# Patient Record
Sex: Female | Born: 1935 | Race: White | Hispanic: No | Smoking: Former smoker
Health system: Southern US, Community
[De-identification: ages and names within clinical notes are randomized; demographics above are authoritative.]

## PROBLEM LIST (undated history)

## (undated) DIAGNOSIS — G44009 Cluster headache syndrome, unspecified, not intractable: Secondary | ICD-10-CM

## (undated) DIAGNOSIS — M1990 Unspecified osteoarthritis, unspecified site: Secondary | ICD-10-CM

## (undated) DIAGNOSIS — I10 Essential (primary) hypertension: Secondary | ICD-10-CM

## (undated) DIAGNOSIS — C50919 Malignant neoplasm of unspecified site of unspecified female breast: Secondary | ICD-10-CM

## (undated) DIAGNOSIS — E785 Hyperlipidemia, unspecified: Secondary | ICD-10-CM

## (undated) DIAGNOSIS — M545 Low back pain, unspecified: Secondary | ICD-10-CM

## (undated) DIAGNOSIS — E669 Obesity, unspecified: Secondary | ICD-10-CM

## (undated) DIAGNOSIS — M4800 Spinal stenosis, site unspecified: Secondary | ICD-10-CM

## (undated) DIAGNOSIS — F329 Major depressive disorder, single episode, unspecified: Secondary | ICD-10-CM

## (undated) DIAGNOSIS — M8580 Other specified disorders of bone density and structure, unspecified site: Secondary | ICD-10-CM

## (undated) DIAGNOSIS — Z9181 History of falling: Secondary | ICD-10-CM

## (undated) DIAGNOSIS — M797 Fibromyalgia: Secondary | ICD-10-CM

## (undated) DIAGNOSIS — N189 Chronic kidney disease, unspecified: Secondary | ICD-10-CM

## (undated) DIAGNOSIS — K589 Irritable bowel syndrome without diarrhea: Secondary | ICD-10-CM

## (undated) DIAGNOSIS — I350 Nonrheumatic aortic (valve) stenosis: Secondary | ICD-10-CM

## (undated) DIAGNOSIS — D649 Anemia, unspecified: Secondary | ICD-10-CM

## (undated) DIAGNOSIS — F32A Depression, unspecified: Secondary | ICD-10-CM

## (undated) DIAGNOSIS — E538 Deficiency of other specified B group vitamins: Secondary | ICD-10-CM

## (undated) DIAGNOSIS — I679 Cerebrovascular disease, unspecified: Secondary | ICD-10-CM

## (undated) DIAGNOSIS — I4892 Unspecified atrial flutter: Secondary | ICD-10-CM

## (undated) HISTORY — DX: Spinal stenosis, site unspecified: M48.00

## (undated) HISTORY — DX: Depression, unspecified: F32.A

## (undated) HISTORY — DX: Unspecified osteoarthritis, unspecified site: M19.90

## (undated) HISTORY — DX: Low back pain: M54.5

## (undated) HISTORY — DX: Cluster headache syndrome, unspecified, not intractable: G44.009

## (undated) HISTORY — DX: Major depressive disorder, single episode, unspecified: F32.9

## (undated) HISTORY — DX: History of falling: Z91.81

## (undated) HISTORY — PX: APPENDECTOMY: SHX54

## (undated) HISTORY — DX: Fibromyalgia: M79.7

## (undated) HISTORY — DX: Unspecified atrial flutter: I48.92

## (undated) HISTORY — DX: Obesity, unspecified: E66.9

## (undated) HISTORY — DX: Malignant neoplasm of unspecified site of unspecified female breast: C50.919

## (undated) HISTORY — DX: Deficiency of other specified B group vitamins: E53.8

## (undated) HISTORY — DX: Irritable bowel syndrome, unspecified: K58.9

## (undated) HISTORY — PX: CHOLECYSTECTOMY: SHX55

## (undated) HISTORY — DX: Cerebrovascular disease, unspecified: I67.9

## (undated) HISTORY — DX: Hyperlipidemia, unspecified: E78.5

## (undated) HISTORY — DX: Essential (primary) hypertension: I10

## (undated) HISTORY — DX: Chronic kidney disease, unspecified: N18.9

## (undated) HISTORY — PX: CENTRAL VENOUS CATHETER TUNNELED INSERTION SINGLE LUMEN: SHX1325

## (undated) HISTORY — DX: Anemia, unspecified: D64.9

## (undated) HISTORY — PX: LUMBAR DISC SURGERY: SHX700

## (undated) HISTORY — DX: Nonrheumatic aortic (valve) stenosis: I35.0

## (undated) HISTORY — DX: Low back pain, unspecified: M54.50

## (undated) HISTORY — PX: CATARACT EXTRACTION, BILATERAL: SHX1313

## (undated) HISTORY — DX: Other specified disorders of bone density and structure, unspecified site: M85.80

## (undated) HISTORY — PX: MASTECTOMY: SHX3

---

## 2000-12-03 DIAGNOSIS — I4892 Unspecified atrial flutter: Secondary | ICD-10-CM

## 2000-12-03 HISTORY — DX: Unspecified atrial flutter: I48.92

## 2001-09-04 ENCOUNTER — Encounter (HOSPITAL_COMMUNITY): Admission: RE | Admit: 2001-09-04 | Discharge: 2001-10-04 | Payer: Self-pay | Admitting: Rheumatology

## 2001-11-04 ENCOUNTER — Encounter: Payer: Self-pay | Admitting: Internal Medicine

## 2001-11-04 ENCOUNTER — Ambulatory Visit (HOSPITAL_COMMUNITY): Admission: RE | Admit: 2001-11-04 | Discharge: 2001-11-04 | Payer: Self-pay | Admitting: Internal Medicine

## 2001-11-14 ENCOUNTER — Encounter: Payer: Self-pay | Admitting: Internal Medicine

## 2001-11-14 ENCOUNTER — Ambulatory Visit (HOSPITAL_COMMUNITY): Admission: RE | Admit: 2001-11-14 | Discharge: 2001-11-14 | Payer: Self-pay | Admitting: Internal Medicine

## 2001-11-17 ENCOUNTER — Encounter: Payer: Self-pay | Admitting: Internal Medicine

## 2001-11-17 ENCOUNTER — Ambulatory Visit (HOSPITAL_COMMUNITY): Admission: RE | Admit: 2001-11-17 | Discharge: 2001-11-17 | Payer: Self-pay | Admitting: Internal Medicine

## 2002-08-12 ENCOUNTER — Ambulatory Visit (HOSPITAL_COMMUNITY): Admission: RE | Admit: 2002-08-12 | Discharge: 2002-08-12 | Payer: Self-pay | Admitting: Dermatology

## 2003-07-09 ENCOUNTER — Encounter: Payer: Self-pay | Admitting: Internal Medicine

## 2003-07-09 ENCOUNTER — Ambulatory Visit (HOSPITAL_COMMUNITY): Admission: RE | Admit: 2003-07-09 | Discharge: 2003-07-09 | Payer: Self-pay | Admitting: Pulmonary Disease

## 2003-08-08 ENCOUNTER — Emergency Department (HOSPITAL_COMMUNITY): Admission: EM | Admit: 2003-08-08 | Discharge: 2003-08-08 | Payer: Self-pay | Admitting: Emergency Medicine

## 2003-08-16 ENCOUNTER — Encounter: Payer: Self-pay | Admitting: Emergency Medicine

## 2003-08-16 ENCOUNTER — Emergency Department (HOSPITAL_COMMUNITY): Admission: EM | Admit: 2003-08-16 | Discharge: 2003-08-16 | Payer: Self-pay | Admitting: Emergency Medicine

## 2003-08-17 ENCOUNTER — Inpatient Hospital Stay (HOSPITAL_COMMUNITY): Admission: AD | Admit: 2003-08-17 | Discharge: 2003-08-21 | Payer: Self-pay | Admitting: Family Medicine

## 2003-08-17 ENCOUNTER — Encounter: Payer: Self-pay | Admitting: Family Medicine

## 2003-12-17 ENCOUNTER — Ambulatory Visit (HOSPITAL_COMMUNITY): Admission: RE | Admit: 2003-12-17 | Discharge: 2003-12-17 | Payer: Self-pay | Admitting: Internal Medicine

## 2004-02-02 ENCOUNTER — Encounter: Admission: RE | Admit: 2004-02-02 | Discharge: 2004-02-02 | Payer: Self-pay | Admitting: Orthopedic Surgery

## 2004-02-17 ENCOUNTER — Encounter: Admission: RE | Admit: 2004-02-17 | Discharge: 2004-02-17 | Payer: Self-pay | Admitting: Orthopedic Surgery

## 2004-03-22 ENCOUNTER — Encounter: Admission: RE | Admit: 2004-03-22 | Discharge: 2004-03-22 | Payer: Self-pay | Admitting: Orthopedic Surgery

## 2004-05-31 ENCOUNTER — Inpatient Hospital Stay (HOSPITAL_COMMUNITY): Admission: RE | Admit: 2004-05-31 | Discharge: 2004-06-08 | Payer: Self-pay | Admitting: Neurosurgery

## 2004-07-25 ENCOUNTER — Encounter: Admission: RE | Admit: 2004-07-25 | Discharge: 2004-07-25 | Payer: Self-pay | Admitting: Neurosurgery

## 2004-08-22 ENCOUNTER — Encounter: Admission: RE | Admit: 2004-08-22 | Discharge: 2004-08-22 | Payer: Self-pay | Admitting: Neurosurgery

## 2004-10-23 ENCOUNTER — Ambulatory Visit: Payer: Self-pay | Admitting: Orthopedic Surgery

## 2004-11-21 ENCOUNTER — Encounter: Admission: RE | Admit: 2004-11-21 | Discharge: 2004-11-21 | Payer: Self-pay | Admitting: Neurosurgery

## 2005-04-19 ENCOUNTER — Ambulatory Visit (HOSPITAL_COMMUNITY): Admission: RE | Admit: 2005-04-19 | Discharge: 2005-04-19 | Payer: Self-pay | Admitting: Internal Medicine

## 2005-08-15 ENCOUNTER — Inpatient Hospital Stay (HOSPITAL_COMMUNITY): Admission: RE | Admit: 2005-08-15 | Discharge: 2005-08-23 | Payer: Self-pay | Admitting: Neurosurgery

## 2005-08-15 ENCOUNTER — Ambulatory Visit: Payer: Self-pay | Admitting: Physical Medicine & Rehabilitation

## 2005-08-25 ENCOUNTER — Emergency Department (HOSPITAL_COMMUNITY): Admission: EM | Admit: 2005-08-25 | Discharge: 2005-08-25 | Payer: Self-pay | Admitting: *Deleted

## 2005-12-17 ENCOUNTER — Ambulatory Visit (HOSPITAL_COMMUNITY): Admission: RE | Admit: 2005-12-17 | Discharge: 2005-12-17 | Payer: Self-pay | Admitting: Internal Medicine

## 2006-10-30 ENCOUNTER — Ambulatory Visit (HOSPITAL_COMMUNITY): Admission: RE | Admit: 2006-10-30 | Discharge: 2006-10-30 | Payer: Self-pay | Admitting: Family Medicine

## 2006-11-15 ENCOUNTER — Ambulatory Visit (HOSPITAL_COMMUNITY): Payer: Self-pay | Admitting: Oncology

## 2006-11-15 ENCOUNTER — Encounter (HOSPITAL_COMMUNITY): Admission: RE | Admit: 2006-11-15 | Discharge: 2006-12-02 | Payer: Self-pay | Admitting: Oncology

## 2006-11-18 ENCOUNTER — Ambulatory Visit: Payer: Self-pay | Admitting: Internal Medicine

## 2006-11-20 ENCOUNTER — Ambulatory Visit (HOSPITAL_COMMUNITY): Admission: RE | Admit: 2006-11-20 | Discharge: 2006-11-20 | Payer: Self-pay | Admitting: Oncology

## 2006-11-27 ENCOUNTER — Ambulatory Visit (HOSPITAL_COMMUNITY): Admission: RE | Admit: 2006-11-27 | Discharge: 2006-11-27 | Payer: Self-pay | Admitting: General Surgery

## 2006-12-07 ENCOUNTER — Ambulatory Visit: Payer: Self-pay | Admitting: Cardiology

## 2006-12-07 ENCOUNTER — Inpatient Hospital Stay (HOSPITAL_COMMUNITY): Admission: EM | Admit: 2006-12-07 | Discharge: 2006-12-10 | Payer: Self-pay | Admitting: Emergency Medicine

## 2006-12-12 ENCOUNTER — Encounter (HOSPITAL_COMMUNITY): Admission: RE | Admit: 2006-12-12 | Discharge: 2007-01-11 | Payer: Self-pay | Admitting: Cardiology

## 2006-12-12 ENCOUNTER — Ambulatory Visit: Payer: Self-pay | Admitting: Internal Medicine

## 2006-12-13 ENCOUNTER — Encounter (HOSPITAL_COMMUNITY): Admission: RE | Admit: 2006-12-13 | Discharge: 2007-01-12 | Payer: Self-pay | Admitting: Oncology

## 2007-01-04 ENCOUNTER — Emergency Department (HOSPITAL_COMMUNITY): Admission: EM | Admit: 2007-01-04 | Discharge: 2007-01-04 | Payer: Self-pay | Admitting: Emergency Medicine

## 2007-01-17 ENCOUNTER — Encounter (HOSPITAL_COMMUNITY): Admission: RE | Admit: 2007-01-17 | Discharge: 2007-02-16 | Payer: Self-pay | Admitting: Oncology

## 2007-01-17 ENCOUNTER — Ambulatory Visit (HOSPITAL_COMMUNITY): Payer: Self-pay | Admitting: Oncology

## 2007-02-03 ENCOUNTER — Ambulatory Visit: Payer: Self-pay | Admitting: Cardiovascular Disease

## 2007-02-19 ENCOUNTER — Encounter (HOSPITAL_COMMUNITY): Admission: RE | Admit: 2007-02-19 | Discharge: 2007-03-21 | Payer: Self-pay | Admitting: Oncology

## 2007-03-24 ENCOUNTER — Ambulatory Visit (HOSPITAL_COMMUNITY): Payer: Self-pay | Admitting: Oncology

## 2007-03-24 ENCOUNTER — Encounter (HOSPITAL_COMMUNITY): Admission: RE | Admit: 2007-03-24 | Discharge: 2007-04-23 | Payer: Self-pay | Admitting: Oncology

## 2007-04-03 ENCOUNTER — Inpatient Hospital Stay (HOSPITAL_COMMUNITY): Admission: EM | Admit: 2007-04-03 | Discharge: 2007-04-08 | Payer: Self-pay | Admitting: Emergency Medicine

## 2007-04-03 ENCOUNTER — Ambulatory Visit: Payer: Self-pay | Admitting: Gastroenterology

## 2007-04-05 ENCOUNTER — Ambulatory Visit: Payer: Self-pay | Admitting: Internal Medicine

## 2007-04-29 ENCOUNTER — Encounter (HOSPITAL_COMMUNITY): Admission: RE | Admit: 2007-04-29 | Discharge: 2007-05-29 | Payer: Self-pay | Admitting: Oncology

## 2007-04-30 ENCOUNTER — Ambulatory Visit (HOSPITAL_COMMUNITY): Admission: RE | Admit: 2007-04-30 | Discharge: 2007-04-30 | Payer: Self-pay | Admitting: General Surgery

## 2007-05-07 ENCOUNTER — Inpatient Hospital Stay (HOSPITAL_COMMUNITY): Admission: RE | Admit: 2007-05-07 | Discharge: 2007-05-10 | Payer: Self-pay | Admitting: General Surgery

## 2007-05-07 ENCOUNTER — Encounter (INDEPENDENT_AMBULATORY_CARE_PROVIDER_SITE_OTHER): Payer: Self-pay | Admitting: General Surgery

## 2007-05-22 ENCOUNTER — Ambulatory Visit (HOSPITAL_COMMUNITY): Payer: Self-pay | Admitting: Oncology

## 2007-06-05 ENCOUNTER — Encounter (HOSPITAL_COMMUNITY): Admission: RE | Admit: 2007-06-05 | Discharge: 2007-07-05 | Payer: Self-pay | Admitting: Oncology

## 2007-07-07 ENCOUNTER — Encounter (HOSPITAL_COMMUNITY): Admission: RE | Admit: 2007-07-07 | Discharge: 2007-08-06 | Payer: Self-pay | Admitting: Oncology

## 2007-07-07 ENCOUNTER — Ambulatory Visit: Payer: Self-pay | Admitting: Cardiology

## 2007-07-08 ENCOUNTER — Ambulatory Visit (HOSPITAL_COMMUNITY): Admission: RE | Admit: 2007-07-08 | Discharge: 2007-07-08 | Payer: Self-pay | Admitting: Oncology

## 2007-07-10 ENCOUNTER — Ambulatory Visit (HOSPITAL_COMMUNITY): Payer: Self-pay | Admitting: Oncology

## 2007-08-12 ENCOUNTER — Ambulatory Visit (HOSPITAL_COMMUNITY): Admission: RE | Admit: 2007-08-12 | Discharge: 2007-08-12 | Payer: Self-pay | Admitting: Oncology

## 2007-08-29 ENCOUNTER — Ambulatory Visit (HOSPITAL_COMMUNITY): Payer: Self-pay | Admitting: Oncology

## 2007-09-05 ENCOUNTER — Encounter (HOSPITAL_COMMUNITY): Admission: RE | Admit: 2007-09-05 | Discharge: 2007-10-05 | Payer: Self-pay | Admitting: Oncology

## 2007-10-07 ENCOUNTER — Encounter (HOSPITAL_COMMUNITY): Admission: RE | Admit: 2007-10-07 | Discharge: 2007-11-06 | Payer: Self-pay | Admitting: Oncology

## 2007-10-07 ENCOUNTER — Ambulatory Visit: Payer: Self-pay | Admitting: Cardiology

## 2007-10-16 ENCOUNTER — Ambulatory Visit (HOSPITAL_COMMUNITY): Payer: Self-pay | Admitting: Oncology

## 2007-11-07 ENCOUNTER — Encounter (HOSPITAL_COMMUNITY): Admission: RE | Admit: 2007-11-07 | Discharge: 2007-12-03 | Payer: Self-pay | Admitting: Oncology

## 2007-12-08 ENCOUNTER — Ambulatory Visit (HOSPITAL_COMMUNITY): Payer: Self-pay | Admitting: Oncology

## 2007-12-08 ENCOUNTER — Encounter (HOSPITAL_COMMUNITY): Admission: RE | Admit: 2007-12-08 | Discharge: 2008-01-07 | Payer: Self-pay | Admitting: Oncology

## 2007-12-22 ENCOUNTER — Ambulatory Visit: Payer: Self-pay | Admitting: Cardiology

## 2008-01-08 ENCOUNTER — Encounter (HOSPITAL_COMMUNITY): Admission: RE | Admit: 2008-01-08 | Discharge: 2008-02-07 | Payer: Self-pay | Admitting: Oncology

## 2008-01-22 ENCOUNTER — Ambulatory Visit (HOSPITAL_COMMUNITY): Payer: Self-pay | Admitting: Oncology

## 2008-02-10 ENCOUNTER — Ambulatory Visit: Payer: Self-pay | Admitting: Cardiology

## 2008-02-10 ENCOUNTER — Encounter (HOSPITAL_COMMUNITY): Admission: RE | Admit: 2008-02-10 | Discharge: 2008-03-11 | Payer: Self-pay | Admitting: Oncology

## 2008-03-11 ENCOUNTER — Ambulatory Visit (HOSPITAL_COMMUNITY): Payer: Self-pay | Admitting: Oncology

## 2008-06-09 ENCOUNTER — Ambulatory Visit (HOSPITAL_COMMUNITY): Payer: Self-pay | Admitting: Oncology

## 2008-06-09 ENCOUNTER — Encounter (HOSPITAL_COMMUNITY): Admission: RE | Admit: 2008-06-09 | Discharge: 2008-07-09 | Payer: Self-pay | Admitting: Oncology

## 2008-08-26 ENCOUNTER — Emergency Department (HOSPITAL_COMMUNITY): Admission: EM | Admit: 2008-08-26 | Discharge: 2008-08-26 | Payer: Self-pay | Admitting: Emergency Medicine

## 2008-10-21 ENCOUNTER — Ambulatory Visit (HOSPITAL_COMMUNITY): Payer: Self-pay | Admitting: Oncology

## 2008-12-02 ENCOUNTER — Encounter (HOSPITAL_COMMUNITY): Admission: RE | Admit: 2008-12-02 | Discharge: 2009-01-01 | Payer: Self-pay | Admitting: Oncology

## 2008-12-07 ENCOUNTER — Ambulatory Visit (HOSPITAL_COMMUNITY): Payer: Self-pay | Admitting: Oncology

## 2009-03-07 ENCOUNTER — Ambulatory Visit (HOSPITAL_COMMUNITY): Payer: Self-pay | Admitting: Oncology

## 2009-03-30 ENCOUNTER — Encounter: Admission: RE | Admit: 2009-03-30 | Discharge: 2009-03-30 | Payer: Self-pay | Admitting: General Surgery

## 2009-04-19 ENCOUNTER — Encounter (INDEPENDENT_AMBULATORY_CARE_PROVIDER_SITE_OTHER): Payer: Self-pay | Admitting: *Deleted

## 2009-04-19 LAB — CONVERTED CEMR LAB
ALT: 10 units/L
ALT: 10 units/L
AST: 14 units/L
Albumin: 4.1 g/dL
Alkaline Phosphatase: 58 units/L
BUN: 24 mg/dL
CO2: 24 meq/L
CO2: 24 meq/L
Chloride: 101 meq/L
Cholesterol: 258 mg/dL
Cholesterol: 258 mg/dL
Creatinine, Ser: 1.46 mg/dL
Creatinine, Ser: 1.46 mg/dL
Glucose, Bld: 273 mg/dL
Glucose, Bld: 273 mg/dL
HCT: 36.9 %
HDL: 49 mg/dL
Hgb A1c MFr Bld: 7.9 %
Hgb A1c MFr Bld: 7.9 %
Platelets: 199 10*3/uL
Potassium: 4.7 meq/L
TSH: 2.775 microintl units/mL
Total Protein: 7.5 g/dL
Triglycerides: 502 mg/dL

## 2009-04-27 ENCOUNTER — Emergency Department (HOSPITAL_COMMUNITY): Admission: EM | Admit: 2009-04-27 | Discharge: 2009-04-27 | Payer: Self-pay | Admitting: Emergency Medicine

## 2009-05-12 ENCOUNTER — Ambulatory Visit (HOSPITAL_COMMUNITY): Payer: Self-pay | Admitting: Oncology

## 2009-07-04 ENCOUNTER — Ambulatory Visit (HOSPITAL_COMMUNITY): Payer: Self-pay | Admitting: Oncology

## 2009-08-19 ENCOUNTER — Encounter (INDEPENDENT_AMBULATORY_CARE_PROVIDER_SITE_OTHER): Payer: Self-pay | Admitting: *Deleted

## 2009-08-19 ENCOUNTER — Encounter: Payer: Self-pay | Admitting: Cardiology

## 2009-08-19 LAB — CONVERTED CEMR LAB
BUN: 20 mg/dL
CO2: 23 meq/L

## 2009-10-12 ENCOUNTER — Encounter (INDEPENDENT_AMBULATORY_CARE_PROVIDER_SITE_OTHER): Payer: Self-pay | Admitting: *Deleted

## 2009-10-12 ENCOUNTER — Ambulatory Visit: Payer: Self-pay | Admitting: Cardiology

## 2009-10-12 DIAGNOSIS — N184 Chronic kidney disease, stage 4 (severe): Secondary | ICD-10-CM | POA: Insufficient documentation

## 2009-10-12 DIAGNOSIS — I129 Hypertensive chronic kidney disease with stage 1 through stage 4 chronic kidney disease, or unspecified chronic kidney disease: Secondary | ICD-10-CM

## 2009-10-12 DIAGNOSIS — M48061 Spinal stenosis, lumbar region without neurogenic claudication: Secondary | ICD-10-CM | POA: Insufficient documentation

## 2009-10-12 DIAGNOSIS — E1122 Type 2 diabetes mellitus with diabetic chronic kidney disease: Secondary | ICD-10-CM | POA: Insufficient documentation

## 2009-10-12 DIAGNOSIS — F341 Dysthymic disorder: Secondary | ICD-10-CM | POA: Insufficient documentation

## 2009-10-14 ENCOUNTER — Telehealth (INDEPENDENT_AMBULATORY_CARE_PROVIDER_SITE_OTHER): Payer: Self-pay | Admitting: *Deleted

## 2009-10-18 ENCOUNTER — Ambulatory Visit (HOSPITAL_COMMUNITY): Admission: RE | Admit: 2009-10-18 | Discharge: 2009-10-18 | Payer: Self-pay | Admitting: Cardiology

## 2009-10-20 ENCOUNTER — Encounter (INDEPENDENT_AMBULATORY_CARE_PROVIDER_SITE_OTHER): Payer: Self-pay | Admitting: *Deleted

## 2009-10-24 ENCOUNTER — Ambulatory Visit: Payer: Self-pay | Admitting: Cardiology

## 2009-10-24 ENCOUNTER — Ambulatory Visit (HOSPITAL_COMMUNITY): Payer: Self-pay | Admitting: Oncology

## 2009-11-11 ENCOUNTER — Encounter (INDEPENDENT_AMBULATORY_CARE_PROVIDER_SITE_OTHER): Payer: Self-pay | Admitting: *Deleted

## 2009-11-15 ENCOUNTER — Ambulatory Visit: Payer: Self-pay | Admitting: Cardiology

## 2009-11-15 ENCOUNTER — Encounter (INDEPENDENT_AMBULATORY_CARE_PROVIDER_SITE_OTHER): Payer: Self-pay | Admitting: *Deleted

## 2009-11-15 DIAGNOSIS — E785 Hyperlipidemia, unspecified: Secondary | ICD-10-CM | POA: Insufficient documentation

## 2009-11-17 ENCOUNTER — Encounter: Payer: Self-pay | Admitting: Cardiology

## 2009-12-20 ENCOUNTER — Encounter: Payer: Self-pay | Admitting: Cardiology

## 2010-01-16 ENCOUNTER — Ambulatory Visit (HOSPITAL_COMMUNITY): Payer: Self-pay | Admitting: Oncology

## 2010-01-16 ENCOUNTER — Encounter (HOSPITAL_COMMUNITY): Admission: RE | Admit: 2010-01-16 | Discharge: 2010-02-15 | Payer: Self-pay | Admitting: Oncology

## 2010-01-31 DIAGNOSIS — Z9181 History of falling: Secondary | ICD-10-CM

## 2010-01-31 HISTORY — DX: History of falling: Z91.81

## 2010-02-04 ENCOUNTER — Inpatient Hospital Stay (HOSPITAL_COMMUNITY): Admission: AC | Admit: 2010-02-04 | Discharge: 2010-02-13 | Payer: Self-pay

## 2010-02-07 ENCOUNTER — Ambulatory Visit: Payer: Self-pay | Admitting: Physical Medicine & Rehabilitation

## 2010-02-13 ENCOUNTER — Encounter (INDEPENDENT_AMBULATORY_CARE_PROVIDER_SITE_OTHER): Payer: Self-pay | Admitting: *Deleted

## 2010-02-14 ENCOUNTER — Encounter (INDEPENDENT_AMBULATORY_CARE_PROVIDER_SITE_OTHER): Payer: Self-pay | Admitting: *Deleted

## 2010-02-17 ENCOUNTER — Telehealth (INDEPENDENT_AMBULATORY_CARE_PROVIDER_SITE_OTHER): Payer: Self-pay

## 2010-02-23 ENCOUNTER — Ambulatory Visit (HOSPITAL_COMMUNITY): Admission: RE | Admit: 2010-02-23 | Discharge: 2010-02-23 | Payer: Self-pay | Admitting: Internal Medicine

## 2010-02-24 ENCOUNTER — Emergency Department (HOSPITAL_COMMUNITY): Admission: EM | Admit: 2010-02-24 | Discharge: 2010-02-24 | Payer: Self-pay | Admitting: Emergency Medicine

## 2010-04-07 ENCOUNTER — Encounter (INDEPENDENT_AMBULATORY_CARE_PROVIDER_SITE_OTHER): Payer: Self-pay | Admitting: *Deleted

## 2010-04-07 LAB — CONVERTED CEMR LAB
ALT: 12 units/L
Albumin: 3.8 g/dL
Alkaline Phosphatase: 71 units/L
BUN: 39 mg/dL
CO2: 27 meq/L
Calcium: 9 mg/dL
Glomerular Filtration Rate, Af Am: 32 mL/min/{1.73_m2}
Hemoglobin, Urine: NEGATIVE
MCV: 86 fL
Nitrite: POSITIVE
Potassium: 4.5 meq/L
Total Protein: 6.4 g/dL
Urine Glucose: NEGATIVE mg/dL

## 2010-04-11 ENCOUNTER — Emergency Department (HOSPITAL_COMMUNITY): Admission: EM | Admit: 2010-04-11 | Discharge: 2010-04-11 | Payer: Self-pay | Admitting: Emergency Medicine

## 2010-04-12 ENCOUNTER — Telehealth (INDEPENDENT_AMBULATORY_CARE_PROVIDER_SITE_OTHER): Payer: Self-pay | Admitting: *Deleted

## 2010-04-13 ENCOUNTER — Ambulatory Visit: Payer: Self-pay | Admitting: Cardiology

## 2010-04-13 DIAGNOSIS — I951 Orthostatic hypotension: Secondary | ICD-10-CM | POA: Insufficient documentation

## 2010-04-14 ENCOUNTER — Encounter: Payer: Self-pay | Admitting: Cardiology

## 2010-04-14 ENCOUNTER — Encounter: Payer: Self-pay | Admitting: Adult Health

## 2010-04-17 ENCOUNTER — Encounter (INDEPENDENT_AMBULATORY_CARE_PROVIDER_SITE_OTHER): Payer: Self-pay | Admitting: *Deleted

## 2010-04-18 ENCOUNTER — Ambulatory Visit: Payer: Self-pay | Admitting: Cardiology

## 2010-04-18 ENCOUNTER — Encounter: Payer: Self-pay | Admitting: Adult Health

## 2010-04-18 ENCOUNTER — Ambulatory Visit (HOSPITAL_COMMUNITY): Admission: RE | Admit: 2010-04-18 | Discharge: 2010-04-18 | Payer: Self-pay | Admitting: Cardiology

## 2010-04-18 ENCOUNTER — Encounter: Payer: Self-pay | Admitting: Cardiology

## 2010-04-18 LAB — CONVERTED CEMR LAB
BUN: 81 mg/dL — ABNORMAL HIGH (ref 6–23)
CO2: 23 meq/L (ref 19–32)
Calcium: 9.2 mg/dL (ref 8.4–10.5)
Chloride: 106 meq/L (ref 96–112)
Creatinine, Ser: 3.18 mg/dL — ABNORMAL HIGH (ref 0.40–1.50)
Potassium: 5.2 meq/L (ref 3.5–5.3)
Pro B Natriuretic peptide (BNP): 1099.8 pg/mL — ABNORMAL HIGH (ref 0.0–100.0)

## 2010-04-19 ENCOUNTER — Telehealth: Payer: Self-pay | Admitting: Adult Health

## 2010-04-20 ENCOUNTER — Encounter (INDEPENDENT_AMBULATORY_CARE_PROVIDER_SITE_OTHER): Payer: Self-pay | Admitting: *Deleted

## 2010-04-24 ENCOUNTER — Ambulatory Visit (HOSPITAL_COMMUNITY): Admission: RE | Admit: 2010-04-24 | Discharge: 2010-04-24 | Payer: Self-pay | Admitting: Diagnostic Radiology

## 2010-04-25 ENCOUNTER — Ambulatory Visit: Payer: Self-pay | Admitting: Cardiology

## 2010-04-25 ENCOUNTER — Encounter (INDEPENDENT_AMBULATORY_CARE_PROVIDER_SITE_OTHER): Payer: Self-pay | Admitting: *Deleted

## 2010-04-25 LAB — CONVERTED CEMR LAB
BUN: 98 mg/dL
BUN: 98 mg/dL — ABNORMAL HIGH (ref 6–23)
Basophils Absolute: 0 10*3/uL (ref 0.0–0.1)
Basophils Relative: 0 %
Basophils Relative: 0 % (ref 0–1)
CO2: 25 meq/L (ref 19–32)
Calcium: 9.4 mg/dL
Chloride: 103 meq/L
Creatinine, Ser: 2.98 mg/dL
Eosinophils Absolute: 0.2 10*3/uL
Eosinophils Relative: 2 % (ref 0–5)
HCT: 32.9 % — ABNORMAL LOW (ref 39.0–52.0)
Lymphocytes Relative: 33 %
Lymphs Abs: 2.6 10*3/uL
Lymphs Abs: 2.6 10*3/uL (ref 0.7–4.0)
MCHC: 30.4 g/dL
MCV: 87.5 fL (ref 78.0–100.0)
Monocytes Absolute: 0.5 10*3/uL
Monocytes Relative: 6 %
Monocytes Relative: 6 % (ref 3–12)
Neutrophils Relative %: 58 % (ref 43–77)
Platelets: 225 10*3/uL
Platelets: 225 10*3/uL (ref 150–400)
Potassium: 5 meq/L
Potassium: 5 meq/L (ref 3.5–5.3)
RBC: 3.76 M/uL — ABNORMAL LOW (ref 4.22–5.81)
RDW: 14.2 %
Sodium: 140 meq/L
WBC: 7.7 10*3/uL
WBC: 7.7 10*3/uL (ref 4.0–10.5)

## 2010-04-27 ENCOUNTER — Encounter (INDEPENDENT_AMBULATORY_CARE_PROVIDER_SITE_OTHER): Payer: Self-pay | Admitting: *Deleted

## 2010-05-03 ENCOUNTER — Encounter (INDEPENDENT_AMBULATORY_CARE_PROVIDER_SITE_OTHER): Payer: Self-pay | Admitting: *Deleted

## 2010-05-03 LAB — CONVERTED CEMR LAB
Calcium: 9.6 mg/dL
Platelets: 219 10*3/uL
Sodium: 139 meq/L
Total Protein: 6.7 g/dL
WBC: 5.2 10*3/uL

## 2010-05-09 ENCOUNTER — Encounter (HOSPITAL_COMMUNITY): Admission: RE | Admit: 2010-05-09 | Discharge: 2010-08-07 | Payer: Self-pay | Admitting: Nephrology

## 2010-05-25 ENCOUNTER — Encounter (INDEPENDENT_AMBULATORY_CARE_PROVIDER_SITE_OTHER): Payer: Self-pay | Admitting: *Deleted

## 2010-05-25 ENCOUNTER — Encounter: Payer: Self-pay | Admitting: Cardiology

## 2010-06-08 ENCOUNTER — Encounter (INDEPENDENT_AMBULATORY_CARE_PROVIDER_SITE_OTHER): Payer: Self-pay | Admitting: *Deleted

## 2010-06-22 ENCOUNTER — Encounter (INDEPENDENT_AMBULATORY_CARE_PROVIDER_SITE_OTHER): Payer: Self-pay

## 2010-06-22 LAB — CONVERTED CEMR LAB
ALT: 17 units/L
BUN: 41 mg/dL
Calcium: 9.4 mg/dL
Chloride: 100 meq/L
Glucose, Bld: 127 mg/dL
Potassium: 4.9 meq/L
Total Protein: 7 g/dL

## 2010-07-11 ENCOUNTER — Encounter: Payer: Self-pay | Admitting: Adult Health

## 2010-07-14 ENCOUNTER — Ambulatory Visit: Payer: Self-pay | Admitting: Cardiology

## 2010-07-14 DIAGNOSIS — I4891 Unspecified atrial fibrillation: Secondary | ICD-10-CM | POA: Insufficient documentation

## 2010-07-17 ENCOUNTER — Encounter (INDEPENDENT_AMBULATORY_CARE_PROVIDER_SITE_OTHER): Payer: Self-pay

## 2010-07-17 ENCOUNTER — Ambulatory Visit (HOSPITAL_COMMUNITY): Payer: Self-pay | Admitting: Oncology

## 2010-07-17 LAB — CONVERTED CEMR LAB
ALT: 14 units/L
AST: 21 units/L
Albumin: 4.3 g/dL
Calcium: 9.5 mg/dL
Chloride: 107 meq/L
MCV: 89.5 fL
Platelets: 206 10*3/uL
Potassium: 4.8 meq/L
Sodium: 144 meq/L

## 2010-07-18 LAB — CONVERTED CEMR LAB
Hgb A1c MFr Bld: 8.4 % — ABNORMAL HIGH (ref <5.7)
Magnesium: 1.5 mg/dL (ref 1.5–2.5)
Pro B Natriuretic peptide (BNP): 210.8 pg/mL — ABNORMAL HIGH (ref 0.0–100.0)

## 2010-08-14 ENCOUNTER — Ambulatory Visit: Payer: Self-pay | Admitting: Cardiology

## 2010-08-17 ENCOUNTER — Encounter (INDEPENDENT_AMBULATORY_CARE_PROVIDER_SITE_OTHER): Payer: Self-pay | Admitting: *Deleted

## 2010-08-17 LAB — CONVERTED CEMR LAB: OCCULT 1: NEGATIVE

## 2010-08-21 ENCOUNTER — Encounter (INDEPENDENT_AMBULATORY_CARE_PROVIDER_SITE_OTHER): Payer: Self-pay | Admitting: *Deleted

## 2010-09-09 ENCOUNTER — Emergency Department (HOSPITAL_COMMUNITY): Admission: EM | Admit: 2010-09-09 | Discharge: 2010-09-09 | Payer: Self-pay | Admitting: Emergency Medicine

## 2010-10-04 ENCOUNTER — Ambulatory Visit: Payer: Self-pay | Admitting: Cardiology

## 2010-10-05 ENCOUNTER — Encounter: Payer: Self-pay | Admitting: Cardiology

## 2010-10-09 ENCOUNTER — Encounter: Payer: Self-pay | Admitting: Cardiology

## 2010-10-09 ENCOUNTER — Ambulatory Visit (HOSPITAL_COMMUNITY): Admission: RE | Admit: 2010-10-09 | Discharge: 2010-10-09 | Payer: Self-pay | Admitting: Cardiology

## 2010-10-12 ENCOUNTER — Ambulatory Visit (HOSPITAL_COMMUNITY): Payer: Self-pay | Admitting: Oncology

## 2010-10-12 ENCOUNTER — Encounter (HOSPITAL_COMMUNITY)
Admission: RE | Admit: 2010-10-12 | Discharge: 2010-11-11 | Payer: Self-pay | Source: Home / Self Care | Attending: Oncology | Admitting: Oncology

## 2010-10-19 ENCOUNTER — Encounter (INDEPENDENT_AMBULATORY_CARE_PROVIDER_SITE_OTHER): Payer: Self-pay | Admitting: *Deleted

## 2010-10-19 LAB — CONVERTED CEMR LAB
LDL Cholesterol: 130 mg/dL — ABNORMAL HIGH (ref 0–99)
Total CHOL/HDL Ratio: 4
Triglycerides: 257 mg/dL — ABNORMAL HIGH (ref <150)
VLDL: 51 mg/dL — ABNORMAL HIGH (ref 0–40)

## 2010-11-23 ENCOUNTER — Encounter (HOSPITAL_COMMUNITY)
Admission: RE | Admit: 2010-11-23 | Discharge: 2010-12-23 | Payer: Self-pay | Source: Home / Self Care | Attending: Oncology | Admitting: Oncology

## 2010-12-03 HISTORY — PX: COLONOSCOPY: SHX174

## 2010-12-23 ENCOUNTER — Encounter: Payer: Self-pay | Admitting: Orthopedic Surgery

## 2010-12-24 ENCOUNTER — Encounter: Payer: Self-pay | Admitting: Family Medicine

## 2010-12-24 ENCOUNTER — Encounter: Payer: Self-pay | Admitting: Internal Medicine

## 2010-12-24 ENCOUNTER — Encounter: Payer: Self-pay | Admitting: Diagnostic Radiology

## 2010-12-24 ENCOUNTER — Encounter (HOSPITAL_COMMUNITY): Payer: Self-pay | Admitting: Oncology

## 2010-12-26 ENCOUNTER — Encounter: Payer: Self-pay | Admitting: Cardiology

## 2011-01-01 ENCOUNTER — Encounter: Payer: Self-pay | Admitting: Cardiology

## 2011-01-02 NOTE — Letter (Signed)
Summary: [place] [facility] Work Doctor, general practice at [place]. [address], [place] [address]   Phone: [phone]  Fax: [phone]     February 14, 2010 MRN: [id number]   [place] [address] [place], [place]  [address]      YOUR LAB WORK IS DUE   ______________MARCH 18, 2011___________________________  Please go to [facility], located across the street from [facility] on the second floor.  Hours are Monday - Friday 7am until 7:30pm         Saturday 8am until 12noon    _X_  DO NOT EAT OR DRINK AFTER MIDNIGHT EVENING PRIOR TO LABWORK  __ YOUR LABWORK IS NOT FASTING --YOU MAY EAT PRIOR TO LABWORK

## 2011-01-02 NOTE — Letter (Signed)
Summary: Appointment - Missed  [facility] at [place]. [address] [place] [address]   Phone: [phone]  Fax: [phone]     February 13, 2010 MRN: [id number]   [place] [address] [place], [place]  [address]   Dear Ms. Mikkelsen,  Our records indicate you missed your appointment on 02/13/10 with Dr. Lattie Haw                                It is very important that we reach you to reschedule this appointment. We look forward to participating in your health care needs. Please contact us at the number listed above at your earliest convenience to reschedule this appointment.     Sincerely,    Public relations account executive

## 2011-01-02 NOTE — Letter (Signed)
Summary: [place] Results Doctor, general practice at [place]. [address], [place] [address]   Phone: [phone]  Fax: [phone]      Apr 27, 2010 MRN: [id number]   [place] [address] [place], [place]  [address]   Dear Ms. Sloop,  Your test ordered by Rande Lawman has been reviewed by your physician (or physician assistant) and was found to be normal or stable. Your physician (or physician assistant) felt no changes were needed at this time.  ____ Echocardiogram  ____ Cardiac Stress Test  __x__ Lab Work  ____ Peripheral vascular study of arms, legs or neck  ____ CT scan or X-ray  ____ Lung or Breathing test  ____ Other:  No change in medical treatment at this time, per Jory Sims, NP.  Enclosed is a copy of your labwork for your records.  Thank you, Uday Jantz [facility]aird Cancer RN    Jacqulyn Ducking, MD, Leana Gamer.C.Renella Cunas, MD, F.A.C.C Cristopher Peru, MD, F.A.C.C Rozann Lesches, MD, F.A.C.C Jenkins Rouge, MD, Leana Gamer.C.C

## 2011-01-02 NOTE — Miscellaneous (Signed)
Summary: [facility] labs 05/03/2010  Clinical Lists Changes  Observations: Added new observation of BASOPHIL %: 0.0 % (05/03/2010 8:36) Added new observation of EOS ABSLT: 0.2 K/uL (05/03/2010 8:36) Added new observation of MONOCYTE %: 0.4 % (05/03/2010 8:36) Added new observation of ABS LYMPHOCY: 1.3 K/uL (05/03/2010 8:36) Added new observation of LYMPHS %: 25 % (05/03/2010 8:36) Added new observation of ABS NEUTROPH: 63 K/uL (05/03/2010 8:36) Added new observation of MCHC RBC: 3.79 g/dL (05/03/2010 8:36) Added new observation of CALCIUM: 9.6 mg/dL (05/03/2010 8:36) Added new observation of ALBUMIN: 4.0 g/dL (05/03/2010 8:36) Added new observation of PROTEIN, TOT: 6.7 g/dL (05/03/2010 8:36) Added new observation of SGPT (ALT): 13 units/L (05/03/2010 8:36) Added new observation of SGOT (AST): 18 units/L (05/03/2010 8:36) Added new observation of ALK PHOS: 50 units/L (05/03/2010 8:36) Added new observation of GFR AA: 19 mL/min/1.28m2 (05/03/2010 8:36) Added new observation of GFR: 16 mL/min (05/03/2010 8:36) Added new observation of CREATININE: 2.74 mg/dL (05/03/2010 8:36) Added new observation of BUN: 94 mg/dL (05/03/2010 8:36) Added new observation of BG RANDOM: 184 mg/dL (05/03/2010 8:36) Added new observation of CO2 PLSM/SER: 25 meq/L (05/03/2010 8:36) Added new observation of CL SERUM: 105 meq/L (05/03/2010 8:36) Added new observation of K SERUM: 5.0 meq/L (05/03/2010 8:36) Added new observation of NA: 139 meq/L (05/03/2010 8:36) Added new observation of PLATELETK/UL: 219 K/uL (05/03/2010 8:36) Added new observation of MCV: 84 fL (05/03/2010 8:36) Added new observation of HCT: 31.7 % (05/03/2010 8:36) Added new observation of HGB: 10.2 g/dL (05/03/2010 8:36) Added new observation of WBC COUNT: 5.2 10*3/microliter (05/03/2010 8:36)

## 2011-01-02 NOTE — Letter (Signed)
Summary: PROGRESS NOTE [facility] 04-07-10  PROGRESS NOTE [facility] 04-07-10   Imported By: Nevada Crane 2020-10-2310 09:56:49  _____________________________________________________________________  External Attachment:    Type:   Image     Comment:   External Document

## 2011-01-02 NOTE — Miscellaneous (Signed)
Summary: hemoccult cards 08/17/2010  Clinical Lists Changes  Observations: Added new observation of HEMOCCULT 3: neg (08/17/2010 8:59) Added new observation of HEMOCCULT 2: neg (08/17/2010 8:59) Added new observation of HEMOCCULT 1: neg (08/17/2010 8:59)

## 2011-01-02 NOTE — Miscellaneous (Signed)
Summary: CBC and CMP   Clinical Lists Changes  Observations: Added new observation of CALCIUM: 9.5 mg/dL (07/17/2010 16:16) Added new observation of ALBUMIN: 4.3 g/dL (07/17/2010 16:16) Added new observation of PROTEIN, TOT: 7.1 g/dL (07/17/2010 16:16) Added new observation of SGPT (ALT): 14 units/L (07/17/2010 16:16) Added new observation of SGOT (AST): 21 units/L (07/17/2010 16:16) Added new observation of ALK PHOS: 47 units/L (07/17/2010 16:16) Added new observation of BILI DIRECT: Bili Total: 0.3 mg/dL (07/17/2010 16:16) Added new observation of CREATININE: 1.80 mg/dL (07/17/2010 16:16) Added new observation of BUN: 28 mg/dL (07/17/2010 16:16) Added new observation of BG RANDOM: 105 mg/dL (07/17/2010 16:16) Added new observation of CO2 PLSM/SER: 25 meq/L (07/17/2010 16:16) Added new observation of CL SERUM: 107 meq/L (07/17/2010 16:16) Added new observation of K SERUM: 4.8 meq/L (07/17/2010 16:16) Added new observation of NA: 144 meq/L (07/17/2010 16:16) Added new observation of PLATELETK/UL: 206 K/uL (07/17/2010 16:16) Added new observation of MCV: 89.5 fL (07/17/2010 16:16) Added new observation of HCT: 39.4 % (07/17/2010 16:16) Added new observation of HGB: 12.1 g/dL (07/17/2010 16:16) Added new observation of WBC COUNT: 6.1 10*3/microliter (07/17/2010 16:16)

## 2011-01-02 NOTE — Miscellaneous (Signed)
  Clinical Lists Changes  Observations: Added new observation of BACTERIA URN: many (04/07/2010 16:28) Added new observation of RBCS MICRO U: none seen (04/07/2010 16:28) Added new observation of WBCS MICRO U: 0-5 (04/07/2010 16:28) Added new observation of CASTS URINE: present (04/07/2010 16:28) Added new observation of WBC UR: 0-5 (04/07/2010 16:28) Added new observation of NITRITE URN: pos (04/07/2010 16:28) Added new observation of PROTEIN UR: 1+ (04/07/2010 16:28) Added new observation of BLOOD UR: neg (04/07/2010 16:28) Added new observation of GLUCOSE UA: neg (04/07/2010 16:28) Added new observation of [place] URINE: 5.0  (04/07/2010 16:28) Added new observation of SPEC GR URIN: 1.025  (04/07/2010 16:28) Added new observation of MAGNESIUM: 2.1 mg/dL (04/07/2010 16:28) Added new observation of CALCIUM: 9.0 mg/dL (04/07/2010 16:28) Added new observation of ALBUMIN: 3.8 g/dL (04/07/2010 16:28) Added new observation of PROTEIN, TOT: 6.4 g/dL (04/07/2010 16:28) Added new observation of SGPT (ALT): 12 units/L (04/07/2010 16:28) Added new observation of SGOT (AST): 11 units/L (04/07/2010 16:28) Added new observation of ALK PHOS: 71 units/L (04/07/2010 16:28) Added new observation of BILI DIRECT: total bili   0.1 mg/dL (04/07/2010 16:28) Added new observation of GFR AA: 32 mL/min/1.72m2 (04/07/2010 16:28) Added new observation of GFR: 28 mL/min (04/07/2010 16:28) Added new observation of CREATININE: 1.79 mg/dL (04/07/2010 16:28) Added new observation of BUN: 39 mg/dL (04/07/2010 16:28) Added new observation of BG RANDOM: 209 mg/dL (04/07/2010 16:28) Added new observation of CO2 PLSM/SER: 27 meq/L (04/07/2010 16:28) Added new observation of CL SERUM: 108 meq/L (04/07/2010 16:28) Added new observation of K SERUM: 4.5 meq/L (04/07/2010 16:28) Added new observation of NA: 142 meq/L (04/07/2010 16:28) Added new observation of PLATELETK/UL: 271 K/uL (04/07/2010 16:28) Added new observation of  MCV: 86 fL (04/07/2010 16:28) Added new observation of HCT: 25.7 % (04/07/2010 16:28) Added new observation of HGB: 8.3 g/dL (04/07/2010 16:28) Added new observation of WBC COUNT: 5.3 10*3/microliter (04/07/2010 16:28)

## 2011-01-02 NOTE — Assessment & Plan Note (Addendum)
Summary: 3 mth fu from checkout on 07/14/2010/sn   Visit Type:  Follow-up Referring Provider:  [facility] Primary Provider:  Dr.Fanta   History of Present Illness: [name] returns to the office as scheduled for continuing assessment of a number of issues including a history of atrial flutter, exertional dyspnea, a history of aortic valve disease, hypertension and a history of congestive heart failure with preserved left ventricular systolic function.  Over the past few months, she has done generally well.  She was seen in the emergency department for an asymptomatic elevation of blood glucose to greater than 500.  Extra insulin was administered in the ED, but her daily treatment was not changed.  She has continuing class II dyspnea on exertion, but is able to do her housework and all of her usual activities.  She has had substantial psychologic stress in recent weeks due to family problems.  Her grandson and his family have been living in her house for a year, but have not been helpful nor have they contributed to the upkeep of the household.  Current Medications (verified): 1)  Diazepam 10 Mg Tabs (Diazepam) .... Take As Needed 2)  Paxil 40 Mg Tabs (Paroxetine Hcl) .... Take 1 Tab Daily 3)  Lorcet 10/650 10-650 Mg Tabs (Hydrocodone-Acetaminophen) .... Take As Needed For Pain 4)  Glipizide 10 Mg Tabs (Glipizide) .... Take 1 Tab Daily 5)  Lomotil 2.5-0.025 Mg Tabs (Diphenoxylate-Atropine) .... Take As Needed 6)  Lantus 100 Unit/ml Soln (Insulin Glargine) .... Sliding Scale 7)  Simvastatin 40 Mg Tabs (Simvastatin) .... Take One Tablet By Mouth Daily At Bedtime 8)  Metoprolol Tartrate 50 Mg Tabs (Metoprolol Tartrate) .... Take One Tablet By Mouth Twice A Day 9)  Fenofibrate 160 Mg Tabs (Fenofibrate) .... Take One Tablet By Mouth Daily With A Meal 10)  Novolin 70/30 70-30 % Susp (Insulin Isophane & Regular) .... Sliding Scale 11)  Aspir-Low 81 Mg Tbec (Aspirin) .... Take 1 Tab  Daily 12)  Daily Vites  Tabs (Multiple Vitamin) .... Take 1 Tab Daily 13)  Lyrica 50 Mg Caps (Pregabalin) .... Take 1 Tab Three Times A Day 14)  Ultram 50 Mg Tabs (Tramadol Hcl) .... Take As Directed 15)  Mag-Ox 400 400 Mg Tabs (Magnesium Oxide) .... Take 1 Tab Daily 16)  Furosemide 40 Mg Tabs (Furosemide) .... Take 1 Tablet By Mouth Two Times A Day 17)  Potassium Chloride Crys Cr 20 Meq Cr-Tabs (Potassium Chloride Crys Cr) .... Take 2 Tablets By Mouth By Mouth Once Daily 18)  Tears Naturale Ii  Soln (Artificial Tear Solution) .... As Needed 19)  Docusate Sodium 100 Mg Caps (Docusate Sodium) .... Take 1 Tab Daily 20)  Protonix 40 Mg Tbec (Pantoprazole Sodium) .... Take 2 Tabs Daily 21)  Proair Hfa 108 (90 Base) Mcg/act Aers (Albuterol Sulfate) .... 2 Puffs Q6hrs  Allergies (verified): 1)  ! * Imodium  Comments:  Nurse/Medical Assistant: patient brought med bottles and stated she takes as directed on the bottle  Past History:  PMH, FH, and Social History reviewed and updated.  Review of Systems  The patient denies weight loss, weight gain, chest pain, syncope, peripheral edema, prolonged cough, headaches, and abdominal pain.    Vital Signs:  Patient profile:   77 year old female Weight:      223 pounds Pulse rate:   65 / minute BP sitting:   149 / 73  (right arm)  Vitals Entered By: Lauren Sou, CNA (October 04, 2010 3:01  PM)   Impression & Recommendations:  Problem # 1:  ANTICOAGULATION (ICD-V58.61) Patient has had no documented atrial arrhythmias for nearly a decade and has been doing well on treatment with aspirin alone, which will be continued.  Problem # 2:  COMBINED HEART FAILURE, CHRONIC (ICD-428.42) Weight is stable.  BNP level was elevated to 210, which is nondiagnostic.  There appears to be no decompensation of CHF at the present time.  Problem # 3:  AORTIC STENOSIS-VERY MILD (ICD-424.1) Echocardiogram was requested at her last visit but never performed.   Study will be requested.  Problem # 4:  HYPERLIPIDEMIA (BMW-413.4) Lipid status was very poor when last assessed.  A repeat lipid profile was requested at her last visit, but never performed.  This study will be re-requested.  CHOL: 258 (04/19/2009)   LDL: not calc tri >400 (04/19/2009)   HDL: 49 (04/19/2009)   TG: 502 (04/19/2009)  Problem # 5:  HYPERTENSION (ICD-401.1) Repeat blood pressure was 135/70; current medication will be continued.  Problem # 6:  CHRONIC KIDNEY DISEASE STAGE 4 (ICD-585.2) Creatinine has improved from a baseline of approximately 2.5 to 1.8 3 months ago.  She continues to be followed by [facility].  Other Orders: T-Lipid Profile [phone]) T-Hgb A1C ([phone]) T-TSH [phone]) 2-D Echocardiogram (2D Echo)  Patient Instructions: 1)  Your physician recommends that you schedule a follow-up appointment in: 8 months 2)  Your physician recommends that you return for lab work in: This week 3)  Your physician recommends that you continue on your current medications as directed. Please refer to the Current Medication list given to you today. 4)  Your physician has requested that you have an echocardiogram.  Echocardiography is a painless test that uses sound waves to create images of your heart. It provides your doctor with information about the size and shape of your heart and how well your heart's chambers and valves are working.  This procedure takes approximately one hour. There are no restrictions for this procedure.  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 Justin Mend (12/26/2010 13:51) Added new observation of PRIMARY MD: Dr.Fanta (12/26/2010 13:51) Added new observation of ALBUMIN:  4.3 g/dL (10/24/2010 13:52) Added new observation of CALCIUM: 9 mg/dL (10/24/2010 13:52) Added new observation of CO2 PLSM/SER: 26 meq/L (10/24/2010 13:52) Added new observation of CL SERUM: 104 meq/L (10/24/2010 13:52) Added new observation of K SERUM: 4.9 meq/L (10/24/2010 13:52) Added new observation of NA: 141 meq/L (10/24/2010 13:52) Added new observation of CREATININE: 1.49 mg/dL (10/24/2010 13:52) Added new observation of BUN: 27 mg/dL (10/24/2010 13:52) Added new observation of BG RANDOM: 302 mg/dL (10/24/2010 13:52)        -  Date:  10/24/2010    BG  Random: 302    BUN: 27    Creatinine: 1.49    Sodium: 141    Potassium: 4.9    Chloride: 104    CO2 Total: 26    Calcium: 9    Albumin:  4.3   Past History:  Past Medical History: Aortic stenosis-mild AODM with neuropathy Chronic kidney disease: Creatinine-1.5 in 2010 and 2.5-3 in 2011; 1.5-10/2010; Klebsiella UTI-10/2010      urine protein 36 mg/dl, mildly elevated Hypertension Depression  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

## 2011-01-02 NOTE — Miscellaneous (Signed)
Summary: CBC and  CMP  Clinical Lists Changes  Observations: Added new observation of CALCIUM: 9.4 mg/dL (06/22/2010 16:22) Added new observation of ALBUMIN: 4.1 g/dL (06/22/2010 16:22) Added new observation of PROTEIN, TOT: 7.0 g/dL (06/22/2010 16:22) Added new observation of SGPT (ALT): 17 units/L (06/22/2010 16:22) Added new observation of SGOT (AST): 26 units/L (06/22/2010 16:22) Added new observation of ALK PHOS: 50 units/L (06/22/2010 16:22) Added new observation of BILI DIRECT: Bili Total: 0.2 mg/dL (06/22/2010 16:22) Added new observation of GFR AA: 28 mL/min/1.66m2 (06/22/2010 16:22) Added new observation of GFR: 25 mL/min (06/22/2010 16:22) Added new observation of CREATININE: 1.95 mg/dL (06/22/2010 16:22) Added new observation of BUN: 41 mg/dL (06/22/2010 16:22) Added new observation of BG RANDOM: 127 mg/dL (06/22/2010 16:22) Added new observation of CO2 PLSM/SER: 29 meq/L (06/22/2010 16:22) Added new observation of CL SERUM: 100 meq/L (06/22/2010 16:22) Added new observation of K SERUM: 4.9 meq/L (06/22/2010 16:22) Added new observation of NA: 139 meq/L (06/22/2010 16:22) Added new observation of PLATELETK/UL: 223 K/uL (06/22/2010 16:22) Added new observation of MCV: 82 fL (06/22/2010 16:22) Added new observation of HCT: 38.0 % (06/22/2010 16:22) Added new observation of HGB: 12.7 g/dL (06/22/2010 16:22) Added new observation of WBC COUNT: 9.1 10*3/microliter (06/22/2010 16:22)

## 2011-01-02 NOTE — Letter (Signed)
Summary: [place] Future [facility] Work Doctor, general practice at [place]. [address] [place] [address]   Phone: [phone]  Fax: [phone]     July 14, 2010 MRN: [id number]   [facility] [address] [place], [place]  [address]      YOUR LAB WORK IS DUE  September 11, 2010 _________________________________________  Please go to [facility], located across the street from [facility] on the second floor.  Hours are Monday - Friday 7am until 7:30pm         Saturday 8am until 12noon    __  DO NOT EAT OR DRINK AFTER MIDNIGHT EVENING PRIOR TO LABWORK  _X_ YOUR LABWORK IS NOT FASTING --YOU MAY EAT PRIOR TO LABWORK

## 2011-01-02 NOTE — Progress Notes (Signed)
Summary: ER visit  Phone Note Call from Patient Call back at [phone]   Caller: Other Relative (Mindy Nicki Reaper / daughter-in-law) Summary of Call: daughter-in-law states that pt was taken to ER last night/states that physician there told them to get in touch with Dr.Rothbart's office ASAP/pls call pt back @ above number/tg Initial call taken by: Alphonsus Sias [facility],  Apr 12, 2010 10:29 AM  Follow-up for Phone Call        S: pt sent to ED by Nephrology  B: pt has anemia, and chf A: BNP-- 2032,doesn't look like it was addressed in ED, only anemia was addressed R: pt not feeling well, to be seen, 04/13/2010 by [place] Follow-up by: Tye Savoy RN,  Apr 12, 2010 12:14 PM

## 2011-01-02 NOTE — Progress Notes (Signed)
Summary: SOB / Leg Cramps  Phone Note Call from Patient   Caller: [name] Summary of Call: [name] states that pt is SOB and having leg cramps/pls return call/tg Initial call taken by: Alphonsus Sias [facility],  Apr 19, 2010 10:05 AM  Follow-up for Phone Call        Tell patient she can stop the lasix today.  Return to $RemoveB'40mg'ezrdiWlJ$  two times a day.  Have her call [facility] for appointment.  Sen recent labs to Dr. Justin Mend.  Curt Bears  Additional Follow-up for Phone Call Additional follow up Details #1::        S: fluid in her ear, lightheaded B: pt doesn't want to take lasix, thinks its too much lasix, pt having leg cramps last night  A: labs done 04/18/2010, k+ 5.2, unsure of etiology of problems , we are awaiting bnp results from [facility] R:  Additional Follow-up by: Tye Savoy RN,  Apr 19, 2010 5:01 PM     Appended Document: SOB / Leg Cramps Send labs to Dr. Jason Nest office.  Hold Lasix dose and change to $RemoveB'40mg'PgwwnXIt$  two times a day starting thursday evening.  Have patient make appointment to see Dr. Justin Mend this week.  Call them to set it up if we have to.  Appended Document: SOB / Leg Cramps faxed labs to Dr. Jason Nest office, called pharmacy changed directions of lasix, sent all paperwork to Dr. Jason Nest office for an appt sooner than her May 03, 2010 appt.  He is in the hospital all this week and we will attempt to get her an appt for next week

## 2011-01-02 NOTE — Procedures (Signed)
Summary: Holter and Event  Holter and Event   Imported By: Nevada Crane 12/23/2009 12:26:38  _____________________________________________________________________  External Attachment:    Type:   Image     Comment:   External Document

## 2011-01-02 NOTE — Miscellaneous (Signed)
  Clinical Lists Changes  Medications: Changed medication from FUROSEMIDE 40 MG TABS (FUROSEMIDE) take 1 and 1/2 tablet by mouth once daily to FUROSEMIDE 40 MG TABS (FUROSEMIDE) Take 1 tablet by mouth two times a day

## 2011-01-02 NOTE — Letter (Signed)
Summary: LABS  LABS   Imported By: Nevada Crane 05/25/2010 09:55:15  _____________________________________________________________________  External Attachment:    Type:   Image     Comment:   External Document

## 2011-01-02 NOTE — Letter (Signed)
Summary: Renal Office Note  External Correspondence   Imported By: Doretha Sou, CNA 07/11/2010 11:25:07  _____________________________________________________________________  External Attachment:    Type:   Image     Comment:   External Document

## 2011-01-02 NOTE — Assessment & Plan Note (Signed)
Summary: ROV   Visit Type:  Follow-up Primary Provider:  Dr. Orson Ape  CC:  ISSUES WITH HEART RATE PVS PER ER.  History of Present Illness: [name] is a 77y/o CF with multiple medical problems to include hypertension, Cor Pulmonale, CRI with baseline Cr 1.5-1.7; diabetes, anemia, and recent hospitalization for fx left humerus and pubic symphysis fx.  She was diagnosed with RI during hospitalzation and followed by Dr. Justin Mend.  She was placed on Lasix during hospitalization by renal and after stablization for intial admission complaint was transfered to OP rehab for assistance for strengthening. At that time Prinizide was d/c'd and she started on lopressor. She followed up with Dr. Justin Mend as OP and was noticed not to be on Lasix and found to be fluid overloaded.  She was placed back on Lasix $Remove'80mg'nfPPrbp$  daily.  Follow-up labs were completed that indicated anemia with a Hgb of 8.0 she was sent to the ER for further evaluation where she was evaluated by ER MD.  Labs indicated that her Hgb was10.1 and Creat 2.2.  Her stools were heme +. The ER doctor released her after speaking with Dr. Posey Pronto, Dr. Jason Nest partner and she was told to follow-up with cardiology and primary care.  She is here today complaining of fatigue, SOB although not severe, LEE. Her daughter states she has been dizzy and lightheaded.  She feels her HR is slow. Orthostatics were completed in the office and found to be positive dropping from 240 systolic to 973 systolic when standing.  Mild dizziness was noted.  Review of EKG shows bradycardia with multiple PVC's and AV dissociation with a rate of 64bpm.  Current Medications (verified): 1)  Diazepam 10 Mg Tabs (Diazepam) .... Take As Needed 2)  Paxil 40 Mg Tabs (Paroxetine Hcl) .... Take 1 Tab Daily 3)  Lorcet 10/650 10-650 Mg Tabs (Hydrocodone-Acetaminophen) .... Take As Needed For Pain 4)  Glipizide 10 Mg Tabs (Glipizide) .... Take 1 Tab Daily 5)  Lomotil 2.5-0.025 Mg Tabs  (Diphenoxylate-Atropine) .... Take As Needed 6)  Lantus 100 Unit/ml Soln (Insulin Glargine) .... 25 Units At Bedtime 7)  Simvastatin 40 Mg Tabs (Simvastatin) .... Take One Tablet By Mouth Daily At Bedtime 8)  Prinzide 20-12.5 Mg Tabs (Lisinopril-Hydrochlorothiazide) .... Take 1 Tablet By Mouth Once A Day 9)  Metoprolol Tartrate 50 Mg Tabs (Metoprolol Tartrate) .... Take One Tablet By Mouth Twice A Day 10)  Fenofibrate 160 Mg Tabs (Fenofibrate) .... Take One Tablet By Mouth Daily With A Meal 11)  Protonix 40 Mg Tbec (Pantoprazole Sodium) .... Take 1 Tab Two Times A Day 12)  Novolin 70/30 70-30 % Susp (Insulin Isophane & Regular) .... Sliding Scale 13)  Aspir-Low 81 Mg Tbec (Aspirin) .... Take 1 Tab Daily 14)  Dulcolax Stool Softener 100 Mg Caps (Docusate Sodium) .... Take As Needed 15)  Ferrous Sulfate 325 (65 Fe) Mg Tabs (Ferrous Sulfate) .... Take 1 Tab Daily 16)  Daily Vites  Tabs (Multiple Vitamin) .... Take 1 Tab Daily 17)  Lyrica 50 Mg Caps (Pregabalin) .... Take 1 Tab Daily 18)  Ultram 50 Mg Tabs (Tramadol Hcl) .... Take As Directed 19)  Colace 100 Mg Caps (Docusate Sodium) .... Take As Needed 20)  Vitamin B-12 100 Mcg Tabs (Cyanocobalamin) .... Take 1 Tab Daily 21)  Robitussin Maximum Strength 15 Mg/72ml Syrp (Dextromethorphan Hbr) .... Take As Needed 22)  Mag-Ox 400 400 Mg Tabs (Magnesium Oxide) .... Take 1 Tab Daily 23)  Zofran 4 Mg Tabs (Ondansetron Hcl) .... Take  As Needed 24)  Allegra 180 Mg Tabs (Fexofenadine Hcl) .... Take 1 Tab Daily 25)  Furosemide 40 Mg Tabs (Furosemide) .... Take 1 and 1/2 Tablet By Mouth Once Daily 26)  Potassium Chloride Crys Cr 20 Meq Cr-Tabs (Potassium Chloride Crys Cr) .... Take 2 Tablets By Mouth By Mouth Once Daily  Allergies (verified): 1)  ! * Imodium  Past History:  Past medical, surgical, family and social histories (including risk factors) reviewed, and no changes noted (except as noted below).  Past Medical History: Reviewed history from  11/15/2009 and no changes required. 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  She has       undergone four cycles of FEC; right mastectomy in 2008  Fibromyalgia Cerebrovascular disease with a 60% stenosis of the left carotid       artery; repeat study in 10/2009-no obstructive disease; modest atherosclerosis Gastroesophageal reflux disease Diverticular disease  Past Surgical History: Reviewed history from 11/15/2009 and no changes required. Right mastectomy in 2008 port-a-cath placement Hysterectomy Cholecystectomy  Lumbosacral spine procedures x2 Bilateral cataract extractions with lens implants.  Appendectomy.   Family History: Reviewed history from 10/12/2009 and no changes required.  Mother deceased, age 46, Alzheimer disease.  Father   deceased, age 28, MI.  No family history of colorectal cancer. Siblings: 2 brothers have died due to coronary disease.  One sister is deceased as a result of an aortic aneurysm.  Social History: Reviewed history from 10/12/2009 and no changes required.  She is widowed on disability.  She lives alone.  Quit   smoking in the remote past.  No alcohol use.  2 children.  Review of Systems       The patient complains of dyspnea on exertion and peripheral edema.         Dizziness, Fatigue  All other systems have been reviewed and are negative unless stated above.   Vital Signs:  Patient profile:   77 year old female Weight:      234 pounds Pulse rate:   64 / minute Pulse (ortho):   58 / minute BP sitting:   118 / 68  Vitals Entered By: Doretha Sou, CNA (Apr 13, 2010 1:21 PM)  Serial Vital Signs/Assessments:  Time      Position  BP       Pulse  Resp  Temp     By 2:55 PM   Lying LA  144/60   62                    Lynn Via LPN 3:66 PM   Sitting   140/57   2                    Lynn Via LPN 4:40 PM   Standing  120/50   54                     Lynn Via LPN  Comments: 3:47 PM C/o being light headed when sitting up after lying position. By: Jeani Hawking Via LPN    Physical Exam  General:  normal appearance.   Head:  normocephalic and atraumatic Eyes:  PERRLA/EOM intact; conjunctiva and lids normal. Neck:  Neck supple, no JVD. No masses, thyromegaly or abnormal cervical nodes. Lungs:  decreased BS on L and decreased BS on R.  Otherwise clear Heart:  Distant, irregular Abdomen:  Bowel sounds positive; abdomen soft and  non-tender without masses, organomegaly, or hernias noted. No hepatosplenomegaly. Msk:  Generalized weakness Pulses:  pulses normal in all 4 extremities Extremities:  1+ left pedal edema and 1+ right pedal edema to the pretibial area, Neurologic:  Alert and oriented x 3. Skin:  Pale Psych:  depressed affect.     EKG  Procedure date:  04/13/2010  Findings:      Sinus bradycardia with rate of:64  PVC's noted.  Indeterminate block.  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  The patient has been seen and examined by Dr. Lattie Haw as well.  We both reviewed her records and events leading up to this visit.  She will be placed back on Lasix $Remove'60mg'OwCZhyb$  daily. She will continue her Mg dose per Dr. Justin Mend and K+  Last Echo for comparison.  In 2009 her EF was normal, but she had RVH.  Will revaluate for this.  She will continue the lopressor as directed.  We will repeat her labs in 7-10 days with new med regimine.  Agree with discontinuing the ACE.  We need to find balance of fluid maintanence and Kidney fx.  Dr. Lattie Haw will discuss this futher with Dr. Justin Mend. Her updated medication list for this problem includes:    Prinzide 20-12.5 Mg Tabs (Lisinopril-hydrochlorothiazide) .Marland Kitchen... Take 1 tablet by mouth once a day    Metoprolol Tartrate 50 Mg Tabs (Metoprolol tartrate) .Marland Kitchen... Take one tablet by mouth  twice a day    Aspir-low 81 Mg Tbec (Aspirin) .Marland Kitchen... Take 1 tab daily    Furosemide 40 Mg Tabs (Furosemide) .Marland Kitchen... Take 1 and 1/2 tablet by mouth once daily  Problem # 2:  CHRONIC KIDNEY DISEASE STAGE II (MILD) (ICD-585.2) Creat level was elevated on ER report at 2.2 with baseline of 1.5-1.7 during March discharge.  Will continue to work with Dr. Justin Mend with labs and med adjustments.  Problem # 3:  ANEMIA IN CHRONIC KIDNEY DISEASE (ICD-285.21) The Hgb was improved on labs in ER from reported 8.0 to 10.0.  She is on iron.  Will defer to primary for further work-up.  Other Orders: T-Basic Metabolic Panel ([id number]) T-BNP  (B Natriuretic Peptide) [phone]) T-Magnesium [phone]) 2-D Echocardiogram (2D Echo)  Patient Instructions: 1)  Your physician recommends that you schedule a follow-up appointment in: 10 days to 2 weeks 2)  Your physician recommends that you return for lab work in: TODAY 3)  Your physician has recommended you make the following change in your medication: Start taking Lasix $RemoveBefo'60mg'iSvfVbuSiqn$  by mouth once daily and Potassium 63meq. by mouth once daily  4)  Your physician has requested that you have an echocardiogram.  Echocardiography is a painless test that uses sound waves to create images of your heart. It provides your doctor with information about the size and shape of your heart and how well your heart's chambers and valves are working.  This procedure takes approximately one hour. There are no restrictions for this procedure. Prescriptions: POTASSIUM CHLORIDE CRYS CR 20 MEQ CR-TABS (POTASSIUM CHLORIDE CRYS CR) Take 2 tablets by mouth by mouth once daily  #60 x 6   Entered by:   Jeani Hawking Via LPN   Authorized by:   Jory Sims, NP   Signed by:   Jeani Hawking Via LPN on 60/09/9322   Method used:   Electronically to        [facility] (retail)  [address]       [place], [place]  [address]       Ph: [phone]       Fax: [phone]    RxID:   [id number] FUROSEMIDE 40 MG TABS (FUROSEMIDE) take 1 and 1/2 tablet by mouth once daily  #45 x 6   Entered by:   Jeani Hawking Via LPN   Authorized by:   Jory Sims, NP   Signed by:   Jeani Hawking Via LPN on 75/44/9201   Method used:   Electronically to        [facility] (retail)       [address]       [place], [place]  [address]       Ph: [phone]       Fax: [phone]   RxID:   [id number]

## 2011-01-02 NOTE — Assessment & Plan Note (Signed)
Summary: ROV 10 DAYS PER PT CHECK OUT/TMJ   Visit Type:  Follow-up Primary Provider:  Dr. Orson Ape   History of Present Illness: [name] is a 77y/o CF with multiple medical problems to include hypertension, Cor Pulmonale, CRI with baseline Cr 1.5-1.7; diabetes, anemia, and recent hospitalization for fx left humerus and pubic symphysis fx.  She was diagnosed with RI during hospitalzation and followed by Dr. Justin Mend.  She was placed on Lasix during hospitalization by renal and after stablization for intial admission complaint was transfered to OP rehab for assistance for strengthening. At that time Prinizide was d/c'd and she started on lopressor. She followed up with Dr. Justin Mend as OP and was noticed not to be on Lasix and found to be fluid overloaded.  She was placed back on Lasix $Remove'80mg'TwrsTgw$  daily.  Follow-up labs were completed that indicated anemia with a Hgb of 8.0 she was sent to the ER for further evaluation where she was evaluated by ER MD.  Labs indicated that her Hgb was10.1 and Creat 2.2.  Her stools were heme +. The ER doctor released her after speaking with Dr. Posey Pronto, Dr. Jason Nest partner and she was told to follow-up with cardiology and primary care.   We saw her on 5.12.2011 and she continued to have complaints of fatigue and DOE.  She also was found to have LEE.  She was also evaluated by Dr. Lattie Haw who advised that she return to $RemoveB'60mg'zyYuisLk$  of Lasix daily.  An echo was ordered for LV fx.  Follow-up labs were obtained.  On those labs she was found to have elevated Creat of 3.18 which was higher than intial level 2.2 during recent hospitalization. BNP was also elevated at 1099.8.  She continued fluid retention and Lasix was increased to $RemoveBefo'40mg'QjEdAKeliBz$  two times a day.  She returns today with less fluid retention but continued fatigue.  She has some DOE but not as severe as in the past.  No complaints of pain.  Current Medications (verified): 1)  Diazepam 10 Mg Tabs (Diazepam) .... Take As Needed 2)  Paxil 40 Mg  Tabs (Paroxetine Hcl) .... Take 1 Tab Daily 3)  Lorcet 10/650 10-650 Mg Tabs (Hydrocodone-Acetaminophen) .... Take As Needed For Pain 4)  Glipizide 10 Mg Tabs (Glipizide) .... Take 1 Tab Daily 5)  Lomotil 2.5-0.025 Mg Tabs (Diphenoxylate-Atropine) .... Take As Needed 6)  Lantus 100 Unit/ml Soln (Insulin Glargine) .... 25 Units At Bedtime 7)  Simvastatin 40 Mg Tabs (Simvastatin) .... Take One Tablet By Mouth Daily At Bedtime 8)  Prinzide 20-12.5 Mg Tabs (Lisinopril-Hydrochlorothiazide) .... Take 1 Tablet By Mouth Once A Day 9)  Metoprolol Tartrate 50 Mg Tabs (Metoprolol Tartrate) .... Take One Tablet By Mouth Twice A Day 10)  Fenofibrate 160 Mg Tabs (Fenofibrate) .... Take One Tablet By Mouth Daily With A Meal 11)  Protonix 40 Mg Tbec (Pantoprazole Sodium) .... Take 1 Tab Two Times A Day 12)  Novolin 70/30 70-30 % Susp (Insulin Isophane & Regular) .... Sliding Scale 13)  Aspir-Low 81 Mg Tbec (Aspirin) .... Take 1 Tab Daily 14)  Dulcolax Stool Softener 100 Mg Caps (Docusate Sodium) .... Take As Needed 15)  Ferrous Sulfate 325 (65 Fe) Mg Tabs (Ferrous Sulfate) .... Take 1 Tab Daily 16)  Daily Vites  Tabs (Multiple Vitamin) .... Take 1 Tab Daily 17)  Lyrica 50 Mg Caps (Pregabalin) .... Take 1 Tab Daily 18)  Ultram 50 Mg Tabs (Tramadol Hcl) .... Take As Directed 19)  Colace 100 Mg Caps (Docusate Sodium) .Marland KitchenMarland KitchenMarland Kitchen  Take As Needed 20)  Vitamin B-12 100 Mcg Tabs (Cyanocobalamin) .... Take 1 Tab Daily 21)  Robitussin Maximum Strength 15 Mg/76ml Syrp (Dextromethorphan Hbr) .... Take As Needed 22)  Mag-Ox 400 400 Mg Tabs (Magnesium Oxide) .... Take 1 Tab Daily 23)  Zofran 4 Mg Tabs (Ondansetron Hcl) .... Take As Needed 24)  Allegra 180 Mg Tabs (Fexofenadine Hcl) .... Take 1 Tab Daily 25)  Furosemide 40 Mg Tabs (Furosemide) .... Take 1 Tablet By Mouth Two Times A Day 26)  Potassium Chloride Crys Cr 20 Meq Cr-Tabs (Potassium Chloride Crys Cr) .... Take 2 Tablets By Mouth By Mouth Once Daily  Allergies  (verified): 1)  ! * Imodium  Past History:  Past medical, surgical, family and social histories (including risk factors) reviewed, and no changes noted (except as noted below).  Past Medical History: Reviewed history from 11/15/2009 and no changes required. 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  She has       undergone four cycles of FEC; right mastectomy in 2008  Fibromyalgia Cerebrovascular disease with a 60% stenosis of the left carotid       artery; repeat study in 10/2009-no obstructive disease; modest atherosclerosis Gastroesophageal reflux disease Diverticular disease  Past Surgical History: Reviewed history from 11/15/2009 and no changes required. Right mastectomy in 2008 port-a-cath placement Hysterectomy Cholecystectomy  Lumbosacral spine procedures x2 Bilateral cataract extractions with lens implants.  Appendectomy.   Family History: Reviewed history from 10/12/2009 and no changes required.  Mother deceased, age 26, Alzheimer disease.  Father   deceased, age 54, MI.  No family history of colorectal cancer. Siblings: 2 brothers have died due to coronary disease.  One sister is deceased as a result of an aortic aneurysm.  Social History: Reviewed history from 10/12/2009 and no changes required.  She is widowed on disability.  She lives alone.  Quit   smoking in the remote past.  No alcohol use.  2 children.  Review of Systems       DOE, Weakness.  All other systems have been reviewed and are negative unless stated above.    Vital Signs:  Patient profile:   77 year old female Weight:      233 pounds BMI:     32.61 Pulse rate:   72 / minute BP sitting:   90 / 62  (right arm)  Vitals Entered By: Dreama Saa, CNA (Apr 25, 2010 2:32 PM)  Physical Exam  General:  normal appearance.   Lungs:  Clear bilaterally to auscultation and  percussion. Heart:  1/6 systolic murmur.  Distant HS. Abdomen:  Obese, nontender 2+ Bowel sounds. Pulses:  pulses normal in all 4 extremities Extremities:  No clubbing or cyanosis. Skin:  Pale Psych:  depressed affect.     Impression & Recommendations:  Problem # 1:  CHRONIC KIDNEY DISEASE STAGE II (MILD) (ICD-585.2) Review of labs warrants prompt follow-up with Dr. Hyman Hopes. She is due to see him in one week.  She appears more pale on this visit.  We will order labs to assess for anemia status and renal fx.  She will have these available to Dr. Hyman Hopes on that visit. At this time I will not change her diuretic doses.  The fluid overload has been alleviated since last visit on this exam. 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  EF was 45%-50%. Moderate hypokinesis of the mid-distal anteroseptal myocardium. Mildly calcified aortic valve annulus. No evidence of fluid overload on this assessment. Her updated medication list for this problem includes:    Prinzide 20-12.5 Mg Tabs (Lisinopril-hydrochlorothiazide) .Marland Kitchen... Take 1 tablet by mouth once a day    Metoprolol Tartrate 50 Mg Tabs (Metoprolol tartrate) .Marland Kitchen... Take one tablet by mouth twice a day    Aspir-low 81 Mg Tbec (Aspirin) .Marland Kitchen... Take 1 tab daily    Furosemide 40 Mg Tabs (Furosemide) .Marland Kitchen... Take 1 tablet by mouth two times a day  Orders: T-Basic Metabolic Panel ([id number]) T-CBC w/Diff [phone])  Problem # 3:  DYSPNEA (ICD-786.05) CBC is ordered to assess for anemia in this setting.  She may have a component of deconditioning as well contributing to this state. Her updated medication list for this problem includes:    Prinzide 20-12.5 Mg Tabs (Lisinopril-hydrochlorothiazide) .Marland Kitchen... Take 1 tablet by mouth once a day    Metoprolol Tartrate  50 Mg Tabs (Metoprolol tartrate) .Marland Kitchen... Take one tablet by mouth twice a day    Aspir-low 81 Mg Tbec (Aspirin) .Marland Kitchen... Take 1 tab daily    Furosemide 40 Mg Tabs (Furosemide) .Marland Kitchen... 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 Take 1 tablet by mouth once a day    Metoprolol Tartrate 50 Mg Tabs (Metoprolol tartrate) .Marland Kitchen... Take one tablet by mouth twice a day    Aspir-low 81 Mg Tbec (Aspirin) .Marland Kitchen... Take 1 tab daily    Furosemide 40 Mg Tabs (Furosemide) .Marland Kitchen... Take 1 tablet by mouth two times a day  Orders: T-Basic Metabolic Panel ([id number]) T-CBC w/Diff [phone])  Patient Instructions: 1)  Your physician recommends that you schedule a follow-up appointment in: 1 month 2)  Your physician recommends that you return for lab work in: TODAY 3)  Your physician recommends that you continue on your current medications as directed. Please refer to the Current Medication list given to you today.

## 2011-01-02 NOTE — Miscellaneous (Signed)
Summary: Orders Update  Clinical Lists Changes  Orders: Added new Test order of Hemoccult Cards (Take Home) (Hemoccult Cards) - Signed

## 2011-01-02 NOTE — Progress Notes (Signed)
Summary: FYI  Phone Note Call from Patient Call back at [phone]   Caller: Other Relative (pt's daughter-in-law/Mindy) Reason for Call: Refill Medication Summary of Call: pt's daughter-in-law called and stated that she received letter stating that she had missed appt. and had labwork due/pt is in [facility] due to fall and broken pelvis and humurus/pt's daughter-in-law take lab order there to be done/will call to reschedule appt. when patient is able/tg Initial call taken by: Alphonsus Sias [facility],  February 17, 2010 1:41 PM  Follow-up for Phone Call        Pt. to have [facility] fax results to this office.

## 2011-01-02 NOTE — Letter (Signed)
Summary: [facility], general practice at [place]. [address], [place] [address]   Phone: [phone]  Fax: [phone]     October 19, 2010 MRN: [id number]   [facility] [address] [place],   [address]      YOUR LAB WORK IS DUE   November 20, 2010  Please go to [facility], located across the street from [facility] on the second floor.  Hours are Monday - Friday 7am until 7:30pm         Saturday 8am until 12noon    _X_  DO NOT EAT OR DRINK AFTER MIDNIGHT EVENING PRIOR TO LABWORK

## 2011-01-02 NOTE — Miscellaneous (Signed)
Summary: LABS CBCD,BMP,04/25/2010  Clinical Lists Changes  Observations: Added new observation of ABSOLUTE BAS: 0.0 K/uL (04/25/2010 16:24) Added new observation of BASOPHIL %: 0 % (04/25/2010 16:24) Added new observation of EOS ABSLT: 0.2 K/uL (04/25/2010 16:24) Added new observation of % EOS AUTO: 2 % (04/25/2010 16:24) Added new observation of ABSOLUTE MON: 0.5 K/uL (04/25/2010 16:24) Added new observation of MONOCYTE %: 6 % (04/25/2010 16:24) Added new observation of ABS LYMPHOCY: 2.6 K/uL (04/25/2010 16:24) Added new observation of LYMPHS %: 33 % (04/25/2010 16:24) Added new observation of RDW: 14.2 % (04/25/2010 16:24) Added new observation of MCHC RBC: 30.4 g/dL (04/25/2010 16:24) Added new observation of RBC M/UL: 3.76 M/uL (04/25/2010 16:24) Added new observation of CALCIUM: 9.4 mg/dL (04/25/2010 16:24) Added new observation of CREATININE: 2.98 mg/dL (04/25/2010 16:24) Added new observation of BUN: 98 mg/dL (04/25/2010 16:24) Added new observation of BG RANDOM: 119 mg/dL (04/25/2010 16:24) Added new observation of CO2 PLSM/SER: 25 meq/L (04/25/2010 16:24) Added new observation of CL SERUM: 103 meq/L (04/25/2010 16:24) Added new observation of K SERUM: 5.0 meq/L (04/25/2010 16:24) Added new observation of NA: 140 meq/L (04/25/2010 16:24) Added new observation of PLATELETK/UL: 225 K/uL (04/25/2010 16:24) Added new observation of MCV: 87.5 fL (04/25/2010 16:24) Added new observation of HCT: 32.9 % (04/25/2010 16:24) Added new observation of HGB: 10.0 g/dL (04/25/2010 16:24) Added new observation of WBC COUNT: 7.7 10*3/microliter (04/25/2010 16:24)

## 2011-01-02 NOTE — Assessment & Plan Note (Signed)
Summary: 1 mth f/u per checkout on 04/25/10/tg   Visit Type:  Follow-up Primary Provider:  Dr.Fanta   History of Present Illness: [name] returns to the office as scheduled for continued assessment and treatment of multiple issues including a remote history of atrial flutter for which anticoagulation has been maintained, hypertension, hypertensive heart disease, elevated pulmonary artery pressures characterized as pulmonary hypertension and fluid retention with chronic kidney disease.  She has been followed closely by [facility] to balance adverse effects of diuretics with adequate removal of fluid.  Creatinine has settled in the 2.5-3 range.  She has done well in recent months with no dyspnea, no chest pain, no significant pedal edema, no orthopnea, no PND, no lightheadedness and no syncope.  She notes no dizziness on standing although she is said to have a history of orthostatic hypotension.   Current Medications (verified): 1)  Diazepam 10 Mg Tabs (Diazepam) .... Take As Needed 2)  Paxil 40 Mg Tabs (Paroxetine Hcl) .... Take 1 Tab Daily 3)  Lorcet 10/650 10-650 Mg Tabs (Hydrocodone-Acetaminophen) .... Take As Needed For Pain 4)  Glipizide 10 Mg Tabs (Glipizide) .... Take 1 Tab Daily 5)  Lomotil 2.5-0.025 Mg Tabs (Diphenoxylate-Atropine) .... Take As Needed 6)  Lantus 100 Unit/ml Soln (Insulin Glargine) .... 25 Units At Bedtime 7)  Simvastatin 40 Mg Tabs (Simvastatin) .... Take One Tablet By Mouth Daily At Bedtime 8)  Prinzide 20-12.5 Mg Tabs (Lisinopril-Hydrochlorothiazide) .... Take 1 Tablet By Mouth Once A Day 9)  Metoprolol Tartrate 50 Mg Tabs (Metoprolol Tartrate) .... Take One Tablet By Mouth Twice A Day 10)  Fenofibrate 160 Mg Tabs (Fenofibrate) .... Take One Tablet By Mouth Daily With A Meal 11)  Protonix 40 Mg Tbec (Pantoprazole Sodium) .... Take 1 Tab Two Times A Day 12)  Novolin 70/30 70-30 % Susp (Insulin Isophane & Regular) .... Sliding Scale 13)  Aspir-Low 81 Mg  Tbec (Aspirin) .... Take 1 Tab Daily 14)  Ferrous Sulfate 325 (65 Fe) Mg Tabs (Ferrous Sulfate) .... Take 1 Tab Daily 15)  Daily Vites  Tabs (Multiple Vitamin) .... Take 1 Tab Daily 16)  Lyrica 50 Mg Caps (Pregabalin) .... Take 1 Tab Daily 17)  Ultram 50 Mg Tabs (Tramadol Hcl) .... Take As Directed 18)  Colace 100 Mg Caps (Docusate Sodium) .... Take As Needed 19)  Vitamin B-12 100 Mcg Tabs (Cyanocobalamin) .... Take 1 Tab Daily 20)  Mag-Ox 400 400 Mg Tabs (Magnesium Oxide) .... Take 1 Tab Daily 21)  Furosemide 40 Mg Tabs (Furosemide) .... Take 1 Tablet By Mouth Daily 22)  Potassium Chloride Crys Cr 20 Meq Cr-Tabs (Potassium Chloride Crys Cr) .... Take 2 Tablets By Mouth By Mouth Once Daily 23)  Tears Naturale Ii  Soln (Artificial Tear Solution) .... As Needed 24)  Triglide 160 Mg Tabs (Fenofibrate) .... Take 1 Tab Daily  Allergies (verified): 1)  ! * Imodium  Past History:  Past Surgical History: Last updated: 11/15/2009 Right mastectomy in 2008 port-a-cath placement Hysterectomy Cholecystectomy  Lumbosacral spine procedures x2 Bilateral cataract extractions with lens implants.  Appendectomy.   Family History: Last updated: 10-26-09  Mother deceased, age 34, Alzheimer disease.  Father   deceased, age 79, MI.  No family history of colorectal cancer. Siblings: 2 brothers have died due to coronary disease.  One sister is deceased as a result of an aortic aneurysm.  Social History: Last updated: 10-26-09  She is widowed on disability.  She lives alone.  Quit   smoking in  the remote past.  No alcohol use.  2 children.  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  Vital Signs:  Patient profile:   78 year old female Weight:      234 pounds Pulse rate:   84 / minute Pulse (ortho):   84 / minute BP sitting:   138 / 50  (right arm) BP standing:   154 / 83  Vitals Entered By: Lauren Sou, CNA (July 14, 2010 2:56 PM)  Serial Vital Signs/Assessments:  Time      Position  BP       Pulse  Resp  Temp     By 4:04 PM   Lying LA  162/73   67                    Lauren Sanders Beard 4:04 PM   Sitting   178/84   52                    Lauren Sanders Beard 4:04 PM   Standing  154/83   84                    Lauren Sanders Beard  Comments: 4:04 PM no s/s By: [name]    Physical Exam  General:  Overweight; well developed; no acute distress:   Neck-No JVD; no carotid bruits: Lungs-No tachypnea, no rales; no rhonchi; no wheezes: Cardiovascular-normal PMI; normal S1 and S2; modest basilar systolic ejection murmur Abdomen-BS normal; soft and non-tender without masses or organomegaly:  Musculoskeletal-No deformities, no cyanosis or clubbing: Neurologic-Normal cranial nerves; symmetric strength and tone:  Skin-Warm, Pallor; no significant lesions: Extremities-1-2+ distal pulses; trace edema:     Impression & Recommendations:  Problem # 1:  ANTICOAGULATION (ICD-V58.61) No documentation of atrial arrhythmias has ever been located despite multiple requests for medical records.  Atrial flutter was noted in a prior cardiologist's office note in 2003.  There has been no documentation of recurrent arrhythmia-her initial episode converted spontaneously.  We have no information about what issues were paramount at the time her arrhythmia was diagnosed.  Continuation of anticoagulation likely does not have a favorable risk-benefit ratio.  I anticipate discontinuing warfarin at  patient's next visit.  Problem # 2:  AORTIC STENOSIS-VERY MILD (ICD-424.1) Exam does not suggest any progression of disease; however, to evaluate right-sided pressures and left ventricular dysfunction,a repeat echocardiogram will be obtained.  Problem # 3:  CHRONIC KIDNEY DISEASE STAGE 4 (ICD-585.2) Renal function and electrolytes will be reassessed.  Current dosage of diuretics appears appropriate.  Her medication list includes magnesium oxide although patient does not believe she is taking those pills.  With fairly advanced renal insufficiency, she should avoid magnesium intake unless absolutely necessary.  A magnesium serum level will be checked.  Problem # 4:  COMBINED HEART FAILURE, CHRONIC (ICD-428.42) While patient may have hypertensive heart disease, I do not believe she has significant valvular disease or true pulmonary hypertension unless she has hypoventilation obesity syndrome.  Echocardiography and BNP level will be  obtained to further evaluate these possibilities.  Problem # 5:  ORTHOSTATIC HYPOTENSION (ICD-458.0) Measurements today show no significant orthostatic change in blood pressure.  Problem # 6:  HYPERLIPIDEMIA (IZX-281.4) Control of hyperlipidemia will be reassessed with a lipid profile.  Problem # 7:  HYPERTENSION (ICD-401.1) Blood pressure control is good.  Current medications will be continued.  Problem # 8:  ANEMIA IN CHRONIC KIDNEY DISEASE (ICD-285.21) She seems to be quite functional without significant weakness, exercise intolerance or dyspnea.  Accordingly, treatment with an ESA does not appear to be necessary.  Hemoccult testing of stool will be performed.  Other Orders: T-Comprehensive Metabolic Panel ([id number]) T-Magnesium [id number]) T-BNP  (B Natriuretic Peptide) [id number]) T-Hgb A1C ([id number]) Future Orders: T-Basic Metabolic Panel ([id number]) ... 09/11/2010  Patient Instructions: 1)  Your physician recommends that you schedule a  follow-up appointment in: 3 months 2)  Your physician recommends that you return for lab work JL:LVDIX 3)  Your physician recommends that you continue on your current medications as directed. Please refer to the Current Medication list given to you today. 4)  Your physician has asked that you test your stool for blood. It is necessary to test 3 different stool specimens for accuracy. You will be given 3 hemoccult cards for specimen collection. For each stool specimen, place a small portion of stool sample (from 2 different areas of the stool) into the 2 squares on the card. Close card. Repeat with 2 more stool specimens. Bring the cards back to the office for testing.

## 2011-01-02 NOTE — Procedures (Signed)
Summary: Holter and Event  Holter and Event   Imported By: [place] Crane 12/28/2009 16:50:58  _____________________________________________________________________  External Attachment:    Type:   Image     Comment:   External Document

## 2011-01-02 NOTE — Letter (Signed)
Summary: PROGRESS NOTE 05-03-10 DR Justin Mend  PROGRESS NOTE 05-03-10 DR Justin Mend   Imported By: Nevada Crane 05/25/2010 10:14:18  _____________________________________________________________________  External Attachment:    Type:   Image     Comment:   External Document

## 2011-01-04 ENCOUNTER — Other Ambulatory Visit (HOSPITAL_COMMUNITY): Payer: MEDICARE

## 2011-01-04 ENCOUNTER — Ambulatory Visit (HOSPITAL_COMMUNITY): Admission: RE | Admit: 2011-01-04 | Payer: Self-pay | Source: Ambulatory Visit | Admitting: Oncology

## 2011-01-04 ENCOUNTER — Encounter (HOSPITAL_COMMUNITY): Admission: RE | Admit: 2011-01-04 | Payer: Self-pay | Source: Home / Self Care | Admitting: Oncology

## 2011-01-04 DIAGNOSIS — Z452 Encounter for adjustment and management of vascular access device: Secondary | ICD-10-CM

## 2011-01-04 DIAGNOSIS — C50919 Malignant neoplasm of unspecified site of unspecified female breast: Secondary | ICD-10-CM

## 2011-01-04 NOTE — Letter (Signed)
Summary: [facility] NOTE  [facility]   Imported By: Nevada Crane 12/26/2010 14:59:53  _____________________________________________________________________  External Attachment:    Type:   Image     Comment:   External Document

## 2011-01-10 NOTE — Letter (Signed)
Summary: DR Marlou Starks OFFICE NOTE  DR Clinton OFFICE NOTE   Imported By: Nevada Crane 01/01/2011 15:54:31  _____________________________________________________________________  External Attachment:    Type:   Image     Comment:   External Document

## 2011-01-15 ENCOUNTER — Ambulatory Visit (HOSPITAL_COMMUNITY): Payer: Self-pay | Admitting: Oncology

## 2011-02-14 LAB — BASIC METABOLIC PANEL
CO2: 24 mEq/L (ref 19–32)
Glucose, Bld: 402 mg/dL — ABNORMAL HIGH (ref 70–99)
Potassium: 5.2 mEq/L — ABNORMAL HIGH (ref 3.5–5.1)
Sodium: 133 mEq/L — ABNORMAL LOW (ref 135–145)

## 2011-02-14 LAB — GLUCOSE, CAPILLARY: Glucose-Capillary: 396 mg/dL — ABNORMAL HIGH (ref 70–99)

## 2011-02-15 ENCOUNTER — Encounter (HOSPITAL_COMMUNITY): Payer: MEDICARE

## 2011-02-16 ENCOUNTER — Encounter (HOSPITAL_COMMUNITY): Payer: Medicare Other

## 2011-02-16 DIAGNOSIS — Z452 Encounter for adjustment and management of vascular access device: Secondary | ICD-10-CM

## 2011-02-16 DIAGNOSIS — C50919 Malignant neoplasm of unspecified site of unspecified female breast: Secondary | ICD-10-CM

## 2011-02-18 LAB — IRON AND TIBC
Iron: 87 ug/dL (ref 42–135)
Saturation Ratios: 23 % (ref 20–55)
TIBC: 386 ug/dL (ref 250–470)
UIBC: 299 ug/dL

## 2011-02-20 LAB — URINALYSIS, ROUTINE W REFLEX MICROSCOPIC
Hgb urine dipstick: NEGATIVE
Nitrite: NEGATIVE
Specific Gravity, Urine: 1.009 (ref 1.005–1.030)
Urobilinogen, UA: 0.2 mg/dL (ref 0.0–1.0)

## 2011-02-20 LAB — HEPATIC FUNCTION PANEL
ALT: 14 U/L (ref 0–35)
Albumin: 3.6 g/dL (ref 3.5–5.2)
Alkaline Phosphatase: 74 U/L (ref 39–117)
Total Bilirubin: 0.5 mg/dL (ref 0.3–1.2)

## 2011-02-20 LAB — URINE CULTURE

## 2011-02-20 LAB — CBC
HCT: 30.5 % — ABNORMAL LOW (ref 36.0–46.0)
MCV: 88.8 fL (ref 78.0–100.0)
RBC: 3.43 MIL/uL — ABNORMAL LOW (ref 3.87–5.11)
WBC: 6.7 10*3/uL (ref 4.0–10.5)

## 2011-02-20 LAB — DIFFERENTIAL
Eosinophils Absolute: 0.1 10*3/uL (ref 0.0–0.7)
Eosinophils Relative: 2 % (ref 0–5)
Lymphs Abs: 2.4 10*3/uL (ref 0.7–4.0)
Monocytes Relative: 7 % (ref 3–12)

## 2011-02-20 LAB — POCT I-STAT, CHEM 8
Calcium, Ion: 1.13 mmol/L (ref 1.12–1.32)
Creatinine, Ser: 2.2 mg/dL — ABNORMAL HIGH (ref 0.4–1.2)
Hemoglobin: 11.2 g/dL — ABNORMAL LOW (ref 12.0–15.0)
Sodium: 143 mEq/L (ref 135–145)
TCO2: 29 mmol/L (ref 0–100)

## 2011-02-21 LAB — COMPREHENSIVE METABOLIC PANEL
ALT: 14 U/L (ref 0–35)
AST: 16 U/L (ref 0–37)
Albumin: 3.6 g/dL (ref 3.5–5.2)
CO2: 27 mEq/L (ref 19–32)
Chloride: 105 mEq/L (ref 96–112)
GFR calc Af Amer: 41 mL/min — ABNORMAL LOW (ref >60)
GFR calc non Af Amer: 34 mL/min — ABNORMAL LOW (ref >60)
Potassium: 4.2 mEq/L (ref 3.5–5.1)
Sodium: 139 mEq/L (ref 135–145)
Total Bilirubin: 0.4 mg/dL (ref 0.3–1.2)

## 2011-02-21 LAB — DIFFERENTIAL
Basophils Absolute: 0 10*3/uL (ref 0.0–0.1)
Eosinophils Absolute: 0.5 10*3/uL (ref 0.0–0.7)
Eosinophils Relative: 9 % — ABNORMAL HIGH (ref 0–5)
Monocytes Absolute: 0.3 10*3/uL (ref 0.1–1.0)

## 2011-02-21 LAB — CBC
Platelets: 196 10*3/uL (ref 150–400)
RBC: 3.8 MIL/uL — ABNORMAL LOW (ref 3.87–5.11)
WBC: 5.8 10*3/uL (ref 4.0–10.5)

## 2011-02-25 LAB — GLUCOSE, CAPILLARY
Glucose-Capillary: 176 mg/dL — ABNORMAL HIGH (ref 70–99)
Glucose-Capillary: 176 mg/dL — ABNORMAL HIGH (ref 70–99)
Glucose-Capillary: 193 mg/dL — ABNORMAL HIGH (ref 70–99)
Glucose-Capillary: 194 mg/dL — ABNORMAL HIGH (ref 70–99)
Glucose-Capillary: 194 mg/dL — ABNORMAL HIGH (ref 70–99)
Glucose-Capillary: 205 mg/dL — ABNORMAL HIGH (ref 70–99)
Glucose-Capillary: 208 mg/dL — ABNORMAL HIGH (ref 70–99)
Glucose-Capillary: 220 mg/dL — ABNORMAL HIGH (ref 70–99)
Glucose-Capillary: 226 mg/dL — ABNORMAL HIGH (ref 70–99)
Glucose-Capillary: 235 mg/dL — ABNORMAL HIGH (ref 70–99)
Glucose-Capillary: 235 mg/dL — ABNORMAL HIGH (ref 70–99)
Glucose-Capillary: 244 mg/dL — ABNORMAL HIGH (ref 70–99)
Glucose-Capillary: 251 mg/dL — ABNORMAL HIGH (ref 70–99)
Glucose-Capillary: 256 mg/dL — ABNORMAL HIGH (ref 70–99)
Glucose-Capillary: 259 mg/dL — ABNORMAL HIGH (ref 70–99)
Glucose-Capillary: 266 mg/dL — ABNORMAL HIGH (ref 70–99)

## 2011-02-25 LAB — URINE CULTURE: Colony Count: >=100000

## 2011-02-25 LAB — BASIC METABOLIC PANEL
BUN: 36 mg/dL — ABNORMAL HIGH (ref 6–23)
BUN: 44 mg/dL — ABNORMAL HIGH (ref 6–23)
CO2: 24 mEq/L (ref 19–32)
Calcium: 8.1 mg/dL — ABNORMAL LOW (ref 8.4–10.5)
Calcium: 8.6 mg/dL (ref 8.4–10.5)
Calcium: 9 mg/dL (ref 8.4–10.5)
Chloride: 103 mEq/L (ref 96–112)
Creatinine, Ser: 2.38 mg/dL — ABNORMAL HIGH (ref 0.4–1.2)
GFR calc Af Amer: 35 mL/min — ABNORMAL LOW (ref >60)
GFR calc Af Amer: 36 mL/min — ABNORMAL LOW (ref >60)
GFR calc non Af Amer: 19 mL/min — ABNORMAL LOW (ref >60)
GFR calc non Af Amer: 20 mL/min — ABNORMAL LOW (ref >60)
GFR calc non Af Amer: 29 mL/min — ABNORMAL LOW (ref >60)
GFR calc non Af Amer: 30 mL/min — ABNORMAL LOW (ref >60)
Glucose, Bld: 180 mg/dL — ABNORMAL HIGH (ref 70–99)
Glucose, Bld: 249 mg/dL — ABNORMAL HIGH (ref 70–99)
Glucose, Bld: 314 mg/dL — ABNORMAL HIGH (ref 70–99)
Potassium: 3.6 mEq/L (ref 3.5–5.1)
Potassium: 4.1 mEq/L (ref 3.5–5.1)
Potassium: 4.1 mEq/L (ref 3.5–5.1)
Sodium: 134 mEq/L — ABNORMAL LOW (ref 135–145)
Sodium: 136 mEq/L (ref 135–145)
Sodium: 136 mEq/L (ref 135–145)
Sodium: 141 mEq/L (ref 135–145)

## 2011-02-25 LAB — URINALYSIS, ROUTINE W REFLEX MICROSCOPIC
Glucose, UA: NEGATIVE mg/dL
Ketones, ur: NEGATIVE mg/dL
Nitrite: NEGATIVE
Protein, ur: NEGATIVE mg/dL

## 2011-02-25 LAB — HEMOGLOBIN A1C: Mean Plasma Glucose: 235 mg/dL

## 2011-02-25 LAB — POCT I-STAT, CHEM 8
BUN: 32 mg/dL — ABNORMAL HIGH (ref 6–23)
Calcium, Ion: 1.09 mmol/L — ABNORMAL LOW (ref 1.12–1.32)
Creatinine, Ser: 1.8 mg/dL — ABNORMAL HIGH (ref 0.4–1.2)
Glucose, Bld: 170 mg/dL — ABNORMAL HIGH (ref 70–99)
Hemoglobin: 10.5 g/dL — ABNORMAL LOW (ref 12.0–15.0)
TCO2: 26 mmol/L (ref 0–100)

## 2011-02-25 LAB — URINALYSIS, MICROSCOPIC ONLY
Bilirubin Urine: NEGATIVE
Glucose, UA: NEGATIVE mg/dL
Ketones, ur: NEGATIVE mg/dL
Nitrite: POSITIVE — AB
pH: 5.5 (ref 5.0–8.0)

## 2011-02-25 LAB — DIFFERENTIAL
Basophils Absolute: 0 10*3/uL (ref 0.0–0.1)
Eosinophils Absolute: 0.4 10*3/uL (ref 0.0–0.7)
Eosinophils Relative: 6 % — ABNORMAL HIGH (ref 0–5)
Lymphocytes Relative: 37 % (ref 12–46)
Lymphs Abs: 2.2 10*3/uL (ref 0.7–4.0)
Neutrophils Relative %: 51 % (ref 43–77)

## 2011-02-25 LAB — CBC
HCT: 31.9 % — ABNORMAL LOW (ref 36.0–46.0)
Hemoglobin: 12.4 g/dL (ref 12.0–15.0)
MCHC: 33.8 g/dL (ref 30.0–36.0)
MCV: 88 fL (ref 78.0–100.0)
Platelets: 176 10*3/uL (ref 150–400)
Platelets: 195 10*3/uL (ref 150–400)
RDW: 13.8 % (ref 11.5–15.5)
RDW: 14.1 % (ref 11.5–15.5)
WBC: 6.1 10*3/uL (ref 4.0–10.5)

## 2011-02-25 LAB — CK: Total CK: 6577 U/L — ABNORMAL HIGH (ref 7–177)

## 2011-02-25 LAB — URINE MICROSCOPIC-ADD ON

## 2011-03-13 LAB — URINALYSIS, ROUTINE W REFLEX MICROSCOPIC
Glucose, UA: NEGATIVE mg/dL
Ketones, ur: NEGATIVE mg/dL
Leukocytes, UA: NEGATIVE
Nitrite: NEGATIVE
Protein, ur: 100 mg/dL — AB

## 2011-03-13 LAB — CBC
Hemoglobin: 11.1 g/dL — ABNORMAL LOW (ref 12.0–15.0)
MCHC: 35.5 g/dL (ref 30.0–36.0)
MCV: 86.2 fL (ref 78.0–100.0)
RDW: 13.3 % (ref 11.5–15.5)

## 2011-03-13 LAB — DIFFERENTIAL
Basophils Absolute: 0 10*3/uL (ref 0.0–0.1)
Basophils Relative: 0 % (ref 0–1)
Eosinophils Absolute: 0.1 10*3/uL (ref 0.0–0.7)
Eosinophils Relative: 1 % (ref 0–5)
Monocytes Absolute: 0.6 10*3/uL (ref 0.1–1.0)
Neutro Abs: 6.7 10*3/uL (ref 1.7–7.7)

## 2011-03-13 LAB — GLUCOSE, CAPILLARY: Glucose-Capillary: 146 mg/dL — ABNORMAL HIGH (ref 70–99)

## 2011-03-13 LAB — POCT CARDIAC MARKERS: CKMB, poc: 1.1 ng/mL (ref 1.0–8.0)

## 2011-03-13 LAB — BASIC METABOLIC PANEL
CO2: 25 mEq/L (ref 19–32)
Calcium: 9 mg/dL (ref 8.4–10.5)
Glucose, Bld: 146 mg/dL — ABNORMAL HIGH (ref 70–99)
Sodium: 137 mEq/L (ref 135–145)

## 2011-03-15 ENCOUNTER — Ambulatory Visit (INDEPENDENT_AMBULATORY_CARE_PROVIDER_SITE_OTHER): Payer: Medicare Other | Admitting: Internal Medicine

## 2011-03-15 DIAGNOSIS — R195 Other fecal abnormalities: Secondary | ICD-10-CM

## 2011-03-15 DIAGNOSIS — K6289 Other specified diseases of anus and rectum: Secondary | ICD-10-CM

## 2011-03-19 LAB — DIFFERENTIAL
Basophils Relative: 0 % (ref 0–1)
Lymphs Abs: 1.8 10*3/uL (ref 0.7–4.0)
Monocytes Relative: 5 % (ref 3–12)
Neutro Abs: 2.9 10*3/uL (ref 1.7–7.7)
Neutrophils Relative %: 56 % (ref 43–77)

## 2011-03-19 LAB — CBC
HCT: 34.3 % — ABNORMAL LOW (ref 36.0–46.0)
Hemoglobin: 11.4 g/dL — ABNORMAL LOW (ref 12.0–15.0)
MCHC: 33.1 g/dL (ref 30.0–36.0)
MCV: 88.3 fL (ref 78.0–100.0)
RDW: 14 % (ref 11.5–15.5)

## 2011-03-19 LAB — COMPREHENSIVE METABOLIC PANEL
BUN: 24 mg/dL — ABNORMAL HIGH (ref 6–23)
CO2: 28 mEq/L (ref 19–32)
Calcium: 9.3 mg/dL (ref 8.4–10.5)
Creatinine, Ser: 1.44 mg/dL — ABNORMAL HIGH (ref 0.4–1.2)
GFR calc non Af Amer: 36 mL/min — ABNORMAL LOW (ref >60)
Glucose, Bld: 210 mg/dL — ABNORMAL HIGH (ref 70–99)
Total Protein: 6.6 g/dL (ref 6.0–8.3)

## 2011-03-20 ENCOUNTER — Other Ambulatory Visit (HOSPITAL_COMMUNITY): Payer: Self-pay | Admitting: Oncology

## 2011-03-20 DIAGNOSIS — Z139 Encounter for screening, unspecified: Secondary | ICD-10-CM

## 2011-03-29 ENCOUNTER — Encounter (HOSPITAL_COMMUNITY): Payer: Medicare Other

## 2011-04-03 ENCOUNTER — Encounter (HOSPITAL_COMMUNITY): Payer: Medicare Other

## 2011-04-04 ENCOUNTER — Encounter (INDEPENDENT_AMBULATORY_CARE_PROVIDER_SITE_OTHER): Payer: Self-pay | Admitting: General Surgery

## 2011-04-06 ENCOUNTER — Other Ambulatory Visit: Payer: Self-pay | Admitting: Cardiology

## 2011-04-08 NOTE — Consult Note (Addendum)
[name], [name]                  ACCOUNT NO.:  [id number]  MEDICAL RECORD NO.:  [id number]           PATIENT TYPE:  LOCATION:                                 FACILITY:  PHYSICIAN:  Hildred Laser, M.D.    DATE OF BIRTH:  Dec 20, 1932  DATE OF CONSULTATION:  03/15/2011 DATE OF DISCHARGE:                                CONSULTATION   REFERRING PHYSICIAN:  Burnard Bunting, PA-C.  PRESENTING COMPLAINT.  Rectal pain.  HISTORY OF PRESENT ILLNESS:  [name] is a 77 year old white female, referred to our office by Burnard Bunting, PA-C, for rectal pain.  [name] states that she has had a steady pain in her rectum for 6 months.  She describes the pain as a menstrual period-type pain.  She says she has this pain about once or twice a month.  Her last episode of rectal pain was about a month ago.  She also complains of fecal incontinence which she has had for over 5 years.  She especially notices fecal incontinence at night.  She says her stools are loose and watery.  She states her fecal incontinence has progressively worsened over the past 4 months. [name] does have difficulty walking.  She is in a wheelchair at home.  Her last colonoscopy was in 2005.  She underwent a colonoscopy for mild anemia and heme-positive stools.  The colonoscopy revealed a few diverticula of the sigmoid colon.  Small external hemorrhoids, otherwise normal colonoscopy.  Dr. Laural Golden performed the procedure.  She says her appetite has been good, there has been no weight loss.  She denies any melena or bright red rectal bleeding.  There is no dysphagia and no nausea, vomiting.  No known allergies.  HOME MEDICATIONS: 1. Fenofibrate 160 mg. 2. Paroxetine 40 mg. 3. Glipizide 10 mg. 4. Tramadol 50 mg. 5. Hydrocodone/APAP p.r.n. 6. Lyrica 50 mg. 7. Clobetasol 0.05. 8. FluLaval. 9. Metoprolol 50 mg. 10.Protonix 40 mg. 11.Crestor 40 mg. 12.Diazepam 10 mg as needed. 13.Lomotil p.r.n. These medications were from a  list.  PAST SURGICAL HISTORY:  She has had a hysterectomy, cholecystectomy. She has had two back surgeries and a right breast mastectomy.  She has a history of iron-deficiency anemia, hypertension, type 2 diabetes x30 years, high cholesterol, fibromyalgia, spinal stenosis, depression, and she suffered a fractured pelvis and broke her left arm a year ago.  FAMILY HISTORY:  Her mother is deceased from natural causes.  Her father is deceased from an MI.  Two sisters, one deceased from CHF, one is alive in good health.  Two brothers are deceased, one from an MI and one from CHF.  She is widowed.  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  She does not smoke, drink, or do drugs.  She has four children, one was stillborn; one, her cause of death was unknown, they suspected an overdose at age 2; and two are in good health.  OBJECTIVE:  VITAL SIGNS:  Her weight is 225.  Her blood pressure is 142/72, her temperature is 97, her pulse is 72, her height is 5 feet 10 inches. HEENT:  She  is edentulous in her upper jaw.  She has natural teeth in her lower.  Her oral mucosa is moist.  There are no lesions.  Her conjunctivae are pink.  Her sclerae are anicteric. NECK:  Her thyroid is normal.  There is no cervical lymphadenopathy. LUNGS:  Clear. HEART:  Regular rate and rhythm. ABDOMEN:  Obese.  Bowel sounds are positive.  No masses felt. RECTAL:  Her stool was brown and it was guaiac positive.  ASSESSMENT:  [name] is a 77 year old female presenting today with rectal pain off and on for the past 6 months.  She also complains of fecal incontinence which she has had for over 5 years and this occurs especially at night.  I suspect part of her fecal incontinence is related to her immobility, however, I think that a colonic neoplasm or proctitis needs to be ruled out considering her stool is heme positive today.  RECOMMENDATIONS:  I would like to start her on dicyclomine 10 mg twice  a day.  I have advised her to cut back to one a day if she becomes constipated and we will schedule a colonoscopy  in the near future. Risk and benefits were reviewed with the patient and she is agreeable. Thank you for allowing [place] to participate in the care of this very nice lady.    ______________________________ Deberah Castle, NP   ______________________________ Hildred Laser, M.D.    TS/MEDQ  D:  03/15/2011  T:  03/16/2011  Job:  [id number]  cc:   Burnard Bunting, [facility]  Electronically Signed by Deberah Castle PA on 04/10/2011 11:56:50 AM Electronically Signed by Hildred Laser M.D. on 04/30/2011 06:32:21 PM

## 2011-04-11 ENCOUNTER — Ambulatory Visit (HOSPITAL_COMMUNITY)
Admission: RE | Admit: 2011-04-11 | Discharge: 2011-04-11 | Disposition: A | Discharge status: Home / Self Care | Payer: Medicare Other | Source: Ambulatory Visit | Attending: Internal Medicine | Admitting: Internal Medicine

## 2011-04-11 ENCOUNTER — Encounter (HOSPITAL_BASED_OUTPATIENT_CLINIC_OR_DEPARTMENT_OTHER): Payer: Medicare Other | Admitting: Internal Medicine

## 2011-04-11 DIAGNOSIS — R198 Other specified symptoms and signs involving the digestive system and abdomen: Secondary | ICD-10-CM

## 2011-04-11 DIAGNOSIS — I10 Essential (primary) hypertension: Secondary | ICD-10-CM | POA: Insufficient documentation

## 2011-04-11 DIAGNOSIS — E119 Type 2 diabetes mellitus without complications: Secondary | ICD-10-CM | POA: Insufficient documentation

## 2011-04-11 DIAGNOSIS — E785 Hyperlipidemia, unspecified: Secondary | ICD-10-CM | POA: Insufficient documentation

## 2011-04-11 DIAGNOSIS — K6289 Other specified diseases of anus and rectum: Secondary | ICD-10-CM

## 2011-04-11 DIAGNOSIS — K5730 Diverticulosis of large intestine without perforation or abscess without bleeding: Secondary | ICD-10-CM | POA: Insufficient documentation

## 2011-04-11 DIAGNOSIS — Z79899 Other long term (current) drug therapy: Secondary | ICD-10-CM | POA: Insufficient documentation

## 2011-04-11 DIAGNOSIS — K921 Melena: Secondary | ICD-10-CM | POA: Insufficient documentation

## 2011-04-11 DIAGNOSIS — R195 Other fecal abnormalities: Secondary | ICD-10-CM

## 2011-04-11 LAB — GLUCOSE, CAPILLARY: Glucose-Capillary: 259 mg/dL — ABNORMAL HIGH (ref 70–99)

## 2011-04-17 NOTE — Procedures (Signed)
[name], [name]                  ACCOUNT NO.:  [id number]   MEDICAL RECORD NO.:  [id number]          PATIENT TYPE:  REC   LOCATION:  SP[facility]                          FACILITY:  [facility]   PHYSICIAN:  Cristopher Estimable. Lattie Haw, MD, FACCDATE OF BIRTH:  [birth year]   DATE OF PROCEDURE:  12/22/2007  DATE OF DISCHARGE:  01/07/2008                                ECHOCARDIOGRAM   CLINICAL DATA:  A 77 year old woman with dyspnea and breast cancer.   M-MODE TRACING:  Aorta 2.9.  Left atrium 5.1.  Septum 1.6.  Posterior wall 1.4.  LV diastole 5.0.  LV systole 4.7.   RESULTS:  1. Technically, adequate echocardiographic study.  2. Mild to moderate right atrial enlargement; moderate left atrial      enlargement.  3. Normal right ventricular size and function; borderline RVH.  4. Mild sclerosis of the tri-leaflet aortic valve.  5. Normal diameter of the proximal ascending aorta; mild calcification      of the wall and annulus.  6. Normal tricuspid valve; mild regurgitation; very mild elevation in      estimated RV systolic pressure.  7. Normal pulmonic valve and proximal pulmonary artery.  8. Normal mitral valve; moderate annular calcification; mild to      moderate regurgitation.  9. Left ventricular size at the upper limit of normal; mild LVH; LV      function is low normal with an estimated ejection fraction of      0.5/0.55.  10.Inferior vena cava at the upper limit of normal in diameter.      Diameter decreases normally with inspiration.  11.Comparison with prior study of October 07, 2007:  Increase in left      and right atrial size; further slight decrease in left ventricular      systolic function.      Cristopher Estimable. Lattie Haw, MD, [facility]  Electronically Signed     RMR/MEDQ  D:  12/23/2007  T:  12/23/2007  Job:  [id number]

## 2011-04-17 NOTE — Discharge Summary (Signed)
[name], [name]                  ACCOUNT NO.:  [id number]   MEDICAL RECORD NO.:  [id number]          PATIENT TYPE:  OIB   LOCATION:  5739                         FACILITY:  [facility]   PHYSICIAN:  Sammuel Hines. Daiva Nakayama, M.D. DATE OF BIRTH:  1933/10/13   DATE OF ADMISSION:  05/07/2007  DATE OF DISCHARGE:  05/10/2007                               DISCHARGE SUMMARY   [name] is a 77 year old white female who was brought in on June 4.  She had a history of inflammatory right breast cancer and received  neoadjuvant therapy.  She underwent a modified radical mastectomy and  axillary lymph node dissection.  Postoperatively, her main issues were  pain control.  Her diet was advanced and she tolerated this well.  She  required a prolonged stay in the hospital because of pain control, some  weakness and not having anybody at home to help her take care of  herself, but by June 7, she was ambulating and able to take care of  herself and was ready for discharge home.   MEDICATIONS:  She was to resume her home medications and she was given a  prescription for pain medicine.   ACTIVITY:  No heavy lifting.   DIET:  As tolerated.   FINAL DIAGNOSIS:  Inflammatory right breast cancer.   FOLLOWUP:  Will be with Dr. Marlou Starks in 2 weeks.   She is discharged home.      Sammuel Hines. Daiva Nakayama, M.D.  Electronically Signed     PST/MEDQ  D:  07/17/2007  T:  07/17/2007  Job:  [id number]

## 2011-04-17 NOTE — Procedures (Signed)
[name], [name]                  ACCOUNT NO.:  [id number]   MEDICAL RECORD NO.:  [id number]          PATIENT TYPE:  REC   LOCATION:                                FACILITY:  [facility]   PHYSICIAN:  Cristopher Estimable. Lattie Haw, MD, FACCDATE OF BIRTH:  1933/04/04   DATE OF PROCEDURE:  07/07/2007  DATE OF DISCHARGE:                                ECHOCARDIOGRAM   REFERRING:  Gaston Islam. Neijstrom, MD   CLINICAL DATA:  A 77 year old woman receiving chemotherapy.   Aorta 3.1, left atrium 4.3, septum 1.4, posterior wall 1.4, LV diastole  4.3, LV systole 3.4.   1. Technically adequate echocardiographic study.  2. Mild left atrial enlargement; normal right atrium and right      ventricle.  3. Mild aortic valvular sclerosis; mild insufficiency and minimal      stenosis by Doppler.  4. Normal diameter of the proximal ascending aorta with mild      calcification of the wall.  5. Normal pulmonic valve and proximal pulmonary artery.  6. Normal mitral valve; mild to moderate annular calcification; mild      to at worst moderate regurgitation; no stenosis.  7. Normal tricuspid valve with physiologic regurgitation and mild to      moderate elevation in estimated RV systolic pressure.  8. Normal left ventricular size; mild hypertrophy; normal regional and      global function; estimated ejection fraction 0.60-0.65.  9. Physiologic pericardial effusion.  10.Normal IVC.  11.Comparison with February 04, 2007:  Better-quality study; otherwise no      significant interval change.      Cristopher Estimable. Lattie Haw, MD, [facility]  Electronically Signed     RMR/MEDQ  D:  07/07/2007  T:  07/08/2007  Job:  [id number]

## 2011-04-17 NOTE — Procedures (Signed)
[name], [name]                  ACCOUNT NO.:  [id number]   MEDICAL RECORD NO.:  [id number]          PATIENT TYPE:  REC   LOCATION:  RAF                           FACILITY:  [facility]   PHYSICIAN:  Cristopher Estimable. Lattie Haw, MD, FACCDATE OF BIRTH:  31-Mar-1933   DATE OF PROCEDURE:  10/07/2007  DATE OF DISCHARGE:                                ECHOCARDIOGRAM   CLINICAL DATA:  77 year old woman receiving chemotherapy for carcinoma  of the breast.  M-mode aorta 3.0, left atrium 4.9, septum 1.4, posterior  wall 1.3, LV diastole 5.1, LV systole 3.9.   1. Technically adequate echocardiographic study.  2. Mild left atrial enlargement; normal right atrium.  3. The right ventricle is prominent but not frankly dilated; mild RVH;      normal RV systolic function.  4. Mild aortic valvular sclerosis; normal diameter of the proximal      ascending aorta with mild calcification of the wall.  5. Normal mitral valve; moderate annular calcification; mild      regurgitation.  6. Suboptimal imaging of the pulmonic valve and proximal pulmonary      artery suggests no structural abnormalities.  7. Normal tricuspid valve with physiologic regurgitation and mild      elevation in estimated RV systolic pressure.  8. Left ventricular size at the upper limit of normal; mild LVH; no      segmental wall motion abnormalities.  Overall LV systolic function      is low normal with an estimated ejection fraction of 0.55.  9. Normal IVC.  10.Comparison with prior study of July 07, 2007:  No definite      interval change; estimated ejection fraction is slightly lower, but      not significantly so.      Cristopher Estimable. Lattie Haw, MD, [facility]  Electronically Signed     RMR/MEDQ  D:  10/07/2007  T:  10/08/2007  Job:  [id number]   cc:   Gaston Islam. Tressie Stalker, MD  Fax: [phone]

## 2011-04-17 NOTE — Op Note (Signed)
[name], [name]                  ACCOUNT NO.:  [id number]   MEDICAL RECORD NO.:  [id number]          PATIENT TYPE:  AMB   LOCATION:  SDS                          FACILITY:  [facility]   PHYSICIAN:  Sammuel Hines. Daiva Nakayama, M.D. DATE OF BIRTH:  [birth year]   DATE OF PROCEDURE:  05/07/2007  DATE OF DISCHARGE:                               OPERATIVE REPORT   PREOPERATIVE DIAGNOSIS:  Inflammatory right breast cancer.   POSTOPERATIVE DIAGNOSIS:  Inflammatory right breast cancer.   PROCEDURE:  Right modified radical mastectomy and axillary lymph node  dissection.   SURGEON:  Sammuel Hines. Daiva Nakayama, M.D.   ASSISTANT:  Haywood Lasso, M.D.   ANESTHESIA:  General endotracheal.   PROCEDURE:  After informed consent was obtained, the patient was brought  to the operating room, placed in the supine position on the operating  room table.  After adequate induction of general anesthesia, the  patient's right chest and axilla were prepped with Betadine and draped  in usual sterile manner.  A elliptical incision was made around the  nipple-areolar complex in a fashion to minimize the amount of excess  skin.  This incision was made superior and then inferior to the nipple-  areolar complex from the medial side of the breast to the lateral side  of the breast with a 10 blade knife.  Skin hooks were then used to  elevate the skin towards the ceiling and thin skin flaps were created  sharply with the electrocautery.  These were taken down to the chest  wall circumferentially around the superior and inferior skin flap.  The  dissection was then carried down into the axilla laterally until the  latissimus muscle was identified.  The breast itself was then removed  from the chest wall with the pectoralis fascia.  This was also done  sharply with the electrocautery.  Once the dissection was carried  lateral to the axilla, the intercostobrachial nerves were divided  sharply with the electrocautery.  The blunt  dissection was carried along  the chest wall and the thoracodorsal and long thoracic nerves were  identified.  All of the lymph node tissue from the axillary vein  inferiorly down to the chest wall was then removed by combination of  sharp dissection with the either the electrocautery or Metzenbaum  scissors for blunt right angle dissection.  One larger vein had to be  clamped with a right-angle clamp, divided and ligated with a 2-0 silk  ties.  The rest of the smaller vessels in this area were controlled with  medium vascular clips.  Once this dissection was done and performed down  to the latissimus muscle, then the axillary contents were removed en  bloc with the breast and the specimen was marked with the stitch on the  lateral aspect of the right breast and sent to pathology for further  evaluation.  Hemostasis was achieved using Bovie electrocautery in  several places.  The wound was then irrigated copious amounts of saline,  and the area all looked hemostatic.  Two small stab incisions were made  along  the anterior axillary line inferiorly.  A hemostat was placed  through each of these incisions into the wound.  The 19-French round  Blake drains were then brought through these openings.  The drains were  laid in the axilla and along the chest wall.  The drains were anchored  to the skin with 3-0 nylon stitches.  The superior skin flap was then  anchored to the chest wall with a couple of interrupted 3-0 Vicryl  stitches.  The subcutaneous tissue and dermis were then reapproximated  along the chest wall of the superior and inferior flap.  This was also  done with interrupted 3-0 Vicryl stitches and then the incision itself  was closed with staples.  The drains were placed to suction and had a  good seal.  The skin flaps looked good and healthy.  Xeroform and  sterile dressings were then applied to the incision.  The patient  tolerated the procedure well.  At the end of the  case all  needle, sponge and instrument counts were correct.  The patient  was then awakened and taken to the recovery room in stable condition.  Of note, the thoracodorsal and long thoracic nerves were identified and  maintained out of the field of the operation.      Sammuel Hines. Daiva Nakayama, M.D.  Electronically Signed     PST/MEDQ  D:  05/07/2007  T:  05/07/2007  Job:  [id number]

## 2011-04-20 NOTE — H&P (Signed)
[name], [name]                  ACCOUNT NO.:  [id number]   MEDICAL RECORD NO.:  [id number]          PATIENT TYPE:  INP   LOCATION:  A218                          FACILITY:  [facility]   PHYSICIAN:  Bonne Dolores, M.D.    DATE OF BIRTH:  22-Apr-1933   DATE OF ADMISSION:  04/03/2007  DATE OF DISCHARGE:  LH                              HISTORY & PHYSICAL   CHIEF COMPLAINT:  Severe weakness and diarrhea.   HISTORY OF PRESENT ILLNESS:  This is a 77 year old female with multiple  medical problems.  She was last admitted in January of this year after  having suffered chest pain.  Rule out MI protocol was negative and she  has had no recurrence.  She also has some diabetes mellitus, adult  onset, currently on Lantus insulin.  She had peripheral vascular  disease, hypertension, renal insufficiency, history of neuropathy,  chronic pain syndrome secondary to degenerative disk disease, lower  lumbar spine,  status post proceed approximately two years ago.   The patient was diagnosed with breast cancer in December 2007.  She had  a positive sentinel node biopsy and apparently is scheduled for  bilateral mastectomies.  She has undergone chemotherapy on multiple  occasions administered by Dr. Tressie Stalker. Apparently surgery has been  postponed due to an illness in the surgeon himself and she is supposed  to have surgery in late May.   Most recently the patient was administered a dose of unknown  chemotherapeutic agent by Dr. Tressie Stalker.  She has developed increasingly  severe weakness and diarrhea within the past several days.  The patient  states she has had problems with diarrhea on multiple occasions in the  past several years.   Apparently,  the patient fell in her bathtub and EMS was summoned and  she was brought to the hospital.  Emergency room workup revealed  significant pancytopenia with a white count of 1300 with 12 neutrophils,  only 48 monocytes, hemoglobin 9.2, hematocrit 26 and a  platelet count  200,000.  Chemistries were significant for a glucose of 341,  compensatory hyponatremia with a sodium 129, BUN 29, creatinine 1.9.  Urine is clear, specific gravity greater than 1.030.  No white cells.  A  CT scan of the brain was performed which did not reveal any acute  injury.  There is no acute intracranial abnormality.  She did have a  left maxillary sinusitis apparent.  She also has a chest x-ray which was  negative.  She was complaining shoulder pain on the right shoulder and  her x-ray was unremarkable to the right shoulder x-ray was unremarkable.   A daughter-in-law was present in the room who lives across the street,  and she states she is somewhat unreliable in regards to her scheduled  medications and p.r.n. pain meds and has on occasion taken double doses  of Ambien for sleep and possibly her hydrocodone though this is  difficult documented.  Upon further questioning, the patient states her  right arm is weak.   The patient denies any headache, nausea, vomiting, melena, hematemesis,  hematochezia or  genitourinary symptoms.  She also has abdominal pain,  chest pain, shortness of breath, syncope, palpitations and any other  neurologic deficits.   CURRENT MEDICATIONS:  1. Diazepam 10 mg p.r.n.  2. Albuterol via nebs q. four p.r.n.  3. Lorcet 10 one q.4-6h. p.r.n. pain.  4. Glucotrol 10 mg b.i.d.  5. Lomotil p.r.n.  6. Metronidazole 250 p.r.n. diarrhea.  7. Lantus 16 units at bedtime.  8. Metoprolol tartrate 50 mg b.i.d.  9. Nexium 40 daily.  10.Paxil 40 daily.  11.Tussionex p.r.n.  12.Carafate p.r.n.   ALLERGIES:  She had an allergy to [facility].   PAST HISTORY:  As noted above.   FAMILY HISTORY:  Noncontributory.   SOCIAL HISTORY:  As noted.  The patient does live alone.  Nonsmoker,  nondrinker.   PHYSICAL EXAMINATION:  GENERAL:  Very pleasant female who is has no hair  due to chemotherapy.  She is alert and oriented at this time.  VITAL SIGNS:   Blood pressure 124/42, temperature 98.2, pulse 86 and  regular, respirations 20, oxygen saturation 100%.  HEENT:  Normocephalic and atraumatic.  Pupils are equal.  Ears, nose,  throat benign.  There is no facial droop.  NECK:  Supple.  No bruits, thyromegaly, lymphadenopathy or masses.  LUNGS:  Clear.  HEART:  Heart sounds are normal without murmurs, rubs or gallops.  ABDOMEN:  Nontender, Nondistended.  Bowel sounds are intact.  EXTREMITIES:  No clubbing, cyanosis.  There is some bruising of the  right knee with good mobility.  NEUROLOGIC:  Exam is remarkable for some weakness of the right upper  extremity with intact reflexes.  Grip strength is 1-2/5.  Right lower  extremity strength is normal.   ASSESSMENT:  Fall in a female with multiple medical problems as noted  above.  She has marked thrombocytopenia as well as anemia secondary to  chemotherapy.  Mental status changes which were apparently transient die  to medication affect as she is lucid at this time.   PLAN:  Will obtain stool cultures and administer Flagyl empirically.  Continue home medications.  She is in need of social service consult for  possibly placement, or at minimum, home help to help manage her  medications and provide for basic needs.  Will ask Dr. Tressie Stalker  elucidate her care plan and also have GI see the patient and workup this  ongoing diarrhea.  Will institute neutropenic precautions and possibly  transfuse pending her progress.      Bonne Dolores, M.D.  Electronically Signed     MC/MEDQ  D:  04/03/2007  T:  04/04/2007  Job:  [id number]

## 2011-04-20 NOTE — Op Note (Signed)
[name], [name]                            ACCOUNT NO.:  [id number]   MEDICAL RECORD NO.:  [id number]                   PATIENT TYPE:  INP   LOCATION:  3023                                 FACILITY:  [facility]   PHYSICIAN:  Kary Kos, M.D.                     DATE OF BIRTH:  Feb 16, 1933   DATE OF PROCEDURE:  05/31/2004  DATE OF DISCHARGE:                                 OPERATIVE REPORT   PREOPERATIVE DIAGNOSIS:  Degenerative spondylolisthesis, L3-4, with severe  spinal stenosis, L3-4, and left greater than right L3 radiculopathy.   PROCEDURE:  Decompressive laminectomy, L3-4.  Posterior lumbar interbody  fusion, L3-4 using the 10 x  26 mm Tangent allograft wedges and the M10C Horizon pedicle screw system at  L2-4 with six 5x45 pedicle screws.  Posterolateral arthrodesis using locally  harvested autograft in L3-4 and open reduction of spinal deformity, L3-4.   SURGEON:  Kary Kos, M.D.   ASSISTANT:  Eustace Moore, MD   ANESTHESIA:  General endotracheal.   HISTORY OF PRESENT ILLNESS:  Patient is a very pleasant 77 year old female  who has had longstanding back and leg pain, predominantly in the left side  radiating down through her hip down the outside of her thigh to the top of  the knee.  This has been refractory to all forms of conservative treatment.  Imaging shows severe degenerative spondylolisthesis causing severe spinal  stenosis at L4 with biforaminal stenosis of the L3 nerve root.  After  failing all forms of conservative treatment, the patient was recommended to  decompressive stabilization procedure.  I subsequently went over the risks  and benefits of the surgery with her.  She understands and agrees to  proceed.   REPORT:  Patient was brought to the OR, was induced under general  anesthesia, was positioned prone on the Wilson frame, and the back was  prepped and draped in the usual sterile fashion.  Preoperatively localized  the L4 pedicle.  Then a midline  incision made after infiltration with 10 cc  of lidocaine with epi, and then Bovie electrocautery was used to take down  the incision subperiosteally and subsequently carried out in the lamina of  L3-4 out to the TP's of L3-4 bilaterally.  The self-retaining retractor was  placed, then Leksell rongeur was used to test the spinous process.  It was  noted to be hypermobile.  The facets were noted to be freely hypertrophied  and overgrown across the medial aspect of the lamina as well as after the  facet complexes were over-bitten with Leksell rongeur, noted to be markedly  degenerated and diastased.  Then the remaining spinous process of L3 was  removed and medial facet complexes were drilled down with a Leksell rongeur.  Then using a ___________ sponge, attempts were made to go underneath the  lamina.  It was noted to be markedly thickened  as well as thickened and  hypertrophied at the ligamentum flavum.  This was all teased away, under-  bitten, and the entire central decompression was completed, exposing the  thecal sac, which was compressed in an hour-glass format.  Then using a 4  Penfield, the left-sided undersurface of the facet complex was under-bitten,  and the entire medial aspect of the left side of the L3-4 facet was removed,  and the L3-4 nerve root was identified and rapidly decompressed up the  neural foramen.  It was noted to be marked stenotic from the hypertrophy  facet complex but after this was removed, they were widely decompressed.  On  the right side, there was noted to be severe hour-glass compression of the  thecal sac as well as severe adherence of the undersurface of the facet  complex and ligamentum flavum to the dura.  This was all teased away with a  4 Penfield and then underbitten with 3-4 mm Kerrison punches until the  entire medial facet complex at L3-4 on the right was removed and the L3-4  nerve roots on the right were radically decompressed up to their  neural  foramen.  Then all of the epidural branches were coagulated.  A D'Errico  nerve root retractor was used to reflect the L4 nerve root medially.  An  annulotomy was made.  A pituitary rongeur was used to clean out the  degenerated disk.  Then using a size 8 chisel, posterior osteophytes were  scraped off, and then a size 10 distractor was inserted.  The 10 distractor  had a good apposition of the end plates at this place, confirmed by  fluoroscopy, so it was decided to use 10 grafts.  The right-sided L4 nerve  root was reflected medially.  The disk spaces were cleaned out.  The size 10  cutter and chisel were used to repair the end plates.  A downgoing Epstein  curette was used to remove large fibers from the disk and central  compartment as well as lateral compartment and the end plate was scraped.  A  10 x 26 mm Tangent allograft was inserted on the right side 1-2 mm deep to  the posterior vertebral body line, confirmed by fluoroscopy.  This procedure  was repeated on the left side with locally harvested autograft compressed  against the right-sided allograft, then the left-sided allograft was  inserted.  After both allografts had been inserted and fluoroscopy confirmed  good position, attention was taken to the pedicle screw placement, and pilot  holes were drilled with a high-speed drill on the right at L4, cannulated  with the awl, probed, tapped with a 55 tap and 65 x 45 pedicle screw  inserted at L4.  The pedicles were probed from within the pedicles as well  as in the canal, confirming no mediolateral breach.  Fluoroscopy confirmed  good position within the cephalocaudal dimension of the pedicle as well as  trajectory.  This procedure was repeated at L3 on the right.  Again, 65 x 45  pedicle screw was inserted, and then also at L3-4 on the left.  All pedicle  screws had excellent purchase.  The pedicles were explored from within the canal and confirmed on medial breech as well  as from within the pedicle and  confirmed on lateral breech.  After all four pedicle screws were inserted  and the grafts had been placed, the wound was copiously irrigated.  Meticulous hemostasis was maintained.  Aggressive decortication was carried  out in the TPs at  L3-4 bilaterally, as well as the lateral facet complexes.  The remainder of the locally harvested allograft was packed in the lateral  gutters.  Then the 40 mm rods were sized, selected, and inserted,  tightened  down at L4 with the L3 pedicle screws compressed against L4.  With a  combination of pedicle screw compression and the bone graft placement  reduced the spondylolisthesis to about half its original confirmation.  Again, fluoroscopy confirmed good position of all plane screws, rods, and  bone graft.  Then Gelfoam was overlaid on top of the dura after the neural  foramina were re-explored, and the wound was copiously irrigated.  The  muscle and  the fascia were reapproximated with 0 interrupted Vicryl.  The  subcutaneous tissues were closed with 2-0 interrupted Vicryl, and the skin  was closed with a running 4-0 subcuticular and Steri-Strips applied.  Patient went to the recovery room in stable condition.                                               Kary Kos, M.D.    GC/MEDQ  D:  05/31/2004  T:  05/31/2004  Job:  [id number]

## 2011-04-20 NOTE — H&P (Signed)
[name], [name]                  ACCOUNT NO.:  [id number]   MEDICAL RECORD NO.:  [id number]          PATIENT TYPE:  INP   LOCATION:  A218                          FACILITY:  [facility]   PHYSICIAN:  Bonne Dolores, M.D.    DATE OF BIRTH:  03-21-1933   DATE OF ADMISSION:  04/03/2007  DATE OF DISCHARGE:  LH                              HISTORY & PHYSICAL   CHIEF COMPLAINT:  Severe weakness and diarrhea.   HISTORY OF PRESENT ILLNESS:  This is a 77 year old female with multiple  medical problems.  She was last admitted in January of this year after  having suffered chest pain.  Rule out MI protocol was negative and she  has had no recurrence.  She also has some diabetes mellitus, adult  onset, currently on Lantus insulin.  She had peripheral vascular  disease, hypertension, renal insufficiency, history of neuropathy,  chronic pain syndrome secondary to degenerative disk disease, lower  lumbar spine,  status post proceed approximately two years ago.   The patient was diagnosed with breast cancer in December 2007.  She had  a positive sentinel node biopsy and apparently is scheduled for  bilateral mastectomies.  She has undergone chemotherapy on multiple  occasions administered by Dr. Tressie Stalker. Apparently surgery has been  postponed due to an illness in the surgeon himself and she is supposed  to have surgery in late May.   Most recently the patient was administered a dose of unknown  chemotherapeutic agent by Dr. Tressie Stalker.  She has developed increasingly  severe weakness and diarrhea within the past several days.  The patient  states she has had problems with diarrhea on multiple occasions in the  past several years.   Apparently,  the patient fell in her bathtub and EMS was summoned and  she was brought to the hospital.  Emergency room workup revealed  significant pancytopenia with a white count of 1300 with 12 neutrophils,  only 48 monocytes, hemoglobin 9.2, hematocrit 26 and a  platelet count  200,000.  Chemistries were significant for a glucose of 341,  compensatory hyponatremia with a sodium 129, BUN 29, creatinine 1.9.  Urine is clear, specific gravity greater than 1.030.  No white cells.  A  CT scan of the brain was performed which did not reveal any acute  injury.  There is no acute intracranial abnormality.  She did have a  left maxillary sinusitis apparent.  She also has a chest x-ray which was  negative.  She was complaining shoulder pain on the right shoulder and  her x-ray was unremarkable to the right shoulder x-ray was unremarkable.   A daughter-in-law was present in the room who lives across the street,  and she states she is somewhat unreliable in regards to her scheduled  medications and p.r.n. pain meds and has on occasion taken double doses  of Ambien for sleep and possibly her hydrocodone though this is  difficult documented.  Upon further questioning, the patient states her  right arm is weak.   The patient denies any headache, nausea, vomiting, melena, hematemesis,  hematochezia or  genitourinary symptoms.  She also has abdominal pain,  chest pain, shortness of breath, syncope, palpitations and any other  neurologic deficits.   CURRENT MEDICATIONS:  1. Diazepam 10 mg p.r.n.  2. Albuterol via nebs q. four p.r.n.  3. Lorcet 10 one q.4-6h. p.r.n. pain.  4. Glucotrol 10 mg b.i.d.  5. Lomotil p.r.n.  6. Metronidazole 250 p.r.n. diarrhea.  7. Lantus 16 units at bedtime.  8. Metoprolol tartrate 50 mg b.i.d.  9. Nexium 40 daily.  10.Paxil 40 daily.  11.Tussionex p.r.n.  12.Carafate p.r.n.   ALLERGIES:  She had an allergy to [facility].   PAST HISTORY:  As noted above.   FAMILY HISTORY:  Noncontributory.   SOCIAL HISTORY:  As noted.  The patient does live alone.  Nonsmoker,  nondrinker.   PHYSICAL EXAMINATION:  GENERAL:  Very pleasant female who is has no hair  due to chemotherapy.  She is alert and oriented at this time.  VITAL SIGNS:   Blood pressure 124/42, temperature 98.2, pulse 86 and  regular, respirations 20, oxygen saturation 100%.  HEENT:  Normocephalic and atraumatic.  Pupils are equal.  Ears, nose,  throat benign.  There is no facial droop.  NECK:  Supple.  No bruits, thyromegaly, lymphadenopathy or masses.  LUNGS:  Clear.  HEART:  Heart sounds are normal without murmurs, rubs or gallops.  ABDOMEN:  Nontender, Nondistended.  Bowel sounds are intact.  EXTREMITIES:  No clubbing, cyanosis.  There is some bruising of the  right knee with good mobility.  NEUROLOGIC:  Exam is remarkable for some weakness of the right upper  extremity with intact reflexes.  Grip strength is 1-2/5.  Right lower  extremity strength is normal.   ASSESSMENT:  Fall in a female with multiple medical problems as noted  above.  She has marked thrombocytopenia as well as anemia secondary to  chemotherapy.  Mental status changes which were apparently transient die  to medication affect as she is lucid at this time.   PLAN:  Will obtain stool cultures and administer Flagyl empirically.  Continue home medications.  She is in need of social service consult for  possibly placement, or at minimum, home help to help manage her  medications and provide for basic needs.  Will ask Dr. Tressie Stalker  elucidate her care plan and also have GI see the patient and workup this  ongoing diarrhea.  Will institute neutropenic precautions and possibly  transfuse pending her progress.      Bonne Dolores, M.D.     MC/MEDQ  D:  04/03/2007  T:  04/04/2007  Job:  [id number]

## 2011-04-20 NOTE — Consult Note (Signed)
[name], [name]                  ACCOUNT NO.:  [id number]   MEDICAL RECORD NO.:  [id number]          PATIENT TYPE:  INP   LOCATION:  A218                          FACILITY:  [facility]   PHYSICIAN:  J. Sanjuana Kava, M.D. DATE OF BIRTH:  1933-10-01   DATE OF CONSULTATION:  DATE OF DISCHARGE:                                 CONSULTATION   REFERRING PHYSICIAN:  Patient is seen at the request of Dr. Caron Presume.   HISTORY OF PRESENT ILLNESS:  Patient is a 77 year old female with  complaints of pain and tenderness in her right shoulder. She has seen  Dr. Aline Brochure in the past for right shoulder pain and he gave her an  injection. She did well from that and recently she has had a fall and  some decreased range of motion of her shoulder on the right with some  pain in abduction and forward flexion. She has noticed that she is  weaker. X-rays of the shoulder were negative. She had x-rays of her head  which were negative as well the other day. The patient has a history of  breast cancer with positive sentinel node. She has had chemotherapy by  Dr. Tressie Stalker. Surgery has been postponed of her breast until late May.  Recent history of anemia and GI problems.   PHYSICAL EXAMINATION:  Patient's right shoulder on forward flexion of  about 50 degrees before she has pain and then she has great difficulty  trying to move it. Abduction is to about 40 to 45. Internal rotation is  full, external rotation is 15, extension is 10. Adduction appears to be  full. Neurovascular is intact. There is no crepitus. No increased  warmth. Patient is on neutropenia precautions.   ASSESSMENT:  Concerned about rotator cuff tear on the right shoulder.   PLAN:  She is scheduled for an MRI of the right shoulder. This will also  be helpful to make sure there is no metastasis to the humeral head area.  Patient has neutropenia and __________ consideration intraarticular  steroid injection at this time. I will await the results  of the MRI.           ______________________________  J. Sanjuana Kava, M.D.     JWK/MEDQ  D:  04/05/2007  T:  04/05/2007  Job:  [id number]   cc:   Bonne Dolores, M.D.  Fax: [phone]

## 2011-04-20 NOTE — Procedures (Signed)
NAMEISEL, SKUFCA                  ACCOUNT NO.:  [id number]   MEDICAL RECORD NO.:  [id number]          PATIENT TYPE:  REC   LOCATION:  RAD                           FACILITY:  [facility]   PHYSICIAN:  Fay Records, MD, FACCDATE OF BIRTH:  1933/09/13   DATE OF PROCEDURE:  11/18/2006  DATE OF DISCHARGE:                                ECHOCARDIOGRAM   REFERRING PHYSICIAN:  Dr. Tressie Stalker.   TEST INDICATIONS:  This 77 year old being evaluate for chemotherapy.   A 2-D echo with echo Doppler.  Left ventricle is normal in size with an  end-diastolic dimension of 14 mm, interventricular septum is thickened  at 17 mm, posterior wall at 14 mm.   Left atrium is normal at 40 mm.  Right atrium and right ventricle are  normal.   The aortic root is normal.   The aortic valve is mildly thickened not stenotic.  No insufficiency  noted.  Mitral valve is mildly thickened with annular calcification.  There is mild insufficiency (1/4).  Tricuspid valve is normal with trace  insufficiency.  Pulmonic valve is grossly normal.   Overall LV systolic function is normal.  Best views are in the short  axis.  Apical is difficult acoustic windows.  LVEF is approximately 65%.   RVEF is normal.  RV size is normal.  No mild RVH.   No mild diastolic dysfunction.   No pericardial effusion is seen.      Fay Records, MD, [facility]  Electronically Signed     PVR/MEDQ  D:  11/18/2006  T:  11/18/2006  Job:  [id number]

## 2011-04-20 NOTE — Consult Note (Signed)
[facility]  Patient:    [name], [name] Visit Number: [id number] MRN: [id number]          Service Type: RHE Location: [facility] Attending Physician:  Lindaann Slough Dictated by:   Lindaann Slough, M.D. Proc. Date: 09/04/01 Admit Date:  09/04/2001   CC:         Arther Abbott, M.D., [place]   Consultation Report  CHIEF COMPLAINT:  Referral by Dr. Aline Brochure.  Dear [name]:  Thank you for this consultation.  [name] is a 77 year old white female who was diagnosed with fibromyalgia in about 1990 by Dr. Justine Null.  She is here today because in August she was having left hand swelling and a nodule to the third PIP.  She reports that a few days after July 15, 2001, the pain improved. At that point, she was having difficulty closing the hand.  The hand was not warm or swollen.  The pain radiated up into the left forearm.  Her worst area at this time is pain across the shoulder and neck.  She has had x-rays in the past and says that there is degenerative arthritis.  She has some achiness to the arms, back, shoulders, and legs.  She has known problems with her shoulders which Dr. Aline Brochure has helped her with.  She received a right shoulder injection on July 15, 2001, and this has improved.  REVIEW OF SYSTEMS:  She is stiff in the mornings for about 10 minutes mostly with the back.  She does not have significant insomnia and says, "I sleep fine."  Her energy level is not good.  She feels that her weight is overall stable, but it has decreased significantly from her maximum weight of almost 300 pounds.  She has had some itching to the back but no rash or fever.  She has had headaches to return again.  There has been no significant diarrhea, constipation, blood, or mucus of bowel movement.  She denies chest pain, shortness of breath.  PAST MEDICAL/SURGICAL HISTORY:  Diabetes for approximately 15 years.  She has some peripheral neuropathy,  hypercholesterolemia, right rotator cuff tear, hypertension, irritable bowel syndrome, mini stroke in 1978, tonsillectomy, cholecystectomy, hysterectomy, bladder tack in the 1990s.  MEDICATIONS: 1. Diazepam b.i.d. 2. Hydrocodone 5/500 q.d. 3. Prozac 30 mg q.d. 4. Prinivil 40 mg q.d. 5. Glyburide 6 mg q.d. 6. Ambien 10 mg h.s. p.r.n. 7. Zocor q.d. 8. Fioricet p.r.n. 9. Lomotil p.r.n.  DRUG INTOLERANCES:  None.  FAMILY HISTORY:  Her father died at age 79 with a heart attack.  Her mother died at age 45 of "old age."  Her mother had some type of arthritis.  SOCIAL HISTORY:  She lives in [place] with her husband.  He is an invalid with lung cancer and has had a tracheostomy.  She has a son with ALS and another son who has recently been diagnosed with lung cancer also.   She lives with her husband, a grandson and his wife, and a small child.  She lost a daughter in 61.  She stopped smoking in 1983 and does not drink alcohol.  PHYSICAL EXAMINATION:  VITAL SIGNS:  Weight 237 pounds.  Blood pressure 150/88, respirations 16, pulse 68.  GENERAL:  She is in no distress and is somewhat tired appearing.  SKIN:  Clear.  HEENT:  PERRL/EOMI.  Mouth clear.  NECK:  Negative JVD.  LUNGS:  Clear.  HEART:  Regular with no murmur.  ABDOMEN:  Negative hepatosplenomegaly, nontender.  MUSCULOSKELETAL:  On the left third PIP, there is a slight nonspecific nodule. There is really no swelling to the hands, and they are cool and nontender. Wrists and elbows have good range of motion.  Shoulders have some stiffness and decreased internal and external rotation.  She is only able to abduct the right shoulder to about 90 degrees and abducts the left shoulder to about 110 degrees.  Trigger points at the elbows, shoulders, neck, and occiput were moderately tender.  She is tender around the flat part of the trapezius. Palpation along the paraspinous muscles was not tender, and she said that  this felt good.  Hips, knees, ankles, and feet had good range of motion and showed no synovitis.  NEUROLOGIC:  Negative straight leg raise.  The DTRs were absent at the ankles and 2+ at the biceps and knees.  Strength 5/5.  ASSESSMENT/PLAN: 1. Mild polyarthralgia and insomnia.  Although she is not complaining of    significant insomnia, I still have given her prescription to use Flexeril    at night.  I think this can help with her main area of achiness which is    around the shoulder and neck.  Because of the potential of making her    drowsy, I think using it at night is the best time.  She will start with a    small amount and might increase it to 10 to 20 mg h.s. 2. History of left hand and wrist swelling.  This is improved at this point.    She is really not showing arthritic swelling at the present time. 3. Bilateral shoulder difficulties.  The shoulders are improved at this    point. 4. Hypertension. 5. Diabetes. 6. Hysterectomy.  [name], I appreciate being able to help in Ms. Beard care.  I think she has fairly mild fibromyalgia-type symptoms at this point.  She does have the significant tendinitis that you have been working with her.  She is under a great deal of financial and emotional strain at this point.  She was saying that she is having difficulty buying her medicines.  Hopefully, the Flexeril will not add significantly to her medicine cost.  She felt that it would be best for her to call me for her next appointment.  I believe this is satisfactory.  I will be glad to continue working with her. Dictated by:   Lindaann Slough, M.D. Attending Physician:  Lindaann Slough DD:  09/04/01 TD:  09/04/01 Job: [id number]

## 2011-04-20 NOTE — Discharge Summary (Signed)
[name], [name]                  ACCOUNT NO.:  [id number]   MEDICAL RECORD NO.:  [id number]          PATIENT TYPE:  INP   LOCATION:  8250                         FACILITY:  [facility]   PHYSICIAN:  Leeroy Cha, M.D.   DATE OF BIRTH:  [birth year]   DATE OF ADMISSION:  08/15/2005  DATE OF DISCHARGE:  08/23/2005                                 DISCHARGE SUMMARY   ADMISSION DIAGNOSIS:  Status post fusion at the level of L3-L4, L4-L5  spondylolisthesis with kyphosis.   DISCHARGE DIAGNOSIS:  Status post fusion at the level of L3-L4, L4-L5  spondylolisthesis with kyphosis.   CLINICAL HISTORY:  Ms. Luster underwent surgery a year ago at the level of L3-  L4 by a different surgeon.  She came to my office complaining of back pain  with radiation to both legs.  X-rays showed that she has spondylolisthesis  with kyphosis at the level of L4-L5.  Surgery was advised.  The patient has  a history of diabetes mellitus and hypertension.   LABORATORY DATA:  Hemoglobin 9.6, white count 6.9, glucose 115.   HOSPITAL COURSE:  The patient was taken to surgery and L4-L5 fusion with  augmentation of the fusion from L3 was done.  After surgery, the patient was  sleepy with lack of energy.  She was found to be anemic and she was  transfused.  Today, she is doing much better.  She is ambulating.  The wound  looks fine.  She has no fever.  Her vital signs are normal.  The patient had  been seen by the psychiatrist as well as by the hospitalist.  The wound  looks fine and she is ready to go home.   CONDITION ON DISCHARGE:  Improved.   DISCHARGE MEDICATIONS:  Vicodin and Flexeril.  She is going to continue  taking her medication for her diabetes.  During the admission, her blood  pressure has been completely normal and she is off medication for high blood  pressure.   DISCHARGE INSTRUCTIONS:  She is going to be seen by me in four weeks.  She  was also advised to be seen by her medical doctor to see if, indeed,  she  needs the medication for diabetes as well as the blood pressure.  Activities:  She is not to drive.  She is not to do any lifting.           ______________________________  Leeroy Cha, M.D.     EB/MEDQ  D:  08/23/2005  T:  08/23/2005  Job:  [id number]

## 2011-04-20 NOTE — H&P (Signed)
[name], SHUTTER                  ACCOUNT NO.:  [id number]   MEDICAL RECORD NO.:  [id number]          PATIENT TYPE:  INP   LOCATION:  NA                           FACILITY:  [facility]   PHYSICIAN:  Leeroy Cha, M.D.   DATE OF BIRTH:  [birth year]   DATE OF ADMISSION:  08/15/2005  DATE OF DISCHARGE:                                HISTORY & PHYSICAL   Ms. Ferrara is a lady who underwent fusion at the level of L3-L4 by Dr. Saintclair Halsted.  Now, later on, she is complaining of back with radiation into both legs.  She has been having conservative treatment.  She had an evaluation by an  orthopedic surgeon and in view of the findings she was sent to [place] for  further evaluation.   PAST MEDICAL HISTORY:  1.  Hysterectomy.  2.  Cholecystectomy.  3.  Appendectomy.  4.  Eye surgery.  5.  Spinal fusion.   SOCIAL HISTORY:  Negative.   FAMILY HISTORY:  There is a history of heart disease in her family.   REVIEW OF SYSTEMS:  Positive for ringing in the ears, high blood pressure,  high cholesterol, indigestion, incontinence, difficulty urinating, back  pain, problems with memory, weakness, and anxiety.   PHYSICAL EXAMINATION:  GENERAL:  When the patient came into my office, she  walked a short step.  HEENT:  Normal.  NECK:  Normal.  LUNGS:  Clear.  HEART:  Heart sounds normal.  ABDOMEN:  Normal.  EXTREMITIES:  Normal pulses.  NEUROLOGIC:  She has weakness, especially in both feet.  Straight-leg-  raising is approximately 45 degrees.   X-rays show a fusion at the level of L3-L4.  At the level of L4-L5, she has  kyphosis with spondylolisthesis which gets worse with flexion and extension.   IMPRESSION AND PLAN:  L4-L5 kyphosis with spondylolisthesis and is status  post spinal fusion at L3-L4.  The patient is being admitted.  We are going  to proceed with a laminectomy at L4, diskectomy at L4-L5 and interbody  fusion with pedicle screws.  The patient has no risks such as infection, CSF  leak or  paralysis and __________ whatsoever.           ______________________________  Leeroy Cha, M.D.     EB/MEDQ  D:  08/15/2005  T:  08/15/2005  Job:  [id number]

## 2011-04-20 NOTE — Op Note (Signed)
[name], [name]                  ACCOUNT NO.:  [id number]   MEDICAL RECORD NO.:  [id number]          PATIENT TYPE:  AMB   LOCATION:  DAY                          [facility]ACILITY[facility]  [facility]   PHYSICIAN:  Sammuel Hines. Daiva Nakayama, M.D. DATE OF BIRTH:  1933/10/01   DATE OF PROCEDURE:  11/27/2006  DATE OF DISCHARGE:                               OPERATIVE REPORT   PREOPERATIVE DIAGNOSIS:  Right breast cancer.   POSTOP DIAGNOSIS:  Right breast cancer.   PROCEDURE:  Placement of left subclavian vein Port-A-Cath.   SURGEON:  Dr. Marlou Starks.   ANESTHESIA:  General endotracheal.   PROCEDURE:  After informed consent was obtained, the patient was brought  to the operating room and placed in the supine position on operating  room table.  After adequate induction of general anesthesia, the  patient's left chest was prepped with Betadine and draped in the usual  sterile manner.  The area lateral to the bend of the clavicle was  infiltrated with 1% lidocaine.  A small incision was made with the 15  blade knife just lateral to the bend of the clavicle.  A large bore  finder needle from the Port-A-Cath kit was used to slide gently up  beneath the bend of the clavicle aiming towards the sternal notch, and  the left subclavian vein was accessed without difficulty.  The wire was  placed through the vein without difficulty, and the wire was confirmed  in the central venous system using intraoperative fluoroscopy.  The  incision was then extended medially and laterally, and a subcutaneous  pocket was created by combination of blunt finger dissection and sharp  dissection with the electrocautery.  The well of the port with the  tubing connected to it was then placed in the subcutaneous pocket, and  the length of the tubing was estimated also using fluoroscopy.  The  catheter was cut at about 22 cm.  Next with the patient in Trendelenburg  the sheath and dilator were placed over the wire using the Seldinger  technique  without difficulty.  The dilator and wire were then removed  from the patient, and the thumb was placed over the opening of the  sheath.  The tubing from the catheter was then fed down the sheath as  far as it could be fed, and the sheath was cracked and then gently  separated until it was completely removed from the patient, keeping the  tubing in place.  Once this was accomplished, the tip of the catheter  was checked using intraoperative fluoroscopy, and the reading was that  the tip of the catheter was in the superior vena cava.  The tubing was  then anchored to the well of the port.  The port was then anchored in  the subcutaneous pocket using two 2-0 Prolene stitches.  The well and  catheter were then aspirated and flushed with dilute heparin solution  and then with a concentrated heparin solution.  There was good backflow  of blood, and the catheter flushed easily.  The subcutaneous tissue was  then closed over the catheter  with interrupted 3-0 Vicryl  stitches, and the skin was closed a running 4-0 Monocryl subcuticular  stitch.  Benzoin, Steri-Strips and sterile dressings were applied.  The  patient tolerated the procedure well.  All needle, sponge, and  instrument counts were correct.  The patient was then awakened and taken  recovery in stable condition.      Sammuel Hines. Daiva Nakayama, M.D.  Electronically Signed     PST/MEDQ  D:  11/27/2006  T:  11/27/2006  Job:  [id number]

## 2011-04-20 NOTE — Discharge Summary (Signed)
[name], [name]                            ACCOUNT NO.:  [id number]   MEDICAL RECORD NO.:  [id number]                   PATIENT TYPE:  INP   LOCATION:  A326                                 FACILITY:  [facility]   PHYSICIAN:  Leonides Grills, M.D.            DATE OF BIRTH:  Jan 05, 1933   DATE OF ADMISSION:  08/17/2003  DATE OF DISCHARGE:  08/21/2003                                 DISCHARGE SUMMARY   DISCHARGE DIAGNOSES:  1. Hyperglycemia.  2. Protracted vomiting.  3. Periodontal infection.   HOSPITAL COURSE:  This 77 year old white female was admitted from the office  with a several day history of protracted vomiting and elevated blood sugar.  She had been seen in the emergency room the day before and received a shot  of insulin and antiemetics without success.  In the office her sugar was  approximately 450.  She was unable to keep her medications down, so she was  admitted for control of her vomiting and sugars.  Abdominal film on  admission showed a possible ileus.  Chest x-ray showed bronchitic changes.  The patient's laboratory data showed a white count of 10,000, hemoglobin of  11.4.  Electrolytes were normal.  BUN was 29, creatinine 1.7.  LFTs were  normal, as well as amylase and lipase.  Her glycosylated hemoglobin A1C was  8.3, and her urinalysis was negative.  Stool cultures were obtained, they  were negative at time of discharge.  The patient received IV fluids,  antiemetics, some sliding scale and Lantus insulin.  She was tolerating  liquid diet by the second hospital day, and continued to be advanced  throughout her stay.  Her vomiting had resolved.  She did have some  diarrhea, but this resolved 24 hours before her discharge.  The patient also  received some diabetic teaching and diabetic diet.  Her blood sugars at the  time of discharge were in the 200 range.  It was also noted that she had  only been receiving 5 mg of Glucotrol during her stay instead of the 10  that  was ordered.  She remained afebrile throughout her stay.  She will be  discharged home on her previous medications with the exception of Glucophage  will be increased to 500 three times a day, and continue her Actos 30 a day,  and Glucotrol 10 three times a day.  She is stable at the time of discharge.  We will continue her penicillin for her dental problem, and to follow up in  my office next week to monitor her blood sugars.      ___________________________________________                                            Leonides Grills, M.D.   WMM/MEDQ  D:  08/21/2003  T:  08/22/2003  Job:  [id number]

## 2011-04-20 NOTE — H&P (Signed)
[name], [name]                  ACCOUNT NO.:  [id number]   MEDICAL RECORD NO.:  [id number]          PATIENT TYPE:  INP   LOCATION:  A223                          FACILITY:  [facility]   PHYSICIAN:  Bonne Dolores, M.D.    DATE OF BIRTH:  [birth year]   DATE OF ADMISSION:  12/06/2006  DATE OF DISCHARGE:  LH                              HISTORY & PHYSICAL   CHIEF COMPLAINT:  Chest pain.   HISTORY OF PRESENT ILLNESS:  This is a 77 year old female with a history  of diabetes type 2, hypertension, renal insufficiency, neuropathy,  chronic pain syndrome secondary to degenerative disk disease of the  lower spine status post procedure approximately 2 years ago.  Most  recently the patient has been diagnosed with a breast carcinoma  (inflammatory).  Apparently a sentinel node biopsy was positive and she  has undergone Port-A-Cath placement and received chemotherapy (question  regimen) on December 21 per the patient.   The patient has done quite well tolerated and chemotherapy without  difficulty.  She experienced the fairly abrupt onset of substernal chest  pain with radiation to her shoulders.  She had some dyspnea but no  diaphoresis, syncope, palpitations or nausea.  She does complain of  intermittent diarrhea since having received chemotherapy but denies  melena, hematemesis, hematochezia.  She also has no genitourinary  symptoms.   In the emergency department her initial workup was relatively benign.  She did have a low-grade temperature (100.6).  Lab was significant for  mild pancytopenia (white count 2600 with 15 segs, 75 lymphs), hemoglobin  10.3 and platelet count of 107,000.  The chemistry profile was  remarkable for creatinine a 1.47 (stable) and glucose of 130.  Cardiac  markers and EKG are unremarkable.  A chest x-ray was obtained, which  revealed cardiomegaly.  No other abnormalities noted.   Since admission the patient's chest pain has resolved and the second set  of cardiac  markers is pending.  Follow-up EKG remains normal.   There has been no history of cough, persistent dyspnea, peripheral  edema, true rigor, headache or neurologic deficits.   CURRENT MEDICATIONS:  1. Diazepam 10 mg q.i.d. p.r.n.  2. Paxil 30 mg daily.  3. Lorcet 10/650 mg one q.4h. p.r.n.  4. Glipizide 10 mg daily.  5. Lomotil p.r.n.  6. Metronidazole 250 mg daily.  7. Lantus insulin 16 units at bedtime.   ALLERGIES:  KEFLEX.   PAST HISTORY:  As noted.  She also is being treated for chronic  depression and apparently has a history of anemia prior to a diagnosis  of breast cancer.   FAMILY HISTORY:  Significant for coronary disease in her father and  brother.  A sister died of an abdominal aortic aneurysm.   REVIEW OF SYSTEMS:  Negative except as mentioned.   SOCIAL HISTORY:  Nonsmoker, nondrinker.  She has supportive family.   PHYSICAL EXAMINATION:  GENERAL:  A very pleasant, moderately obese  female, who is conversant, in no distress.  CURRENT VITAL SIGNS:  She is afebrile.  Blood pressure 150/88 with heart  rate of  80 and occasional ectopics only, O2 saturation 97%.  HEENT:  Normocephalic, atraumatic.  Pupils are equal.  Ears, nose and  throat are benign.  She is well hydrated.  NECK:  Supple without masses, thyromegaly, lymphadenopathy or bruits.  LUNGS:  Clear.  CARDIAC:  Heart sounds are normal without murmurs, rubs or gallops.  BREASTS:  Her breast exam reveals some moderate inflammation in the  lateral aspect of the right breast.  There are discrete nodules present,  which are mildly tender.  ABDOMEN:  Nontender, nondistended.  Bowel sounds are intact.  EXTREMITIES:  No clubbing, cyanosis or edema.  NEUROLOGIC:  Nonfocal.   ASSESSMENT:  Chest pain which is some worrisome for anginal syndrome,  though initial workup is benign.  She also has recently undergone  chemotherapy and has mild pancytopenia.  Other chronic problems are  stable.   PLAN:  Rule out MI  protocol.  Will empirically add a beta blocker and  aspirin her regimen as well as PPI.  Will follow and treat expectantly.  Will probably need a Cardiolite evaluation pending her progress.  Of  note, an echocardiogram was obtained prior to chemotherapy performed by  Dr. Harrington Challenger.  This revealed mild mitral valve calcification and mild  insufficiency.  She also has thickening of the aortic valve but no  stenosis.  Ejection fraction approximately is 65%.      Bonne Dolores, M.D.  Electronically Signed     MC/MEDQ  D:  12/07/2006  T:  12/07/2006  Job:  [id number]

## 2011-04-20 NOTE — Consult Note (Signed)
NAMELAVAYA, [name]                  ACCOUNT NO.:  [id number]   MEDICAL RECORD NO.:  [id number]          PATIENT TYPE:  INP   LOCATION:  A223                          FACILITY:  [facility]   PHYSICIAN:  Cristopher Estimable. Lattie Haw, MD, FACCDATE OF BIRTH:  [birth year]   DATE OF CONSULTATION:  12/09/2006  DATE OF DISCHARGE:                                 CONSULTATION   HISTORY OF PRESENT ILLNESS:  A 77 year old woman with multiple  cardiovascular risk factors admitted to the hospital with chest  discomfort.  [name] has no prior cardiac history nor has she  previously been evaluated by a cardiologist.  She did have an attempt at  stress test many years ago, but was unable to walk on the treadmill due  to orthopedic problems, obesity and dyspnea.  She presented to the  emergency department with a history of progressive chest discomfort over  the course of the preceding hour or so.  She noticed some vague lower  substernal discomfort at the onset that progressed, becoming moderately  severe and associated with dyspnea.  There was radiation retrosternally  across both shoulders, around the flanks and to the lower back.  There  was no relationship to exertion - the onset was at rest.  There was no  relationship to movement of the trunk.  There was no chest wall  tenderness.  She summoned EMS.  Symptoms resolved spontaneously over the  next 15-30 minutes.  She was not given nitroglycerin, nor any  analgesics.   CARDIOVASCULAR RISK FACTORS:  Diabetes requiring insulin therapy,  hypertension, renal insufficiency.  Her lipid status is unknown to her.  She smoked cigarettes in the past, but not in recent years.   PAST MEDICAL HISTORY:  1. A major recent medical problem involves the diagnosis of carcinoma      of the right breast.  She has inflammatory pathology prompting      chemotherapy preceding surgery.  She was administered her first      course of chemotherapy approximately 3 weeks ago with good   tolerance.  2. The patient also reports episodic diarrhea that responds to Flagyl      and Lomotil.  3. She has a history of GERD, but has not been maintained on acid      suppressant therapy.  4. She also has had diverticulosis.  5. She underwent endoscopy and colonoscopy a few years ago for a GI      bleed attributed to gastric antritis.  6. She has had considerable orthopedic problems with chronic left knee      pain and chronic lower back problems.  She underwent 2 complex      surgeries - one by Dr. Saintclair Halsted in 2005, and a second by Dr. Joya Salm in      2006.  She continues to have difficulty walking and back pain.  7. History of fibromyalgia.  8. She has had depression and anxiety.  9. She underwent a partial hysterectomy in 1979.  10.Cholecystectomy in 1989 with incidental appendectomy.  11.Recently, a subcutaneous infusion device was placed surgically.  12.She  has also undergone bilateral cataract extraction.   SOCIAL HISTORY:  Husband is deceased; she has a son who lives locally  and is supportive.  There is no history of excessive alcohol use.   ALLERGIES:  CEPHALOSPORINS.   RECENT MEDICATIONS:  1. Diazepam 10 mg q.i.d. p.r.n.  2. Paxil 30 mg daily.  3. Lorcet q.4h. p.r.n.  4. Glipizide 10 mg daily.  5. Lomotil p.r.n.  6. Metronidazole p.r.n.  7. Lantus insulin 16 units q.p.m.   FAMILY HISTORY:  Notable for coronary disease in her father and brother.  Sister suffered a fatal abdominal aortic aneurysm.  Her husband also had  a ruptured abdominal aortic aneurysm that required prolonged ICU care.   REVIEW OF SYSTEMS:  Notable for flu and pneumococcal vaccines being up  to date, intermittent headaches, the need for corrective lenses for near  vision, virtually constant mild to moderate low back pain.  All other  systems reviewed and are negative.   PHYSICAL EXAMINATION:  GENERAL:  A pleasant overweight woman in no acute  distress.  VITAL SIGNS:  The temperature is 96.9,  blood pressure 130/60, heart rate  65 and regular, respirations 16, O2 saturation 95% on 2 liters.  CBGs  have been 120-200.  Weight is 244 pounds.  HEENT:  Anicteric sclerae; normal lids and conjunctivae; normal oral  mucosa; discoloration over the right tongue that appears to be trauma,  but the patient denies any discomfort in this region.  NECK:  No jugular venous distension; normal carotid upstrokes with  bilateral bruits most likely - cannot exclude transmitted murmur.  LUNGS:  Entirely clear.  CARDIAC:  Normal first and second heart sounds; fourth heart sound  present; grade 1-2/6 basilar and apical systolic ejection murmur.  Normal PMI.  ABDOMEN:  Soft and nontender; normal bowel sounds; aortic pulsation not  palpable; no organomegaly; no masses.  EXTREMITIES:  No edema; distal pulses intact.  NEUROMUSCULAR:  Symmetric strength and tone; normal cranial nerves.  MUSCULOSKELETAL:  Surgical incisions over the lumbosacral spine;  decreased spinal mobility.  PSYCHIATRIC:  Alert and oriented; normal affect.  SKIN:  No significant lesions; healing surgical scars over the chest and  right breast.   ELECTROCARDIOGRAM:  Normal sinus rhythm; minimal nonspecific T-wave  abnormality.   LABORATORY DATA:  Otherwise notable for negative cardiac markers, a  creatinine of 1.5, pancytopenia with white blood count of 2600,  hemoglobin of 10.3 and platelets of 107.  D-dimer is 2.15.  Electrolytes  are normal.   IMPRESSION:  [name] presents with high cardiovascular risk, apparent  cerebrovascular disease and myocardial infarction ruled out.  In the  absence of recurrent symptoms, EKG abnormalities and cardiac marker  abnormalities, further cardiac workup can be performed on an outpatient  basis.  We will schedule a stress test later this week.  Carotid  ultrasound studies have been requested.  Due to the presence of diabetes and evidence for vascular disease,  she requires statin therapy,  which has been initiated.  A baseline lipid  profile is pending.  I appreciate the opportunity to evaluate this nice  woman and will be happy to follow her with you both in hospital and  subsequent to discharge.      Cristopher Estimable. Lattie Haw, MD, [facility]  Electronically Signed     RMR/MEDQ  D:  12/09/2006  T:  12/09/2006  Job:  [id number]

## 2011-04-20 NOTE — Op Note (Signed)
NAME:  [name], [name]                            ACCOUNT NO.:  [id number]   MEDICAL RECORD NO.:  [id number]                   PATIENT TYPE:  AMB   LOCATION:  DAY                                  FACILITY:  [facility]   PHYSICIAN:  Hildred Laser, M.D.                 DATE OF BIRTH:  15-Apr-1933   DATE OF PROCEDURE:  DATE OF DISCHARGE:                                 OPERATIVE REPORT   PROCEDURE:  Esophagogastroduodenoscopy followed by total colonoscopy.   ENDOSCOPIST:  Hildred Laser, M.D.   INDICATIONS:  This patient is a 77 year old Caucasian female with multiple  medical problems who was recently found to have mild anemia and heme  positive stools. She has chronic GERD and intermittent dysphagia. Her iron  studies reveal a low saturation of 16%.  B12 and folate levels are normal.  She is undergoing diagnostic evaluation.  The procedure and risks were  reviewed with the patient and informed consent was obtained.   PREOPERATIVE MEDICATIONS:  Cetacaine spray for oropharyngeal topical  anesthesia, Demerol 100 mg IV and Versed 20 mg IV in divided dose,  __________ 1.25 mg IV.   FINDINGS:  Procedure performed in endoscopy suite.  The patient's vital  signs and O2 saturation were monitored during the procedure and remained  stable.  The patient was very anxious and required high dose conscious  sedation.   PROCEDURE #1: ESOPHAGOGASTRODUODENOSCOPY:  The patient was placed in the  left lateral recumbent position and Olympus videoscope was passed via the  oropharynx without any difficulty into the esophagus.   ESOPHAGUS:  Mucosa of the esophagus was normal throughout.  She had a ring  at GE junction and a small sliding hiatal hernia.   STOMACH:  It was empty and distended very well with insufflation.  The folds  of the proximal stomach were normal.  Examination of the mucosa revealed  __________ granularity, but no erosions or ulcers were noted.  The pyloric  channel was patent.  The  angularis, fundus, and cardia were examined by  retroflexing the scope and were normal.   DUODENUM:  Examination of the bulb revealed normal mucosa. Folds and mucosa  in the second and third part of the duodenum were also normal. Endoscope was  withdrawn. The esophagus was dilated by passing the 56 French Maloney  dilator to full insertion.  Post ED examination of the esophagus reveals a  small linear tear of the cervical esophagus and the ring was also disrupted.   Endoscope was withdrawn and the patient was prepared for procedure #2.   TOTAL COLONOSCOPY:  Rectal examination performed.  No abnormality noted on  external or digital exam.   The Olympus videoscope was placed in the rectum and advanced under vision  into the sigmoid colon and beyond.  Preparation was satisfactory, however,  she has so many air bubbles, possibly related to her prep i.e. some type of  broth.  Scattered diverticula were noted.  Slowly and carefully the scope  was advanced into the cecum which was identified by ileocecal valve and  appendiceal stump.  Pictures were taken for the record.  As the scope was  withdrawn the colonic mucosa was carefully examined and there were no polyps  and/or tumor masses. The rectal mucosa similarly was normal.   The scope could not be retroflexed in the rectum, but the anorectal junction  was well seen and hemorrhoids were noted in the anal canal.  The endoscope  was withdrawn.  The patient tolerated the procedures well.   FINAL DIAGNOSES:  1. Small sliding hiatal hernia with ring at gastroesophageal junction.  2. Nonerosive antral gastritis.  3. Esophagus dilated by passing 20 French Maloney dilator resulting in small     mucosal tear at cervical esophagus as well as at the gastroesophageal     junction.  4. A few diverticula at sigmoid colon.  5. Small external hemorrhoids, otherwise normal colonoscopy.   RECOMMENDATIONS:  1. She will continue antireflux measures and  Protonix as before.  2. H&H will be repeated today along with H. pylori serology and ferritin.  3. High fiber diet.  4. I will be contacting the patient with results of these studies and     further recommendations.      ___________________________________________                                            Hildred Laser, M.D.   NR/MEDQ  D:  12/17/2003  T:  12/17/2003  Job:  [id number]   cc:   Sherrilee Gilles. Gerarda Fraction, M.D.  [address] [place] [id number]  Fax: [phone]

## 2011-04-20 NOTE — Discharge Summary (Signed)
[name], [name]                  ACCOUNT NO.:  [id number]   MEDICAL RECORD NO.:  [id number]          PATIENT TYPE:  INP   LOCATION:  T016                          FACILITY:  [facility]   PHYSICIAN:  Bonne Dolores, M.D.    DATE OF BIRTH:  1933-05-10   DATE OF ADMISSION:  12/06/2006  DATE OF DISCHARGE:  01/08/2008LH                               DISCHARGE SUMMARY   DISCHARGE DIAGNOSES:  1. Chest pain, rule out myocardial infarction protocol, negative.  No      recurrence and stable.  2. Peripheral vascular disease:  Carotid Doppler demonstrates 60%      stenosis of the left carotid.  The right carotid is clear.  3. Elevated D-dimer (2.15), V/Q scan benign.  4. Diabetes type 2.  5. Hypertension, controlled.  6. Renal insufficiency.  7. History of neuropathy.  8. Chronic pain syndrome, secondary to degenerative disk disease of      the lower lumbar spine, status post procedure approximately two      years ago.  9. Recent diagnosis of breast cancer (inflammatory).  Sentinel node      biopsy positive, status post chemotherapy (triple therapy).   HISTORY OF PRESENT ILLNESS:  For details regarding the admission, please  refer to the admission history.  Briefly, this 77 year old female with  the above history, presented to the emergency department with the abrupt  onset of a substernal chest pain with radiation to her shoulders.  She  had a brief episode of dyspnea but no diaphoresis, syncope, palpitations  or nausea.  Her initial workup was benign and she was admitted for  further evaluation and therapy.   HOSPITAL COURSE:  The patient underwent a rule out myocardial infarction  protocol, which was benign.  She was seen by cardiology on the third  hospital day, and a Doppler of her neck was ordered.  A Cardiolite  scheduled for an outpatient procedure.  No changes in her medications.   The patient has been stable.  She had a mild pan-cytopenia upon  admission, and this resolved.  Her  hemoglobin is currently stable at 10.   As noted, the patient has had no recurrent chest pain or other problems.  She is eating well and ambulating without difficulty.   DISPOSITION:  She will be discharged for further management as an  outpatient.   DISCHARGE MEDICATIONS:  1. Diazepam 10 mg q.i.d. p.r.n.  2. Paxil 30 mg q.d.  3. Lorcet 10/650 mg, one q.4h. p.r.n.  4. Glipizide 10 mg q.d.  5. Lomotil p.r.n.  6. Metronidazole 250 mg q.d.  7. Lantus insulin 16 units at bedtime.  8. Toprol XL 50 mg q.d.  9. Aspirin 325 mg q.d.   FOLLOWUP:  She will be followed as an outpatient.      Bonne Dolores, M.D.  Electronically Signed     MC/MEDQ  D:  12/10/2006  T:  12/10/2006  Job:  [id number]

## 2011-04-20 NOTE — Discharge Summary (Signed)
[name], [name]                            ACCOUNT NO.:  [id number]   MEDICAL RECORD NO.:  [id number]                   PATIENT TYPE:  INP   LOCATION:  3023                                 FACILITY:  [facility]   PHYSICIAN:  Kary Kos, M.D.                     DATE OF BIRTH:  February 20, 1933   DATE OF ADMISSION:  05/31/2004  DATE OF DISCHARGE:  06/08/2004                                 DISCHARGE SUMMARY   ADMITTING DIAGNOSIS:  Severe degenerative disk disease, L3-4, with grade 1  spondylolisthesis, degenerative in nature, L3-4.   PROCEDURES DURING THIS HOSPITALIZATION:  Posterior lumbar interbody fusion  procedure and decompressive laminectomy at L3-4.   HOSPITAL COURSE:  The patient is a very pleasant 77 year old white female  who was admitted in the a.m. and went to the operating room and underwent  the aforementioned procedures.  Postop, the patient did very well and was  recovered on the floor.  On the floor, postop day 1, was afebrile with  stable vital signs, neurologically intact, was noted to be somnolent and  felt to be over-medicated; some of her medicines were withheld and it was  felt to be a combination of general anesthesia, oxycodone and Valium that  had been on board.  Some of the medicines were held back and she  progressively became more and more awake.  Over the next couple of days, she  mobilized with physical therapy and occupational therapy, however, this was  a slow process.  She continued to work with physical therapy, however, due  to pain control issues, was making slow progress.  The patient was referred  to [facility] and rehab for admission, however, the patient denied this and  ultimately was stable to be discharged home to the care of her son and was  discharged with home physical therapy.  Postop films showed excellent  alignment and fusion and discharged on p.o. oxycodone for pain.                                                Kary Kos, M.D.    GC/MEDQ   D:  07/28/2004  T:  07/28/2004  Job:  [id number]

## 2011-04-20 NOTE — H&P (Signed)
[name], [name]                              ACCOUNT NO.:  [id number]   MEDICAL RECORD NO.:  [id number]                   PATIENT TYPE:   LOCATION:                                       FACILITY:   PHYSICIAN:  Leonides Grills, M.D.            DATE OF BIRTH:   DATE OF ADMISSION:  DATE OF DISCHARGE:                                HISTORY & PHYSICAL   CHIEF COMPLAINT:  Vomiting and high blood sugar.   PRESENTING ILLNESS:  This is a 77 year old white female whose problems began  approximately 10 days ago when she had some teeth extracted and had  complications involving severe pain, nausea, vomiting, and infection.  The  patient's nausea and vomiting has persisted off and off, especially the last  several days it has been persistent.  She is unable to tolerate her p.o.  medications.  She was given IV fluids, insulin, and antiemetics IV in the  emergency room the day before this admission.  She was still vomiting and  unable to tolerate her pills.  Blood sugar in the office at this time is 458  and her blood pressure is 152/100.  Due to multiple medical problems and the  protracted nature of this situation, she is admitted for IV fluids, control  of her vomiting, blood sugars, and blood pressure.   PAST MEDICAL HISTORY:   ALLERGIES:  She is allergic to no medications.   CURRENT MEDICATIONS:  1. Glucophage 500 mg b.i.d.  2. Glucotrol 5 mg b.i.d.  3. Actos 30 mg daily.  4. Tequin 400 mg daily.   She also takes p.r.n. medications including Ativan and Atarax, Valium,  Voltaren, and Tylox.   REVIEW OF SYSTEMS:  She denies chest pain or shortness of breath.  She does  say her stools have been somewhat loose.   PHYSICAL EXAMINATION:  GENERAL:  Elderly white female who appears miserable  and slightly dehydrated.  VITAL SIGNS:  Temperature is 99.8, pulse is 88 and regular, blood pressure  152/100, respirations are 20 and unlabored.  HEENT:  TMs normal.  Pupils equal to light  and accomodation.  Oropharynx  benign although there is some inflammation in several of her tooth sockets.  NECK:  Supple without JVD, bruit, or thyromegaly.  LUNGS:  Clear in all areas.  HEART:  Regular sinus rhythm without murmur, gallop, or rub.  ABDOMEN:  Mild diffuse tenderness, diminished bowel sounds although  positive.  EXTREMITIES:  Without clubbing, cyanosis, or edema.  NEUROLOGIC:  Grossly intact.    ASSESSMENT:  1. Protracted vomiting.  2. Hyperglycemia.  3. Type 2 diabetes.  4. Hypertension.  5. History of fibromyalgia.  6. History of hyperlipidemia.  Leonides Grills, M.D.    WMM/MEDQ  D:  08/17/2003  T:  08/17/2003  Job:  [id number]

## 2011-04-20 NOTE — Op Note (Signed)
[name], [name]                  ACCOUNT NO.:  [id number]   MEDICAL RECORD NO.:  [id number]          PATIENT TYPE:  INP   LOCATION:  2860                         FACILITY:  [facility]   PHYSICIAN:  Leeroy Cha, M.D.   DATE OF BIRTH:  [birth year]   DATE OF PROCEDURE:  08/15/2005  DATE OF DISCHARGE:                                 OPERATIVE REPORT   ADMISSION DIAGNOSES:  1.  L4-5 spondylolisthesis with kyphosis.  2.  L4-L5 radiculopathy, status post anterior fusion.   POSTOPERATIVE DIAGNOSES:  1.  L4-5 spondylolisthesis with kyphosis.  2.  L4-L5 radiculopathy, status post anterior fusion.   PROCEDURE:  Removal of the previous rod, bilateral L4 laminectomy and  facetectomy, bilateral L4-5 discectomy with insertion of cage 12 x 22 with  autograft and BMP.  Peripheral screws L5 augmentation.  Posterolateral  arthrodesis with autograft, C-arm.   SURGEON:  Leeroy Cha, M.D.   INDICATIONS FOR PROCEDURE:  The patient underwent surgery at the level of L3-  L4 a year ago by a different surgeon.  The patient had been complaining of  back pain with radiation down to both legs which is getting worse the past  two to three months.  __________  down onto the tissue on the level of 3-4  is in good condition.  Nevertheless, she had developed spondylolisthesis at  the level of 4-5 and __________  she developed kyphosis.  That is producing  narrowing of the canal, giving her radiculopathy.  Surgery was advised.  The  risks were explained in the history and physical.   DESCRIPTION OF PROCEDURE:  Patient was taken to the ER and positioned in  prone manner.  Midline incision from L3 down to L5-S1 was made.  Muscle was  retracted laterally.  We went laterally until we were able to find the  screws at L3-L4.  From then on, we identified the spinal process of L4 and  the lamina and thus were removed as well as the facets.  We entered the disc  space and total gross discectomy was achieved.  Then we  replaced the disc  using two cages 12 x 22 with autograft and BMP inside.  The lateral gutters  were filled up with the same BMP and autograft.  From then, with the help of  the C-arm, we introduced a pedicle probe.  The bone was really quite brittle  and we had to __________  laterally to prevent going medially.  Tap was used  and then two screws of 5.5 x 45 were introduced.  Then a new rod of 60 and 7  mm length was inserted from the pedicle screws of L3, L4 and L5 and this was  secured in place with cap.  Crossling was used to secure the area in the  __________  position.  From there on,  we went laterally.  We drilled the lateral aspect of the facet of L4-L5 as  well as transverse process.  A mix of BMP and autograft was used for the  arthrodesis.  From then on, the area was irrigated.  Fentanyl  was left in  the dural space.  The wound was closed with Vicryl and Steri-Strips.           ______________________________  Leeroy Cha, M.D.     EB/MEDQ  D:  08/15/2005  T:  08/15/2005  Job:  [id number]   cc:   Sherrilee Gilles. Gerarda Fraction, MD  [address] [place] [id number]  Fax: [phone]

## 2011-04-20 NOTE — Discharge Summary (Signed)
NAMEJIA, DOTTAVIO                  ACCOUNT NO.:  [id number]   MEDICAL RECORD NO.:  [id number]          PATIENT TYPE:  INP   LOCATION:  A218                          FACILITY:  [facility]   PHYSICIAN:  Bonne Dolores, M.D.    DATE OF BIRTH:  [birth year]   DATE OF ADMISSION:  04/03/2007  DATE OF DISCHARGE:  05/04/2008LH                               DISCHARGE SUMMARY   DISCHARGE DIAGNOSES:  1. Weakness, diarrhea and pancytopenia secondary to chemotherapy.  2. Inflammatory breast carcinoma with therapy and progress      orchestrated by Dr. Tressie Stalker.  Bilateral mastectomies pending.  3. Mild diabetes mellitus, currently on Lantus, low dose.  4. Peripheral vascular disease.  5. Hypertension.  6. Renal insufficiency, stable.  7. History of neuropathy, stable.  8. Chronic pain syndrome secondary to disk disease of the lower spine,      status post procedure approximately 2 years ago.   For details regarding admission, please refer to the admitting note.  Briefly, this 76 year old female with the above problems, was diagnosed  with breast cancer December 2007.  She had a positive sentinel node  biopsy and is scheduled for mastectomies.  She has been undergoing  chemotherapy pre-operatively.  The surgery has been postponed due to an  illness in the surgeon who is in charge of her care.   The patient was administered chemo by Dr. Tressie Stalker, approximately 5-6  days prior to admission.  She developed severe weakness and diarrhea and  presented to the emergency department for evaluation.  She was found to  have a white count of 1,300 with 12 neutrophils, 48 monos, a hemoglobin  9.2, a hematocrit 26, platelet count 200,000.  She did have a glucose of  341, with a compensatory hyponatremia.  Renal functions:  BUN 29,  creatinine 1.9.  CT scan of the brain was obtained which was  unrevealing.  She also had fallen in the tub and sustained an injury to  her right shoulder, and the x-ray was benign.   The patient was admitted with apparent dehydration and diarrhea  associated with neutropenia and anemia, secondary to chemotherapy.   COURSE IN THE HOSPITAL:  The patient was hydrated and treated  empirically with Flagyl.  Stool cultures were non-revealing.  C. diff  was negative.  Dr. Shonna Chock saw the patient in consultation, and no changes  were made her regimen.  She was anemic with a hemoglobin dropping to 7.5  on the second hospital day.  She received 2 units of packed cells.  Her  current hemoglobin was 4.3.  Her white count has come up to 4,000, with  58% segs.  Platelet count stable at 213,000.  Chemistries are also  stable with normalization renal function.  Her glucose had been  mildly  elevated, currently 211.   Her diarrhea has resolved.  She is regain her strength and strong and  requesting discharge.  I feel this is appropriate.  She does have some  help at home.  She is anxious to continue chemotherapy and with Dr.  Tressie Stalker.  This as possible.  DISPOSITION:  Medications include:  1. Lantus insulin 60 units q.h.s.  2. Metronidazole total 50 mg p.o. daily.  3. Lomotil p.r.n.  4. Glipizide 10 mg p.o. q.a.m.  5. Lorcet 10/650 q.4 p.r.n. pain.  6. Paxil 30 mg daily.  7. Diazepam 10 mg p.o. q.i.d. p.r.n.   She will be followed __________ as an outpatient.      Bonne Dolores, M.D.  Electronically Signed     MC/MEDQ  D:  04/06/2007  T:  04/06/2007  Job:  [id number]

## 2011-04-20 NOTE — Consult Note (Signed)
[name], [name]                            ACCOUNT NO.:  [id number]   MEDICAL RECORD NO.:  [id number]                  PATIENT TYPE:   LOCATION:                                       FACILITY:  [facility]   PHYSICIAN:  Hildred Laser, M.D.                 DATE OF BIRTH:  March 17, 1933   DATE OF CONSULTATION:  12/07/2003  DATE OF DISCHARGE:                                   CONSULTATION   REFERRING PHYSICIAN:  Sherrilee Gilles. Fusco, M.D.   CHIEF COMPLAINT:  Heme-positive stools, one out of three, and iron-  deficiency anemia.   HISTORY OF PRESENT ILLNESS:  [name] is a 77 year old Caucasian  female who presents to our office for evaluation of the above.  She was  noted to have a decreased hemoglobin of 11.4 and a hematocrit of 36.6 on  September 03, 2003, MCV of 91.  This was followed by anemia profile that showed  iron of 63, TIBC 384, TIBC percent saturation 16, vitamin B12 364, and  folate of 12.6.  [name] states she did have what she felt to be a  colonoscopy approximately between 2000 and 2002, although I do not have  these records for my review.  She denies any history of peptic ulcer  disease.  She does report history of heartburn and indigestion as well as  some regurgitation.  She reportedly had the EGD by Dr. Luan Pulling in 1985.  She  reports occasional dysphagia and denies any odynophagia.  She does have  history of fecal incontinence.  She denies any abdominal pain, except  generalized abdominal pain prior to a bowel movement, which is relieved with  defecation.  Bowel movement has intermittently changed between hard stools  and loose.  She does report some episodes of nausea and vomiting with  hyperglycemia in September 2004.  She also history of depression and was  started on Lexapro; however, she discontinued this.  She has since started  Prozac 20 mg yesterday.  She states that she is overwhelmed with depression  since the death of her husband in 07/09/03.  She denies any  suicidal or  homicidal ideation.   PAST MEDICAL HISTORY:  1. Diverticulosis.  2. Diabetes mellitus.  3. Hypertension.  4. Chronic left knee and back pain for which she sees both Dr. Aline Brochure and     neurologist Dr. Saintclair Halsted.  5. Depression and anxiety.   PAST SURGICAL HISTORY:  Partial hysterectomy in 1979, cholecystectomy  secondary to cholelithiasis in 1989, appendectomy that was performed in 1989  at the time of cholecystectomy, and D&C.   CURRENT MEDICATIONS:  1. Morphine 30 mg daily.  2. Tylox p.r.n.  3. Vicodin p.r.n.  4. Prinivil 20 mg daily.  5. Phenergan 25 mg p.r.n.  6. Glucophage 500 mg daily.  7. Glucotrol 10 mg daily.  8. Lomotil p.r.n.  9. Prozac 20 mg daily.  10.      Valium 10 mg p.r.n.  11.      Ativan 0.25 mg p.r.n.  12.      Atarax 25 mg p.r.n.   ALLERGIES:  No known drug allergies.   FAMILY HISTORY:  Both mother and father are deceased.  There is family  history of CAD.  She denies any colorectal carcinoma family history.  One  sister is deceased secondary to aneurysm.  Two brothers deceased with CAD.   SOCIAL HISTORY:  [name] is currently a widow as her husband died in July 02, 2003.  She now lives with her grandson and his wife.  She has two healthy  sons and one daughter deceased secondary to bronchial pneumonia.  She has  been retired for 10 years; however, she was on disability prior to  retirement secondary to her multiple medical complaints.  She currently does  not smoke.  She does report a 30+ year of 1-2 packs per day quitting  approximately 10 years ago.  She denies any alcohol or drug use.   REVIEW OF SYSTEMS:  CONSTITUTIONAL:  Weight is stable.  Appetite is  decreased.  No fever or chills.  CARDIOVASCULAR:  She denies any chest pains  or palpitations.  PULMONARY:  She denies any shortness of breath, dyspnea or  hemoptysis.  GU:  Some stress incontinence.  She denies any dysuria,  hematuria, or increased urinary frequency.  GI:  See HPI.   SKIN:  Denies any  rash or jaundice.   PHYSICAL EXAMINATION:  VITAL SIGNS:  Weight 241 pounds, height 71 inches.  Temperature 97.2, blood pressure 142/60, pulse 68.  GENERAL:  [name] is an obese 77 year old Caucasian female who is  extremely tearful and crying relatively uncontrollably today.  HEENT:  Sclerae clear, nonicteric, conjunctivae pink.  Oropharynx pink and  moist without any lesions.  NECK:  Supple without any mass or thyromegaly.  CHEST:  Regular rate and rhythm without murmurs, clicks, rubs, or gallops.  LUNGS:  Clear to auscultation bilaterally.  ABDOMEN:  Obese with positive bowel sounds x4, soft, nontender, nondistended  with no palpable mass or hepatosplenomegaly.  EXTREMITIES:  2+ pedal pulses bilaterally.  No edema.  SKIN:  Pink, warm, and dry without any rash or jaundice.   LABORATORY DATA:  As discussed in HPI.  Also, SED rate 27, total bilirubin  0.3, direct bilirubin less than 0.1, alkaline phosphatase 45, SGOT 11, SGPT  11, total protein 7.3, albumin 4.3.  T4 6.7,  TSH 1.12, T3 74.1.  Sodium  140, potassium 5.2, chloride 102, CO2 26, glucose 109, BUN 19, creatinine  1.4, calcium 9.4.   ASSESSMENT:  1. [name] is a 77 year old Caucasian female with normocytic anemia     and  hemoccult-positive stools.  Will do some research regarding reported  colonoscopy.  However, at this point, we do not have any evidence that this  has been performed.  She definitely needs evaluation with both EGD to rule  out peptic ulcer disease as well as colonoscopy to rule out colorectal  carcinoma.  1. Long-standing history of gastroesophageal reflux disease.  We will     initiate proton pump inhibitor therapy today.  2. Depression.  I have asked that [name] be scheduled to see Dr. Gerarda Fraction     regarding her depression and possible antidepressant therapy.   RECOMMENDATIONS:  1. I have discussed both EGD and colonoscopy with [name] with risks and    benefits  which include but are not limited to bleeding, perforation, and     infection.  She agrees with this plan.  I have asked her to decrease her     Glucophage to 250 mg and Glucotrol to 5 mg the day prior to and of the     procedure to prevent hypoglycemia.  2. Protonix 40 mg, two weeks of samples were given.  3. Further recommendations pending procedures.   We would like to thank Dr. Gerarda Fraction for allowing [place] to participate in the care  of [name].     ________________________________________  ___________________________________________  Les Pou, N.P.                  Hildred Laser, M.D.   KC/MEDQ  D:  12/07/2003  T:  12/07/2003  Job:  [id number]

## 2011-04-20 NOTE — Procedures (Signed)
[name], [name]                 ACCOUNT NO.:  [id number]   MEDICAL RECORD NO.:  [id number]          PATIENT TYPE:  REC   LOCATION:                                FACILITY:  [facility]   PHYSICIAN:  Wallis Bamberg. Johnsie Cancel, MD, FACCDATE OF BIRTH:  05-Feb-1933   DATE OF PROCEDURE:  DATE OF DISCHARGE:                                ECHOCARDIOGRAM   INDICATIONS:  History of breast cancer.  Check LV function.   FINDINGS:  The cavity size was normal.  There was mild to moderate LVH,  septal thickness was 15 mm.  Ejection fraction of 65%.  There are no  regional wall motion abnormalities.  Mitral valve had posterior annular  calcification with mild MR.  There is mild left atrial enlargement.  Aortic valve was trileaflet with minimal sclerosis.  Peak aortic  velocity of 2 meters per second with a mean gradient 11 and a peak  gradient of 15.   Right-sided cardiac chambers were normal.  There is trivial TR with no  evidence of a pulmonary hypertension.  Subcostal imaging revealed no  atrial septal defect, no source of embolus, no effusion.   FINAL IMPRESSION:  1. Moderate left ventricular hypertrophy, ejection fraction 65%.  2. Mitral annular calcification and mild mitral regurgitation.  3. Aortic valve sclerosis with minimal stenosis.  4. Mild atrial enlargement.  5. Normal right-sided cardiac chambers.  6. No pericardial effusion.   Overall the study was similar to that performed in December 2007.   M-mode measurements included the following:  Aortic dimension 3 cm.  Left atrial dimension 42 mm.  Septal thickness 15 mm.  Peak aortic velocity 2 meters per second.  Peak aortic gradient 15 mmHg.  Mean aortic gradient 11 mmHg.  Left ventricular diastolic dimension 40 mm.  Left ventricular systolic dimension 35 mm.      Wallis Bamberg. Johnsie Cancel, MD, [facility]  Electronically Signed     PCN/MEDQ  D:  02/04/2007  T:  02/04/2007  Job:  [id number]   cc:   Gaston Islam. Tressie Stalker, MD  Fax: [phone]   Cristopher Estimable.  Lattie Haw, [place], [facility] [address]  [place], [place] [address]

## 2011-04-20 NOTE — Consult Note (Signed)
NAMESORA, [name]                  ACCOUNT NO.:  [id number]   MEDICAL RECORD NO.:  [id number]          PATIENT TYPE:  INP   LOCATION:  A218                          FACILITY:  [facility]   PHYSICIAN:  Caro Hight, M.D.      DATE OF BIRTH:  Jul 18, 1933   DATE OF CONSULTATION:  04/03/2007  DATE OF DISCHARGE:                                 CONSULTATION   REASON FOR CONSULTATION:  Diarrhea.   REQUESTING PHYSICIAN:  Bonne Dolores, M.D.   HISTORY OF PRESENT ILLNESS:  [name] is a 77 year old Caucasian female  with right breast carcinoma with positive regional lymph nodes who is  undergoing chemotherapy.  She was readmitted with leukopenia.  She has  difficulty recalling any history.  She states she started having this  problem today.  She does have some intermittent diarrhea, although she  is not real clear how often.  She has difficulty stating how often.  She  says she had a little bit last night but not too bad.  She believes she  gets diarrhea with her chemotherapy.  She says she had several episodes  of diarrhea last night.  She never looks at her stool, so she is not  sure if she has had any blood in it.  She complains of fatigue.  Denies  any fever or chills.  Complains of acid reflux and epigastric pain.  She  states her last chemotherapy was two weeks ago.  She had an EGD and  colonoscopy by Dr. Laural Golden in January 2005, and was found to have a small  sliding hiatal hernia with ring at the GE junction, which was dilated,  nonerosive antral gastritis.  She had a few diverticulum in the sigmoid  colon and a small external hemorrhoid.   HOME MEDICATIONS:  1. Diazepam 10 mg q.i.d. p.r.n.  2. Paxil daily.  3. Lorcet 10/650 mg every four hours as needed.  4. Glipizide 10 mg daily.  5. Lomotil p.r.n.  6. Flagyl p.r.n. (She states she takes one if she gets diarrhea but      not a full course).  7. Lantus 16 units subcutaneous q.h.s.  8. Nexium daily p.r.n.  9. She is also getting  chemotherapy (FEC and Herceptin).   ALLERGIES:  1. KEFLEX CAUSES SORE MOUTH.  2. IMODIUM CAUSES HER MOUTH TO ITCH.   PAST MEDICAL HISTORY:  1. Hypertension.  2. Depression.  3. Diabetes mellitus.  4. Right breast carcinoma with positive regional nodes.  She has      undergone four cycles of FEC, currently awaiting surgery.  5. She has a history of renal insufficiency.  6. Fibromyalgia.  7. Peripheral vascular disease with a 60% stenosis of the left carotid      artery.  8. GERD.  9. Diverticulosis.  10.She has had a hysterectomy.  11.Cholecystectomy.  12.Two back surgeries.  13.Bilateral cataract extractions with lens implants.  14.Port-A-Cath.  15.Appendectomy.   FAMILY HISTORY:  Mother deceased, age 10, Alzheimer disease.  Father  deceased, age 36, MI.  No family history of colorectal cancer.   SOCIAL HISTORY:  She is widowed on disability.  She lives alone.  Quit  smoking in the remote past.  No alcohol use.   REVIEW OF SYSTEMS:  See HPI for GI.  CONSTITUTIONAL:  No significant  weight loss.  CARDIOPULMONARY:  Denies any chest pain or shortness of  breath.   PHYSICAL EXAMINATION:  VITAL SIGNS: Temp 97.8, pulse 83, respirations  16, blood pressure 100/47, weight 100.4 kg, height 70 inches.  GENERAL:  A pleasant, well nourished, somewhat pale-appearing, elderly,  Caucasian female in no acute distress. She is resting comfortably and  arouses easily.  MENTAL STATUS:  She is somewhat slow to respond to questions and often  has difficulty with finding the answers.  She is alert and oriented to  person, place and time.  SKIN:  Warm and dry.  No jaundice.  Pale.  HEENT:  Sclerae are nonicteric.  Oropharyngeal mucosa is somewhat dry.  No erythema or exudate or lesions.  No lymphadenopathy.  CHEST:  Lungs are clear to auscultation.  CARDIAC:  Reveals a regular rate and rhythm.  ABDOMEN:  Obese but symmetrically soft, nontender, nondistended.  Did  not appreciate any  organomegaly or masses.  EXTREMITIES:  No edema.   LABORATORY:  White count 1300, absolute neutrophil count 156, hemoglobin  9.2, hematocrit 26.4, platelets 203,000.  Sodium 129, potassium 4.2, BUN  29, creatinine 1.92, glucose 341.  Urinalysis revealed 100 protein, many  bacteria, 0-2 WBCs.  Chest x-ray revealed no acute findings.  CT of the  head revealed acute left maxillary sinusitis.   IMPRESSION:  The patient is a 77 year old lady with breast cancer who  recently has been undergoing chemotherapy who presents with intermittent  diarrhea and leukopenia.  She relates her diarrhea to her cycles of  chemotherapy.  She also, it sounds like, had this off and on even before  chemotherapy.  She does have diabetes and possibly has diabetic  enteropathy versus a side effect of diarrhea secondary to chemotherapy.  She has significant leukopenia and neutropenia but denies any abdominal  pain, typhlitis less likely.  We do need to consider infectious  etiology.  She also complains of refractory gastroesophageal reflux  disease, although is not on any daily therapy.   RECOMMENDATIONS:  1. Protonix 40 mg daily.  2. Followup stool studies.  3. Agree with Flagyl.  4. Neutropenic precautions.  5. CBC, C-MET in the morning.  6. Supportive measures.  7. Further recommendations to follow.   I would like to thank Dr. Bonne Dolores for allowing [place] to take part in  the care of this patient.      Neil Crouch, P.A.      Caro Hight, M.D.  Electronically Signed    LL/MEDQ  D:  04/03/2007  T:  04/03/2007  Job:  [id number]   cc:   Bonne Dolores, M.D.  Fax: [phone]

## 2011-04-25 ENCOUNTER — Encounter (HOSPITAL_COMMUNITY): Payer: Medicare Other

## 2011-04-25 ENCOUNTER — Encounter (HOSPITAL_COMMUNITY): Payer: Medicare Other | Attending: Oncology | Admitting: Oncology

## 2011-04-25 ENCOUNTER — Other Ambulatory Visit (HOSPITAL_COMMUNITY): Payer: Self-pay | Admitting: Oncology

## 2011-04-25 DIAGNOSIS — Z79899 Other long term (current) drug therapy: Secondary | ICD-10-CM | POA: Insufficient documentation

## 2011-04-25 DIAGNOSIS — C50919 Malignant neoplasm of unspecified site of unspecified female breast: Secondary | ICD-10-CM

## 2011-04-25 DIAGNOSIS — Z853 Personal history of malignant neoplasm of breast: Secondary | ICD-10-CM | POA: Insufficient documentation

## 2011-04-25 DIAGNOSIS — D509 Iron deficiency anemia, unspecified: Secondary | ICD-10-CM | POA: Insufficient documentation

## 2011-04-25 DIAGNOSIS — E119 Type 2 diabetes mellitus without complications: Secondary | ICD-10-CM | POA: Insufficient documentation

## 2011-04-25 LAB — CBC
MCH: 29.5 pg (ref 26.0–34.0)
MCHC: 33.1 g/dL (ref 30.0–36.0)
Platelets: 165 10*3/uL (ref 150–400)

## 2011-04-25 LAB — COMPREHENSIVE METABOLIC PANEL
ALT: 17 U/L (ref 0–35)
Albumin: 3.7 g/dL (ref 3.5–5.2)
Alkaline Phosphatase: 68 U/L (ref 39–117)
Chloride: 95 mEq/L — ABNORMAL LOW (ref 96–112)
Glucose, Bld: 509 mg/dL — ABNORMAL HIGH (ref 70–99)
Potassium: 4.6 mEq/L (ref 3.5–5.1)
Sodium: 133 mEq/L — ABNORMAL LOW (ref 135–145)
Total Bilirubin: 0.3 mg/dL (ref 0.3–1.2)
Total Protein: 7.2 g/dL (ref 6.0–8.3)

## 2011-04-25 LAB — DIFFERENTIAL
Basophils Relative: 1 % (ref 0–1)
Eosinophils Absolute: 0.3 10*3/uL (ref 0.0–0.7)
Monocytes Absolute: 0.3 10*3/uL (ref 0.1–1.0)
Monocytes Relative: 4 % (ref 3–12)
Neutrophils Relative %: 49 % (ref 43–77)

## 2011-04-27 ENCOUNTER — Ambulatory Visit (HOSPITAL_COMMUNITY): Admission: RE | Admit: 2011-04-27 | Payer: Medicare Other | Source: Ambulatory Visit

## 2011-04-30 NOTE — Op Note (Signed)
  NAMEANASTASIYA, [name]                  ACCOUNT NO.:  [id number]  MEDICAL RECORD NO.:  [id number]           PATIENT TYPE:  O  LOCATION:  DAYP                          FACILITY:  [facility]  PHYSICIAN:  Hildred Laser, M.D.    DATE OF BIRTH:  12/29/1932  DATE OF PROCEDURE:  04/11/2011 DATE OF DISCHARGE:                              OPERATIVE REPORT   PROCEDURE:  Colonoscopy.  INDICATION:  [name] is a 77 year old Caucasian female with multiple medical problems including diabetes mellitus and peripheral neuropathy who has irregular bowel movements, intermittent rectal pain, and heme- positive stools.  She is undergoing diagnostic colonoscopy.  The patient's last exam was in January 2005.  Procedure risks were reviewed with the patient and informed consent was obtained.  MEDICATIONS FOR CONSCIOUS SEDATION:  Demerol 50 mg IV and Versed 10 mg IV in divided dose.  FINDINGS:  Procedure was performed in Endoscopy Suite.  The patient's vital signs and O2 sat were monitored during the procedure and remained stable.  The patient was placed in left lateral position.  Rectal examination was performed.  No abnormality was noted on external or digital exam.  Pentax videoscope was placed through rectum and advanced under vision into sigmoid colon and beyond.  Preparation was satisfactory.  Few small diverticula noted, scattered at the sigmoid colon.  Scope was passed into cecum which was identified by appendiceal orifice and ileocecal valve.  Pictures were taken for the record.  As the scope was withdrawn, colonic mucosa was carefully examined and was normal throughout.  Rectal mucosa similarly was normal.  Scope was retroflexed to examine anorectal junction and small hemorrhoids were noted below the dentate line.  Endoscope was then withdrawn.  Withdrawal time was 12 minutes.  The patient tolerated the procedure well.  FINAL DIAGNOSES:  Examination performed, cecum.  Few diverticula at sigmoid colon  and external hemorrhoids, otherwise normal examination.  I suspect her rectal pain and irregular bowel movements secondary to irritable bowel syndrome.  RECOMMENDATIONS: 1. High-fiber diet. 2. Fiber supplement 3-4 g daily. 3. Levsin SL t.i.d. p.r.n., prescription given for #30 with 1 refill.    The patient is advised not to take dicyclomine. 4. She will call for office visit if symptoms persist.          ______________________________ Hildred Laser, M.D.     NR/MEDQ  D:  04/11/2011  T:  04/11/2011  Job:  [id number]  cc:   Delsa Bern, PA-C [place]  Electronically Signed by Hildred Laser M.D. on 04/30/2011 06:33:20 PM

## 2011-05-08 ENCOUNTER — Ambulatory Visit (HOSPITAL_COMMUNITY)
Admission: RE | Admit: 2011-05-08 | Discharge: 2011-05-08 | Disposition: A | Discharge status: Home / Self Care | Payer: Medicare Other | Source: Ambulatory Visit | Attending: Oncology | Admitting: Oncology

## 2011-05-08 ENCOUNTER — Other Ambulatory Visit (HOSPITAL_COMMUNITY): Payer: Self-pay | Admitting: Oncology

## 2011-05-08 DIAGNOSIS — Z139 Encounter for screening, unspecified: Secondary | ICD-10-CM

## 2011-05-08 DIAGNOSIS — Z1231 Encounter for screening mammogram for malignant neoplasm of breast: Secondary | ICD-10-CM | POA: Insufficient documentation

## 2011-06-07 ENCOUNTER — Encounter (HOSPITAL_COMMUNITY): Payer: Medicare Other | Attending: Oncology

## 2011-06-07 DIAGNOSIS — E119 Type 2 diabetes mellitus without complications: Secondary | ICD-10-CM | POA: Insufficient documentation

## 2011-06-07 DIAGNOSIS — Z853 Personal history of malignant neoplasm of breast: Secondary | ICD-10-CM | POA: Insufficient documentation

## 2011-06-07 DIAGNOSIS — C50919 Malignant neoplasm of unspecified site of unspecified female breast: Secondary | ICD-10-CM

## 2011-06-07 DIAGNOSIS — D509 Iron deficiency anemia, unspecified: Secondary | ICD-10-CM | POA: Insufficient documentation

## 2011-06-07 DIAGNOSIS — Z79899 Other long term (current) drug therapy: Secondary | ICD-10-CM | POA: Insufficient documentation

## 2011-06-07 DIAGNOSIS — Z452 Encounter for adjustment and management of vascular access device: Secondary | ICD-10-CM

## 2011-06-14 ENCOUNTER — Encounter: Payer: Self-pay | Admitting: Adult Health

## 2011-06-22 ENCOUNTER — Ambulatory Visit: Payer: Medicare Other | Admitting: Adult Health

## 2011-07-11 ENCOUNTER — Ambulatory Visit (INDEPENDENT_AMBULATORY_CARE_PROVIDER_SITE_OTHER): Payer: Medicare Other | Admitting: Cardiology

## 2011-07-11 ENCOUNTER — Encounter: Payer: Self-pay | Admitting: Cardiology

## 2011-07-11 DIAGNOSIS — I350 Nonrheumatic aortic (valve) stenosis: Secondary | ICD-10-CM | POA: Insufficient documentation

## 2011-07-11 DIAGNOSIS — I359 Nonrheumatic aortic valve disorder, unspecified: Secondary | ICD-10-CM

## 2011-07-11 DIAGNOSIS — N189 Chronic kidney disease, unspecified: Secondary | ICD-10-CM | POA: Insufficient documentation

## 2011-07-11 DIAGNOSIS — I10 Essential (primary) hypertension: Secondary | ICD-10-CM

## 2011-07-11 DIAGNOSIS — E785 Hyperlipidemia, unspecified: Secondary | ICD-10-CM

## 2011-07-11 DIAGNOSIS — I679 Cerebrovascular disease, unspecified: Secondary | ICD-10-CM

## 2011-07-11 DIAGNOSIS — E119 Type 2 diabetes mellitus without complications: Secondary | ICD-10-CM

## 2011-07-11 DIAGNOSIS — I4892 Unspecified atrial flutter: Secondary | ICD-10-CM

## 2011-07-11 DIAGNOSIS — C50919 Malignant neoplasm of unspecified site of unspecified female breast: Secondary | ICD-10-CM

## 2011-07-11 DIAGNOSIS — E782 Mixed hyperlipidemia: Secondary | ICD-10-CM

## 2011-07-11 DIAGNOSIS — M797 Fibromyalgia: Secondary | ICD-10-CM | POA: Insufficient documentation

## 2011-07-11 DIAGNOSIS — C50A0 Malignant inflammatory neoplasm of unspecified breast: Secondary | ICD-10-CM | POA: Insufficient documentation

## 2011-07-11 DIAGNOSIS — K589 Irritable bowel syndrome without diarrhea: Secondary | ICD-10-CM

## 2011-07-11 DIAGNOSIS — D649 Anemia, unspecified: Secondary | ICD-10-CM

## 2011-07-11 DIAGNOSIS — Z9181 History of falling: Secondary | ICD-10-CM

## 2011-07-11 MED ORDER — LISINOPRIL-HYDROCHLOROTHIAZIDE 20-25 MG PO TABS: 1.0000 | ORAL_TABLET | Freq: Every day | ORAL | Status: DC

## 2011-07-11 MED ORDER — METOPROLOL TARTRATE 50 MG PO TABS: 75.0000 mg | ORAL_TABLET | Freq: Two times a day (BID) | ORAL | Status: DC

## 2011-07-11 NOTE — Assessment & Plan Note (Addendum)
She is followed by both Dr. Marlou Starks and Dr. Tressie Stalker.  A subcutaneous port placed for chemotherapy is being maintained, and removal has not been considered advisable.

## 2011-07-11 NOTE — Assessment & Plan Note (Signed)
No recurrence of arrhythmia has been documented for quite some time.  Nocturnal palpitations likely are related to anxiety.  She does not appear to require diagnostic testing at present.

## 2011-07-11 NOTE — Assessment & Plan Note (Addendum)
Renal function has been variable over the past few years with the most recent assessment indicating class III disease.  Improvement in recent months may reflect discontinuation of furosemide.  A repeat level will be obtained in conjunction with a metabolic profile.

## 2011-07-11 NOTE — Assessment & Plan Note (Signed)
Blood pressure control slightly suboptimal.  Dose of chlorthalidone will be increased to 25 mg q.d., and patient will reassess blood pressures outside of the office.  She will return for reassessment by the cardiology nurses in one month.  Addition of a calcium channel antagonist will likely be necessary at that time.

## 2011-07-11 NOTE — Patient Instructions (Signed)
Your physician has recommended you make the following change in your medication: change Lisinopril to 20/25 mg daily with next refill and increase Metoprolol to 75 mg twice daily  Your physician recommends that you return for lab work in: next week  Your physician has requested that you regularly monitor and record your blood pressure readings at home. Please use the same machine at the same time of day to check your readings and record them to bring to your follow-up visit. You may check blood pressures at pharmacy. Please bring blood pressure diary to nurse visit  Your physician recommends that you schedule a follow-up appointment in: 1 month for a blood pressure check with nurse and in 1 year with provider

## 2011-07-11 NOTE — Assessment & Plan Note (Signed)
Prior echocardiograms reviewed.  Aortic stenosis appears to be trivial and will likely never be a significant clinical problem for this nice woman.

## 2011-07-11 NOTE — Progress Notes (Signed)
HPI : [name] returns to the office as scheduled for continued assessment and treatment of hypertension, a history of CHF with preserved left ventricular systolic function and a history of atrial arrhythmias and aortic valve disease.  Since her last visit, she has done well from a cardiac standpoint.  She has experienced no chest discomfort and had stable class 2-3 dyspnea on exertion.  She is able to do light housework without difficulty.  Control of diabetes has been improved since Dr. Fransico Beard became involved in her care.   Pedal edema has been modest, and she has not utilized furosemide for some time.  Current Outpatient Prescriptions on File Prior to Visit  Medication Sig Dispense Refill  . albuterol (PROAIR HFA) 108 (90 BASE) MCG/ACT inhaler Inhale 2 puffs into the lungs every 6 (six) hours as needed.        . Artificial Tear Solution (TEARS NATURALE II) SOLN Apply to eye as needed.        Marland Kitchen aspirin (ASPIR-LOW) 81 MG EC tablet Take 81 mg by mouth daily.        . diazepam (VALIUM) 10 MG tablet Take 10 mg by mouth every 6 (six) hours as needed.        . docusate sodium (COLACE) 100 MG capsule Take 100 mg by mouth daily.        . fenofibrate 160 MG tablet Take 160 mg by mouth daily.        . furosemide (LASIX) 40 MG tablet Take 40 mg by mouth as needed.       Marland Kitchen HYDROcodone-acetaminophen (LORCET) 10-650 MG per tablet Take 1 tablet by mouth every morning. .5 pill every AM       . Insulin Glargine (LANTUS [place]) Inject 50 Units into the skin every morning.       . insulin NPH-insulin regular (NOVOLIN 70/30) (70-30) 100 UNIT/ML injection Inject 12 Units into the skin 3 (three) times daily. Sliding scale      . magnesium oxide (MAG-OX) 400 MG tablet Take 400 mg by mouth daily.        . Multiple Vitamin (DAILY VITES PO) Take 1 tablet by mouth daily.        Marland Kitchen PARoxetine (PAXIL) 40 MG tablet Take 40 mg by mouth every morning.        Marland Kitchen POTASSIUM PO Take 20 mEq by mouth 2 (two) times daily.       . pregabalin  (LYRICA) 50 MG capsule Take 50 mg by mouth 3 (three) times daily.        . Rosuvastatin Calcium (CRESTOR PO) Take 40 mg by mouth daily.       . traMADol (ULTRAM) 50 MG tablet Take 50 mg by mouth every 6 (six) hours as needed.        Marland Kitchen DISCONTD: metoprolol (LOPRESSOR) 50 MG tablet Take 50 mg by mouth 2 (two) times daily.        Marland Kitchen DISCONTD: PRINZIDE 20-12.5 MG per tablet TAKE 1 TABLET BY MOUTH   ONCE A DAY.  30 each  3     Allergies  Allergen Reactions  . Imodium (Loperamide Hcl)   . Mold Extract (Trichophyton Mentagrophyte)     Mildew, dust  . Niaspan (Niacin)       Past medical history, social history, and family history reviewed and updated.  ROS: see history of present illness; all other systems reviewed and are negative.  PHYSICAL EXAM: BP 145/63  Pulse 83  Ht 5\' 11"  (1.803  m)  Wt 107.502 kg (237 lb)  BMI 33.05 kg/m2  SpO2 95% ; weight gain of 15 pounds since her last visit General-Well developed; no acute distress Body habitus-obese Neck-No JVD; no carotid bruits Lungs-clear lung fields; resonant to percussion Cardiovascular-normal PMI; normal S1 and S2 Abdomen-normal bowel sounds; soft and non-tender without masses or organomegaly Musculoskeletal-No deformities, no cyanosis or clubbing Neurologic-Normal cranial nerves; symmetric strength and tone Skin-Warm, no significant lesions Extremities-distal pulses intact; 1/2+ edema  Rhythm strip: Normal sinus rhythm at a rate of 71 with sinus arrhythmia  ASSESSMENT AND PLAN: theas

## 2011-07-11 NOTE — Assessment & Plan Note (Signed)
Minimal and possibly related to chronic kidney disease.Marland Kitchen

## 2011-07-11 NOTE — Assessment & Plan Note (Signed)
Suboptimal control of hyperlipidemia when last assessed, but therapy has changed.  A repeat lipid profile will be obtained.

## 2011-07-11 NOTE — Assessment & Plan Note (Addendum)
Glucose was 509 in 04/2011.  Control is reportedly much improved with CBGs of 150-200 under the care of Dr.Nida.  Hemoglobin A1c level is pending.

## 2011-07-11 NOTE — Assessment & Plan Note (Signed)
Patient has not suffered a recurrent fall despite being somewhat unsteady on her feet.  She tries to be very careful when she is up and about.

## 2011-07-19 ENCOUNTER — Encounter (HOSPITAL_COMMUNITY): Payer: Medicare Other

## 2011-07-19 ENCOUNTER — Encounter (HOSPITAL_COMMUNITY): Payer: Medicare Other | Attending: Oncology

## 2011-07-19 DIAGNOSIS — E119 Type 2 diabetes mellitus without complications: Secondary | ICD-10-CM | POA: Insufficient documentation

## 2011-07-19 DIAGNOSIS — Z853 Personal history of malignant neoplasm of breast: Secondary | ICD-10-CM | POA: Insufficient documentation

## 2011-07-19 DIAGNOSIS — Z79899 Other long term (current) drug therapy: Secondary | ICD-10-CM | POA: Insufficient documentation

## 2011-07-19 DIAGNOSIS — D509 Iron deficiency anemia, unspecified: Secondary | ICD-10-CM | POA: Insufficient documentation

## 2011-07-26 ENCOUNTER — Other Ambulatory Visit: Payer: Self-pay | Admitting: Cardiology

## 2011-07-27 LAB — COMPREHENSIVE METABOLIC PANEL
Alkaline Phosphatase: 53 U/L (ref 39–117)
Creat: 1.36 mg/dL — ABNORMAL HIGH (ref 0.50–1.10)
Glucose, Bld: 221 mg/dL — ABNORMAL HIGH (ref 70–99)
Sodium: 140 mEq/L (ref 135–145)
Total Bilirubin: 0.3 mg/dL (ref 0.3–1.2)
Total Protein: 6.7 g/dL (ref 6.0–8.3)

## 2011-07-27 LAB — LIPID PANEL
Cholesterol: 190 mg/dL (ref 0–200)
LDL Cholesterol: 79 mg/dL (ref 0–99)
Total CHOL/HDL Ratio: 3.6 Ratio
Triglycerides: 290 mg/dL — ABNORMAL HIGH (ref <150)
VLDL: 58 mg/dL — ABNORMAL HIGH (ref 0–40)

## 2011-08-23 LAB — CBC
HCT: 33 — ABNORMAL LOW
Hemoglobin: 11.1 — ABNORMAL LOW
MCHC: 33.8
MCV: 85.9
Platelets: 176
RDW: 13.6

## 2011-08-23 LAB — COMPREHENSIVE METABOLIC PANEL
Albumin: 3.4 — ABNORMAL LOW
BUN: 23
Calcium: 9
Creatinine, Ser: 1.41 — ABNORMAL HIGH
Glucose, Bld: 236 — ABNORMAL HIGH
Potassium: 4.5
Total Protein: 6.5

## 2011-08-23 LAB — DIFFERENTIAL
Basophils Relative: 1
Lymphocytes Relative: 39
Lymphs Abs: 1.8
Monocytes Absolute: 0.3
Monocytes Relative: 7
Neutro Abs: 2.2
Neutrophils Relative %: 47

## 2011-08-23 LAB — CANCER ANTIGEN 27.29: CA 27.29: 16

## 2011-08-27 LAB — COMPREHENSIVE METABOLIC PANEL
ALT: 15
AST: 15
CO2: 25
Calcium: 8.8
Chloride: 105
Creatinine, Ser: 1.59 — ABNORMAL HIGH
GFR calc Af Amer: 39 — ABNORMAL LOW
GFR calc non Af Amer: 32 — ABNORMAL LOW
Glucose, Bld: 197 — ABNORMAL HIGH
Sodium: 137
Total Bilirubin: 0.6

## 2011-08-27 LAB — DIFFERENTIAL
Basophils Absolute: 0
Eosinophils Absolute: 0.2
Eosinophils Relative: 5
Neutrophils Relative %: 51

## 2011-08-27 LAB — CBC
Hemoglobin: 10.5 — ABNORMAL LOW
MCHC: 33.9
MCV: 86.9
RBC: 3.58 — ABNORMAL LOW
WBC: 5.4

## 2011-08-29 ENCOUNTER — Other Ambulatory Visit: Payer: Self-pay | Admitting: Cardiology

## 2011-08-30 ENCOUNTER — Encounter (HOSPITAL_COMMUNITY): Payer: Medicare Other

## 2011-08-30 ENCOUNTER — Other Ambulatory Visit (HOSPITAL_COMMUNITY): Payer: Self-pay | Admitting: Internal Medicine

## 2011-08-30 DIAGNOSIS — G8929 Other chronic pain: Secondary | ICD-10-CM

## 2011-08-30 DIAGNOSIS — Z139 Encounter for screening, unspecified: Secondary | ICD-10-CM

## 2011-08-30 LAB — DIFFERENTIAL
Basophils Relative: 1
Eosinophils Absolute: 0.4
Eosinophils Relative: 7 — ABNORMAL HIGH
Monocytes Absolute: 0.4
Monocytes Relative: 6
Neutro Abs: 3.5

## 2011-08-30 LAB — COMPREHENSIVE METABOLIC PANEL
ALT: 17
AST: 16
Albumin: 3.5
Alkaline Phosphatase: 65
Chloride: 101
GFR calc Af Amer: 42 — ABNORMAL LOW
Potassium: 5.2 — ABNORMAL HIGH
Sodium: 136
Total Protein: 6.7

## 2011-08-30 LAB — CBC
Platelets: 187
RDW: 13.5
WBC: 6.6

## 2011-08-30 LAB — CANCER ANTIGEN 27.29: CA 27.29: 22

## 2011-09-04 ENCOUNTER — Ambulatory Visit (HOSPITAL_COMMUNITY)
Admission: RE | Admit: 2011-09-04 | Discharge: 2011-09-04 | Disposition: A | Discharge status: Home / Self Care | Payer: Medicare Other | Source: Ambulatory Visit | Attending: Internal Medicine | Admitting: Internal Medicine

## 2011-09-04 DIAGNOSIS — Z139 Encounter for screening, unspecified: Secondary | ICD-10-CM

## 2011-09-04 DIAGNOSIS — G8929 Other chronic pain: Secondary | ICD-10-CM | POA: Insufficient documentation

## 2011-09-04 DIAGNOSIS — M949 Disorder of cartilage, unspecified: Secondary | ICD-10-CM | POA: Insufficient documentation

## 2011-09-04 DIAGNOSIS — Z13820 Encounter for screening for osteoporosis: Secondary | ICD-10-CM | POA: Insufficient documentation

## 2011-09-04 DIAGNOSIS — M899 Disorder of bone, unspecified: Secondary | ICD-10-CM | POA: Insufficient documentation

## 2011-09-06 ENCOUNTER — Encounter (HOSPITAL_COMMUNITY): Payer: Medicare Other

## 2011-09-11 ENCOUNTER — Encounter (HOSPITAL_COMMUNITY): Payer: Medicare Other | Attending: Oncology

## 2011-09-11 ENCOUNTER — Telehealth (HOSPITAL_COMMUNITY): Payer: Self-pay | Admitting: *Deleted

## 2011-09-11 DIAGNOSIS — C50919 Malignant neoplasm of unspecified site of unspecified female breast: Secondary | ICD-10-CM

## 2011-09-11 DIAGNOSIS — Z452 Encounter for adjustment and management of vascular access device: Secondary | ICD-10-CM

## 2011-09-11 LAB — COMPREHENSIVE METABOLIC PANEL
ALT: 16
CO2: 23
Calcium: 9.2
Chloride: 112
GFR calc non Af Amer: 30 — ABNORMAL LOW
Glucose, Bld: 135 — ABNORMAL HIGH
Sodium: 140
Total Bilirubin: 0.4

## 2011-09-11 LAB — DIFFERENTIAL
Basophils Absolute: 0
Basophils Relative: 1
Eosinophils Absolute: 0.4
Lymphs Abs: 2.5
Neutrophils Relative %: 56

## 2011-09-11 LAB — CBC
Hemoglobin: 10.7 — ABNORMAL LOW
MCHC: 33.3
Platelets: 190
RDW: 14.8 — ABNORMAL HIGH

## 2011-09-11 MED ORDER — SODIUM CHLORIDE 0.9 % IJ SOLN
INTRAMUSCULAR | Status: AC
  Administered 2011-09-11: 10 mL via INTRAVENOUS
  Filled 2011-09-11: qty 10

## 2011-09-11 MED ORDER — HEPARIN SOD (PORK) LOCK FLUSH 100 UNIT/ML IV SOLN
500.0000 [IU] | Freq: Once | INTRAVENOUS | Status: AC
  Administered 2011-09-11: 500 [IU] via INTRAVENOUS
  Filled 2011-09-11: qty 5

## 2011-09-11 MED ORDER — SODIUM CHLORIDE 0.9 % IJ SOLN
10.0000 mL | Freq: Once | INTRAMUSCULAR | Status: AC
  Administered 2011-09-11: 10 mL via INTRAVENOUS
  Filled 2011-09-11: qty 10

## 2011-09-11 MED ORDER — HEPARIN SOD (PORK) LOCK FLUSH 100 UNIT/ML IV SOLN
INTRAVENOUS | Status: AC
  Administered 2011-09-11: 500 [IU] via INTRAVENOUS
  Filled 2011-09-11: qty 5

## 2011-09-11 NOTE — Progress Notes (Signed)
[name] presented for Portacath access and flush. Proper placement of portacath confirmed by CXR. Portacath located left chest wall accessed with  H 20 needle. Good blood return present. Portacath flushed with 46ml NS and 500U/27ml Heparin and needle removed intact. Procedure without incident. Patient tolerated procedure well.

## 2011-09-11 NOTE — Telephone Encounter (Signed)
Reviewed bone density with Lamara and she states that she is takin Vitamin D 400 units daily and that she stopped calcium because one of her Dr.s stopped it but she doesn't remember who or why.

## 2011-09-13 LAB — DIFFERENTIAL
Lymphocytes Relative: 36
Lymphs Abs: 2.2
Monocytes Relative: 6
Neutro Abs: 3
Neutrophils Relative %: 49

## 2011-09-13 LAB — CBC
Platelets: 196
RBC: 3.73 — ABNORMAL LOW
WBC: 6.1

## 2011-09-17 LAB — COMPREHENSIVE METABOLIC PANEL
ALT: 17
AST: 15
CO2: 24
Calcium: 8.5
Chloride: 105
Creatinine, Ser: 1.4 — ABNORMAL HIGH
GFR calc non Af Amer: 37 — ABNORMAL LOW
Glucose, Bld: 243 — ABNORMAL HIGH
Total Bilirubin: 0.4

## 2011-09-17 LAB — DIFFERENTIAL
Basophils Absolute: 0
Eosinophils Absolute: 0.5
Eosinophils Relative: 7 — ABNORMAL HIGH
Lymphocytes Relative: 28
Lymphs Abs: 2.1
Neutrophils Relative %: 60

## 2011-09-17 LAB — CBC
Hemoglobin: 10 — ABNORMAL LOW
MCHC: 33
MCV: 84.7
RBC: 3.58 — ABNORMAL LOW
WBC: 7.4

## 2011-09-18 LAB — COMPREHENSIVE METABOLIC PANEL
ALT: 10
AST: 11
Albumin: 2.9 — ABNORMAL LOW
Albumin: 2.9 — ABNORMAL LOW
Alkaline Phosphatase: 81
BUN: 33 — ABNORMAL HIGH
CO2: 27
Calcium: 8.7
Calcium: 9
Creatinine, Ser: 1.17
Creatinine, Ser: 1.45 — ABNORMAL HIGH
GFR calc Af Amer: 55 — ABNORMAL LOW
Glucose, Bld: 215 — ABNORMAL HIGH
Sodium: 136
Total Protein: 5.6 — ABNORMAL LOW

## 2011-09-18 LAB — DIFFERENTIAL
Basophils Relative: 1
Eosinophils Absolute: 0.6
Lymphocytes Relative: 35
Monocytes Relative: 6
Neutrophils Relative %: 51

## 2011-09-18 LAB — CBC
HCT: 33.3 — ABNORMAL LOW
Hemoglobin: 10.9 — ABNORMAL LOW
MCHC: 32.8
MCV: 86.4
Platelets: 228
RDW: 15.8 — ABNORMAL HIGH

## 2011-09-19 LAB — DIFFERENTIAL
Eosinophils Absolute: 1.1 — ABNORMAL HIGH
Lymphs Abs: 1.8
Monocytes Absolute: 0.4
Monocytes Relative: 5
Neutro Abs: 4.5
Neutrophils Relative %: 57

## 2011-09-19 LAB — CBC
Hemoglobin: 9.9 — ABNORMAL LOW
MCV: 86.8
RBC: 3.32 — ABNORMAL LOW
WBC: 7.9

## 2011-09-20 LAB — CBC
Hemoglobin: 11.9 — ABNORMAL LOW
MCHC: 33
MCV: 90.4
RBC: 4.01

## 2011-09-20 LAB — CLOSTRIDIUM DIFFICILE EIA: C difficile Toxins A+B, EIA: NEGATIVE

## 2011-09-20 LAB — COMPREHENSIVE METABOLIC PANEL
CO2: 24
Calcium: 9.7
Creatinine, Ser: 1.4 — ABNORMAL HIGH
GFR calc non Af Amer: 37 — ABNORMAL LOW
Glucose, Bld: 127 — ABNORMAL HIGH

## 2011-09-20 LAB — DIFFERENTIAL
Lymphocytes Relative: 27
Lymphs Abs: 1.7
Neutro Abs: 4
Neutrophils Relative %: 63

## 2011-10-04 HISTORY — PX: EYE SURGERY: SHX253

## 2011-10-11 ENCOUNTER — Encounter (HOSPITAL_COMMUNITY): Payer: Medicare Other

## 2011-10-11 ENCOUNTER — Encounter (HOSPITAL_COMMUNITY): Payer: Medicare Other | Attending: Oncology

## 2011-10-11 DIAGNOSIS — C773 Secondary and unspecified malignant neoplasm of axilla and upper limb lymph nodes: Secondary | ICD-10-CM

## 2011-10-11 DIAGNOSIS — Z452 Encounter for adjustment and management of vascular access device: Secondary | ICD-10-CM

## 2011-10-11 DIAGNOSIS — C50419 Malignant neoplasm of upper-outer quadrant of unspecified female breast: Secondary | ICD-10-CM

## 2011-10-11 DIAGNOSIS — C50919 Malignant neoplasm of unspecified site of unspecified female breast: Secondary | ICD-10-CM

## 2011-10-11 MED ORDER — HEPARIN SOD (PORK) LOCK FLUSH 100 UNIT/ML IV SOLN
500.0000 [IU] | Freq: Once | INTRAVENOUS | Status: AC
  Administered 2011-10-11: 500 [IU] via INTRAVENOUS
  Filled 2011-10-11: qty 5

## 2011-10-11 MED ORDER — HEPARIN SOD (PORK) LOCK FLUSH 100 UNIT/ML IV SOLN
INTRAVENOUS | Status: AC
  Administered 2011-10-11: 500 [IU] via INTRAVENOUS
  Filled 2011-10-11: qty 5

## 2011-10-11 MED ORDER — SODIUM CHLORIDE 0.9 % IJ SOLN
10.0000 mL | INTRAMUSCULAR | Status: DC | PRN
  Administered 2011-10-11: 10 mL via INTRAVENOUS
  Filled 2011-10-11: qty 10

## 2011-10-11 NOTE — Progress Notes (Signed)
[name] presented for Portacath access and flush. Proper placement of portacath confirmed by CXR. Portacath located lt chest wall accessed with  H 20 needle. Good blood return present. Portacath flushed with 74ml NS and 500U/78ml Heparin and needle removed intact. Procedure without incident. Patient tolerated procedure well.

## 2011-10-31 ENCOUNTER — Ambulatory Visit (HOSPITAL_COMMUNITY): Payer: Medicare Other | Admitting: Oncology

## 2011-11-08 ENCOUNTER — Other Ambulatory Visit: Payer: Self-pay | Admitting: Cardiology

## 2011-11-09 ENCOUNTER — Encounter (INDEPENDENT_AMBULATORY_CARE_PROVIDER_SITE_OTHER): Payer: Self-pay | Admitting: General Surgery

## 2011-11-14 ENCOUNTER — Encounter (HOSPITAL_COMMUNITY): Payer: Medicare Other | Attending: Oncology | Admitting: Oncology

## 2011-11-14 ENCOUNTER — Encounter (HOSPITAL_COMMUNITY): Payer: Self-pay | Admitting: Oncology

## 2011-11-14 VITALS — BP 133/70 | HR 76 | Temp 97.9°F | Ht 71.0 in | Wt 244.0 lb

## 2011-11-14 DIAGNOSIS — C50919 Malignant neoplasm of unspecified site of unspecified female breast: Secondary | ICD-10-CM

## 2011-11-14 DIAGNOSIS — F341 Dysthymic disorder: Secondary | ICD-10-CM

## 2011-11-14 DIAGNOSIS — F329 Major depressive disorder, single episode, unspecified: Secondary | ICD-10-CM

## 2011-11-14 DIAGNOSIS — I998 Other disorder of circulatory system: Secondary | ICD-10-CM | POA: Insufficient documentation

## 2011-11-14 DIAGNOSIS — M1990 Unspecified osteoarthritis, unspecified site: Secondary | ICD-10-CM

## 2011-11-14 DIAGNOSIS — C50419 Malignant neoplasm of upper-outer quadrant of unspecified female breast: Secondary | ICD-10-CM

## 2011-11-14 DIAGNOSIS — Z171 Estrogen receptor negative status [ER-]: Secondary | ICD-10-CM

## 2011-11-14 DIAGNOSIS — I878 Other specified disorders of veins: Secondary | ICD-10-CM

## 2011-11-14 DIAGNOSIS — M25569 Pain in unspecified knee: Secondary | ICD-10-CM

## 2011-11-14 DIAGNOSIS — E669 Obesity, unspecified: Secondary | ICD-10-CM

## 2011-11-14 HISTORY — DX: Obesity, unspecified: E66.9

## 2011-11-14 HISTORY — DX: Unspecified osteoarthritis, unspecified site: M19.90

## 2011-11-14 MED ORDER — HEPARIN SOD (PORK) LOCK FLUSH 100 UNIT/ML IV SOLN
INTRAVENOUS | Status: AC
  Filled 2011-11-14: qty 5

## 2011-11-14 MED ORDER — HEPARIN SOD (PORK) LOCK FLUSH 100 UNIT/ML IV SOLN
500.0000 [IU] | Freq: Once | INTRAVENOUS | Status: AC
  Administered 2011-11-14: 500 [IU] via INTRAVENOUS
  Filled 2011-11-14: qty 5

## 2011-11-14 MED ORDER — SODIUM CHLORIDE 0.9 % IJ SOLN
10.0000 mL | INTRAMUSCULAR | Status: DC | PRN
  Administered 2011-11-14: 10 mL via INTRAVENOUS
  Filled 2011-11-14: qty 10

## 2011-11-14 MED ORDER — SODIUM CHLORIDE 0.9 % IJ SOLN
INTRAMUSCULAR | Status: AC
  Filled 2011-11-14: qty 10

## 2011-11-14 NOTE — Progress Notes (Signed)
[name] presented for Portacath access and flush. Proper placement of portacath confirmed by CXR. Portacath located left chest wall accessed with  H 20 needle. Good blood return present. Portacath flushed with 55ml NS and 500U/3ml Heparin and needle removed intact. Procedure without incident. Patient tolerated procedure well.

## 2011-11-14 NOTE — Patient Instructions (Signed)
NYIAH PIANKA  [id number] 1933-10-17  [facility]  Discharge Instructions  RECOMMENDATIONS MADE BY THE CONSULTANT AND ANY TEST RESULTS WILL BE SENT TO YOUR REFERRING DOCTOR.   EXAM FINDINGS BY MD TODAY AND SIGNS AND SYMPTOMS TO REPORT TO CLINIC OR PRIMARY MD: your are doing well.  No evidence of recurrence by exam.  MEDICATIONS PRESCRIBED: none   INSTRUCTIONS GIVEN AND DISCUSSED: Other :  Report any new lumps, bone pain or shortness of breath  SPECIAL INSTRUCTIONS/FOLLOW-UP: Return to Clinic on : for port flush every 6 weeks and to be seen in follow-up in 6 months.   I acknowledge that I have been informed and understand all the instructions given to me and received a copy. I do not have any more questions at this time, but understand that I may call the [facility]ty Clinic at [facility] at [phone] during business hours should I have any further questions or need assistance in obtaining follow-up care.    __________________________________________  _____________  __________ Signature of Patient or Authorized Representative            Date                   Time    __________________________________________ Nurse's Signature

## 2011-11-14 NOTE — Progress Notes (Signed)
Lauren Grills, MD [address] [place] [address]  1. Breast carcinoma     INTERVAL HISTORY: [name] 77 y.o. female returns for  regular  visit for followup of right-sided inflammatory carcinoma of the breast with her initial biopsy on 10/22/2006. This was an ER, PR negative cancer but HER-2 3+ positive. She is status post FEC x4 cycles followed by surgery. She was then treated with Taxotere and Navelbine along with the initiation of Herceptin.  The Taxotere and Navelbine were only tolerated for 2 cycles. She had developed a very slow wound healing. She declined radiation therapy but did take Herceptin for one year total.   The patient recounts her previous low back surgical procedures by Dr. Saintclair Beard and Dr. Joya Beard.  She reports that prior to those procedures, she was receiving knee steroid injections under the care of Dr. Aline Beard.  She reports that these injections were discontinued because it was believed that the root cause of her left knee pain was from the DJD of her low back.  Following her spinal procedures, her left knee never improved.  I have offered her a referral back to Dr. Aline Beard for consideration of steroid injections, but she has declined at this time.  The patient denies any other complaints.  She is doing well.  She is due to have some lab work performed next week.  As a result, we will hold off on doing any her today and I have asked her to have the results faxed to the [facility].     Past Medical History  Diagnosis Date  . Breast carcinoma   . Anemia   . IBS (irritable bowel syndrome)     diverticulosis, gastroesophageal reflux disease  . Hypertension   . Asthma   . Diabetes mellitus   . Hyperlipidemia   . Arthritis   . Headaches, cluster   . Lower back pain   . Aortic stenosis     Mild  . Chronic kidney disease     Creatinine-1.5 in 2010 and 2.5-3 in 2011; 1.5-10/2010;  Klebsiella UTI-10/2010. urine protein 36  mg/dl, mildly elevated  . Depression   . Fibromyalgia   . Cerebrovascular disease     with a 83% LICA; repeat study in 10/2009-no obstructive disease; modest ASVD  . Falls infrequently 01/2010    fracture of pelvis and left humerus  . Atrial flutter 2002    has DIABETES MELLITUS, TYPE II; HYPERLIPIDEMIA; DEPRESSION/ANXIETY; Atrial flutter; ORTHOSTATIC HYPOTENSION; SPINAL STENOSIS, LUMBAR; Breast carcinoma; Anemia; IBS (irritable bowel syndrome); Hypertension; Aortic stenosis; Chronic kidney disease; Fibromyalgia; Cerebrovascular disease; and Falls infrequently on her problem list.     is allergic to imodium; mold extract; and niaspan.  [name] does not currently have medications on file.  Past Surgical History  Procedure Date  . Mastectomy     Right breast for carcinoma  . Lumbar disc surgery     lumbosacral spine procedure x 2  . Appendectomy   . Cholecystectomy   . Cataract extraction, bilateral     Lens implants  . Central venous catheter tunneled insertion single lumen   . Colonoscopy 2012    Denies any headaches, dizziness, double vision, fevers, chills, night sweats, nausea, vomiting, diarrhea, constipation, chest pain, heart palpitations, shortness of breath, blood in stool, black tarry stool, urinary pain, urinary burning, urinary frequency, hematuria.   PHYSICAL EXAMINATION  ECOG PERFORMANCE STATUS: 2 - Symptomatic, <50% confined to bed  There were no  vitals filed for this visit.  GENERAL:alert, no distress, well nourished, well developed, comfortable, cooperative, obese, smiling and seen in wheelchair SKIN: skin color, texture, turgor are normal, no rashes or significant lesions HEAD: Normocephalic EYES: normal EARS: External ears normal OROPHARYNX:mucous membranes are moist  NECK: supple, no adenopathy, no bruits, thyroid normal size, non-tender, without nodularity, no stridor, non-tender, trachea midline LYMPH:  no palpable lymphadenopathy BREAST:not  examined LUNGS: clear to auscultation and percussion HEART: regular rate & rhythm, no murmurs, no gallops, S1 normal and S2 normal ABDOMEN:abdomen soft, non-tender, obese, normal bowel sounds and unable to assess for hepatosplenomegaly due to patient positioning in wheelchair BACK: Back symmetric, no curvature., No CVA tenderness EXTREMITIES:less then 2 second capillary refill, no joint deformities, effusion, or inflammation, no skin discoloration, no clubbing, no cyanosis, positive findings:  edema B/L LE edema 1+ pitting  NEURO: alert & oriented x 3 with fluent speech, no focal motor/sensory deficits   RADIOGRAPHIC STUDIES:  05/11/2011  DG SCREENING MAMMOGRAM LEFT  CC and MLO view(s) were taken of the left breast.  LEFT DIGITAL SCREENING MAMMOGRAM WITH CAD:  Comparison: Prior studies.  There are scattered fibroglandular densities. There is no dominant mass, architectural distortion  or calcification to suggest malignancy.  Images were processed with CAD.  IMPRESSION:  No mammographic evidence of malignancy. Suggest yearly screening mammography.  A result letter of this screening mammogram will be mailed directly to the patient.  ASSESSMENT: Negative - BI-RADS 1  Screening mammogram in 1 year.   PATHOLOGY: 05/07/2007  REPORT OF SURGICAL PATHOLOGY  Case #: [id number] Patient Name: [name]. Office Chart Number: N/A  MRN: [id number] Pathologist: Louanne Belton. Rodney Cruise, MD DOB/Age [birth year] (Age: 19) Gender: F Date Taken: 05/07/2007 Date Received: 05/07/2007  FINAL DIAGNOSIS  MICROSCOPIC EXAMINATION AND DIAGNOSIS  BREAST, RIGHT, MODIFIED RADICAL MASTECTOMY: - NO RESIDUAL CARCINOMA IDENTIFIED. - PREVIOUS BIOPSY SITE AND ADJACENT BREAST PARENCHYMA WITH EVIDENCE OF TUMOR REGRESSION. - SKIN WITH SEBORRHEIC KERATOSIS. - NIPPLE, DEEP MARGIN FREE OF TUMOR. - THREE ENLARGED LYMPH NODES PARTLY REPLACED BY FAT, ONE WITH EVIDENCE OF TUMOR REGRESSION.  COMMENT ONCOLOGY TABLE-BREAST,  INCISIONAL/EXCISIONAL BIOPSY OR MASTECTOMY WITH LYMPH NODES  1. Maximum tumor size (cm): No residual tumor identified. 2. Margins: Free of tumor 3. Vascular/Lymphatic invasion: Negative 4. Histology, invasive component: Ductal (based on previous needle biopsy [id number]) 5. Grade, invasive component (Elston-Ellis modified Scarff-Bloom-Richardson): III (based on needle biopsy specimen [id number]) 6. Extensive intraductal component (JCRT): Negative 7. Histology, in situ component: N/A 8. Grade, in situ component: N/A 9. Multicentric (separate tumors in different quadrants): No 10. Multifocal (separate tumors in same quadrant or biopsy): No 11. Axillary lymph nodes: #examined: 3 #with metastasis: 0     12. TNM Code: ypT0, ypN0, pMX 13. Breast Prognostic Markers: Case number [id number] Estrogen receptor 0, negative Progesterone receptor 0, negative Ki 67 (Mib-1) 45% Her 2 neu (HercepTest) 3+ Her 2 neu by FISH N/A 14. Non-neoplastic breast: Fibrocystic changes. 15. Comments: No residual carcinoma is identified in the breast biopsy specimen. At least one of three axillary lymph nodes shows evidence of tumor regression. Cytokeratin AE1/AE3 stain is performed on one lymph node (block labeled AA) which shows tumor regression is negative. No metastatic tumor is identified.  Cytokeratin AE1/AE3 stain control reacted appropriately. (EA:mj 05/09/07)  mj Date Reported: 05/09/2007 Jaquelyn Bitter A. Rodney Cruise, MD  Electronically Signed Out By EAA   Clinical information Right breast cancer (as)  specimen(s) obtained Breast, modified radical mastectomy and axillary lymph nodes, right  Gross Description Specimen:  Received fresh is a clinically right modified radical mastectomy. Weight: 2587 grams Size: 31 x 24.5 x 7.5 cm with an attached 12 x 8 x 4.5 cm axillary content Skin: 24.5 x 19 cm tan skin ellipse with a central 1 cm in greatest diameter erect nipple with the surrounding areola  and the surrounding skin displaying a prominent dimpling of the skin. There are also multiple brown wrinkled skin papule and skin macules ranging from 0.1 cm up to 1.6 x 1.2 cm. There is also a 6.5 x 0.3 cm well healed slightly oblique scar in the upper outer quadrant. Tumor/cavity: A 2.5 x 2 x 0.9 cm firm tan-white indurated process containing a 0.6 cm in greatest dimension biopsy cavity with a mammotome clip. The deep margin measures 3 cm from the tumor/cavity. Uninvolved parenchyma: Predominantly fatty with a scant amount of intermixing dense fibrous appearing tissue. Prognostic indicators: Will be obtained off of paraffin block if needed. Lymph nodes: Six nodules ranging from 0.2 cm up to 3.2 cm in greatest dimension, most have a rim of tan-pink tissue with fatty infiltration. Block summary: Twenty-nine blocks A = nipple and scar B = representative section of the largest skin lesion and prominent skin dimpling C-G = representative section of the biopsy tumor and cavity H = representative sections of deep margin and area of tumor/biopsy cavity I = upper inner quadrant sections, one to include deep margin J = lower inner quadrant sections, one to include deep margin K = representative sections of lower outer quadrant sections one to include deep margin L = single nodule, bisected M = single nodule, bisected N-S = single nodule, multiple sections T-CC = single nodule, multiple sections (SP:mj 05/07/07)  mj/    ASSESSMENT:  1. Inflammatory breast cancer, S/P right mastectomy followed by chemotherapy.  Declined radiation therapy. 2. DJD 3. Obesity 4. Depression/Anxiety 5. DM 6. HTN 7. IBS   PLAN:  1. Lab work today: CBC diff, CMET 2. Return in 6 months for follow-up 3. I personally reviewed and went over radiographic studies with the patient.    All questions were answered. The patient knows to call the clinic with any problems, questions or concerns. We can  certainly see the patient much sooner if necessary.  The patient and plan discussed with Everardo All, MD and he is in agreement with the aforementioned.  KEFALAS,THOMAS

## 2011-12-02 ENCOUNTER — Observation Stay (HOSPITAL_COMMUNITY)
Admission: EM | Admit: 2011-12-02 | Discharge: 2011-12-02 | Disposition: A | Discharge status: Home / Self Care | Payer: Medicare Other | Attending: Internal Medicine | Admitting: Internal Medicine

## 2011-12-02 ENCOUNTER — Emergency Department (HOSPITAL_COMMUNITY): Payer: Medicare Other

## 2011-12-02 ENCOUNTER — Encounter (HOSPITAL_COMMUNITY): Payer: Self-pay | Admitting: Emergency Medicine

## 2011-12-02 DIAGNOSIS — IMO0001 Reserved for inherently not codable concepts without codable children: Secondary | ICD-10-CM | POA: Insufficient documentation

## 2011-12-02 DIAGNOSIS — M1990 Unspecified osteoarthritis, unspecified site: Secondary | ICD-10-CM

## 2011-12-02 DIAGNOSIS — I679 Cerebrovascular disease, unspecified: Secondary | ICD-10-CM

## 2011-12-02 DIAGNOSIS — I10 Essential (primary) hypertension: Secondary | ICD-10-CM

## 2011-12-02 DIAGNOSIS — E119 Type 2 diabetes mellitus without complications: Secondary | ICD-10-CM

## 2011-12-02 DIAGNOSIS — S0180XA Unspecified open wound of other part of head, initial encounter: Principal | ICD-10-CM | POA: Insufficient documentation

## 2011-12-02 DIAGNOSIS — E785 Hyperlipidemia, unspecified: Secondary | ICD-10-CM

## 2011-12-02 DIAGNOSIS — Z9181 History of falling: Secondary | ICD-10-CM

## 2011-12-02 DIAGNOSIS — M48061 Spinal stenosis, lumbar region without neurogenic claudication: Secondary | ICD-10-CM

## 2011-12-02 DIAGNOSIS — F341 Dysthymic disorder: Secondary | ICD-10-CM

## 2011-12-02 DIAGNOSIS — I4892 Unspecified atrial flutter: Secondary | ICD-10-CM

## 2011-12-02 DIAGNOSIS — W19XXXA Unspecified fall, initial encounter: Secondary | ICD-10-CM | POA: Insufficient documentation

## 2011-12-02 DIAGNOSIS — I951 Orthostatic hypotension: Secondary | ICD-10-CM

## 2011-12-02 DIAGNOSIS — N390 Urinary tract infection, site not specified: Secondary | ICD-10-CM | POA: Diagnosis present

## 2011-12-02 DIAGNOSIS — K589 Irritable bowel syndrome without diarrhea: Secondary | ICD-10-CM

## 2011-12-02 DIAGNOSIS — R739 Hyperglycemia, unspecified: Secondary | ICD-10-CM

## 2011-12-02 DIAGNOSIS — C50919 Malignant neoplasm of unspecified site of unspecified female breast: Secondary | ICD-10-CM

## 2011-12-02 DIAGNOSIS — M5030 Other cervical disc degeneration, unspecified cervical region: Secondary | ICD-10-CM | POA: Insufficient documentation

## 2011-12-02 DIAGNOSIS — I350 Nonrheumatic aortic (valve) stenosis: Secondary | ICD-10-CM

## 2011-12-02 DIAGNOSIS — E860 Dehydration: Secondary | ICD-10-CM

## 2011-12-02 DIAGNOSIS — N189 Chronic kidney disease, unspecified: Secondary | ICD-10-CM

## 2011-12-02 DIAGNOSIS — M797 Fibromyalgia: Secondary | ICD-10-CM

## 2011-12-02 DIAGNOSIS — E669 Obesity, unspecified: Secondary | ICD-10-CM

## 2011-12-02 DIAGNOSIS — D649 Anemia, unspecified: Secondary | ICD-10-CM

## 2011-12-02 LAB — GLUCOSE, CAPILLARY
Glucose-Capillary: 258 mg/dL — ABNORMAL HIGH (ref 70–99)
Glucose-Capillary: 393 mg/dL — ABNORMAL HIGH (ref 70–99)

## 2011-12-02 LAB — CBC
HCT: 31.8 % — ABNORMAL LOW (ref 36.0–46.0)
MCH: 30.3 pg (ref 26.0–34.0)
MCV: 89.1 fL (ref 78.0–100.0)
Platelets: 172 10*3/uL (ref 150–400)
RDW: 12.9 % (ref 11.5–15.5)
WBC: 9.3 10*3/uL (ref 4.0–10.5)

## 2011-12-02 LAB — URINE MICROSCOPIC-ADD ON

## 2011-12-02 LAB — URINALYSIS, ROUTINE W REFLEX MICROSCOPIC
Bilirubin Urine: NEGATIVE
Glucose, UA: >1000 mg/dL — AB
Ketones, ur: NEGATIVE mg/dL
Nitrite: POSITIVE — AB
Protein, ur: 30 mg/dL — AB
pH: 5.5 (ref 5.0–8.0)

## 2011-12-02 LAB — BASIC METABOLIC PANEL
BUN: 20 mg/dL (ref 6–23)
Calcium: 9.3 mg/dL (ref 8.4–10.5)
Chloride: 99 mEq/L (ref 96–112)
Creatinine, Ser: 1.36 mg/dL — ABNORMAL HIGH (ref 0.50–1.10)
GFR calc Af Amer: 43 mL/min — ABNORMAL LOW (ref >90)

## 2011-12-02 MED ORDER — FENOFIBRATE 160 MG PO TABS
160.0000 mg | ORAL_TABLET | Freq: Every day | ORAL | Status: DC
  Filled 2011-12-02 (×2): qty 1

## 2011-12-02 MED ORDER — HYOSCYAMINE SULFATE 0.125 MG PO TABS: 0.1250 mg | ORAL_TABLET | ORAL | Status: DC | PRN

## 2011-12-02 MED ORDER — TRAMADOL HCL 50 MG PO TABS: 50.0000 mg | ORAL_TABLET | Freq: Four times a day (QID) | ORAL | Status: DC | PRN

## 2011-12-02 MED ORDER — CALCIUM CARBONATE 1250 (500 CA) MG PO TABS
1250.0000 mg | ORAL_TABLET | Freq: Two times a day (BID) | ORAL | Status: DC
  Administered 2011-12-02: 1250 mg via ORAL
  Filled 2011-12-02: qty 2

## 2011-12-02 MED ORDER — ROSUVASTATIN CALCIUM 20 MG PO TABS: 20.0000 mg | ORAL_TABLET | Freq: Every day | ORAL | Status: DC

## 2011-12-02 MED ORDER — DOCUSATE SODIUM 100 MG PO CAPS
100.0000 mg | ORAL_CAPSULE | Freq: Every day | ORAL | Status: DC
  Administered 2011-12-02: 100 mg via ORAL
  Filled 2011-12-02: qty 1

## 2011-12-02 MED ORDER — ASPIRIN EC 81 MG PO TBEC
81.0000 mg | DELAYED_RELEASE_TABLET | Freq: Every day | ORAL | Status: DC
  Administered 2011-12-02: 81 mg via ORAL
  Filled 2011-12-02: qty 1

## 2011-12-02 MED ORDER — ALBUTEROL SULFATE HFA 108 (90 BASE) MCG/ACT IN AERS
2.0000 | INHALATION_SPRAY | Freq: Four times a day (QID) | RESPIRATORY_TRACT | Status: DC | PRN
  Filled 2011-12-02: qty 6.7

## 2011-12-02 MED ORDER — INSULIN ASPART PROT & ASPART (70-30 MIX) 100 UNIT/ML ~~LOC~~ SUSP
15.0000 [IU] | Freq: Two times a day (BID) | SUBCUTANEOUS | Status: DC
  Administered 2011-12-02: 15 [IU] via SUBCUTANEOUS
  Filled 2011-12-02: qty 3

## 2011-12-02 MED ORDER — HEPARIN SODIUM (PORCINE) 5000 UNIT/ML IJ SOLN: 5000.0000 [IU] | Freq: Three times a day (TID) | INTRAMUSCULAR | Status: DC

## 2011-12-02 MED ORDER — PAROXETINE HCL 20 MG PO TABS
40.0000 mg | ORAL_TABLET | ORAL | Status: DC
  Administered 2011-12-02: 40 mg via ORAL
  Filled 2011-12-02: qty 2

## 2011-12-02 MED ORDER — PANTOPRAZOLE SODIUM 40 MG PO TBEC
40.0000 mg | DELAYED_RELEASE_TABLET | Freq: Every day | ORAL | Status: DC
  Administered 2011-12-02: 40 mg via ORAL
  Filled 2011-12-02: qty 1

## 2011-12-02 MED ORDER — INSULIN GLARGINE 100 UNIT/ML ~~LOC~~ SOLN
50.0000 [IU] | Freq: Every day | SUBCUTANEOUS | Status: DC
  Administered 2011-12-02: 50 [IU] via SUBCUTANEOUS
  Filled 2011-12-02: qty 3

## 2011-12-02 MED ORDER — CYCLOBENZAPRINE HCL 10 MG PO TABS: 10.0000 mg | ORAL_TABLET | Freq: Three times a day (TID) | ORAL | Status: DC | PRN

## 2011-12-02 MED ORDER — LISINOPRIL 10 MG PO TABS
20.0000 mg | ORAL_TABLET | Freq: Every day | ORAL | Status: DC
  Administered 2011-12-02: 20 mg via ORAL
  Filled 2011-12-02: qty 2

## 2011-12-02 MED ORDER — MAGNESIUM OXIDE 400 MG PO TABS
400.0000 mg | ORAL_TABLET | Freq: Every day | ORAL | Status: DC
  Administered 2011-12-02: 400 mg via ORAL
  Filled 2011-12-02: qty 1

## 2011-12-02 MED ORDER — LISINOPRIL-HYDROCHLOROTHIAZIDE 20-25 MG PO TABS: 1.0000 | ORAL_TABLET | Freq: Every day | ORAL | Status: DC

## 2011-12-02 MED ORDER — ONDANSETRON HCL 4 MG PO TABS
4.0000 mg | ORAL_TABLET | Freq: Once | ORAL | Status: AC
  Administered 2011-12-02: 4 mg via ORAL
  Filled 2011-12-02: qty 1

## 2011-12-02 MED ORDER — CHLORHEXIDINE GLUCONATE CLOTH 2 % EX PADS: 6.0000 | MEDICATED_PAD | Freq: Every day | CUTANEOUS | Status: DC

## 2011-12-02 MED ORDER — LIDOCAINE HCL (PF) 1 % IJ SOLN
INTRAMUSCULAR | Status: AC
  Filled 2011-12-02: qty 10

## 2011-12-02 MED ORDER — FUROSEMIDE 40 MG PO TABS: 40.0000 mg | ORAL_TABLET | ORAL | Status: DC | PRN

## 2011-12-02 MED ORDER — HYOSCYAMINE 0.15 MG PO TABS: 150.0000 ug | ORAL_TABLET | ORAL | Status: DC | PRN

## 2011-12-02 MED ORDER — ROSUVASTATIN CALCIUM 20 MG PO TABS
40.0000 mg | ORAL_TABLET | Freq: Every day | ORAL | Status: DC
  Administered 2011-12-02: 40 mg via ORAL
  Filled 2011-12-02: qty 2

## 2011-12-02 MED ORDER — METOPROLOL TARTRATE 50 MG PO TABS
50.0000 mg | ORAL_TABLET | Freq: Two times a day (BID) | ORAL | Status: DC
  Administered 2011-12-02: 50 mg via ORAL
  Filled 2011-12-02: qty 1

## 2011-12-02 MED ORDER — VITAMIN D3 25 MCG (1000 UNIT) PO TABS
1000.0000 [IU] | ORAL_TABLET | Freq: Every day | ORAL | Status: DC
Start: 2011-12-02 — End: 2011-12-02
  Filled 2011-12-02 (×2): qty 1

## 2011-12-02 MED ORDER — MUPIROCIN 2 % EX OINT: 1.0000 "application " | TOPICAL_OINTMENT | Freq: Two times a day (BID) | CUTANEOUS | Status: DC

## 2011-12-02 MED ORDER — LOPERAMIDE HCL 2 MG PO CAPS: 2.0000 mg | ORAL_CAPSULE | Freq: Four times a day (QID) | ORAL | Status: DC | PRN

## 2011-12-02 MED ORDER — HYDROCHLOROTHIAZIDE 25 MG PO TABS
25.0000 mg | ORAL_TABLET | Freq: Every day | ORAL | Status: DC
  Filled 2011-12-02: qty 1

## 2011-12-02 MED ORDER — HYDROCODONE-ACETAMINOPHEN 10-325 MG PO TABS
1.0000 | ORAL_TABLET | ORAL | Status: DC | PRN
  Administered 2011-12-02: 1 via ORAL
  Filled 2011-12-02: qty 1

## 2011-12-02 MED ORDER — DEXTROSE 5 % IV SOLN
INTRAVENOUS | Status: AC
  Filled 2011-12-02: qty 10

## 2011-12-02 MED ORDER — INSULIN ASPART 100 UNIT/ML ~~LOC~~ SOLN
12.0000 [IU] | Freq: Three times a day (TID) | SUBCUTANEOUS | Status: DC
  Filled 2011-12-02: qty 3

## 2011-12-02 MED ORDER — MORPHINE SULFATE 4 MG/ML IJ SOLN
4.0000 mg | Freq: Once | INTRAMUSCULAR | Status: AC
  Administered 2011-12-02: 4 mg via INTRAVENOUS
  Filled 2011-12-02: qty 1

## 2011-12-02 MED ORDER — DIAZEPAM 5 MG PO TABS: 10.0000 mg | ORAL_TABLET | Freq: Four times a day (QID) | ORAL | Status: DC | PRN

## 2011-12-02 MED ORDER — HYDROCODONE-ACETAMINOPHEN 5-325 MG PO TABS
2.0000 | ORAL_TABLET | Freq: Once | ORAL | Status: AC
  Administered 2011-12-02: 2 via ORAL
  Filled 2011-12-02: qty 2

## 2011-12-02 MED ORDER — DEXTROSE 5 % IV SOLN
1.0000 g | INTRAVENOUS | Status: DC
  Administered 2011-12-02: 1 g via INTRAVENOUS
  Filled 2011-12-02: qty 10

## 2011-12-02 MED ORDER — PREGABALIN 25 MG PO CAPS
50.0000 mg | ORAL_CAPSULE | Freq: Three times a day (TID) | ORAL | Status: DC
  Administered 2011-12-02: 50 mg via ORAL
  Filled 2011-12-02: qty 2

## 2011-12-02 NOTE — Discharge Summary (Signed)
Physician Discharge Summary  Patient ID: [name] MRN: [id number] DOB/AGE: 04/08/1933 77 y.o. Primary Care Physician:MCGOUGH,WILLIAM M, MD Admit date: 12/02/2011 Discharge date: 12/02/2011    Discharge Diagnoses:  1. Accidental fall with no serious sequelae except for right temporal laceration, sutured. 2. Uncontrolled diabetes mellitus, no evidence of diabetic ketoacidosis or hyperosmolar coma. 3. Possible urinary tract infection.   Current Discharge Medication List    CONTINUE these medications which have NOT CHANGED   Details  albuterol (PROAIR HFA) 108 (90 BASE) MCG/ACT inhaler Inhale 2 puffs into the lungs every 6 (six) hours as needed.      Artificial Tear Solution (TEARS NATURALE II) SOLN Apply to eye as needed.      aspirin (ASPIR-LOW) 81 MG EC tablet Take 81 mg by mouth daily.      !! CRESTOR 40 MG tablet TAKE (1) TABLET BY MOUTH AT BEDTIME. Qty: 30 each, Refills: 10    cyclobenzaprine (FLEXERIL) 10 MG tablet Take 10 mg by mouth 3 (three) times daily as needed.     diazepam (VALIUM) 10 MG tablet Take 10 mg by mouth every 6 (six) hours as needed.      docusate sodium (COLACE) 100 MG capsule Take 100 mg by mouth daily.      esomeprazole (NEXIUM) 40 MG capsule Take 40 mg by mouth daily before breakfast.      fenofibrate 160 MG tablet TAKE 1 TABLET BY MOUTH ONCE A DAY WITH MEAL. Qty: 30 tablet, Refills: 9    furosemide (LASIX) 40 MG tablet Take 40 mg by mouth as needed.     hyoscyamine (CYSTOSPAZ) 0.15 MG tablet Take 0.15 mg by mouth every 4 (four) hours as needed.      Insulin Glargine (LANTUS [place]) Inject 50 Units into the skin every morning.     insulin NPH-insulin regular (NOVOLIN 70/30) (70-30) 100 UNIT/ML injection Inject 12 Units into the skin 3 (three) times daily. Sliding scale    lisinopril-hydrochlorothiazide (PRINZIDE,ZESTORETIC) 20-25 MG per tablet Take 1 tablet by mouth daily. Dose increase Qty: 30 tablet, Refills: 11    loperamide (IMODIUM) 2 MG  capsule Take 2 mg by mouth 4 (four) times daily as needed.      magnesium oxide (MAG-OX) 400 MG tablet Take 400 mg by mouth daily.      metoprolol (LOPRESSOR) 50 MG tablet Take 50 mg by mouth 2 (two) times daily.      Multiple Vitamin (DAILY VITES PO) Take 1 tablet by mouth daily.      NOVOLOG FLEXPEN 100 UNIT/ML injection     PARoxetine (PAXIL) 40 MG tablet Take 40 mg by mouth every morning.      pregabalin (LYRICA) 50 MG capsule Take 50 mg by mouth 3 (three) times daily.      !! Rosuvastatin Calcium (CRESTOR PO) Take 40 mg by mouth daily.     traMADol (ULTRAM) 50 MG tablet Take 50 mg by mouth every 6 (six) hours as needed.       !! - Potential duplicate medications found. Please discuss with provider.    STOP taking these medications     HYDROcodone-acetaminophen (LORCET) 10-650 MG per tablet      POTASSIUM PO         Discharged Condition: Stable.    Consults: None.  Significant Diagnostic Studies: Dg Lumbar Spine Complete  12/02/2011  *RADIOLOGY REPORT*  Clinical Data: Pain after falling on 02/05 place.  LUMBAR SPINE - COMPLETE 4+ VIEW  Comparison: 02/04/2010  Findings: Postoperative changes  in the lower lumbar spine with posterior rod and pedicular screw fixation from L3-L5. Intervertebral disc prosthesis at L3-4 and L4-5.  Fused segments appear stable in orientation without abnormal alignment. Degenerative changes with disc space narrowing and vacuum change as well as hypertrophic changes at L2-3.  Mild anterior compression of the superior endplate of L1.  The appearance is stable since the previous study.  No acute changes are visualized.  Vascular calcifications.  IMPRESSION: Old postoperative changes and mild old compression deformity L1. No acute changes suggested.  Original Report Authenticated By: Neale Burly, M.D.   Dg Pelvis 1-2 Views  12/02/2011  *RADIOLOGY REPORT*  Clinical Data: Pain after fall  PELVIS - 1-2 VIEW  Comparison: 02/04/2010  Findings: Old  fracture deformity of the left pubic symphysis.  The pelvis otherwise appears intact.  No focal bone lesion or acute fracture demonstrated.  Sacral struts appear symmetrical.  The SI joints and symphysis pubis remain intact.  Degenerative changes in the hips.  Stable appearance since previous study.  IMPRESSION: Old fracture deformities.  Degenerative changes.  No acute change since previous study.  Original Report Authenticated By: Neale Burly, M.D.   Ct Head Wo Contrast  12/02/2011  *RADIOLOGY REPORT*  Clinical Data:  The patient fell.  Head laceration.  Bilateral neck pain and stiffness.  CT HEAD WITHOUT CONTRAST CT CERVICAL SPINE WITHOUT CONTRAST  Technique:  Multidetector CT imaging of the head and cervical spine was performed following the standard protocol without intravenous contrast.  Multiplanar CT image reconstructions of the cervical spine were also generated.  Comparison:  02/04/2010 [facility]  CT HEAD  Findings: Diffuse cerebral atrophy.  Mild ventricular dilatation consistent with central atrophy.  No mass effect or midline shift. No abnormal extra-axial fluid collections.  Gray-white matter junctions are distinct.  Basal cisterns are not effaced.  No evidence of acute intracranial hemorrhage.  Prominence of the sella with low attenuation suggesting empty sella configuration. Vascular calcifications in the intracranial arteries.  Mild mucosal membrane thickening in the maxillary antra.  No acute air-fluid levels in the sinuses.  No depressed skull fractures.  No significant changes since the previous study.  IMPRESSION: Chronic atrophy and small vessel ischemic changes.  No evidence of acute intracranial hemorrhage, mass lesion, or acute infarct.  CT CERVICAL SPINE  Findings: There is mild anterior subluxation of C4 on C5 with slight widening of the C4-5 interspace.  This appearance is stable since the previous study.  Degenerative changes are present at this level in the  endplates and facet joints, suggesting this is likely a degenerative process.  Ligamentous injury is not entirely excluded.  Slight anterior subluxation also demonstrated at C5-6 which is stable.  Again, this is likely due to degenerative changes but ligamentous injury is not excluded.  Fragmentation of the anterior inferior endplate of C4 is stable and likely represents limbus vertebrae.  No vertebral compression deformities.  No prevertebral soft tissue swelling.  Lateral masses of C1 appear symmetrical.  The odontoid process is intact.  Degenerative changes throughout the facet joints.  Mild curvature of the cervical spine convex towards the left.  This is likely positional but muscle spasm or torticollis can also have this appearance.  Bone cortex and trabecular architecture appear intact.  No paraspinal soft tissue infiltration.  IMPRESSION:  Degenerative changes throughout the cervical spine.  Mild anterior subluxation of C4 on C5 and C5 on C6, stable since the previous study, and therefore likely due to degenerative changes. Underlying  ligamentous injury is not entirely excluded and clinical correlation is recommended.  No displaced fractures identified.  Original Report Authenticated By: Neale Burly, M.D.   Ct Cervical Spine Wo Contrast  12/02/2011  *RADIOLOGY REPORT*  Clinical Data:  The patient fell.  Head laceration.  Bilateral neck pain and stiffness.  CT HEAD WITHOUT CONTRAST CT CERVICAL SPINE WITHOUT CONTRAST  Technique:  Multidetector CT imaging of the head and cervical spine was performed following the standard protocol without intravenous contrast.  Multiplanar CT image reconstructions of the cervical spine were also generated.  Comparison:  02/04/2010 [facility]  CT HEAD  Findings: Diffuse cerebral atrophy.  Mild ventricular dilatation consistent with central atrophy.  No mass effect or midline shift. No abnormal extra-axial fluid collections.  Gray-white matter junctions are  distinct.  Basal cisterns are not effaced.  No evidence of acute intracranial hemorrhage.  Prominence of the sella with low attenuation suggesting empty sella configuration. Vascular calcifications in the intracranial arteries.  Mild mucosal membrane thickening in the maxillary antra.  No acute air-fluid levels in the sinuses.  No depressed skull fractures.  No significant changes since the previous study.  IMPRESSION: Chronic atrophy and small vessel ischemic changes.  No evidence of acute intracranial hemorrhage, mass lesion, or acute infarct.  CT CERVICAL SPINE  Findings: There is mild anterior subluxation of C4 on C5 with slight widening of the C4-5 interspace.  This appearance is stable since the previous study.  Degenerative changes are present at this level in the endplates and facet joints, suggesting this is likely a degenerative process.  Ligamentous injury is not entirely excluded.  Slight anterior subluxation also demonstrated at C5-6 which is stable.  Again, this is likely due to degenerative changes but ligamentous injury is not excluded.  Fragmentation of the anterior inferior endplate of C4 is stable and likely represents limbus vertebrae.  No vertebral compression deformities.  No prevertebral soft tissue swelling.  Lateral masses of C1 appear symmetrical.  The odontoid process is intact.  Degenerative changes throughout the facet joints.  Mild curvature of the cervical spine convex towards the left.  This is likely positional but muscle spasm or torticollis can also have this appearance.  Bone cortex and trabecular architecture appear intact.  No paraspinal soft tissue infiltration.  IMPRESSION:  Degenerative changes throughout the cervical spine.  Mild anterior subluxation of C4 on C5 and C5 on C6, stable since the previous study, and therefore likely due to degenerative changes. Underlying ligamentous injury is not entirely excluded and clinical correlation is recommended.  No displaced fractures  identified.  Original Report Authenticated By: Neale Burly, M.D.    Lab Results: Basic Metabolic Panel:  [facility] 12/02/11 0204  NA 132*  K 5.3*  CL 99  CO2 25  GLUCOSE 339*  BUN 20  CREATININE 1.36*  CALCIUM 9.3  MG --  PHOS --       CBC:  Basename 12/02/11 0204  WBC 9.3  NEUTROABS --  HGB 10.8*  HCT 31.8*  MCV 89.1  PLT 172    Recent Results (from the past 240 hour(s))  MRSA PCR SCREENING     Status: Abnormal   Collection Time   12/02/11  9:00 AM      Component Value Range Status Comment   MRSA by PCR POSITIVE (*) NEGATIVE  Final      Hospital Course: This 77 year old lady was admitted today with an accidental fall. Please see initial history and physical examination. There is been  no neurological or musculoskeletal see her sequelae of this except for a right temporal 1.5 cm laceration which has been sutured. She never had loss of consciousness with the fall or any significant head injury. She was found to be mildly hyperkalemic and urinalysis was suggestive of a possible urinary tract infection. She has been given intravenous ceftriaxone. The nursing staff tell me that she has walked in the hallway by herself and feels medically well.  Discharge Exam: Blood pressure 103/84, pulse 75, temperature 99 F (37.2 C), temperature source Oral, resp. rate 20, height $RemoveBe'5\' 6"'UifvxXYqj$  (1.676 m), weight 110.678 kg (244 lb), SpO2 94.00%. She looks systemically well. Heart sounds are present and normal. Lung fields are clear. She is alert and orientated without any focal signs. Abdomen is soft and nontender.  Disposition: Home. She will need followup with her primary care physician in regard to the possible uric tract infection. I suggested repeat urine culture be taken. Also she will need to have sutures removed in the next 7 days or so.  Discharge Orders    Future Appointments: Provider: Department: Dept Phone: Center:   12/25/2011 10:50 AM Merrie Roof, MD [facility]  [phone] None   12/26/2011 2:30 PM [place] [phone] None   05/14/2012 1:30 PM Robynn Pane, [place] [phone] None     Future Orders Please Complete By Expires   Diet - low sodium heart healthy      Increase activity slowly         Follow-up Information    Follow up with [facility] .         SignedDoree Albee Pager [phone]  12/02/2011, 11:05 AM

## 2011-12-02 NOTE — H&P (Signed)
[name] MRN: [id number] DOB/AGE: [birth year] 75 y.o. Primary Care Physician:MCGOUGH,WILLIAM M, MD Admit date: 12/02/2011 Chief Complaint: Accidental fall, UTI. HPI: This 77 year old lady yesterday evening had an accidental fall in her fireplace. She fell and hit the right side of her head sustaining a laceration. There was absolutely no loss of consciousness. She feels well now except for some discomfort. There is no chest pain, palpitations or dyspnea. She does describe difficulty in micturition. There's been no fever.  Past Medical History  Diagnosis Date  . Breast carcinoma   . Anemia   . IBS (irritable bowel syndrome)     diverticulosis, gastroesophageal reflux disease  . Hypertension   . Asthma   . Diabetes mellitus   . Hyperlipidemia   . Arthritis   . Headaches, cluster   . Lower back pain   . Aortic stenosis     Mild  . Chronic kidney disease     Creatinine-1.5 in 2010 and 2.5-3 in 2011; 1.5-10/2010;  Klebsiella UTI-10/2010. urine protein 36 mg/dl, mildly elevated  . Depression   . Fibromyalgia   . Cerebrovascular disease     with a 62% LICA; repeat study in 10/2009-no obstructive disease; modest ASVD  . Falls infrequently 01/2010    fracture of pelvis and left humerus  . Atrial flutter 2002  . DJD (degenerative joint disease) 11/14/2011  . Obesity 11/14/2011  . Diabetic retinopathy    Past Surgical History  Procedure Date  . Mastectomy     Right breast for carcinoma  . Lumbar disc surgery     lumbosacral spine procedure x 2  . Appendectomy   . Cholecystectomy   . Cataract extraction, bilateral     Lens implants  . Central venous catheter tunneled insertion single lumen   . Colonoscopy 2012  . Eye surgery nov 2012    for diabetic retinopathy        Family History  Problem Relation Age of Onset  . Alzheimer's disease Mother   . Heart attack Father   . Aneurysm Sister   . Coronary artery disease Brother     Social History:  reports that she has  quit smoking. She does not have any smokeless tobacco history on file. She reports that she does not drink alcohol. Her drug history not on file.   Allergies:  Allergies  Allergen Reactions  . Imodium (Loperamide Hcl)   . Mold Extract (Trichophyton Mentagrophyte)     Mildew, dust  . Niaspan (Niacin)     Medications Prior to Admission  Medication Dose Route Frequency Provider Last Rate Last Dose  . albuterol (PROVENTIL HFA;VENTOLIN HFA) 108 (90 BASE) MCG/ACT inhaler 2 puff  2 puff Inhalation Q6H PRN Nimish C Gosrani      . aspirin EC tablet 81 mg  81 mg Oral Daily Nimish C Gosrani      . calcium carbonate capsule 1,250 mg  1,250 mg Oral BID WC Nimish C Gosrani      . cefTRIAXone (ROCEPHIN) 1 g in dextrose 5 % 50 mL IVPB  1 g Intravenous Q24H Hoy Morn, MD   1 g at 12/02/11 0411  . cholecalciferol (VITAMIN D) tablet 1,000 Units  1,000 Units Oral Daily Nimish C Gosrani      . cyclobenzaprine (FLEXERIL) tablet 10 mg  10 mg Oral TID PRN Nimish C Gosrani      . diazepam (VALIUM) tablet 10 mg  10 mg Oral Q6H PRN Nimish C Gosrani      . docusate  sodium (COLACE) capsule 100 mg  100 mg Oral Daily Nimish C Gosrani      . fenofibrate tablet 160 mg  160 mg Oral Daily Nimish C Gosrani      . furosemide (LASIX) tablet 40 mg  40 mg Oral PRN Nimish C Gosrani      . heparin injection 5,000 Units  5,000 Units Subcutaneous Q8H Nimish C Gosrani      . HYDROcodone-acetaminophen (NORCO) 10-325 MG per tablet 1 tablet  1 tablet Oral Q4H PRN Nimish C Gosrani      . HYDROcodone-acetaminophen (NORCO) 5-325 MG per tablet 2 tablet  2 tablet Oral Once Lenox Ahr, [place]   2 tablet at 12/02/11 0052  . hyoscyamine (CYSTOSPAZ) tablet 150 mcg  150 mcg Oral Q4H PRN Nimish C Gosrani      . insulin aspart (novoLOG) injection 12 Units  12 Units Subcutaneous TID WC Nimish C Gosrani      . insulin aspart protamine-insulin aspart (NOVOLOG 70/30) injection 15 Units  15 Units Subcutaneous BID WC Nimish C Gosrani      .  insulin glargine (LANTUS) injection 50 Units  50 Units Subcutaneous Daily Nimish C Gosrani      . lidocaine (XYLOCAINE) 1 % injection           . lisinopril-hydrochlorothiazide (PRINZIDE,ZESTORETIC) 20-25 MG per tablet 1 tablet  1 tablet Oral Daily Nimish C Gosrani      . loperamide (IMODIUM) capsule 2 mg  2 mg Oral QID PRN Nimish C Gosrani      . magnesium oxide (MAG-OX) tablet 400 mg  400 mg Oral Daily Nimish C Gosrani      . metoprolol (LOPRESSOR) tablet 50 mg  50 mg Oral BID Nimish C Gosrani      . morphine 4 MG/ML injection 4 mg  4 mg Intravenous Once Hoy Morn, MD   4 mg at 12/02/11 1950  . ondansetron (ZOFRAN) tablet 4 mg  4 mg Oral Once Lenox Ahr, PA   4 mg at 12/02/11 0054  . pantoprazole (PROTONIX) EC tablet 40 mg  40 mg Oral Daily Nimish C Gosrani      . PARoxetine (PAXIL) tablet 40 mg  40 mg Oral Q0700 Nimish C Gosrani      . pregabalin (LYRICA) capsule 50 mg  50 mg Oral TID Nimish C Gosrani      . rosuvastatin (CRESTOR) tablet 20 mg  20 mg Oral Daily Nimish C Gosrani      . rosuvastatin (CRESTOR) tablet 40 mg  40 mg Oral Daily Nimish C Gosrani      . traMADol (ULTRAM) tablet 50 mg  50 mg Oral Q6H PRN Nimish C Gosrani       Medications Prior to Admission  Medication Sig Dispense Refill  . albuterol (PROAIR HFA) 108 (90 BASE) MCG/ACT inhaler Inhale 2 puffs into the lungs every 6 (six) hours as needed.        . Artificial Tear Solution (TEARS NATURALE II) SOLN Apply to eye as needed.        Marland Kitchen aspirin (ASPIR-LOW) 81 MG EC tablet Take 81 mg by mouth daily.        . calcium carbonate 1250 MG capsule Take 1,250 mg by mouth 2 (two) times daily with a meal.        . cholecalciferol (VITAMIN D) 1000 UNITS tablet Take 1,000 Units by mouth daily.        . CRESTOR 40 MG tablet TAKE (1) TABLET  BY MOUTH AT BEDTIME.  30 each  10  . cyclobenzaprine (FLEXERIL) 10 MG tablet Take 10 mg by mouth 3 (three) times daily as needed.       . diazepam (VALIUM) 10 MG tablet Take 10 mg by mouth  every 6 (six) hours as needed.        . docusate sodium (COLACE) 100 MG capsule Take 100 mg by mouth daily.        Marland Kitchen esomeprazole (NEXIUM) 40 MG capsule Take 40 mg by mouth daily before breakfast.        . fenofibrate 160 MG tablet TAKE 1 TABLET BY MOUTH ONCE A DAY WITH MEAL.  30 tablet  9  . furosemide (LASIX) 40 MG tablet Take 40 mg by mouth as needed.       Marland Kitchen HYDROcodone-acetaminophen (LORCET) 10-650 MG per tablet Take 1 tablet by mouth every morning. .5 pill every AM       . hyoscyamine (CYSTOSPAZ) 0.15 MG tablet Take 0.15 mg by mouth every 4 (four) hours as needed.        . Insulin Glargine (LANTUS [place]) Inject 50 Units into the skin every morning.       . insulin NPH-insulin regular (NOVOLIN 70/30) (70-30) 100 UNIT/ML injection Inject 12 Units into the skin 3 (three) times daily. Sliding scale      . lisinopril-hydrochlorothiazide (PRINZIDE,ZESTORETIC) 20-25 MG per tablet Take 1 tablet by mouth daily. Dose increase  30 tablet  11  . loperamide (IMODIUM) 2 MG capsule Take 2 mg by mouth 4 (four) times daily as needed.        . magnesium oxide (MAG-OX) 400 MG tablet Take 400 mg by mouth daily.        . metoprolol (LOPRESSOR) 50 MG tablet Take 50 mg by mouth 2 (two) times daily.        . Multiple Vitamin (DAILY VITES PO) Take 1 tablet by mouth daily.        Marland Kitchen NOVOLOG FLEXPEN 100 UNIT/ML injection       . PARoxetine (PAXIL) 40 MG tablet Take 40 mg by mouth every morning.        Marland Kitchen POTASSIUM PO Take 20 mEq by mouth 2 (two) times daily.       . pregabalin (LYRICA) 50 MG capsule Take 50 mg by mouth 3 (three) times daily.        . Rosuvastatin Calcium (CRESTOR PO) Take 40 mg by mouth daily.       . traMADol (ULTRAM) 50 MG tablet Take 50 mg by mouth every 6 (six) hours as needed.             XQJ:JHERD from the symptoms mentioned above,there are no other symptoms referable to all systems reviewed.  Physical Exam: Blood pressure 103/84, pulse 68, temperature 99 F (37.2 C), temperature source  Oral, resp. rate 20, height 5\' 6"  (1.676 m), weight 110.678 kg (244 lb), SpO2 96.00%. She looks systemically well. She is alert and orientated. There are no focal neurological signs whatsoever. Heart sounds are present and normal. Lung fields clear. Abdomen is soft and nontender.    Basename 12/02/11 0204  WBC 9.3  NEUTROABS --  HGB 10.8*  HCT 31.8*  MCV 89.1  PLT 172    Basename 12/02/11 0204  NA 132*  K 5.3*  CL 99  CO2 25  GLUCOSE 339*  BUN 20  CREATININE 1.36*  CALCIUM 9.3  MG --         Dg  Lumbar Spine Complete  12/02/2011  *RADIOLOGY REPORT*  Clinical Data: Pain after falling on 02/05 place.  LUMBAR SPINE - COMPLETE 4+ VIEW  Comparison: 02/04/2010  Findings: Postoperative changes in the lower lumbar spine with posterior rod and pedicular screw fixation from L3-L5. Intervertebral disc prosthesis at L3-4 and L4-5.  Fused segments appear stable in orientation without abnormal alignment. Degenerative changes with disc space narrowing and vacuum change as well as hypertrophic changes at L2-3.  Mild anterior compression of the superior endplate of L1.  The appearance is stable since the previous study.  No acute changes are visualized.  Vascular calcifications.  IMPRESSION: Old postoperative changes and mild old compression deformity L1. No acute changes suggested.  Original Report Authenticated By: Neale Burly, M.D.   Dg Pelvis 1-2 Views  12/02/2011  *RADIOLOGY REPORT*  Clinical Data: Pain after fall  PELVIS - 1-2 VIEW  Comparison: 02/04/2010  Findings: Old fracture deformity of the left pubic symphysis.  The pelvis otherwise appears intact.  No focal bone lesion or acute fracture demonstrated.  Sacral struts appear symmetrical.  The SI joints and symphysis pubis remain intact.  Degenerative changes in the hips.  Stable appearance since previous study.  IMPRESSION: Old fracture deformities.  Degenerative changes.  No acute change since previous study.  Original Report  Authenticated By: Neale Burly, M.D.   Ct Head Wo Contrast  12/02/2011  *RADIOLOGY REPORT*  Clinical Data:  The patient fell.  Head laceration.  Bilateral neck pain and stiffness.  CT HEAD WITHOUT CONTRAST CT CERVICAL SPINE WITHOUT CONTRAST  Technique:  Multidetector CT imaging of the head and cervical spine was performed following the standard protocol without intravenous contrast.  Multiplanar CT image reconstructions of the cervical spine were also generated.  Comparison:  02/04/2010 [facility]  CT HEAD  Findings: Diffuse cerebral atrophy.  Mild ventricular dilatation consistent with central atrophy.  No mass effect or midline shift. No abnormal extra-axial fluid collections.  Gray-white matter junctions are distinct.  Basal cisterns are not effaced.  No evidence of acute intracranial hemorrhage.  Prominence of the sella with low attenuation suggesting empty sella configuration. Vascular calcifications in the intracranial arteries.  Mild mucosal membrane thickening in the maxillary antra.  No acute air-fluid levels in the sinuses.  No depressed skull fractures.  No significant changes since the previous study.  IMPRESSION: Chronic atrophy and small vessel ischemic changes.  No evidence of acute intracranial hemorrhage, mass lesion, or acute infarct.  CT CERVICAL SPINE  Findings: There is mild anterior subluxation of C4 on C5 with slight widening of the C4-5 interspace.  This appearance is stable since the previous study.  Degenerative changes are present at this level in the endplates and facet joints, suggesting this is likely a degenerative process.  Ligamentous injury is not entirely excluded.  Slight anterior subluxation also demonstrated at C5-6 which is stable.  Again, this is likely due to degenerative changes but ligamentous injury is not excluded.  Fragmentation of the anterior inferior endplate of C4 is stable and likely represents limbus vertebrae.  No vertebral compression  deformities.  No prevertebral soft tissue swelling.  Lateral masses of C1 appear symmetrical.  The odontoid process is intact.  Degenerative changes throughout the facet joints.  Mild curvature of the cervical spine convex towards the left.  This is likely positional but muscle spasm or torticollis can also have this appearance.  Bone cortex and trabecular architecture appear intact.  No paraspinal soft tissue infiltration.  IMPRESSION:  Degenerative changes throughout the cervical spine.  Mild anterior subluxation of C4 on C5 and C5 on C6, stable since the previous study, and therefore likely due to degenerative changes. Underlying ligamentous injury is not entirely excluded and clinical correlation is recommended.  No displaced fractures identified.  Original Report Authenticated By: Neale Burly, M.D.   Ct Cervical Spine Wo Contrast  12/02/2011  *RADIOLOGY REPORT*  Clinical Data:  The patient fell.  Head laceration.  Bilateral neck pain and stiffness.  CT HEAD WITHOUT CONTRAST CT CERVICAL SPINE WITHOUT CONTRAST  Technique:  Multidetector CT imaging of the head and cervical spine was performed following the standard protocol without intravenous contrast.  Multiplanar CT image reconstructions of the cervical spine were also generated.  Comparison:  02/04/2010 [facility]  CT HEAD  Findings: Diffuse cerebral atrophy.  Mild ventricular dilatation consistent with central atrophy.  No mass effect or midline shift. No abnormal extra-axial fluid collections.  Gray-white matter junctions are distinct.  Basal cisterns are not effaced.  No evidence of acute intracranial hemorrhage.  Prominence of the sella with low attenuation suggesting empty sella configuration. Vascular calcifications in the intracranial arteries.  Mild mucosal membrane thickening in the maxillary antra.  No acute air-fluid levels in the sinuses.  No depressed skull fractures.  No significant changes since the previous study.   IMPRESSION: Chronic atrophy and small vessel ischemic changes.  No evidence of acute intracranial hemorrhage, mass lesion, or acute infarct.  CT CERVICAL SPINE  Findings: There is mild anterior subluxation of C4 on C5 with slight widening of the C4-5 interspace.  This appearance is stable since the previous study.  Degenerative changes are present at this level in the endplates and facet joints, suggesting this is likely a degenerative process.  Ligamentous injury is not entirely excluded.  Slight anterior subluxation also demonstrated at C5-6 which is stable.  Again, this is likely due to degenerative changes but ligamentous injury is not excluded.  Fragmentation of the anterior inferior endplate of C4 is stable and likely represents limbus vertebrae.  No vertebral compression deformities.  No prevertebral soft tissue swelling.  Lateral masses of C1 appear symmetrical.  The odontoid process is intact.  Degenerative changes throughout the facet joints.  Mild curvature of the cervical spine convex towards the left.  This is likely positional but muscle spasm or torticollis can also have this appearance.  Bone cortex and trabecular architecture appear intact.  No paraspinal soft tissue infiltration.  IMPRESSION:  Degenerative changes throughout the cervical spine.  Mild anterior subluxation of C4 on C5 and C5 on C6, stable since the previous study, and therefore likely due to degenerative changes. Underlying ligamentous injury is not entirely excluded and clinical correlation is recommended.  No displaced fractures identified.  Original Report Authenticated By: Neale Burly, M.D.   Impression:  1. Accidental fall. No serious sequelae. 2. UTI on urinalysis. 3. Uncontrolled diabetes. 4. Hypertension. 5. Obesity.     Plan: 1. Admit for observation. 2. Encouraged to walk/mobilize. 3. Discharge home soon. Diabetes and UTI can be treated as an outpatient.      Doree Albee Pager  [phone]  12/02/2011, 9:01 AM

## 2011-12-02 NOTE — Progress Notes (Signed)
Pt to be discharged home per MD order. All discharge instructions and medications gone over with patient. Instructions given on how to care for sutures. All questions and concerns answered. Pt discharged via wheelchair. Pt's grandson here to pick patient up.

## 2011-12-02 NOTE — ED Provider Notes (Signed)
History     CSN: [id number]  Arrival date & time 12/02/11  0002   None     Chief Complaint  Patient presents with  . Head Laceration  . Fall    Hx per Lily Kocher PA HPI Comments: Patient reportsshe has diabetic neuropathies and advanced arthritis. As a result she has frequent falls. Tonight she was wearing bedroom shoes, states that when she stood to turn off the fan from her fireplace she slipped and fell. Family was in the home and she states she was not in the for very long. She denies loss of consciousness. She states she is on several medications, but is not on any blood thinning medications. She requests evaluation and treatment of injuries sustained during the fall. The patient complains of laceration to the for head and to the right side of the face. Back pain and pelvis pain.  Patient is a 77 y.o. female presenting with scalp laceration and fall. The history is provided by the patient.  Head Laceration Associated symptoms include arthralgias and fatigue. Pertinent negatives include no abdominal pain, chest pain, coughing or neck pain.  Fall Pertinent negatives include no abdominal pain and no hematuria.   Hx per Jola Schmidt MD Is reported that the patient's had increasing falls over the past several weeks.  The patient reports lightheadedness and dizziness when she stands with occasional this equilibrium.  She reports this evening his fall was when she was leaning forward to turn off the fireplace at which point she struck the right side of her head.  She had no loss consciousness.  She reports no vomiting.  She denies chest pain no abdominal pain.  She denies dysuria or urinary frequency.  She denies fevers or chills or flank pain.  Family is concerned because the patient lives at home alone with her increasing falls she is at significant risk.  They report that she mainly uses a wheelchair that was her husbands to get around given her instability on her feet.  The patient  reports she has not seen her primary care doctor in several years and family states this is so because she is concerned that she'll be placed in a nursing home  Past Medical History  Diagnosis Date  . Breast carcinoma   . Anemia   . IBS (irritable bowel syndrome)     diverticulosis, gastroesophageal reflux disease  . Hypertension   . Asthma   . Diabetes mellitus   . Hyperlipidemia   . Arthritis   . Headaches, cluster   . Lower back pain   . Aortic stenosis     Mild  . Chronic kidney disease     Creatinine-1.5 in 2010 and 2.5-3 in 2011; 1.5-10/2010;  Klebsiella UTI-10/2010. urine protein 36 mg/dl, mildly elevated  . Depression   . Fibromyalgia   . Cerebrovascular disease     with a 66% LICA; repeat study in 10/2009-no obstructive disease; modest ASVD  . Falls infrequently 01/2010    fracture of pelvis and left humerus  . Atrial flutter 2002  . DJD (degenerative joint disease) 11/14/2011  . Obesity 11/14/2011  . Diabetic retinopathy     Past Surgical History  Procedure Date  . Mastectomy     Right breast for carcinoma  . Lumbar disc surgery     lumbosacral spine procedure x 2  . Appendectomy   . Cholecystectomy   . Cataract extraction, bilateral     Lens implants  . Central venous catheter tunneled insertion single lumen   .  Colonoscopy 2012  . Eye surgery nov 2012    for diabetic retinopathy    Family History  Problem Relation Age of Onset  . Alzheimer's disease Mother   . Heart attack Father   . Aneurysm Sister   . Coronary artery disease Brother     History  Substance Use Topics  . Smoking status: Former Research scientist (life sciences)  . Smokeless tobacco: Not on file  . Alcohol Use: No    OB History    Grav Para Term Preterm Abortions TAB SAB Ect Mult Living                  Review of Systems  Constitutional: Positive for fatigue. Negative for activity change.       All ROS Neg except as noted in HPI  HENT: Negative for nosebleeds and neck pain.   Eyes: Negative for  photophobia and discharge.  Respiratory: Negative for cough, shortness of breath and wheezing.   Cardiovascular: Negative for chest pain and palpitations.  Gastrointestinal: Negative for abdominal pain and blood in stool.  Genitourinary: Negative for dysuria, frequency and hematuria.  Musculoskeletal: Positive for back pain, arthralgias and gait problem.  Skin: Negative.   Neurological: Positive for light-headedness. Negative for dizziness, seizures and speech difficulty.  Psychiatric/Behavioral: Negative for hallucinations and confusion.  All other systems reviewed and are negative.    Allergies  Imodium; Mold extract; and Niaspan  Home Medications   Current Outpatient Rx  Name Route Sig Dispense Refill  . ALBUTEROL SULFATE HFA 108 (90 BASE) MCG/ACT IN AERS Inhalation Inhale 2 puffs into the lungs every 6 (six) hours as needed.      Flossie Dibble II OP SOLN Ophthalmic Apply to eye as needed.      . ASPIRIN 81 MG PO TBEC Oral Take 81 mg by mouth daily.      Marland Kitchen CALCIUM CARBONATE 1250 MG PO CAPS Oral Take 1,250 mg by mouth 2 (two) times daily with a meal.      . VITAMIN D 1000 UNITS PO TABS Oral Take 1,000 Units by mouth daily.      . CRESTOR 40 MG PO TABS  TAKE (1) TABLET BY MOUTH AT BEDTIME. 30 each 10  . CYCLOBENZAPRINE HCL 10 MG PO TABS Oral Take 10 mg by mouth 3 (three) times daily as needed.     Marland Kitchen DIAZEPAM 10 MG PO TABS Oral Take 10 mg by mouth every 6 (six) hours as needed.      Marland Kitchen DOCUSATE SODIUM 100 MG PO CAPS Oral Take 100 mg by mouth daily.      Marland Kitchen ESOMEPRAZOLE MAGNESIUM 40 MG PO CPDR Oral Take 40 mg by mouth daily before breakfast.      . FENOFIBRATE 160 MG PO TABS  TAKE 1 TABLET BY MOUTH ONCE A DAY WITH MEAL. 30 tablet 9  . FUROSEMIDE 40 MG PO TABS Oral Take 40 mg by mouth as needed.     Marland Kitchen HYDROCODONE-ACETAMINOPHEN 10-650 MG PO TABS Oral Take 1 tablet by mouth every morning. .5 pill every AM     . HYOSCYAMINE 0.15 MG PO TABS Oral Take 0.15 mg by mouth every 4 (four) hours  as needed.      Marland Kitchen LANTUS [place] Subcutaneous Inject 50 Units into the skin every morning.     . INSULIN ISOPHANE & REGULAR (70-30) 100 UNIT/ML [place] SUSP Subcutaneous Inject 12 Units into the skin 3 (three) times daily. Sliding scale    . LISINOPRIL-HYDROCHLOROTHIAZIDE 20-25 MG PO TABS  Oral Take 1 tablet by mouth daily. Dose increase 30 tablet 11  . LOPERAMIDE HCL 2 MG PO CAPS Oral Take 2 mg by mouth 4 (four) times daily as needed.      Marland Kitchen MAGNESIUM OXIDE 400 MG PO TABS Oral Take 400 mg by mouth daily.      Marland Kitchen METOPROLOL TARTRATE 50 MG PO TABS Oral Take 50 mg by mouth 2 (two) times daily.      Marland Kitchen DAILY VITES PO Oral Take 1 tablet by mouth daily.      Marland Kitchen NOVOLOG FLEXPEN 100 UNIT/ML [place] SOLN      . PAROXETINE HCL 40 MG PO TABS Oral Take 40 mg by mouth every morning.      Marland Kitchen POTASSIUM PO Oral Take 20 mEq by mouth 2 (two) times daily.     Marland Kitchen PREGABALIN 50 MG PO CAPS Oral Take 50 mg by mouth 3 (three) times daily.      . CRESTOR PO Oral Take 40 mg by mouth daily.     . TRAMADOL HCL 50 MG PO TABS Oral Take 50 mg by mouth every 6 (six) hours as needed.        There were no vitals taken for this visit.  Physical Exam  Nursing note and vitals reviewed. Constitutional: She is oriented to person, place, and time. She appears well-developed and well-nourished.  Non-toxic appearance.  HENT:  Head: Normocephalic.  Right Ear: Tympanic membrane and external ear normal.  Left Ear: Tympanic membrane and external ear normal.       Laceration right forehead. Laceration right temporal area.  Eyes: EOM and lids are normal. Pupils are equal, round, and reactive to light.  Neck: Normal range of motion. Neck supple. Carotid bruit is not present.  Cardiovascular: Normal rate, regular rhythm, normal heart sounds, intact distal pulses and normal pulses.   Pulmonary/Chest: Breath sounds normal. No respiratory distress. She exhibits no tenderness.       Well-healed mastectomy scar.  Abdominal: Soft. Bowel sounds are normal.  There is no tenderness. There is no guarding.  Musculoskeletal: Normal range of motion.       Pain to palpation lumbar area. Pain to attempted movement of the pelvis area.no deformity of the lower extremities. No deformity of the upper extremities.  Lymphadenopathy:       Head (right side): No submandibular adenopathy present.       Head (left side): No submandibular adenopathy present.    She has no cervical adenopathy.  Neurological: She is alert and oriented to person, place, and time. She has normal strength. No cranial nerve deficit or sensory deficit. She exhibits normal muscle tone. Coordination normal.  Skin: Skin is warm and dry.  Psychiatric: She has a normal mood and affect. Her speech is normal.    ED Course  LACERATION REPAIR Date/Time: 12/02/2011 1:17 AM Performed by: Lenox Ahr Authorized by: Lenox Ahr Consent: Verbal consent obtained. Risks and benefits: risks, benefits and alternatives were discussed Consent given by: patient Patient understanding: patient states understanding of the procedure being performed Patient identity confirmed: arm band Time out: Immediately prior to procedure a "time out" was called to verify the correct patient, procedure, equipment, support staff and site/side marked as required. Body area: head/neck Location details: forehead Laceration length: 1.5 cm Nerve involvement: none Vascular damage: no Patient sedated: no Preparation: Patient was prepped and draped in the usual sterile fashion. Irrigation solution: tap water Amount of cleaning: standard Skin closure: glue Approximation difficulty: simple  Patient tolerance: Patient tolerated the procedure well with no immediate complications.  Wound repair Performed by: Lenox Ahr Authorized by: Lenox Ahr Consent: Verbal consent obtained. Risks and benefits: risks, benefits and alternatives were discussed Consent given by: patient Patient understanding: patient  states understanding of the procedure being performed Patient identity confirmed: arm band Time out: Immediately prior to procedure a "time out" was called to verify the correct patient, procedure, equipment, support staff and site/side marked as required. Preparation: Patient was prepped and draped in the usual sterile fashion. Local anesthesia used: yes Anesthesia: local infiltration Local anesthetic: lidocaine 1% with epinephrine Patient sedated: no Patient tolerance: Patient tolerated the procedure well with no immediate complications. Comments: Temporal area irregular laceration repaired with 4 sutures of 5-0 nylon, simple interrupted stitch. Complications-none. Patient tolerated procedure well.   (including critical care time)  Labs Reviewed  GLUCOSE, CAPILLARY - Abnormal; Notable for the following:    Glucose-Capillary 393 (*)    All other components within normal limits  CBC - Abnormal; Notable for the following:    RBC 3.57 (*)    Hemoglobin 10.8 (*)    HCT 31.8 (*)    All other components within normal limits  BASIC METABOLIC PANEL - Abnormal; Notable for the following:    Sodium 132 (*)    Potassium 5.3 (*)    Glucose, Bld 339 (*)    Creatinine, Ser 1.36 (*)    GFR calc non Af Amer 37 (*)    GFR calc Af Amer 43 (*)    All other components within normal limits  URINALYSIS, ROUTINE W REFLEX MICROSCOPIC - Abnormal; Notable for the following:    Glucose, UA >1000 (*)    Protein, ur 30 (*)    Nitrite POSITIVE (*)    All other components within normal limits  URINE MICROSCOPIC-ADD ON - Abnormal; Notable for the following:    Bacteria, UA MANY (*)    All other components within normal limits  URINE CULTURE   Dg Lumbar Spine Complete  12/02/2011  *RADIOLOGY REPORT*  Clinical Data: Pain after falling on 02/05 place.  LUMBAR SPINE - COMPLETE 4+ VIEW  Comparison: 02/04/2010  Findings: Postoperative changes in the lower lumbar spine with posterior rod and pedicular screw  fixation from L3-L5. Intervertebral disc prosthesis at L3-4 and L4-5.  Fused segments appear stable in orientation without abnormal alignment. Degenerative changes with disc space narrowing and vacuum change as well as hypertrophic changes at L2-3.  Mild anterior compression of the superior endplate of L1.  The appearance is stable since the previous study.  No acute changes are visualized.  Vascular calcifications.  IMPRESSION: Old postoperative changes and mild old compression deformity L1. No acute changes suggested.  Original Report Authenticated By: Neale Burly, M.D.   Dg Pelvis 1-2 Views  12/02/2011  *RADIOLOGY REPORT*  Clinical Data: Pain after fall  PELVIS - 1-2 VIEW  Comparison: 02/04/2010  Findings: Old fracture deformity of the left pubic symphysis.  The pelvis otherwise appears intact.  No focal bone lesion or acute fracture demonstrated.  Sacral struts appear symmetrical.  The SI joints and symphysis pubis remain intact.  Degenerative changes in the hips.  Stable appearance since previous study.  IMPRESSION: Old fracture deformities.  Degenerative changes.  No acute change since previous study.  Original Report Authenticated By: Neale Burly, M.D.   Ct Head Wo Contrast  12/02/2011  *RADIOLOGY REPORT*  Clinical Data:  The patient fell.  Head laceration.  Bilateral neck  pain and stiffness.  CT HEAD WITHOUT CONTRAST CT CERVICAL SPINE WITHOUT CONTRAST  Technique:  Multidetector CT imaging of the head and cervical spine was performed following the standard protocol without intravenous contrast.  Multiplanar CT image reconstructions of the cervical spine were also generated.  Comparison:  02/04/2010 [facility]  CT HEAD  Findings: Diffuse cerebral atrophy.  Mild ventricular dilatation consistent with central atrophy.  No mass effect or midline shift. No abnormal extra-axial fluid collections.  Gray-white matter junctions are distinct.  Basal cisterns are not effaced.  No evidence  of acute intracranial hemorrhage.  Prominence of the sella with low attenuation suggesting empty sella configuration. Vascular calcifications in the intracranial arteries.  Mild mucosal membrane thickening in the maxillary antra.  No acute air-fluid levels in the sinuses.  No depressed skull fractures.  No significant changes since the previous study.  IMPRESSION: Chronic atrophy and small vessel ischemic changes.  No evidence of acute intracranial hemorrhage, mass lesion, or acute infarct.  CT CERVICAL SPINE  Findings: There is mild anterior subluxation of C4 on C5 with slight widening of the C4-5 interspace.  This appearance is stable since the previous study.  Degenerative changes are present at this level in the endplates and facet joints, suggesting this is likely a degenerative process.  Ligamentous injury is not entirely excluded.  Slight anterior subluxation also demonstrated at C5-6 which is stable.  Again, this is likely due to degenerative changes but ligamentous injury is not excluded.  Fragmentation of the anterior inferior endplate of C4 is stable and likely represents limbus vertebrae.  No vertebral compression deformities.  No prevertebral soft tissue swelling.  Lateral masses of C1 appear symmetrical.  The odontoid process is intact.  Degenerative changes throughout the facet joints.  Mild curvature of the cervical spine convex towards the left.  This is likely positional but muscle spasm or torticollis can also have this appearance.  Bone cortex and trabecular architecture appear intact.  No paraspinal soft tissue infiltration.  IMPRESSION:  Degenerative changes throughout the cervical spine.  Mild anterior subluxation of C4 on C5 and C5 on C6, stable since the previous study, and therefore likely due to degenerative changes. Underlying ligamentous injury is not entirely excluded and clinical correlation is recommended.  No displaced fractures identified.  Original Report Authenticated By: Neale Burly, M.D.   Ct Cervical Spine Wo Contrast  12/02/2011  *RADIOLOGY REPORT*  Clinical Data:  The patient fell.  Head laceration.  Bilateral neck pain and stiffness.  CT HEAD WITHOUT CONTRAST CT CERVICAL SPINE WITHOUT CONTRAST  Technique:  Multidetector CT imaging of the head and cervical spine was performed following the standard protocol without intravenous contrast.  Multiplanar CT image reconstructions of the cervical spine were also generated.  Comparison:  02/04/2010 [facility]  CT HEAD  Findings: Diffuse cerebral atrophy.  Mild ventricular dilatation consistent with central atrophy.  No mass effect or midline shift. No abnormal extra-axial fluid collections.  Gray-white matter junctions are distinct.  Basal cisterns are not effaced.  No evidence of acute intracranial hemorrhage.  Prominence of the sella with low attenuation suggesting empty sella configuration. Vascular calcifications in the intracranial arteries.  Mild mucosal membrane thickening in the maxillary antra.  No acute air-fluid levels in the sinuses.  No depressed skull fractures.  No significant changes since the previous study.  IMPRESSION: Chronic atrophy and small vessel ischemic changes.  No evidence of acute intracranial hemorrhage, mass lesion, or acute infarct.  CT CERVICAL SPINE  Findings: There is mild anterior subluxation of C4 on C5 with slight widening of the C4-5 interspace.  This appearance is stable since the previous study.  Degenerative changes are present at this level in the endplates and facet joints, suggesting this is likely a degenerative process.  Ligamentous injury is not entirely excluded.  Slight anterior subluxation also demonstrated at C5-6 which is stable.  Again, this is likely due to degenerative changes but ligamentous injury is not excluded.  Fragmentation of the anterior inferior endplate of C4 is stable and likely represents limbus vertebrae.  No vertebral compression deformities.  No  prevertebral soft tissue swelling.  Lateral masses of C1 appear symmetrical.  The odontoid process is intact.  Degenerative changes throughout the facet joints.  Mild curvature of the cervical spine convex towards the left.  This is likely positional but muscle spasm or torticollis can also have this appearance.  Bone cortex and trabecular architecture appear intact.  No paraspinal soft tissue infiltration.  IMPRESSION:  Degenerative changes throughout the cervical spine.  Mild anterior subluxation of C4 on C5 and C5 on C6, stable since the previous study, and therefore likely due to degenerative changes. Underlying ligamentous injury is not entirely excluded and clinical correlation is recommended.  No displaced fractures identified.  Original Report Authenticated By: Neale Burly, M.D.     1. Fall   2. Urinary tract infection   3. Hyperglycemia   4. Dehydration       MDM  I have reviewed nursing notes, vital signs, and all appropriate lab and imaging results for this patient. 12:29 AM- patient removed from long spine board by me. 02:17patient currently in the CT scanner. Patient's care to be continued by Dr. Venora Maples.  5:18 AM Spoke with [facility] hospitalist who will admit for urinary tract infection, hyperglycemia, dehydration and frequent falls     Hoy Morn, MD 12/02/11 [phone]

## 2011-12-02 NOTE — ED Notes (Signed)
Pt fell at home.  Has two lacerations on head and face.

## 2011-12-02 NOTE — ED Notes (Signed)
Helped patient use the female urinal

## 2011-12-25 ENCOUNTER — Encounter (INDEPENDENT_AMBULATORY_CARE_PROVIDER_SITE_OTHER): Payer: Self-pay | Admitting: General Surgery

## 2011-12-26 ENCOUNTER — Encounter (HOSPITAL_COMMUNITY): Payer: Medicare Other

## 2012-01-16 NOTE — ED Provider Notes (Signed)
History     CSN: [id number]  Arrival date & time 12/02/11  0002   First MD Initiated Contact with Patient 12/02/11 [phone]      Chief Complaint  Patient presents with  . Head Laceration  . Fall    (Consider location/radiation/quality/duration/timing/severity/associated sxs/prior treatment) Patient is a 78 y.o. female presenting with fall. The history is provided by the patient.  Fall The accident occurred 1 to 2 hours ago. The fall occurred while walking. She landed on a hard floor. The point of impact was the head. The pain is present in the head. The pain is moderate. There was no drug use involved in the accident. Pertinent negatives include no visual change, no abdominal pain, no bowel incontinence, no hematuria and no loss of consciousness. Exacerbated by: nothing. Treatment on scene includes a c-collar. She has tried nothing for the symptoms.    Past Medical History  Diagnosis Date  . Breast carcinoma   . Anemia   . IBS (irritable bowel syndrome)     diverticulosis, gastroesophageal reflux disease  . Hypertension   . Asthma   . Diabetes mellitus   . Hyperlipidemia   . Arthritis   . Headaches, cluster   . Lower back pain   . Aortic stenosis     Mild  . Chronic kidney disease     Creatinine-1.5 in 2010 and 2.5-3 in 2011; 1.5-10/2010;  Klebsiella UTI-10/2010. urine protein 36 mg/dl, mildly elevated  . Depression   . Fibromyalgia   . Cerebrovascular disease     with a 60% LICA; repeat study in 10/2009-no obstructive disease; modest ASVD  . Falls infrequently 01/2010    fracture of pelvis and left humerus  . Atrial flutter 2002  . DJD (degenerative joint disease) 11/14/2011  . Obesity 11/14/2011  . Diabetic retinopathy     Past Surgical History  Procedure Date  . Mastectomy     Right breast for carcinoma  . Lumbar disc surgery     lumbosacral spine procedure x 2  . Appendectomy   . Cholecystectomy   . Cataract extraction, bilateral     Lens implants  .  Central venous catheter tunneled insertion single lumen   . Colonoscopy 2012  . Eye surgery nov 2012    for diabetic retinopathy    Family History  Problem Relation Age of Onset  . Alzheimer's disease Mother   . Heart attack Father   . Aneurysm Sister   . Coronary artery disease Brother     History  Substance Use Topics  . Smoking status: Former Games developer  . Smokeless tobacco: Not on file  . Alcohol Use: No    OB History    Grav Para Term Preterm Abortions TAB SAB Ect Mult Living                  Review of Systems  Constitutional: Negative for activity change.       All ROS Neg except as noted in HPI  HENT: Negative for nosebleeds and neck pain.   Eyes: Negative for photophobia and discharge.  Respiratory: Negative for cough, shortness of breath and wheezing.   Cardiovascular: Positive for palpitations. Negative for chest pain.  Gastrointestinal: Negative for abdominal pain, blood in stool and bowel incontinence.  Genitourinary: Negative for dysuria, frequency and hematuria.  Musculoskeletal: Positive for joint swelling and arthralgias. Negative for back pain.  Skin: Negative.   Neurological: Negative for dizziness, seizures, loss of consciousness and speech difficulty.  Psychiatric/Behavioral: Negative for hallucinations and  confusion.    Allergies  Imodium; Mold extract; and Niaspan  Home Medications   Current Outpatient Rx  Name Route Sig Dispense Refill  . ALBUTEROL SULFATE HFA 108 (90 BASE) MCG/ACT IN AERS Inhalation Inhale 2 puffs into the lungs every 6 (six) hours as needed.     Flossie Dibble II OP SOLN Ophthalmic Apply to eye as needed.      . ASPIRIN 81 MG PO TBEC Oral Take 81 mg by mouth daily.      Marland Kitchen CALCIUM 1000 + D PO Oral Take 1 tablet by mouth daily.      . CRESTOR 40 MG PO TABS  TAKE (1) TABLET BY MOUTH AT BEDTIME. 30 each 10  . DOCUSATE SODIUM 100 MG PO CAPS Oral Take 100 mg by mouth daily.      Marland Kitchen ESOMEPRAZOLE MAGNESIUM 40 MG PO CPDR Oral Take  40 mg by mouth daily before breakfast.      . FENOFIBRATE 160 MG PO TABS  TAKE 1 TABLET BY MOUTH ONCE A DAY WITH MEAL. 30 tablet 9  . FUROSEMIDE 40 MG PO TABS Oral Take 40 mg by mouth as needed.     Marland Kitchen HYDROCODONE-ACETAMINOPHEN 10-650 MG PO TABS Oral Take 1 tablet by mouth every 6 (six) hours as needed. For pain     . PAROXETINE HCL 40 MG PO TABS Oral Take 40 mg by mouth every morning.      . CYCLOBENZAPRINE HCL 10 MG PO TABS Oral Take 10 mg by mouth 3 (three) times daily as needed.     Marland Kitchen DIAZEPAM 10 MG PO TABS Oral Take 10 mg by mouth every 6 (six) hours as needed.      Marland Kitchen HYOSCYAMINE 0.15 MG PO TABS Oral Take 0.15 mg by mouth every 4 (four) hours as needed.      Marland Kitchen LANTUS [place] Subcutaneous Inject 50 Units into the skin every morning.     . INSULIN ISOPHANE & REGULAR (70-30) 100 UNIT/ML [place] SUSP Subcutaneous Inject 12 Units into the skin 3 (three) times daily. Sliding scale    . LISINOPRIL-HYDROCHLOROTHIAZIDE 20-25 MG PO TABS Oral Take 1 tablet by mouth daily. Dose increase 30 tablet 11  . LOPERAMIDE HCL 2 MG PO CAPS Oral Take 2 mg by mouth 4 (four) times daily as needed.      Marland Kitchen MAGNESIUM OXIDE 400 MG PO TABS Oral Take 400 mg by mouth daily.      Marland Kitchen METOPROLOL TARTRATE 50 MG PO TABS Oral Take 50 mg by mouth 2 (two) times daily.      Marland Kitchen DAILY VITES PO Oral Take 1 tablet by mouth daily.      Marland Kitchen NOVOLOG FLEXPEN 100 UNIT/ML [place] SOLN      . PREGABALIN 50 MG PO CAPS Oral Take 50 mg by mouth 3 (three) times daily.      . TRAMADOL HCL 50 MG PO TABS Oral Take 50 mg by mouth every 6 (six) hours as needed.        BP 130/77  Pulse 75  Temp(Src) 99 F (37.2 C) (Oral)  Resp 20  Ht $R'5\' 6"'WM$  (1.676 m)  Wt 244 lb (110.678 kg)  BMI 39.38 kg/m2  SpO2 94%  Physical Exam  Nursing note and vitals reviewed. Constitutional: She appears well-developed and well-nourished.  Non-toxic appearance.  HENT:  Head: Normocephalic.  Right Ear: Tympanic membrane and external ear normal.  Left Ear: Tympanic membrane and external  ear normal.  Laceration to the right temporal area, in the right for head area. No pain to palpation of the nose.  Eyes: EOM and lids are normal. Pupils are equal, round, and reactive to light.  Neck: Normal range of motion. Neck supple. Carotid bruit is not present.       Cervical collar in place. No palpable deformity of the cervical spine.  Cardiovascular: Normal rate, regular rhythm, intact distal pulses and normal pulses.   Murmur heard. Pulmonary/Chest: Breath sounds normal. No respiratory distress.  Abdominal: Soft. Bowel sounds are normal. There is no tenderness. There is no guarding.       Suprapubic area tenderness to palpation. No rebound. Bowel sounds are present and active.  Musculoskeletal: Normal range of motion.       Lumbar area tenderness to palpation but no step-off appreciated. Full range of motion of the shoulders elbows wrists and fingers. Stiffness of the knees but no palpable deformity.  Lymphadenopathy:       Head (right side): No submandibular adenopathy present.       Head (left side): No submandibular adenopathy present.    She has no cervical adenopathy.  Neurological: She is alert. She has normal strength. No cranial nerve deficit or sensory deficit. She exhibits normal muscle tone.  Skin: Skin is warm and dry.  Psychiatric: She has a normal mood and affect. Her speech is normal.    ED Course  Procedures (including critical care time)  Labs Reviewed  GLUCOSE, CAPILLARY - Abnormal; Notable for the following:    Glucose-Capillary 393 (*)    All other components within normal limits  CBC - Abnormal; Notable for the following:    RBC 3.57 (*)    Hemoglobin 10.8 (*)    HCT 31.8 (*)    All other components within normal limits  BASIC METABOLIC PANEL - Abnormal; Notable for the following:    Sodium 132 (*)    Potassium 5.3 (*)    Glucose, Bld 339 (*)    Creatinine, Ser 1.36 (*)    GFR calc non Af Amer 37 (*)    GFR calc Af Amer 43 (*)    All other  components within normal limits  URINALYSIS, ROUTINE W REFLEX MICROSCOPIC - Abnormal; Notable for the following:    Glucose, UA >1000 (*)    Protein, ur 30 (*)    Nitrite POSITIVE (*)    All other components within normal limits  URINE MICROSCOPIC-ADD ON - Abnormal; Notable for the following:    Bacteria, UA MANY (*)    All other components within normal limits  MRSA PCR SCREENING - Abnormal; Notable for the following:    MRSA by PCR POSITIVE (*)    All other components within normal limits  GLUCOSE, CAPILLARY - Abnormal; Notable for the following:    Glucose-Capillary 258 (*)    All other components within normal limits  GLUCOSE, CAPILLARY - Abnormal; Notable for the following:    Glucose-Capillary 360 (*)    All other components within normal limits  LAB REPORT - SCANNED  URINE CULTURE   No results found.   1. Fall   2. Urinary tract infection   3. Hyperglycemia   4. Dehydration   5. DIABETES MELLITUS, TYPE II   6. HYPERLIPIDEMIA   7. DEPRESSION/ANXIETY   8. Atrial flutter   9. Orthostatic hypotension   10. SPINAL STENOSIS, LUMBAR   11. Inflammatory carcinoma of right breast   12. Anemia   13. IBS (irritable bowel syndrome)  14. Hypertension   15. Aortic stenosis   16. Chronic kidney disease   17. Fibromyalgia   18. Cerebrovascular disease   19. Falls infrequently   20. DJD (degenerative joint disease)   21. Obesity    PROCEDURE: The wound to the right temporal area measures 2.6 cm. The wound was irrigated with saline. Then cleansed with Betadine. Repaired with Steri-Strips and Dermabond. There is good wound edge anastomosis.  PROCEDURE: The wound to the fourth measures 4 cm. The wound was irrigated with saline. The wound was cleansed with Betadine. The wound was repaired with Steri-Strips and Dermabond with good wound edge anastomosis. Bleeding controlled.   MDM  I have reviewed nursing notes, vital signs, and all appropriate lab and imaging results for this  patient.        Lenox Ahr, [place] 01/18/12 [phone]

## 2012-01-19 NOTE — ED Provider Notes (Signed)
Medical screening examination/treatment/procedure(s) were performed by non-physician practitioner and as supervising physician I was immediately available for consultation/collaboration.   Hoy Morn, MD 01/19/12 [id number]

## 2012-01-22 ENCOUNTER — Encounter (INDEPENDENT_AMBULATORY_CARE_PROVIDER_SITE_OTHER): Payer: Self-pay | Admitting: General Surgery

## 2012-01-22 ENCOUNTER — Ambulatory Visit (INDEPENDENT_AMBULATORY_CARE_PROVIDER_SITE_OTHER): Payer: Medicare Other | Admitting: General Surgery

## 2012-01-22 VITALS — BP 134/80 | HR 80 | Temp 97.4°F | Resp 16

## 2012-01-22 DIAGNOSIS — C50919 Malignant neoplasm of unspecified site of unspecified female breast: Secondary | ICD-10-CM

## 2012-01-22 NOTE — Patient Instructions (Signed)
Continue regular self exams

## 2012-01-28 ENCOUNTER — Encounter (INDEPENDENT_AMBULATORY_CARE_PROVIDER_SITE_OTHER): Payer: Self-pay | Admitting: General Surgery

## 2012-01-28 NOTE — Progress Notes (Signed)
Subjective:     Patient ID: [name], female   DOB: 1933/02/20, 78 y.o.   MRN: [id number]  HPI The patient is a 78 year old white female who is 4-1/2 years out from a right mastectomy and axillary node dissection for an inflammatory right breast cancer. Her postoperative course was complicated by some skin necrosis which has since healed. Since her last visit she is starting to feel a little bit better. She states that her kidney function is improving. She has had some problems recently with falling and has been evaluated with imaging studies of her brain which I believe have been negative.  Review of Systems  Constitutional: Negative.   HENT: Negative.   Eyes: Negative.   Respiratory: Negative.   Cardiovascular: Positive for chest pain.  Gastrointestinal: Negative.   Genitourinary: Negative.   Musculoskeletal: Positive for gait problem.  Skin: Negative.   Neurological: Positive for syncope.  Hematological: Negative.   Psychiatric/Behavioral: Negative.        Objective:   Physical Exam  Constitutional: She is oriented to person, place, and time. She appears well-developed and well-nourished.  HENT:  Head: Normocephalic and atraumatic.  Eyes: Conjunctivae and EOM are normal. Pupils are equal, round, and reactive to light.  Neck: Normal range of motion. Neck supple.  Cardiovascular: Normal rate, regular rhythm and normal heart sounds.   Pulmonary/Chest: Effort normal and breath sounds normal.       No palpable mass of the right chest wall. No palpable mass of the left breast. No axillary supraclavicular or cervical lymphadenopathy.   Abdominal: Soft. Bowel sounds are normal. She exhibits no mass. There is no tenderness.  Musculoskeletal: Normal range of motion.  Lymphadenopathy:    She has no cervical adenopathy.  Neurological: She is alert and oriented to person, place, and time.  Skin: Skin is warm and dry.  Psychiatric: She has a normal mood and affect. Her behavior is  normal.       Assessment:     4 1/2 years status post right mastectomy and axillary node dissection for an inflammatory breast cancer    Plan:     At this point she will continue to do regular self exams. We will plan to see her back in about 6 months.

## 2012-03-18 ENCOUNTER — Encounter (HOSPITAL_COMMUNITY): Payer: Medicare Other | Attending: Oncology

## 2012-03-18 DIAGNOSIS — C50419 Malignant neoplasm of upper-outer quadrant of unspecified female breast: Secondary | ICD-10-CM

## 2012-03-18 DIAGNOSIS — C50919 Malignant neoplasm of unspecified site of unspecified female breast: Secondary | ICD-10-CM

## 2012-03-18 DIAGNOSIS — Z452 Encounter for adjustment and management of vascular access device: Secondary | ICD-10-CM

## 2012-03-18 MED ORDER — SODIUM CHLORIDE 0.9 % IJ SOLN
10.0000 mL | INTRAMUSCULAR | Status: DC | PRN
  Administered 2012-03-18: 10 mL via INTRAVENOUS
  Filled 2012-03-18: qty 10

## 2012-03-18 MED ORDER — SODIUM CHLORIDE 0.9 % IJ SOLN
INTRAMUSCULAR | Status: AC
  Filled 2012-03-18: qty 10

## 2012-03-18 MED ORDER — HEPARIN SOD (PORK) LOCK FLUSH 100 UNIT/ML IV SOLN
500.0000 [IU] | Freq: Once | INTRAVENOUS | Status: AC
  Administered 2012-03-18: 500 [IU] via INTRAVENOUS
  Filled 2012-03-18: qty 5

## 2012-03-18 MED ORDER — HEPARIN SOD (PORK) LOCK FLUSH 100 UNIT/ML IV SOLN
INTRAVENOUS | Status: AC
  Filled 2012-03-18: qty 5

## 2012-04-03 ENCOUNTER — Other Ambulatory Visit (HOSPITAL_COMMUNITY): Payer: Self-pay | Admitting: Oncology

## 2012-04-03 ENCOUNTER — Other Ambulatory Visit (INDEPENDENT_AMBULATORY_CARE_PROVIDER_SITE_OTHER): Payer: Self-pay | Admitting: General Surgery

## 2012-04-03 DIAGNOSIS — Z139 Encounter for screening, unspecified: Secondary | ICD-10-CM

## 2012-04-11 NOTE — Progress Notes (Signed)
Tolerated port flush well. 

## 2012-04-23 ENCOUNTER — Other Ambulatory Visit: Payer: Self-pay | Admitting: Adult Health

## 2012-04-24 ENCOUNTER — Telehealth: Payer: Self-pay | Admitting: *Deleted

## 2012-04-24 NOTE — Telephone Encounter (Signed)
lmom to verfiy lasix doseage/ct

## 2012-04-29 ENCOUNTER — Other Ambulatory Visit: Payer: Self-pay | Admitting: *Deleted

## 2012-04-29 ENCOUNTER — Encounter (HOSPITAL_COMMUNITY): Payer: Medicare Other

## 2012-04-29 MED ORDER — FUROSEMIDE 40 MG PO TABS: 40.0000 mg | ORAL_TABLET | ORAL | Status: DC | PRN

## 2012-04-29 NOTE — Telephone Encounter (Signed)
Pt c/o edema in LE, asked pt what her b/p was running and she states she didn't know, hasnt had follow up b/p check from last OV from 07/2011, does pt need a f/u appt with Dr. Lattie Haw.

## 2012-04-29 NOTE — Telephone Encounter (Signed)
Patient states that she has not taken lasix for quite some time, which is prn and has noticed an increase edema.  Advised her to fill and begin taking as directed and to call our office if edema does not improve and we will be happy to make her an appt.

## 2012-04-30 ENCOUNTER — Encounter (HOSPITAL_COMMUNITY): Payer: Medicare Other

## 2012-05-12 ENCOUNTER — Ambulatory Visit (HOSPITAL_COMMUNITY): Payer: Medicare Other

## 2012-05-14 ENCOUNTER — Ambulatory Visit (HOSPITAL_COMMUNITY)
Admission: RE | Admit: 2012-05-14 | Discharge: 2012-05-14 | Disposition: A | Discharge status: Home / Self Care | Payer: Medicare Other | Source: Ambulatory Visit | Attending: General Surgery | Admitting: General Surgery

## 2012-05-14 ENCOUNTER — Ambulatory Visit (HOSPITAL_COMMUNITY): Payer: Medicare Other | Admitting: Oncology

## 2012-05-14 DIAGNOSIS — Z139 Encounter for screening, unspecified: Secondary | ICD-10-CM

## 2012-05-14 DIAGNOSIS — Z1231 Encounter for screening mammogram for malignant neoplasm of breast: Secondary | ICD-10-CM | POA: Insufficient documentation

## 2012-05-19 ENCOUNTER — Encounter (HOSPITAL_COMMUNITY): Payer: Self-pay | Admitting: Oncology

## 2012-05-19 ENCOUNTER — Telehealth (INDEPENDENT_AMBULATORY_CARE_PROVIDER_SITE_OTHER): Payer: Self-pay | Admitting: General Surgery

## 2012-05-19 ENCOUNTER — Encounter (HOSPITAL_COMMUNITY): Payer: Medicare Other | Attending: Oncology | Admitting: Oncology

## 2012-05-19 ENCOUNTER — Encounter (HOSPITAL_COMMUNITY): Payer: Medicare Other

## 2012-05-19 ENCOUNTER — Ambulatory Visit (HOSPITAL_COMMUNITY)
Admission: RE | Admit: 2012-05-19 | Discharge: 2012-05-19 | Disposition: A | Discharge status: Home / Self Care | Payer: Medicare Other | Source: Ambulatory Visit | Attending: Oncology | Admitting: Oncology

## 2012-05-19 VITALS — BP 177/67 | HR 60 | Temp 98.0°F | Wt 255.8 lb

## 2012-05-19 DIAGNOSIS — C50919 Malignant neoplasm of unspecified site of unspecified female breast: Secondary | ICD-10-CM | POA: Insufficient documentation

## 2012-05-19 DIAGNOSIS — R059 Cough, unspecified: Secondary | ICD-10-CM | POA: Insufficient documentation

## 2012-05-19 DIAGNOSIS — Z171 Estrogen receptor negative status [ER-]: Secondary | ICD-10-CM

## 2012-05-19 DIAGNOSIS — R0602 Shortness of breath: Secondary | ICD-10-CM | POA: Insufficient documentation

## 2012-05-19 DIAGNOSIS — R05 Cough: Secondary | ICD-10-CM | POA: Insufficient documentation

## 2012-05-19 LAB — COMPREHENSIVE METABOLIC PANEL
Albumin: 3.6 g/dL (ref 3.5–5.2)
BUN: 30 mg/dL — ABNORMAL HIGH (ref 6–23)
CO2: 25 mEq/L (ref 19–32)
Chloride: 106 mEq/L (ref 96–112)
Creatinine, Ser: 2.07 mg/dL — ABNORMAL HIGH (ref 0.50–1.10)
GFR calc Af Amer: 25 mL/min — ABNORMAL LOW (ref >90)
GFR calc non Af Amer: 22 mL/min — ABNORMAL LOW (ref >90)
Glucose, Bld: 145 mg/dL — ABNORMAL HIGH (ref 70–99)
Total Bilirubin: 0.1 mg/dL — ABNORMAL LOW (ref 0.3–1.2)

## 2012-05-19 LAB — DIFFERENTIAL
Basophils Relative: 1 % (ref 0–1)
Lymphs Abs: 2.8 10*3/uL (ref 0.7–4.0)
Monocytes Absolute: 0.4 10*3/uL (ref 0.1–1.0)
Monocytes Relative: 5 % (ref 3–12)
Neutro Abs: 3.2 10*3/uL (ref 1.7–7.7)

## 2012-05-19 LAB — CBC
HCT: 34.7 % — ABNORMAL LOW (ref 36.0–46.0)
Hemoglobin: 11.1 g/dL — ABNORMAL LOW (ref 12.0–15.0)
MCHC: 32 g/dL (ref 30.0–36.0)

## 2012-05-19 MED ORDER — HEPARIN SOD (PORK) LOCK FLUSH 100 UNIT/ML IV SOLN
500.0000 [IU] | Freq: Once | INTRAVENOUS | Status: AC
  Administered 2012-05-19: 500 [IU] via INTRAVENOUS
  Filled 2012-05-19: qty 5

## 2012-05-19 MED ORDER — SODIUM CHLORIDE 0.9 % IJ SOLN
10.0000 mL | Freq: Once | INTRAMUSCULAR | Status: AC
  Administered 2012-05-19: 10 mL via INTRAVENOUS
  Filled 2012-05-19: qty 10

## 2012-05-19 MED ORDER — HEPARIN SOD (PORK) LOCK FLUSH 100 UNIT/ML IV SOLN
INTRAVENOUS | Status: AC
  Filled 2012-05-19: qty 5

## 2012-05-19 NOTE — Patient Instructions (Addendum)
[facility]  Discharge Instructions [name]  [id number] 08/17/1933 Dr. Everardo All    RECOMMENDATIONS MADE BY THE CONSULTANT AND ANY TEST RESULTS WILL BE SENT TO YOUR REFERRING DOCTOR.   EXAM FINDINGS BY MD TODAY AND SIGNS AND SYMPTOMS TO REPORT TO CLINIC OR PRIMARY MD:   Return in 6 months to see Dr. Tressie Stalker   I acknowledge that I have been informed and understand all the instructions given to me and received a copy. I do not have any more questions at this time, but understand that I may call the [facility] at [facility] at [phone] during business hours should I have any further questions or need assistance in obtaining follow-up care.    __________________________________________  _____________  __________ Signature of Patient or Authorized Representative            Date                   Time    __________________________________________ Nurse's Signature

## 2012-05-19 NOTE — Telephone Encounter (Signed)
LMOM for patient to call me back.  This is in regards to her [facility]

## 2012-05-19 NOTE — Progress Notes (Signed)
[name] presented for Portacath access and flush. Proper placement of portacath confirmed by CXR. Portacath located lt chest wall accessed with  H 20 needle. Good blood return present. Portacath flushed with 78ml NS and 500U/66ml Heparin and needle removed intact. Procedure without incident. Patient tolerated procedure well.  [name] presented for labwork. Labs per MD order drawn via Peripheral Line 23 gauge needle inserted in lt ac  Good blood return present. Procedure without incident.  Needle removed intact. Patient tolerated procedure well.

## 2012-05-19 NOTE — Telephone Encounter (Signed)
Pt returned my phone call and I informed her that her MGM looked negative.

## 2012-05-19 NOTE — Progress Notes (Signed)
Leonides Grills, MD [address] [place] [address]  1. Inflammatory carcinoma of right breast     INTERVAL HISTORY: [name] 78 y.o. female returns for  regular  visit for followup of right-sided inflammatory carcinoma of the breast with her initial biopsy on 10/22/2006. This was an ER, PR negative cancer but HER-2 3+ positive. She is status post FEC x4 cycles followed by surgery. She was then treated with Taxotere and Navelbine along with the initiation of Herceptin. The Taxotere and Navelbine were only tolerated for 2 cycles. She had developed a very slow wound healing. She declined radiation therapy but did take Herceptin for one year total.    Mrs. Vanderweele is doing well oncologically.  She reports increased SOB and she reports that she thinks it is from her heart.  She is due to see her cardiologist in the near future.  She reports that she has been requiring her inhaler a little more frequently than before.  We will get a chest X-ray today.  Unfortunately, the patient was admitted to the hospital for a fall that required suturing of her forehead.  She recovered nicely.  She has not fallen since.   She reports that she had an instance where she thought she heard her niece's voice at home and found out that she was not there.  She swears that she heard her voice.  She denies dreaming because she reports that she can usually tell the difference between a dream and reality, although she had some dreams that night.  She denies it happening since, but we should be vigilant of this complaint.  She will let us know if it happens again and she will report it to her PCP.  I personally reviewed and went over radiographic studies with the patient.  Her mammogram was done the other day.  It was a BI-RADS Category I.  She was encouraged to continue with yearly screening mammograms.    Past Medical History  Diagnosis Date  . Breast carcinoma   . Anemia   . IBS (irritable  bowel syndrome)     diverticulosis, gastroesophageal reflux disease  . Hypertension   . Asthma   . Diabetes mellitus   . Hyperlipidemia   . Arthritis   . Headaches, cluster   . Lower back pain   . Aortic stenosis     Mild  . Chronic kidney disease     Creatinine-1.5 in 2010 and 2.5-3 in 2011; 1.5-10/2010;  Klebsiella UTI-10/2010. urine protein 36 mg/dl, mildly elevated  . Depression   . Fibromyalgia   . Cerebrovascular disease     with a 96% LICA; repeat study in 10/2009-no obstructive disease; modest ASVD  . Falls infrequently 01/2010    fracture of pelvis and left humerus  . Atrial flutter 2002  . DJD (degenerative joint disease) 11/14/2011  . Obesity 11/14/2011  . Diabetic retinopathy   . Decreased bone density     has DIABETES MELLITUS, TYPE II; HYPERLIPIDEMIA; DEPRESSION/ANXIETY; Atrial flutter; ORTHOSTATIC HYPOTENSION; SPINAL STENOSIS, LUMBAR; Inflammatory carcinoma of right breast; Anemia; IBS (irritable bowel syndrome); Hypertension; Aortic stenosis; Chronic kidney disease; Fibromyalgia; Cerebrovascular disease; Falls infrequently; DJD (degenerative joint disease); Obesity; Fall; Urinary tract infection; and Hyperglycemia on her problem list.     is allergic to imodium; mold extract; and niaspan.  Ms. Mansour does not currently have medications on file.  Past Surgical History  Procedure Date  . Mastectomy     Right breast for carcinoma  .  Lumbar disc surgery     lumbosacral spine procedure x 2  . Appendectomy   . Cholecystectomy   . Cataract extraction, bilateral     Lens implants  . Central venous catheter tunneled insertion single lumen   . Colonoscopy 2012  . Eye surgery nov 2012    for diabetic retinopathy    Denies any headaches, dizziness, double vision, fevers, chills, night sweats, nausea, vomiting, diarrhea, constipation, chest pain, heart palpitations, shortness of breath, blood in stool, black tarry stool, urinary pain, urinary burning, urinary  frequency, hematuria.   PHYSICAL EXAMINATION  ECOG PERFORMANCE STATUS: 2 - Symptomatic, <50% confined to bed  There were no vitals filed for this visit.  GENERAL:alert, no distress, well nourished, well developed, comfortable, cooperative, obese and smiling SKIN: skin color, texture, turgor are normal, no rashes or significant lesions HEAD: Normocephalic, No masses, lesions, tenderness or abnormalities EYES: normal, Conjunctiva are pink and non-injected EARS: External ears normal OROPHARYNX:lips, buccal mucosa, and tongue normal and mucous membranes are moist  NECK: supple, no adenopathy, no stridor, non-tender, trachea midline LYMPH:  no palpable lymphadenopathy BREAST:left breast normal without mass, skin or nipple changes or axillary nodes, right post-mastectomy site well healed and free of suspicious changes LUNGS: clear to auscultation and percussion, decreased breath sounds HEART: regular rate & rhythm, no murmurs, no gallops, S1 normal and S2 normal ABDOMEN:abdomen soft, non-tender, obese and normal bowel sounds BACK: Back symmetric, no curvature. EXTREMITIES:less then 2 second capillary refill, no joint deformities, effusion, or inflammation, no skin discoloration, no clubbing, no cyanosis, positive findings:  edema B/L 1+ pitting ankle and pedal edema  NEURO: alert & oriented x 3 with fluent speech, no focal motor/sensory deficits   PENDING LABS: CBC diff, CMET   RADIOGRAPHIC STUDIES:  05/14/2012  *RADIOLOGY REPORT*  Clinical Data: Screening.  LEFT DIGITAL SCREENING MAMMOGRAM WITH CAD  Comparison: None  Findings: The patient has had a right mastectomy. Views of the  left breast demonstrate heterogeneously dense tissue . No  suspicious masses, architectural distortion, or calcifications are  present.  Images were processed with CAD.  IMPRESSION:  No evidence of malignancy. Screening mammography is recommended in  one year.  RECOMMENDATION:  Screening mammogram in  one year. (Code:SM-B-01Y)  BI-RADS CATEGORY 1: Negative  Original Report Authenticated By: Gerald Stabs, M.D.    PATHOLOGY: 05/07/2007  REPORT OF SURGICAL PATHOLOGY  Case #: [id number] Patient Name: KRYSTELLE, PRASHAD. Office Chart Number: N/A  MRN: [id number] Pathologist: Louanne Belton. Rodney Cruise, MD DOB/Age [birth year] (Age: 49) Gender: F Date Taken: 05/07/2007 Date Received: 05/07/2007  FINAL DIAGNOSIS  MICROSCOPIC EXAMINATION AND DIAGNOSIS  BREAST, RIGHT, MODIFIED RADICAL MASTECTOMY: - NO RESIDUAL CARCINOMA IDENTIFIED. - PREVIOUS BIOPSY SITE AND ADJACENT BREAST PARENCHYMA WITH EVIDENCE OF TUMOR REGRESSION. - SKIN WITH SEBORRHEIC KERATOSIS. - NIPPLE, DEEP MARGIN FREE OF TUMOR. - THREE ENLARGED LYMPH NODES PARTLY REPLACED BY FAT, ONE WITH EVIDENCE OF TUMOR REGRESSION.  COMMENT ONCOLOGY TABLE-BREAST, INCISIONAL/EXCISIONAL BIOPSY OR MASTECTOMY WITH LYMPH NODES  1. Maximum tumor size (cm): No residual tumor identified. 2. Margins: Free of tumor 3. Vascular/Lymphatic invasion: Negative 4. Histology, invasive component: Ductal (based on previous needle biopsy [id number]) 5. Grade, invasive component (Elston-Ellis modified Scarff-Bloom-Richardson): III (based on needle biopsy specimen [id number]) 6. Extensive intraductal component (JCRT): Negative 7. Histology, in situ component: N/A 8. Grade, in situ component: N/A 9. Multicentric (separate tumors in different quadrants): No 10. Multifocal (separate tumors in same quadrant or biopsy): No 11. Axillary lymph nodes: #examined: 3 #with metastasis: 0 12.  TNM Code: ypT0, ypN0, pMX 13. Breast Prognostic Markers: Case number [id number] Estrogen receptor 0, negative Progesterone receptor 0, negative Ki 67 (Mib-1) 45% Her 2 neu (HercepTest) 3+ Her 2 neu by FISH N/A 14. Non-neoplastic breast: Fibrocystic changes. 15. Comments: No residual carcinoma is identified in the breast biopsy specimen. At least one of three axillary lymph nodes  shows evidence of tumor regression. Cytokeratin AE1/AE3 stain is performed on one lymph node (block labeled AA) which shows tumor regression is negative. No metastatic tumor is identified.  Cytokeratin AE1/AE3 stain control reacted appropriately. (EA:mj 05/09/07)  mj Date Reported: 05/09/2007 Jaquelyn Bitter A. Rodney Cruise, MD Electronically Signed Out By EAA   Clinical information Right breast cancer (as)  specimen(s) obtained Breast, modified radical mastectomy and axillary lymph nodes, right  Gross Description Specimen: Received fresh is a clinically right modified radical mastectomy. Weight: 2587 grams Size: 31 x 24.5 x 7.5 cm with an attached 12 x 8 x 4.5 cm axillary content Skin: 24.5 x 19 cm tan skin ellipse with a central 1 cm in greatest diameter erect nipple with the surrounding areola and the surrounding skin displaying a prominent dimpling of the skin. There are also multiple brown wrinkled skin papule and skin macules ranging from 0.1 cm up to 1.6 x 1.2 cm. There is also a 6.5 x 0.3 cm well healed slightly oblique scar in the upper outer quadrant. Tumor/cavity: A 2.5 x 2 x 0.9 cm firm tan-white indurated process containing a 0.6 cm in greatest dimension biopsy cavity with a mammotome clip. The deep margin measures 3 cm from the tumor/cavity. Uninvolved parenchyma: Predominantly fatty with a scant amount of intermixing dense fibrous appearing tissue. Prognostic indicators: Will be obtained off of paraffin block if needed. Lymph nodes: Six nodules ranging from 0.2 cm up to 3.2 cm in greatest dimension, most have a rim of tan-pink tissue with fatty infiltration. Block summary: Twenty-nine blocks A = nipple and scar B = representative section of the largest skin lesion and prominent skin dimpling C-G = representative section of the biopsy tumor and cavity H = representative sections of deep margin and area of tumor/biopsy cavity I = upper inner quadrant sections, one to  include deep margin J = lower inner quadrant sections, one to include deep margin K = representative sections of lower outer quadrant sections one to include deep margin L = single nodule, bisected M = single nodule, bisected N-S = single nodule, multiple sections T-CC = single nodule, multiple sections (SP:mj 05/07/07)  mj/      ASSESSMENT:  1. Inflammatory breast cancer, S/P right mastectomy followed by chemotherapy. Declined radiation therapy.  2. DJD  3. Obesity  4. Depression/Anxiety  5. DM  6. HTN  7. IBS   PLAN:  1. I personally reviewed and went over laboratory results with the patient. 2. I personally reviewed and went over radiographic studies with the patient. 3. Lab work today: CBC diff, CMET 4. Port-flush today 5. Continue yearly screening mammogram. 6. Chest X-ray today. 7. Follow-up with cardiologist as scheduled.  8. Return in 6 months for follow-up.   All questions were answered. The patient knows to call the clinic with any problems, questions or concerns. We can certainly see the patient much sooner if necessary.  The patient and plan discussed with Everardo All, MD and he is in agreement with the aforementioned.  Laura Caldas

## 2012-05-19 NOTE — Telephone Encounter (Signed)
Message copied by Juanita Laster on Mon May 19, 2012  8:30 AM ------      Message from: Luella Cook III      Created: Mon May 19, 2012  8:23 AM       Looks neg

## 2012-06-12 ENCOUNTER — Encounter: Payer: Self-pay | Admitting: Cardiology

## 2012-06-12 ENCOUNTER — Ambulatory Visit (INDEPENDENT_AMBULATORY_CARE_PROVIDER_SITE_OTHER): Payer: Medicare Other | Admitting: Cardiology

## 2012-06-12 VITALS — BP 130/64 | HR 69 | Ht 71.0 in | Wt 261.8 lb

## 2012-06-12 DIAGNOSIS — E785 Hyperlipidemia, unspecified: Secondary | ICD-10-CM

## 2012-06-12 DIAGNOSIS — I679 Cerebrovascular disease, unspecified: Secondary | ICD-10-CM

## 2012-06-12 DIAGNOSIS — I359 Nonrheumatic aortic valve disorder, unspecified: Secondary | ICD-10-CM

## 2012-06-12 DIAGNOSIS — N189 Chronic kidney disease, unspecified: Secondary | ICD-10-CM

## 2012-06-12 DIAGNOSIS — I4892 Unspecified atrial flutter: Secondary | ICD-10-CM

## 2012-06-12 DIAGNOSIS — I951 Orthostatic hypotension: Secondary | ICD-10-CM

## 2012-06-12 DIAGNOSIS — M48061 Spinal stenosis, lumbar region without neurogenic claudication: Secondary | ICD-10-CM

## 2012-06-12 DIAGNOSIS — I10 Essential (primary) hypertension: Secondary | ICD-10-CM

## 2012-06-12 DIAGNOSIS — D649 Anemia, unspecified: Secondary | ICD-10-CM

## 2012-06-12 DIAGNOSIS — I350 Nonrheumatic aortic (valve) stenosis: Secondary | ICD-10-CM

## 2012-06-12 NOTE — Assessment & Plan Note (Signed)
In the absence of symptoms and with no significant obstruction on a study 30 months ago, routine periodic monitoring may not be cost effective.

## 2012-06-12 NOTE — Patient Instructions (Signed)
Your physician wants you to follow-up in: 1 year with Dr. Lattie Haw. You will receive a reminder letter in the mail two months in advance. If you don't receive a letter, please call our office to schedule the follow-up appointment.  Your physician recommends that you continue on your current medications as directed. Please refer to the Current Medication list given to you today.

## 2012-06-12 NOTE — Assessment & Plan Note (Addendum)
Blood pressure control is good in the office, but suboptimal at home.  This appears to reflect the fact that she is using a small cuff with her home device.  When that was compared against our sphygmomanometer, systolic pressure was 25 points higher on her machine.  A new cuff was recommended.  State of [place] has requested a medical report in order for patient to maintain her license.  I filled out the cardiovascular section in a favorable fashion and returned that form to her.

## 2012-06-12 NOTE — Assessment & Plan Note (Signed)
Adequate in much improved control of hyperlipidemia with persistent mild to moderate hypertriglyceridemia.  No additional pharmacologic therapy is necessary.  Improved control of diabetes would likely result in a further lowering of triglycerides level.

## 2012-06-12 NOTE — Assessment & Plan Note (Signed)
No documented recurrence over the past decade.  This also appears to be an issue that will not likely cause significant problems in the future.

## 2012-06-12 NOTE — Assessment & Plan Note (Signed)
Moderate chronic kidney disease that is slowly progressive and now will require additional monitoring and treatment.

## 2012-06-12 NOTE — Assessment & Plan Note (Addendum)
Remains mild by exam.  In this 78 year old woman with multiple significant noncardiac medical problems, it is highly unlikely that aortic valve disease will ever cause significant clinical problems.

## 2012-06-12 NOTE — Progress Notes (Signed)
Patient ID: [name], female   DOB: 1933-04-02, 78 y.o.   MRN: [id number]  HPI: Scheduled return visit for this very nice woman with a plethora of medical problems but fairly stable cardiac status.  She has suffered progressive disability due to arthritic discomfort in the knees and chronic lower back problems with previous lumbosacral surgery.  She walks with a walker, has very little physical activity and has abandoned most of her housework.  She denies chest discomfort, dyspnea on exertion, orthopnea or PND.  She has mild intermittent pedal edema and a sense of fluid retention for which she uses Furosemide as needed.  Usage has been curtailed lately due to multiple physician visits on a near daily basis.  She has monitored blood pressure at home with approximately 89% of systolics less than 381 but a substantial number of values in the 150s and 160s.  Prior to Admission medications   Medication Sig Start Date End Date Taking? Authorizing Provider  albuterol (PROAIR HFA) 108 (90 BASE) MCG/ACT inhaler Inhale 2 puffs into the lungs every 6 (six) hours as needed.    Yes Historical Provider, MD  Artificial Tear Solution (TEARS NATURALE II) SOLN Apply to eye as needed.     Yes Historical Provider, MD  aspirin (ASPIR-LOW) 81 MG EC tablet Take 81 mg by mouth daily.     Yes Historical Provider, MD  Calcium Carb-Cholecalciferol (CALCIUM 1000 + D PO) Take 1 tablet by mouth daily.     Yes Historical Provider, MD  CRESTOR 40 MG tablet TAKE (1) TABLET BY MOUTH AT BEDTIME. 08/29/11  Yes Yehuda Savannah, MD  cyclobenzaprine (FLEXERIL) 10 MG tablet Take 10 mg by mouth 3 (three) times daily as needed.  08/29/11  Yes Historical Provider, MD  diazepam (VALIUM) 10 MG tablet Take 10 mg by mouth every 6 (six) hours as needed.     Yes Historical Provider, MD  docusate sodium (COLACE) 100 MG capsule Take 100 mg by mouth daily.     Yes Historical Provider, MD  esomeprazole (NEXIUM) 40 MG capsule Take 40 mg by mouth daily  before breakfast.     Yes Historical Provider, MD  fenofibrate 160 MG tablet TAKE 1 TABLET BY MOUTH ONCE A DAY WITH MEAL. 11/08/11  Yes Yehuda Savannah, MD  furosemide (LASIX) 40 MG tablet Take 1 tablet (40 mg total) by mouth as needed. 04/29/12  Yes Yehuda Savannah, MD  furosemide (LASIX) 40 MG tablet Take 1 tablet (40 mg total) by mouth as needed. 04/29/12  Yes Yehuda Savannah, MD  glipiZIDE (GLUCOTROL) 10 MG tablet Take 10 mg by mouth once.   Yes Historical Provider, MD  HYDROcodone-acetaminophen (LORCET) 10-650 MG per tablet Take 1 tablet by mouth every 6 (six) hours as needed. For pain    Yes Historical Provider, MD  hyoscyamine (LEVSIN) 0.125 MG/5ML ELIX Take 0.125 mg by mouth every 6 (six) hours as needed.   Yes Historical Provider, MD  Insulin Glargine (LANTUS [place]) Inject 70 Units into the skin every morning.    Yes Historical Provider, MD  insulin NPH-insulin regular (NOVOLIN 70/30) (70-30) 100 UNIT/ML injection Inject 12 Units into the skin 3 (three) times daily. Sliding scale   Yes Historical Provider, MD  lisinopril-hydrochlorothiazide (PRINZIDE,ZESTORETIC) 20-25 MG per tablet Take 1 tablet by mouth daily. Dose increase 07/11/11 07/10/12 Yes Yehuda Savannah, MD  loperamide (IMODIUM) 2 MG capsule Take 2 mg by mouth 4 (four) times daily as needed.   Yes Historical Provider, MD  magnesium oxide (MAG-OX) 400 MG tablet Take 400 mg by mouth daily.     Yes Historical Provider, MD  metoprolol (LOPRESSOR) 50 MG tablet Take 50 mg by mouth 2 (two) times daily.   07/11/11  Yes Yehuda Savannah, MD  Multiple Vitamin (DAILY VITES PO) Take 1 tablet by mouth daily.     Yes Historical Provider, MD  NOVOLOG FLEXPEN 100 UNIT/ML injection  08/14/11  Yes Historical Provider, MD  PARoxetine (PAXIL) 40 MG tablet Take 40 mg by mouth every morning.     Yes Historical Provider, MD  potassium chloride SA (K-DUR,KLOR-CON) 20 MEQ tablet Take 20 mEq by mouth 2 (two) times daily.   Yes Historical Provider, MD    pregabalin (LYRICA) 50 MG capsule Take 50 mg by mouth 3 (three) times daily.     Yes Historical Provider, MD  raloxifene (EVISTA) 60 MG tablet Take 60 mg by mouth daily.   Yes Historical Provider, MD  tobramycin (TOBREX) 0.3 % ophthalmic ointment Place 1 application into the right eye daily.   Yes Historical Provider, MD  traMADol (ULTRAM) 50 MG tablet Take 50 mg by mouth every 6 (six) hours as needed.     Yes Historical Provider, MD   Allergies  Allergen Reactions  . Imodium (Loperamide Hcl) Other (See Comments)    Had a reaction (unknown) but has never taken it again   . Mold Extract (Trichophyton Mentagrophyte)     Mildew, dust  . Niaspan (Niacin) Other (See Comments)    flushing     Past medical history, social history, and family history reviewed and updated.  ROS: Denies cough, sputum production, focal weakness.  All other systems reviewed and are negative.  PHYSICAL EXAM: BP 130/64  Pulse 69  Ht $R'5\' 11"'bP$  (1.803 m)  Wt 118.752 kg (261 lb 12.8 oz)  BMI 36.51 kg/m2  General-Well developed; no acute distress Body habitus-moderately overweight Neck-No JVD; Modest bilateral carotid bruits Lungs-clear lung fields; resonant to percussion; decreased breath sounds at the bases Cardiovascular-normal PMI; normal S1 and S2; modest basilar systolic ejection murmur Abdomen-normal bowel sounds; soft and non-tender without masses or organomegaly Musculoskeletal-No deformities, no cyanosis or clubbing Neurologic-Normal cranial nerves; symmetric strength and tone Skin-Warm, no significant lesions Extremities-distal pulses intact; 1/2+edema  EKG: Normal sinus rhythm; within normal limits.  No previous tracing for comparison.  ASSESSMENT AND PLAN:  Jacqulyn Ducking, MD 06/12/2012 3:26 PM

## 2012-06-12 NOTE — Assessment & Plan Note (Signed)
Modest anemia, likely related to chronic kidney disease.  Appropriate testing should be undertaken if not previously performed.

## 2012-06-13 ENCOUNTER — Other Ambulatory Visit: Payer: Self-pay | Admitting: Cardiology

## 2012-06-13 DIAGNOSIS — I4892 Unspecified atrial flutter: Secondary | ICD-10-CM

## 2012-06-13 DIAGNOSIS — Z9181 History of falling: Secondary | ICD-10-CM

## 2012-07-01 ENCOUNTER — Encounter (HOSPITAL_COMMUNITY): Payer: Medicare Other | Attending: Oncology

## 2012-07-01 DIAGNOSIS — Z9889 Other specified postprocedural states: Secondary | ICD-10-CM | POA: Insufficient documentation

## 2012-07-01 DIAGNOSIS — Z452 Encounter for adjustment and management of vascular access device: Secondary | ICD-10-CM

## 2012-07-01 DIAGNOSIS — C50919 Malignant neoplasm of unspecified site of unspecified female breast: Secondary | ICD-10-CM

## 2012-07-01 DIAGNOSIS — Z95828 Presence of other vascular implants and grafts: Secondary | ICD-10-CM

## 2012-07-01 MED ORDER — HEPARIN SOD (PORK) LOCK FLUSH 100 UNIT/ML IV SOLN
500.0000 [IU] | Freq: Once | INTRAVENOUS | Status: AC
  Administered 2012-07-01: 500 [IU] via INTRAVENOUS
  Filled 2012-07-01: qty 5

## 2012-07-01 MED ORDER — HEPARIN SOD (PORK) LOCK FLUSH 100 UNIT/ML IV SOLN
INTRAVENOUS | Status: AC
  Filled 2012-07-01: qty 5

## 2012-07-01 MED ORDER — SODIUM CHLORIDE 0.9 % IJ SOLN
10.0000 mL | INTRAMUSCULAR | Status: DC | PRN
  Administered 2012-07-01: 10 mL via INTRAVENOUS
  Filled 2012-07-01: qty 10

## 2012-07-01 NOTE — Progress Notes (Signed)
[name] presented for Portacath access and flush. Proper placement of portacath confirmed by CXR. Portacath located lt chest wall accessed with  H 20 needle. Good blood return present. Portacath flushed with 20ml NS and 500U/31ml Heparin and needle removed intact. Procedure without incident. Patient tolerated procedure well.

## 2012-07-07 ENCOUNTER — Encounter (INDEPENDENT_AMBULATORY_CARE_PROVIDER_SITE_OTHER): Payer: Self-pay | Admitting: General Surgery

## 2012-07-07 ENCOUNTER — Ambulatory Visit (INDEPENDENT_AMBULATORY_CARE_PROVIDER_SITE_OTHER): Payer: Medicare Other | Admitting: General Surgery

## 2012-07-07 VITALS — BP 128/76 | HR 72 | Temp 97.4°F | Resp 16 | Ht 71.0 in | Wt 247.4 lb

## 2012-07-07 DIAGNOSIS — C50919 Malignant neoplasm of unspecified site of unspecified female breast: Secondary | ICD-10-CM

## 2012-07-07 NOTE — Patient Instructions (Signed)
Continue regular self exams

## 2012-07-08 ENCOUNTER — Other Ambulatory Visit: Payer: Self-pay | Admitting: *Deleted

## 2012-07-17 ENCOUNTER — Ambulatory Visit: Payer: Medicare Other | Admitting: Cardiology

## 2012-08-02 ENCOUNTER — Other Ambulatory Visit: Payer: Self-pay | Admitting: Cardiology

## 2012-08-05 ENCOUNTER — Telehealth: Payer: Self-pay | Admitting: *Deleted

## 2012-08-05 MED ORDER — METOPROLOL TARTRATE 50 MG PO TABS: 50.0000 mg | ORAL_TABLET | Freq: Two times a day (BID) | ORAL | Status: DC

## 2012-08-05 NOTE — Telephone Encounter (Signed)
Metoprolol 

## 2012-08-11 NOTE — Progress Notes (Signed)
Subjective:     Patient ID: [name], female   DOB: [birth year], 78 y.o.   MRN: [id number]  HPI The pt is a 78 yo wf who is 5 years s/p right mastectomy and axillary lymph node dissection for an inflammatory breast cancer. Her health has been poor over the last few years and she has not had any improvement. She denies much chest wall or breast pain. She had a recent mammogram of her left side that showed no evidence of malignancy  Review of Systems  Constitutional: Positive for fatigue.  HENT: Negative.   Eyes: Negative.   Respiratory: Negative.   Cardiovascular: Negative.   Gastrointestinal: Negative.   Genitourinary: Negative.   Musculoskeletal: Positive for arthralgias and gait problem.  Skin: Negative.   Neurological: Negative.   Hematological: Negative.   Psychiatric/Behavioral: Negative.        Objective:   Physical Exam  Constitutional: She is oriented to person, place, and time. She appears well-developed and well-nourished.  HENT:  Head: Normocephalic and atraumatic.  Eyes: Conjunctivae and EOM are normal. Pupils are equal, round, and reactive to light.  Neck: Normal range of motion. Neck supple.  Cardiovascular: Normal rate, regular rhythm and normal heart sounds.   Pulmonary/Chest: Effort normal and breath sounds normal.       There is no palpable mass of right chest wall. No palpable mass of the left breast. No palpable axillary, supraclavicular, or cervical lymphadenopathy  Abdominal: Soft. Bowel sounds are normal. She exhibits no mass. There is no tenderness.  Musculoskeletal: Normal range of motion.  Lymphadenopathy:    She has no cervical adenopathy.  Neurological: She is alert and oriented to person, place, and time.  Skin: Skin is warm and dry.  Psychiatric: She has a normal mood and affect. Her behavior is normal.       Assessment:     5 years s/p right modified radical mastectomy    Plan:     She will continue to do regular self exams. We will  plan to see her back in a year

## 2012-08-12 ENCOUNTER — Encounter (HOSPITAL_COMMUNITY): Payer: Medicare Other | Attending: Oncology

## 2012-08-12 DIAGNOSIS — Z452 Encounter for adjustment and management of vascular access device: Secondary | ICD-10-CM

## 2012-08-12 DIAGNOSIS — Z95828 Presence of other vascular implants and grafts: Secondary | ICD-10-CM

## 2012-08-12 DIAGNOSIS — C50419 Malignant neoplasm of upper-outer quadrant of unspecified female breast: Secondary | ICD-10-CM

## 2012-08-12 DIAGNOSIS — Z9889 Other specified postprocedural states: Secondary | ICD-10-CM | POA: Insufficient documentation

## 2012-08-12 MED ORDER — HEPARIN SOD (PORK) LOCK FLUSH 100 UNIT/ML IV SOLN
500.0000 [IU] | Freq: Once | INTRAVENOUS | Status: AC
  Administered 2012-08-12: 500 [IU] via INTRAVENOUS
  Filled 2012-08-12: qty 5

## 2012-08-12 MED ORDER — HEPARIN SOD (PORK) LOCK FLUSH 100 UNIT/ML IV SOLN
INTRAVENOUS | Status: AC
  Filled 2012-08-12: qty 5

## 2012-08-12 MED ORDER — SODIUM CHLORIDE 0.9 % IJ SOLN
10.0000 mL | INTRAMUSCULAR | Status: DC | PRN
  Administered 2012-08-12: 10 mL via INTRAVENOUS
  Filled 2012-08-12: qty 10

## 2012-08-12 NOTE — Progress Notes (Signed)
[name] presented for Portacath access and flush. Proper placement of portacath confirmed by CXR. Portacath located lt chest wall accessed with  H 20 needle. Good blood return present. Portacath flushed with 61ml NS and 500U/56ml Heparin and needle removed intact. Procedure without incident. Patient tolerated procedure well.

## 2012-09-23 ENCOUNTER — Encounter (HOSPITAL_COMMUNITY): Payer: Medicare Other

## 2012-11-19 ENCOUNTER — Encounter (HOSPITAL_COMMUNITY): Payer: Medicare Other | Attending: Oncology | Admitting: Oncology

## 2012-11-19 VITALS — BP 148/72 | HR 75 | Temp 98.0°F | Resp 20 | Wt 249.6 lb

## 2012-11-19 DIAGNOSIS — Z853 Personal history of malignant neoplasm of breast: Secondary | ICD-10-CM | POA: Insufficient documentation

## 2012-11-19 DIAGNOSIS — C50419 Malignant neoplasm of upper-outer quadrant of unspecified female breast: Secondary | ICD-10-CM

## 2012-11-19 DIAGNOSIS — C50919 Malignant neoplasm of unspecified site of unspecified female breast: Secondary | ICD-10-CM

## 2012-11-19 DIAGNOSIS — Z09 Encounter for follow-up examination after completed treatment for conditions other than malignant neoplasm: Secondary | ICD-10-CM | POA: Insufficient documentation

## 2012-11-19 DIAGNOSIS — Z171 Estrogen receptor negative status [ER-]: Secondary | ICD-10-CM

## 2012-11-19 LAB — COMPREHENSIVE METABOLIC PANEL
ALT: 10 U/L (ref 0–35)
AST: 14 U/L (ref 0–37)
Alkaline Phosphatase: 55 U/L (ref 39–117)
Calcium: 9 mg/dL (ref 8.4–10.5)
GFR calc Af Amer: 30 mL/min — ABNORMAL LOW (ref >90)
Glucose, Bld: 213 mg/dL — ABNORMAL HIGH (ref 70–99)
Potassium: 4.5 mEq/L (ref 3.5–5.1)
Sodium: 139 mEq/L (ref 135–145)
Total Protein: 6.8 g/dL (ref 6.0–8.3)

## 2012-11-19 LAB — CBC WITH DIFFERENTIAL/PLATELET
Basophils Absolute: 0.1 10*3/uL (ref 0.0–0.1)
Eosinophils Absolute: 0.3 10*3/uL (ref 0.0–0.7)
Eosinophils Relative: 6 % — ABNORMAL HIGH (ref 0–5)
Lymphocytes Relative: 43 % (ref 12–46)
Lymphs Abs: 2.5 10*3/uL (ref 0.7–4.0)
MCH: 28.6 pg (ref 26.0–34.0)
Neutrophils Relative %: 45 % (ref 43–77)
Platelets: 204 10*3/uL (ref 150–400)
RBC: 3.98 MIL/uL (ref 3.87–5.11)
RDW: 13.8 % (ref 11.5–15.5)
WBC: 5.7 10*3/uL (ref 4.0–10.5)

## 2012-11-19 MED ORDER — SODIUM CHLORIDE 0.9 % IJ SOLN
10.0000 mL | INTRAMUSCULAR | Status: DC | PRN
  Administered 2012-11-19: 10 mL via INTRAVENOUS
  Filled 2012-11-19: qty 10

## 2012-11-19 MED ORDER — SODIUM CHLORIDE 0.9 % IJ SOLN
INTRAMUSCULAR | Status: AC
  Filled 2012-11-19: qty 20

## 2012-11-19 MED ORDER — HEPARIN SOD (PORK) LOCK FLUSH 100 UNIT/ML IV SOLN
500.0000 [IU] | Freq: Once | INTRAVENOUS | Status: AC
  Administered 2012-11-19: 500 [IU] via INTRAVENOUS
  Filled 2012-11-19: qty 5

## 2012-11-19 MED ORDER — SODIUM CHLORIDE 0.9 % IJ SOLN
INTRAMUSCULAR | Status: AC
  Filled 2012-11-19: qty 10

## 2012-11-19 MED ORDER — HEPARIN SOD (PORK) LOCK FLUSH 100 UNIT/ML IV SOLN
INTRAVENOUS | Status: AC
  Filled 2012-11-19: qty 5

## 2012-11-19 NOTE — Progress Notes (Signed)
Problem #1 inflammatory carcinoma right breast with her initial biopsy on 10/22/2006. This was an ER, PR, negative cancer but HER-2/neu 3+ positive. She was treated with FEC x4 cycles followed by surgery. We then treated with Taxotere navelbine along with Herceptin the latter for 52 weeks. She was able to tolerate only 2 cycles of Taxotere navelbine of a planned 4 cycles. Should very slow wound healing but did eventually heal. She declined radiation therapy however but finished all of her Herceptin.  Oncologic review of systems today is negative. She is active feeling better. She's actually been able to contact and be in touch with a daughter that had to be given up for adoption when she was 78 years old. This is actually been very difficult painful experience for her but I think there is some retention that she can achieve with this contact which will take place in real life and in person after Christmas.  Her vital signs are stable. She remains very overweight for her height. Lymph nodes remain negative throughout. The right chest wall is clear. The left breast is negative and pendulous. Lungs are clear. Heart shows no S3 gallop or murmur she thought she had edema in the right axillary area but that is just postoperative changes and he feels funny to her because of the neurological changes from the surgery. She has no pitting leg edema, just puffiness around the ankles. Abdomen remains obese nontender without organomegaly. Her labs are pending from today. We will see her in 6 months she has no evidence for disease recurrence this time

## 2012-11-19 NOTE — Progress Notes (Signed)
[name] presented for MD visit, labs, and Portacath access and flush.  Proper placement of portacath confirmed by CXR.  Portacath located left chest wall accessed with  H 20 needle.  Sluggish blood return and Portacath flushed with 67ml NS and 500U/57ml Heparin and needle removed intact.  Venipuncture performed to left AC and all procedures tolerated well and without incident.

## 2012-11-19 NOTE — Patient Instructions (Signed)
[place] Discharge Instructions  RECOMMENDATIONS MADE BY THE CONSULTANT AND ANY TEST RESULTS WILL BE SENT TO YOUR REFERRING PHYSICIAN.  EXAM FINDINGS BY THE PHYSICIAN TODAY AND SIGNS OR SYMPTOMS TO REPORT TO CLINIC OR PRIMARY PHYSICIAN: Exam findings as discussed by Dr. Tressie Stalker.  SPECIAL INSTRUCTIONS/FOLLOW-UP: 1.  You had labs performed today, and we will contact you if results are abnormal. 2.  Return in 6 months to see T. Kefalas, PA-C.  Thank you for choosing [place] to provide your oncology and hematology care.  To afford each patient quality time with our providers, please arrive at least 15 minutes before your scheduled appointment time.  With your help, our goal is to use those 15 minutes to complete the necessary work-up to ensure our physicians have the information they need to help with your evaluation and healthcare recommendations.    Effective January 1st, 2014, we ask that you re-schedule your appointment with our physicians should you arrive 10 or more minutes late for your appointment.  We strive to give you quality time with our providers, and arriving late affects you and other patients whose appointments are after yours.    Again, thank you for choosing [facility].  Our hope is that these requests will decrease the amount of time that you wait before being seen by our physicians.       _____________________________________________________________  I acknowledge that I have been informed and understand all the instructions given to me and received a copy. I do not have anymore questions at this time but understand that I may call the [place] at [facility] at [phone] during business hours should I have any further questions or need assistance in obtaining follow-up care.    __________________________________________  _____________  __________ Signature of Patient or Authorized Representative             Date                   Time    __________________________________________ Nurse's Signature

## 2012-11-21 ENCOUNTER — Other Ambulatory Visit: Payer: Self-pay | Admitting: Cardiology

## 2013-04-10 ENCOUNTER — Other Ambulatory Visit (HOSPITAL_COMMUNITY): Payer: Self-pay | Admitting: Oncology

## 2013-04-10 DIAGNOSIS — Z139 Encounter for screening, unspecified: Secondary | ICD-10-CM

## 2013-05-18 ENCOUNTER — Ambulatory Visit (HOSPITAL_COMMUNITY): Payer: Medicare Other

## 2013-05-20 ENCOUNTER — Ambulatory Visit (HOSPITAL_COMMUNITY): Payer: Medicare Other | Admitting: Oncology

## 2013-05-20 NOTE — Progress Notes (Signed)
Patient rescheduled

## 2013-05-27 ENCOUNTER — Encounter (INDEPENDENT_AMBULATORY_CARE_PROVIDER_SITE_OTHER): Payer: Self-pay | Admitting: General Surgery

## 2013-06-14 NOTE — Progress Notes (Signed)
Leonides Grills, MD [address] [place] [address]  Inflammatory carcinoma of breast, right  CURRENT THERAPY: Observation  INTERVAL HISTORY: [name] 79 y.o. 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 This was an ER, PR, negative cancer but HER-2/neu 3+ positive. She was treated with FEC x4 cycles followed by surgery. We then treated with Taxotere navelbine along with Herceptin the latter for 52 weeks. She was able to tolerate only 2 cycles of Taxotere navelbine of a planned 4 cycles. She declined radiation therapy however but finished all of her Herceptin.  I personally reviewed and went over laboratory results with the patient.  We reviewed her old labs from December 2014.   I personally reviewed and went over radiographic studies with the patient.  Mammogram on 05/19/2012 ws BIRADS category 1.  She is overdue for her next screening mammogram for 2014. She reports that she forgot about her appointment and it is been having it performed this month or next month.  She does admit to bilateral lower extremity edema and I suspect this dependent edema. She does sit around quite a bit at home and is not very active. I'll defer evaluation and treatment of this to her primary care physician. It is bilateral without any erythema or heat.  She is planning on having laboratory work for Dr. Dorris Fetch and have requested that the lab results get faxed to our clinic. Therefore, we'll not perform laboratory work here today.  She asked for a prescription for mastectomy prosthesis and bras and the prescription was written and provided to her today.  She asks how long we need to follow her for and I suggested approximate 10 year time frame which would be around 2017 or 2018. She admits that she doesn't see her primary care physician often, and that is one thing that we'll have to work on before  we release her. She will require her mammograms yearly and regular breast exams and we would prefer that she be set up with her primary care physician once we release her.  Oncologically, the patient denies any complaints of ROS questioning is negative.  On physical exam, the left inframammary fold is appreciated to be moist and cornstarch was recommended for her to prevent Candida infection.  Past Medical History  Diagnosis Date  . Breast carcinoma   . Anemia   . IBS (irritable bowel syndrome)     diverticulosis, gastroesophageal reflux disease  . Hypertension   . Asthma   . Diabetes mellitus   . Hyperlipidemia   . Arthritis   . Headaches, cluster   . Lower back pain   . Aortic stenosis     Mild  . Chronic kidney disease     Creatinine-1.5 in 2010 and 2.5-3 in 2011; 1.5-10/2010;  Klebsiella UTI-10/2010. urine protein 36 mg/dl, mildly elevated  . Depression   . Fibromyalgia   . Cerebrovascular disease     with a 14% LICA; repeat study in 10/2009-no obstructive disease; modest ASVD  . Falls infrequently 01/2010    fracture of pelvis and left humerus  . Atrial flutter 2002  . DJD (degenerative joint disease) 11/14/2011  . Obesity 11/14/2011  . Diabetic retinopathy   . Decreased bone density     has DIABETES MELLITUS, TYPE II; HYPERLIPIDEMIA; DEPRESSION/ANXIETY; Atrial flutter; Orthostatic hypotension; SPINAL STENOSIS, LUMBAR; Inflammatory carcinoma of right breast; Anemia; IBS (irritable bowel syndrome);  Hypertension; Aortic stenosis; Chronic kidney disease; Fibromyalgia; Cerebrovascular disease; Falls infrequently; DJD (degenerative joint disease); and Obesity on her problem list.     is allergic to imodium; mold extract; and niaspan.  Ms. Hannon had no medications administered during this visit.  Past Surgical History  Procedure Laterality Date  . Mastectomy      Right breast for carcinoma  . Lumbar disc surgery      lumbosacral spine procedure x 2  . Appendectomy    .  Cholecystectomy    . Cataract extraction, bilateral      Lens implants  . Central venous catheter tunneled insertion single lumen    . Colonoscopy  2012  . Eye surgery  nov 2012    for diabetic retinopathy    Denies any headaches, dizziness, double vision, fevers, chills, night sweats, nausea, vomiting, diarrhea, constipation, chest pain, heart palpitations, shortness of breath, blood in stool, black tarry stool, urinary pain, urinary burning, urinary frequency, hematuria.   PHYSICAL EXAMINATION  ECOG PERFORMANCE STATUS: 3 - Symptomatic, >50% confined to bed  Filed Vitals:   06/15/13 1336  BP: 131/57  Pulse: 65  Temp: 97.3 F (36.3 C)  Resp: 18    GENERAL:alert, no distress, well nourished, well developed, comfortable, cooperative, obese and smiling SKIN: skin color, texture, turgor are normal, no rashes or significant lesions HEAD: Normocephalic, No masses, lesions, tenderness or abnormalities EYES: normal, PERRLA, EOMI, Conjunctiva are pink and non-injected EARS: External ears normal OROPHARYNX:mucous membranes are moist  NECK: supple, no adenopathy, thyroid normal size, non-tender, without nodularity, no stridor, non-tender, trachea midline LYMPH:  no palpable lymphadenopathy, no hepatosplenomegaly BREAST:left breast normal without mass, skin or nipple changes or axillary nodes with moisture in the inframammary fold, right post-mastectomy site well healed and free of suspicious changes LUNGS: clear to auscultation and percussion HEART: regular rate & rhythm, no murmurs, no gallops, S1 normal and S2 normal ABDOMEN:abdomen soft, non-tender, obese and normal bowel sounds BACK: Back symmetric, no curvature., No CVA tenderness EXTREMITIES:less then 2 second capillary refill, no joint deformities, effusion, or inflammation, no skin discoloration, no clubbing, no cyanosis, positive findings:  edema trace to 1+ pitting edema pretibially.   NEURO: alert & oriented x 3 with fluent  speech, no focal motor/sensory deficits, in wheelchair    LABORATORY DATA: CBC    Component Value Date/Time   WBC 5.7 11/19/2012 1345   RBC 3.98 11/19/2012 1345   HGB 11.4* 11/19/2012 1345   HCT 36.0 11/19/2012 1345   PLT 204 11/19/2012 1345   MCV 90.5 11/19/2012 1345   MCH 28.6 11/19/2012 1345   MCHC 31.7 11/19/2012 1345   RDW 13.8 11/19/2012 1345   LYMPHSABS 2.5 11/19/2012 1345   MONOABS 0.3 11/19/2012 1345   EOSABS 0.3 11/19/2012 1345   BASOSABS 0.1 11/19/2012 1345      Chemistry      Component Value Date/Time   NA 139 11/19/2012 1345   K 4.5 11/19/2012 1345   CL 105 11/19/2012 1345   CO2 25 11/19/2012 1345   BUN 29* 11/19/2012 1345   CREATININE 1.79* 11/19/2012 1345   CREATININE 1.36* 07/26/2011 0910      Component Value Date/Time   CALCIUM 9.0 11/19/2012 1345   ALKPHOS 55 11/19/2012 1345   AST 14 11/19/2012 1345   ALT 10 11/19/2012 1345   BILITOT 0.1* 11/19/2012 1345       RADIOGRAPHIC STUDIES:  *RADIOLOGY REPORT*  Clinical Data: Screening.  LEFT DIGITAL SCREENING MAMMOGRAM WITH CAD  Comparison: None  Findings: The patient has had a right mastectomy. Views of the  left breast demonstrate heterogeneously dense tissue . No  suspicious masses, architectural distortion, or calcifications are  present.  Images were processed with CAD.  IMPRESSION:  No evidence of malignancy. Screening mammography is recommended in  one year.  RECOMMENDATION:  Screening mammogram in one year. (Code:SM-B-01Y)  BI-RADS CATEGORY 1: Negative  Original Report Authenticated By: Gerald Stabs, M.D.    ASSESSMENT:  1.  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 This was an ER, PR, negative cancer but HER-2/neu 3+ positive. She was treated with FEC x4 cycles followed by surgery. We then treated with Taxotere navelbine along with Herceptin the latter for 52 weeks. She was able to tolerate only 2 cycles of Taxotere navelbine of a planned 4 cycles.  She declined radiation therapy however but finished all of her Herceptin. 2. Left inframammary fold moisture  Patient Active Problem List   Diagnosis Date Noted  . DJD (degenerative joint disease) 11/14/2011  . Obesity 11/14/2011  . Inflammatory carcinoma of right breast   . Anemia   . IBS (irritable bowel syndrome)   . Hypertension   . Aortic stenosis   . Chronic kidney disease   . Fibromyalgia   . Cerebrovascular disease   . Atrial flutter 07/14/2010  . Orthostatic hypotension 04/13/2010  . Falls infrequently 01/31/2010  . HYPERLIPIDEMIA 11/15/2009  . DIABETES MELLITUS, TYPE II 10/12/2009  . DEPRESSION/ANXIETY 10/12/2009  . SPINAL STENOSIS, LUMBAR 10/12/2009      PLAN: 1. I personally reviewed and went over laboratory results with the patient. 2. I personally reviewed and went over radiographic studies with the patient. 3. Patient reminded that she is overdue for her mammogram and it was recommended that she have one performed this month.  4. She will have labs performed by Dr. Dorris Fetch.  Request copies be sent to [facility] [phone]). 5. Follow-up with Dr. Dorris Fetch as directed 6. Follow-up with PCP as directed 7. Recommended cornstarch application to left inframammary fold PRN 8. Rx for right bras and prostheses. 9. Return in 6 months for follow-up   THERAPY PLAN:  We'll continue observation of the patient until approximately 2017 or 2018. At that time we may consider releasing the patient from the clinic as long as she is involved with her primary care physician who is willing to perform annual examinations including a breast examination. We'll see her back in 6 months for followup.  All questions were answered. The patient knows to call the clinic with any problems, questions or concerns. We can certainly see the patient much sooner if necessary.  Patient and plan discussed with Dr. Orlene Och and he is in agreement with the aforementioned.    KEFALAS,THOMAS

## 2013-06-15 ENCOUNTER — Encounter (HOSPITAL_COMMUNITY): Payer: Self-pay | Admitting: Oncology

## 2013-06-15 ENCOUNTER — Encounter (HOSPITAL_COMMUNITY): Payer: Medicare Other | Attending: Oncology | Admitting: Oncology

## 2013-06-15 VITALS — BP 131/57 | HR 65 | Temp 97.3°F | Resp 18 | Wt 259.1 lb

## 2013-06-15 DIAGNOSIS — C50911 Malignant neoplasm of unspecified site of right female breast: Secondary | ICD-10-CM

## 2013-06-15 DIAGNOSIS — C50919 Malignant neoplasm of unspecified site of unspecified female breast: Secondary | ICD-10-CM

## 2013-06-15 NOTE — Patient Instructions (Addendum)
[employer] Discharge Instructions  RECOMMENDATIONS MADE BY THE CONSULTANT AND ANY TEST RESULTS WILL BE SENT TO YOUR REFERRING PHYSICIAN.  EXAM FINDINGS BY THE PHYSICIAN TODAY AND SIGNS OR SYMPTOMS TO REPORT TO CLINIC OR PRIMARY PHYSICIAN: Exam and discussion by PA.  Try to get your mammogram within the next month or so.  Use a light dusting of corn starch underneath your left breast.  You want to keep any areas of skin folds clean and dry.  When you get your next blood work done ask your doctor to fax the results to [place].  Our fax number is [phone].  MEDICATIONS PRESCRIBED:  none  INSTRUCTIONS GIVEN AND DISCUSSED: Report any new lumps, bone pain, shortness of breath or other symptoms.  SPECIAL INSTRUCTIONS/FOLLOW-UP: Follow-up in 6 months.  Thank you for choosing [place] to provide your oncology and hematology care.  To afford each patient quality time with our providers, please arrive at least 15 minutes before your scheduled appointment time.  With your help, our goal is to use those 15 minutes to complete the necessary work-up to ensure our physicians have the information they need to help with your evaluation and healthcare recommendations.    Effective January 1st, 2014, we ask that you re-schedule your appointment with our physicians should you arrive 10 or more minutes late for your appointment.  We strive to give you quality time with our providers, and arriving late affects you and other patients whose appointments are after yours.    Again, thank you for choosing [facility].  Our hope is that these requests will decrease the amount of time that you wait before being seen by our physicians.       _____________________________________________________________  Should you have questions after your visit to [facility], please contact our office at [phone] between the hours of 8:30 a.m. and 5:00 p.m.  Voicemails  left after 4:30 p.m. will not be returned until the following business day.  For prescription refill requests, have your pharmacy contact our office with your prescription refill request.

## 2013-06-17 ENCOUNTER — Ambulatory Visit: Payer: Medicare Other | Admitting: Cardiology

## 2013-07-02 ENCOUNTER — Other Ambulatory Visit: Payer: Self-pay | Admitting: *Deleted

## 2013-07-29 ENCOUNTER — Ambulatory Visit: Payer: Medicare Other | Admitting: Cardiology

## 2013-08-03 ENCOUNTER — Other Ambulatory Visit: Payer: Self-pay | Admitting: Cardiology

## 2013-08-25 ENCOUNTER — Encounter: Payer: Self-pay | Admitting: *Deleted

## 2013-09-03 ENCOUNTER — Encounter (INDEPENDENT_AMBULATORY_CARE_PROVIDER_SITE_OTHER): Payer: Self-pay

## 2013-09-04 ENCOUNTER — Other Ambulatory Visit: Payer: Self-pay | Admitting: Cardiology

## 2013-09-04 NOTE — Telephone Encounter (Signed)
Needs appointment before refill.

## 2013-09-16 ENCOUNTER — Encounter: Payer: Self-pay | Admitting: Adult Health

## 2013-09-16 ENCOUNTER — Encounter (INDEPENDENT_AMBULATORY_CARE_PROVIDER_SITE_OTHER): Payer: Medicare Other | Admitting: Adult Health

## 2013-09-16 NOTE — Progress Notes (Signed)
HPI: [name] is a 79 year old former patient of Dr. Lattie Beard we are following with known history of atrial flutter, hypertension, and aortic valve stenosis, the patient has multiple medical problems. The patient has occasional lower extremity edema for which she uses Lasix when necessary. She was last seen by Dr. Lattie Beard in July of 2013.  Allergies  Allergen Reactions  . Imodium [Loperamide Hcl] Other (See Comments)    Had a reaction (unknown) but has never taken it again   . Mold Extract [Trichophyton Mentagrophyte]     Mildew, dust  . Niaspan [Niacin] Other (See Comments)    flushing    Current Outpatient Prescriptions  Medication Sig Dispense Refill  . ACCU-CHEK AVIVA PLUS test strip       . albuterol (PROAIR HFA) 108 (90 BASE) MCG/ACT inhaler Inhale 2 puffs into the lungs every 6 (six) hours as needed.       . Artificial Tear Solution (TEARS NATURALE II) SOLN Apply to eye as needed.        Marland Kitchen aspirin (ASPIR-LOW) 81 MG EC tablet Take 81 mg by mouth daily.        . Calcium Carb-Cholecalciferol (CALCIUM 1000 + D PO) Take 1 tablet by mouth daily.        . CRESTOR 40 MG tablet TAKE (1) TABLET BY MOUTH AT BEDTIME.  30 tablet  11  . cyclobenzaprine (FLEXERIL) 10 MG tablet Take 10 mg by mouth 3 (three) times daily as needed.       . diazepam (VALIUM) 10 MG tablet Take 10 mg by mouth every 6 (six) hours as needed.        . docusate sodium (COLACE) 100 MG capsule Take 100 mg by mouth daily.        Marland Kitchen esomeprazole (NEXIUM) 40 MG capsule Take 40 mg by mouth daily before breakfast.        . fenofibrate 160 MG tablet TAKE 1 TABLET BY MOUTH ONCE A DAY WITH MEAL.  30 tablet  9  . furosemide (LASIX) 40 MG tablet Take 1 tablet (40 mg total) by mouth as needed.  30 tablet  2  . glipiZIDE (GLUCOTROL) 10 MG tablet Take 10 mg by mouth once.      . hydrochlorothiazide (MICROZIDE) 12.5 MG capsule 12.5 mg daily.       Marland Kitchen HYDROcodone-acetaminophen (LORCET) 10-650 MG per tablet Take 1 tablet by mouth  every 6 (six) hours as needed. Takes 2 every morning      . hyoscyamine (LEVSIN) 0.125 MG/5ML ELIX Take 0.125 mg by mouth every 6 (six) hours as needed.      . Insulin Glargine (LANTUS [place]) Inject 60 Units into the skin every morning.       Marland Kitchen lisinopril-hydrochlorothiazide (PRINZIDE,ZESTORETIC) 20-25 MG per tablet TAKE 1 TABLET BY MOUTH ONCE A DAY.  30 tablet  0  . loperamide (IMODIUM) 2 MG capsule Take 2 mg by mouth 4 (four) times daily as needed.      . metoprolol (LOPRESSOR) 50 MG tablet Take 1 tablet (50 mg total) by mouth 2 (two) times daily.  60 tablet  6  . Multiple Vitamin (DAILY VITES PO) Take 1 tablet by mouth daily.        Marland Kitchen NOVOLOG FLEXPEN 100 UNIT/ML injection On sliding scale 4 times daily      . PARoxetine (PAXIL) 40 MG tablet Take 40 mg by mouth every morning.        . potassium chloride SA (K-DUR,KLOR-CON) 20 MEQ  tablet Take 20 mEq by mouth 2 (two) times daily.      . pregabalin (LYRICA) 50 MG capsule Take 50 mg by mouth 3 (three) times daily.        . raloxifene (EVISTA) 60 MG tablet Take 60 mg by mouth daily.      . traMADol (ULTRAM) 50 MG tablet Take 50 mg by mouth every 6 (six) hours as needed.         No current facility-administered medications for this visit.    Past Medical History  Diagnosis Date  . Breast carcinoma   . Anemia   . IBS (irritable bowel syndrome)     diverticulosis, gastroesophageal reflux disease  . Hypertension   . Asthma   . Diabetes mellitus   . Hyperlipidemia   . Arthritis   . Headaches, cluster   . Lower back pain   . Aortic stenosis     Mild  . Chronic kidney disease     Creatinine-1.5 in 2010 and 2.5-3 in 2011; 1.5-10/2010;  Klebsiella UTI-10/2010. urine protein 36 mg/dl, mildly elevated  . Depression   . Fibromyalgia   . Cerebrovascular disease     with a 53% LICA; repeat study in 10/2009-no obstructive disease; modest ASVD  . Falls infrequently 01/2010    fracture of pelvis and left humerus  . Atrial flutter 2002  . DJD  (degenerative joint disease) 11/14/2011  . Obesity 11/14/2011  . Diabetic retinopathy   . Decreased bone density     Past Surgical History  Procedure Laterality Date  . Mastectomy      Right breast for carcinoma  . Lumbar disc surgery      lumbosacral spine procedure x 2  . Appendectomy    . Cholecystectomy    . Cataract extraction, bilateral      Lens implants  . Central venous catheter tunneled insertion single lumen    . Colonoscopy  2012  . Eye surgery  nov 2012    for diabetic retinopathy    ROS: PHYSICAL EXAM There were no vitals taken for this visit.  EKG:  ASSESSMENT AND PLAN

## 2013-09-23 ENCOUNTER — Other Ambulatory Visit (HOSPITAL_COMMUNITY): Payer: Self-pay | Admitting: Oncology

## 2013-09-23 DIAGNOSIS — Z139 Encounter for screening, unspecified: Secondary | ICD-10-CM

## 2013-09-24 ENCOUNTER — Encounter: Payer: Self-pay | Admitting: Cardiovascular Disease

## 2013-09-24 ENCOUNTER — Ambulatory Visit (INDEPENDENT_AMBULATORY_CARE_PROVIDER_SITE_OTHER): Payer: Medicare Other | Admitting: Cardiovascular Disease

## 2013-09-24 VITALS — BP 156/69 | HR 58 | Ht 69.0 in | Wt 247.0 lb

## 2013-09-24 DIAGNOSIS — I10 Essential (primary) hypertension: Secondary | ICD-10-CM

## 2013-09-24 DIAGNOSIS — Z23 Encounter for immunization: Secondary | ICD-10-CM

## 2013-09-24 DIAGNOSIS — E785 Hyperlipidemia, unspecified: Secondary | ICD-10-CM

## 2013-09-24 DIAGNOSIS — I350 Nonrheumatic aortic (valve) stenosis: Secondary | ICD-10-CM

## 2013-09-24 DIAGNOSIS — Z9181 History of falling: Secondary | ICD-10-CM

## 2013-09-24 DIAGNOSIS — I4892 Unspecified atrial flutter: Secondary | ICD-10-CM

## 2013-09-24 DIAGNOSIS — I359 Nonrheumatic aortic valve disorder, unspecified: Secondary | ICD-10-CM

## 2013-09-24 MED ORDER — LISINOPRIL 20 MG PO TABS: 20.0000 mg | ORAL_TABLET | Freq: Every evening | ORAL | Status: DC

## 2013-09-24 NOTE — Patient Instructions (Signed)
Your physician recommends that you schedule a follow-up appointment in: 1 year with Dr Virgina Jock will receive a reminder letter two months in advance reminding you to call and schedule your appointment. If you don't receive this letter, please contact our office.  Your physician has recommended you make the following change in your medication:  Start lisinopril 20 mg daily every evening with the Lisinopril-HCTZ

## 2013-09-24 NOTE — Progress Notes (Signed)
Patient ID: [name], female   DOB: 1933/05/31, 79 y.o.   MRN: [id number]      SUBJECTIVE: [name] has a h/o inflammatory carcinoma of the right breast (treated with chemotherapy, refused radiation therapy), mild aortic stenosis, remote h/o atrial flutter, HTN, hyperlipidemia, diabetes mellitus, and CKD. She has mild intermittent pedal edema and a sense of fluid retention for which she uses furosemide as needed, which she hasn't had to use in quite some time. Due to dizziness and a h/o falls, she uses a wheelchair to get around.  She denies chest pain, shortness of breath, and palpitations.      Allergies  Allergen Reactions  . Imodium [Loperamide Hcl] Other (See Comments)    Had a reaction (unknown) but has never taken it again   . Mold Extract [Trichophyton Mentagrophyte]     Mildew, dust  . Niaspan [Niacin] Other (See Comments)    flushing    Current Outpatient Prescriptions  Medication Sig Dispense Refill  . ACCU-CHEK AVIVA PLUS test strip       . albuterol (PROAIR HFA) 108 (90 BASE) MCG/ACT inhaler Inhale 2 puffs into the lungs every 6 (six) hours as needed.       . Artificial Tear Solution (TEARS NATURALE II) SOLN Apply to eye as needed.        Marland Kitchen aspirin (ASPIR-LOW) 81 MG EC tablet Take 81 mg by mouth daily.        . Calcium Carb-Cholecalciferol (CALCIUM 1000 + D PO) Take 1 tablet by mouth daily.        . CRESTOR 40 MG tablet TAKE (1) TABLET BY MOUTH AT BEDTIME.  30 tablet  11  . cyclobenzaprine (FLEXERIL) 10 MG tablet Take 10 mg by mouth 3 (three) times daily as needed.       . diazepam (VALIUM) 10 MG tablet Take 10 mg by mouth every 6 (six) hours as needed.        . docusate sodium (COLACE) 100 MG capsule Take 100 mg by mouth daily.        Marland Kitchen esomeprazole (NEXIUM) 40 MG capsule Take 40 mg by mouth daily before breakfast.        . fenofibrate 160 MG tablet TAKE 1 TABLET BY MOUTH ONCE A DAY WITH MEAL.  30 tablet  9  . furosemide (LASIX) 40 MG tablet Take 1 tablet (40 mg  total) by mouth as needed.  30 tablet  2  . glipiZIDE (GLUCOTROL) 10 MG tablet Take 10 mg by mouth once.      . hydrochlorothiazide (MICROZIDE) 12.5 MG capsule 12.5 mg daily.       Marland Kitchen HYDROcodone-acetaminophen (LORCET) 10-650 MG per tablet Take 1 tablet by mouth every 6 (six) hours as needed. Takes 2 every morning      . hyoscyamine (LEVSIN) 0.125 MG/5ML ELIX Take 0.125 mg by mouth every 6 (six) hours as needed.      . Insulin Glargine (LANTUS [place]) Inject 60 Units into the skin every morning.       Marland Kitchen lisinopril-hydrochlorothiazide (PRINZIDE,ZESTORETIC) 20-25 MG per tablet TAKE 1 TABLET BY MOUTH ONCE A DAY.  30 tablet  0  . loperamide (IMODIUM) 2 MG capsule Take 2 mg by mouth 4 (four) times daily as needed.      . metoprolol (LOPRESSOR) 50 MG tablet Take 1 tablet (50 mg total) by mouth 2 (two) times daily.  60 tablet  6  . Multiple Vitamin (DAILY VITES PO) Take 1 tablet by mouth daily.        Marland Kitchen  NOVOLOG FLEXPEN 100 UNIT/ML injection On sliding scale 4 times daily      . PARoxetine (PAXIL) 40 MG tablet Take 40 mg by mouth every morning.        . potassium chloride SA (K-DUR,KLOR-CON) 20 MEQ tablet Take 20 mEq by mouth 2 (two) times daily.      . pregabalin (LYRICA) 50 MG capsule Take 50 mg by mouth 3 (three) times daily.        . raloxifene (EVISTA) 60 MG tablet Take 60 mg by mouth daily.      . traMADol (ULTRAM) 50 MG tablet Take 50 mg by mouth every 6 (six) hours as needed.         No current facility-administered medications for this visit.    Past Medical History  Diagnosis Date  . Breast carcinoma   . Anemia   . IBS (irritable bowel syndrome)     diverticulosis, gastroesophageal reflux disease  . Hypertension   . Asthma   . Diabetes mellitus   . Hyperlipidemia   . Arthritis   . Headaches, cluster   . Lower back pain   . Aortic stenosis     Mild  . Chronic kidney disease     Creatinine-1.5 in 2010 and 2.5-3 in 2011; 1.5-10/2010;  Klebsiella UTI-10/2010. urine protein 36 mg/dl,  mildly elevated  . Depression   . Fibromyalgia   . Cerebrovascular disease     with a 60% LICA; repeat study in 10/2009-no obstructive disease; modest ASVD  . Falls infrequently 01/2010    fracture of pelvis and left humerus  . Atrial flutter 2002  . DJD (degenerative joint disease) 11/14/2011  . Obesity 11/14/2011  . Diabetic retinopathy   . Decreased bone density     Past Surgical History  Procedure Laterality Date  . Mastectomy      Right breast for carcinoma  . Lumbar disc surgery      lumbosacral spine procedure x 2  . Appendectomy    . Cholecystectomy    . Cataract extraction, bilateral      Lens implants  . Central venous catheter tunneled insertion single lumen    . Colonoscopy  2012  . Eye surgery  nov 2012    for diabetic retinopathy    History   Social History  . Marital Status: Widowed    Spouse Name: N/A    Number of Children: 2  . Years of Education: N/A   Occupational History  . On disability    Social History Main Topics  . Smoking status: Former Games developer  . Smokeless tobacco: Never Used  . Alcohol Use: No  . Drug Use: No  . Sexual Activity: Not on file   Other Topics Concern  . Not on file   Social History Narrative   Lives alone    BP: 156/69  Pulse: 58   PHYSICAL EXAM General: NAD, obese, in a wheelchair Neck: No JVD, no thyromegaly or thyroid nodule.  Lungs: Clear to auscultation bilaterally with normal respiratory effort. CV: Nondisplaced PMI.  Heart regular S1/S2, no S3/S4, no murmur.  Trace peripheral edema.  No carotid bruit.  Normal pedal pulses.  Abdomen: Soft, nontender, no hepatosplenomegaly, no distention.  Neurologic: Alert and oriented x 3.  Psych: Normal affect. Extremities: No clubbing or cyanosis.   ECG: reviewed and available in electronic records. (normal sinus rhythm, 60 bpm)      ASSESSMENT AND PLAN: 1. HTN: uncontrolled on current therapy, which includes lisinopril-HCTZ and metoprolol. I will increase  the  lisinopril component to 40 mg. 2. Hyperlipidemia: on Crestor. 3. Aortic stenosis: symptomatically stable. No murmur appreciated. No indication for noninvasive testing at this time. 4. Remote h/o atrial flutter: currently in sinus rhythm.   Kate Sable, M.D., F.A.C.C.

## 2013-09-25 NOTE — Addendum Note (Signed)
Addended by: Karlyne Greenspan C on: 09/25/2013 10:40 AM   Modules accepted: Orders

## 2013-09-30 ENCOUNTER — Encounter (INDEPENDENT_AMBULATORY_CARE_PROVIDER_SITE_OTHER): Payer: Self-pay | Admitting: General Surgery

## 2013-09-30 ENCOUNTER — Ambulatory Visit (INDEPENDENT_AMBULATORY_CARE_PROVIDER_SITE_OTHER): Payer: Medicare Other | Admitting: General Surgery

## 2013-09-30 VITALS — BP 142/84 | HR 64 | Resp 16 | Ht 69.0 in | Wt 246.4 lb

## 2013-09-30 DIAGNOSIS — C50919 Malignant neoplasm of unspecified site of unspecified female breast: Secondary | ICD-10-CM

## 2013-09-30 DIAGNOSIS — C50911 Malignant neoplasm of unspecified site of right female breast: Secondary | ICD-10-CM

## 2013-09-30 NOTE — Patient Instructions (Signed)
Continue regular self exams

## 2013-09-30 NOTE — Progress Notes (Signed)
Subjective:     Patient ID: [name], female   DOB: August 02, 1933, 79 y.o.   MRN: [id number]  HPI The patient is a 79 year old white female who is 6 years status post right modified radical mastectomy for an inflammatory breast cancer. She has been reasonably well since her last visit and denies any major medical illnesses. She denies any chest pain. She is scheduled for her next mammogram this Friday.  Review of Systems  Constitutional: Positive for fatigue.  HENT: Negative.   Eyes: Negative.   Respiratory: Positive for chest tightness.   Cardiovascular: Negative.   Gastrointestinal: Negative.   Endocrine: Negative.   Genitourinary: Negative.   Musculoskeletal: Positive for arthralgias.  Skin: Negative.   Allergic/Immunologic: Negative.   Neurological: Negative.   Hematological: Negative.   Psychiatric/Behavioral: Negative.        Objective:   Physical Exam  Constitutional: She is oriented to person, place, and time. She appears well-developed and well-nourished.  HENT:  Head: Normocephalic and atraumatic.  Eyes: Conjunctivae and EOM are normal. Pupils are equal, round, and reactive to light.  Neck: Normal range of motion. Neck supple.  Cardiovascular: Normal rate, regular rhythm and normal heart sounds.   Pulmonary/Chest: Effort normal and breath sounds normal.  There is no palpable mass of the right chest wall. There is no palpable mass of the left breast. There is no palpable axillary, supraclavicular, or cervical lymphadenopathy  Abdominal: Soft. Bowel sounds are normal. She exhibits no mass. There is no tenderness.  Musculoskeletal: Normal range of motion.  Lymphadenopathy:    She has no cervical adenopathy.  Neurological: She is alert and oriented to person, place, and time.  Skin: Skin is warm and dry.  Psychiatric: She has a normal mood and affect. Her behavior is normal.       Assessment:     The patient is 6 years status post right modified radical mastectomy  for inflammatory breast cancer     Plan:     At this point she will continue to do regular self exams. We will plan to see her back in one year.

## 2013-10-02 ENCOUNTER — Ambulatory Visit (HOSPITAL_COMMUNITY)
Admission: RE | Admit: 2013-10-02 | Discharge: 2013-10-02 | Disposition: A | Discharge status: Home / Self Care | Payer: Medicare Other | Source: Ambulatory Visit | Attending: Oncology | Admitting: Oncology

## 2013-10-02 DIAGNOSIS — Z1231 Encounter for screening mammogram for malignant neoplasm of breast: Secondary | ICD-10-CM | POA: Insufficient documentation

## 2013-10-02 DIAGNOSIS — Z139 Encounter for screening, unspecified: Secondary | ICD-10-CM

## 2013-10-03 ENCOUNTER — Other Ambulatory Visit: Payer: Self-pay | Admitting: Cardiology

## 2013-10-03 ENCOUNTER — Other Ambulatory Visit: Payer: Self-pay | Admitting: Adult Health

## 2013-12-02 ENCOUNTER — Other Ambulatory Visit: Payer: Self-pay | Admitting: Cardiology

## 2013-12-14 ENCOUNTER — Ambulatory Visit (HOSPITAL_COMMUNITY): Payer: Medicare Other

## 2013-12-15 NOTE — Progress Notes (Signed)
This encounter was created in error - please disregard.

## 2013-12-16 ENCOUNTER — Encounter (HOSPITAL_COMMUNITY): Payer: Self-pay

## 2014-04-09 ENCOUNTER — Ambulatory Visit (INDEPENDENT_AMBULATORY_CARE_PROVIDER_SITE_OTHER): Payer: Medicare Other | Admitting: General Surgery

## 2014-04-09 ENCOUNTER — Encounter (INDEPENDENT_AMBULATORY_CARE_PROVIDER_SITE_OTHER): Payer: Self-pay | Admitting: General Surgery

## 2014-04-09 VITALS — BP 128/78 | HR 78 | Temp 97.8°F | Resp 14 | Wt 246.0 lb

## 2014-04-09 DIAGNOSIS — C50919 Malignant neoplasm of unspecified site of unspecified female breast: Secondary | ICD-10-CM

## 2014-04-09 NOTE — Patient Instructions (Signed)
Will call with results of duplex. If neg then will prescribe compression sleeve to right arm

## 2014-04-12 ENCOUNTER — Other Ambulatory Visit (INDEPENDENT_AMBULATORY_CARE_PROVIDER_SITE_OTHER): Payer: Self-pay | Admitting: General Surgery

## 2014-04-12 DIAGNOSIS — C50919 Malignant neoplasm of unspecified site of unspecified female breast: Secondary | ICD-10-CM

## 2014-04-13 ENCOUNTER — Ambulatory Visit (HOSPITAL_COMMUNITY)
Admission: RE | Admit: 2014-04-13 | Discharge: 2014-04-13 | Disposition: A | Discharge status: Home / Self Care | Payer: Medicare Other | Source: Ambulatory Visit | Attending: General Surgery | Admitting: General Surgery

## 2014-04-13 DIAGNOSIS — S46819A Strain of other muscles, fascia and tendons at shoulder and upper arm level, unspecified arm, initial encounter: Secondary | ICD-10-CM

## 2014-04-13 DIAGNOSIS — M79609 Pain in unspecified limb: Secondary | ICD-10-CM | POA: Insufficient documentation

## 2014-04-13 DIAGNOSIS — S43499A Other sprain of unspecified shoulder joint, initial encounter: Secondary | ICD-10-CM | POA: Insufficient documentation

## 2014-04-13 DIAGNOSIS — C50919 Malignant neoplasm of unspecified site of unspecified female breast: Secondary | ICD-10-CM

## 2014-04-23 NOTE — Progress Notes (Signed)
Subjective:     Patient ID: [name], female   DOB: July 28, 1933, 80 y.o.   MRN: [id number]  HPI The patient is a 80 year old white female who is about 6-1/2 years status post right modified radical mastectomy for an inflammatory breast cancer. Her health has been stable for the last year. Her main complaint is of some swelling in the right arm  Review of Systems  Constitutional: Negative.   HENT: Negative.   Eyes: Negative.   Respiratory: Negative.   Cardiovascular: Negative.   Gastrointestinal: Negative.   Endocrine: Negative.   Genitourinary: Negative.   Musculoskeletal: Negative.   Skin: Negative.   Allergic/Immunologic: Negative.   Neurological: Negative.   Hematological: Negative.   Psychiatric/Behavioral: Negative.        Objective:   Physical Exam  Constitutional: She is oriented to person, place, and time. She appears well-developed and well-nourished.  HENT:  Head: Normocephalic and atraumatic.  Eyes: Conjunctivae and EOM are normal. Pupils are equal, round, and reactive to light.  Neck: Normal range of motion. Neck supple.  Cardiovascular: Normal rate, regular rhythm and normal heart sounds.   Pulmonary/Chest: Effort normal and breath sounds normal.  There is no palpable mass of the right chest wall. There is no palpable mass of the left breast. There is no palpable axillary, supraclavicular, or cervical lymphadenopathy  Abdominal: Soft. Bowel sounds are normal.  Musculoskeletal: Normal range of motion.  Lymphadenopathy:    She has no cervical adenopathy.  Neurological: She is alert and oriented to person, place, and time.  Skin: Skin is warm and dry.  Psychiatric: She has a normal mood and affect. Her behavior is normal.       Assessment:     The patient is 6-1/2 years status post right modified radical mastectomy for inflammatory breast cancer     Plan:     At this point she will continue to do regular self exams. I will plan to see her back in one  year. She will continue to follow up with medical oncology

## 2014-05-20 ENCOUNTER — Telehealth (INDEPENDENT_AMBULATORY_CARE_PROVIDER_SITE_OTHER): Payer: Self-pay

## 2014-05-20 NOTE — Telephone Encounter (Signed)
Pt called in to get duplex results. Informed pt results came back negative.

## 2014-05-21 ENCOUNTER — Inpatient Hospital Stay (HOSPITAL_COMMUNITY)
Admission: EM | Admit: 2014-05-21 | Discharge: 2014-05-22 | DRG: 872 | Disposition: A | Discharge status: Home / Self Care | Payer: Medicare Other | Attending: Internal Medicine | Admitting: Internal Medicine

## 2014-05-21 ENCOUNTER — Emergency Department (HOSPITAL_COMMUNITY): Payer: Medicare Other

## 2014-05-21 ENCOUNTER — Encounter (HOSPITAL_COMMUNITY): Payer: Self-pay | Admitting: Emergency Medicine

## 2014-05-21 DIAGNOSIS — Z8249 Family history of ischemic heart disease and other diseases of the circulatory system: Secondary | ICD-10-CM

## 2014-05-21 DIAGNOSIS — E11319 Type 2 diabetes mellitus with unspecified diabetic retinopathy without macular edema: Secondary | ICD-10-CM | POA: Diagnosis present

## 2014-05-21 DIAGNOSIS — IMO0001 Reserved for inherently not codable concepts without codable children: Secondary | ICD-10-CM | POA: Diagnosis present

## 2014-05-21 DIAGNOSIS — T4275XA Adverse effect of unspecified antiepileptic and sedative-hypnotic drugs, initial encounter: Secondary | ICD-10-CM | POA: Diagnosis present

## 2014-05-21 DIAGNOSIS — D638 Anemia in other chronic diseases classified elsewhere: Secondary | ICD-10-CM | POA: Diagnosis present

## 2014-05-21 DIAGNOSIS — A419 Sepsis, unspecified organism: Principal | ICD-10-CM | POA: Diagnosis present

## 2014-05-21 DIAGNOSIS — M48061 Spinal stenosis, lumbar region without neurogenic claudication: Secondary | ICD-10-CM

## 2014-05-21 DIAGNOSIS — I129 Hypertensive chronic kidney disease with stage 1 through stage 4 chronic kidney disease, or unspecified chronic kidney disease: Secondary | ICD-10-CM | POA: Diagnosis present

## 2014-05-21 DIAGNOSIS — N183 Chronic kidney disease, stage 3 unspecified: Secondary | ICD-10-CM

## 2014-05-21 DIAGNOSIS — Z820 Family history of epilepsy and other diseases of the nervous system: Secondary | ICD-10-CM

## 2014-05-21 DIAGNOSIS — E119 Type 2 diabetes mellitus without complications: Secondary | ICD-10-CM

## 2014-05-21 DIAGNOSIS — Z9089 Acquired absence of other organs: Secondary | ICD-10-CM

## 2014-05-21 DIAGNOSIS — I4892 Unspecified atrial flutter: Secondary | ICD-10-CM

## 2014-05-21 DIAGNOSIS — F341 Dysthymic disorder: Secondary | ICD-10-CM

## 2014-05-21 DIAGNOSIS — E669 Obesity, unspecified: Secondary | ICD-10-CM

## 2014-05-21 DIAGNOSIS — I4891 Unspecified atrial fibrillation: Secondary | ICD-10-CM | POA: Diagnosis present

## 2014-05-21 DIAGNOSIS — I10 Essential (primary) hypertension: Secondary | ICD-10-CM | POA: Diagnosis present

## 2014-05-21 DIAGNOSIS — E1139 Type 2 diabetes mellitus with other diabetic ophthalmic complication: Secondary | ICD-10-CM | POA: Diagnosis present

## 2014-05-21 DIAGNOSIS — N390 Urinary tract infection, site not specified: Secondary | ICD-10-CM | POA: Diagnosis present

## 2014-05-21 DIAGNOSIS — Z853 Personal history of malignant neoplasm of breast: Secondary | ICD-10-CM

## 2014-05-21 DIAGNOSIS — E785 Hyperlipidemia, unspecified: Secondary | ICD-10-CM

## 2014-05-21 DIAGNOSIS — Z87891 Personal history of nicotine dependence: Secondary | ICD-10-CM

## 2014-05-21 DIAGNOSIS — M797 Fibromyalgia: Secondary | ICD-10-CM

## 2014-05-21 DIAGNOSIS — E1122 Type 2 diabetes mellitus with diabetic chronic kidney disease: Secondary | ICD-10-CM | POA: Diagnosis present

## 2014-05-21 DIAGNOSIS — C50919 Malignant neoplasm of unspecified site of unspecified female breast: Secondary | ICD-10-CM | POA: Diagnosis present

## 2014-05-21 DIAGNOSIS — Z7982 Long term (current) use of aspirin: Secondary | ICD-10-CM

## 2014-05-21 DIAGNOSIS — K589 Irritable bowel syndrome without diarrhea: Secondary | ICD-10-CM

## 2014-05-21 DIAGNOSIS — N179 Acute kidney failure, unspecified: Secondary | ICD-10-CM

## 2014-05-21 DIAGNOSIS — J45909 Unspecified asthma, uncomplicated: Secondary | ICD-10-CM | POA: Diagnosis present

## 2014-05-21 DIAGNOSIS — C50A0 Malignant inflammatory neoplasm of unspecified breast: Secondary | ICD-10-CM | POA: Diagnosis present

## 2014-05-21 DIAGNOSIS — F329 Major depressive disorder, single episode, unspecified: Secondary | ICD-10-CM | POA: Diagnosis present

## 2014-05-21 DIAGNOSIS — R0902 Hypoxemia: Secondary | ICD-10-CM | POA: Diagnosis present

## 2014-05-21 DIAGNOSIS — N10 Acute tubulo-interstitial nephritis: Secondary | ICD-10-CM | POA: Diagnosis present

## 2014-05-21 DIAGNOSIS — F3289 Other specified depressive episodes: Secondary | ICD-10-CM | POA: Diagnosis present

## 2014-05-21 DIAGNOSIS — N189 Chronic kidney disease, unspecified: Secondary | ICD-10-CM

## 2014-05-21 DIAGNOSIS — K219 Gastro-esophageal reflux disease without esophagitis: Secondary | ICD-10-CM | POA: Diagnosis present

## 2014-05-21 DIAGNOSIS — Z794 Long term (current) use of insulin: Secondary | ICD-10-CM

## 2014-05-21 DIAGNOSIS — I679 Cerebrovascular disease, unspecified: Secondary | ICD-10-CM

## 2014-05-21 DIAGNOSIS — D649 Anemia, unspecified: Secondary | ICD-10-CM

## 2014-05-21 DIAGNOSIS — N184 Chronic kidney disease, stage 4 (severe): Secondary | ICD-10-CM | POA: Diagnosis present

## 2014-05-21 DIAGNOSIS — Z9181 History of falling: Secondary | ICD-10-CM

## 2014-05-21 DIAGNOSIS — I951 Orthostatic hypotension: Secondary | ICD-10-CM

## 2014-05-21 DIAGNOSIS — I350 Nonrheumatic aortic (valve) stenosis: Secondary | ICD-10-CM

## 2014-05-21 LAB — URINALYSIS, ROUTINE W REFLEX MICROSCOPIC
Bilirubin Urine: NEGATIVE
Glucose, UA: NEGATIVE mg/dL
KETONES UR: NEGATIVE mg/dL
Nitrite: POSITIVE — AB
Protein, ur: 30 mg/dL — AB
Specific Gravity, Urine: 1.025 (ref 1.005–1.030)
UROBILINOGEN UA: 0.2 mg/dL (ref 0.0–1.0)
pH: 5.5 (ref 5.0–8.0)

## 2014-05-21 LAB — COMPREHENSIVE METABOLIC PANEL
ALT: 11 U/L (ref 0–35)
AST: 13 U/L (ref 0–37)
Albumin: 3.6 g/dL (ref 3.5–5.2)
Alkaline Phosphatase: 35 U/L — ABNORMAL LOW (ref 39–117)
BUN: 34 mg/dL — ABNORMAL HIGH (ref 6–23)
CHLORIDE: 103 meq/L (ref 96–112)
CO2: 22 mEq/L (ref 19–32)
Calcium: 9.1 mg/dL (ref 8.4–10.5)
Creatinine, Ser: 2.21 mg/dL — ABNORMAL HIGH (ref 0.50–1.10)
GFR calc Af Amer: 23 mL/min — ABNORMAL LOW (ref >90)
GFR, EST NON AFRICAN AMERICAN: 20 mL/min — AB (ref >90)
Glucose, Bld: 168 mg/dL — ABNORMAL HIGH (ref 70–99)
Potassium: 5 mEq/L (ref 3.7–5.3)
Sodium: 140 mEq/L (ref 137–147)
Total Bilirubin: 0.4 mg/dL (ref 0.3–1.2)
Total Protein: 6.9 g/dL (ref 6.0–8.3)

## 2014-05-21 LAB — CBC WITH DIFFERENTIAL/PLATELET
BASOS ABS: 0 10*3/uL (ref 0.0–0.1)
Basophils Relative: 1 % (ref 0–1)
Eosinophils Absolute: 0.3 10*3/uL (ref 0.0–0.7)
Eosinophils Relative: 5 % (ref 0–5)
HEMATOCRIT: 36.2 % (ref 36.0–46.0)
Hemoglobin: 11.7 g/dL — ABNORMAL LOW (ref 12.0–15.0)
LYMPHS PCT: 5 % — AB (ref 12–46)
Lymphs Abs: 0.3 10*3/uL — ABNORMAL LOW (ref 0.7–4.0)
MCH: 28.8 pg (ref 26.0–34.0)
MCHC: 32.3 g/dL (ref 30.0–36.0)
MCV: 89.2 fL (ref 78.0–100.0)
MONO ABS: 0 10*3/uL — AB (ref 0.1–1.0)
Monocytes Relative: 0 % — ABNORMAL LOW (ref 3–12)
NEUTROS ABS: 5.6 10*3/uL (ref 1.7–7.7)
Neutrophils Relative %: 89 % — ABNORMAL HIGH (ref 43–77)
Platelets: 165 10*3/uL (ref 150–400)
RBC: 4.06 MIL/uL (ref 3.87–5.11)
RDW: 13.2 % (ref 11.5–15.5)
WBC: 6.3 10*3/uL (ref 4.0–10.5)

## 2014-05-21 LAB — URINE MICROSCOPIC-ADD ON

## 2014-05-21 LAB — GLUCOSE, CAPILLARY: GLUCOSE-CAPILLARY: 158 mg/dL — AB (ref 70–99)

## 2014-05-21 MED ORDER — DEXTROSE 5 % IV SOLN
1.0000 g | Freq: Once | INTRAVENOUS | Status: AC
  Administered 2014-05-21: 1 g via INTRAVENOUS
  Filled 2014-05-21: qty 10

## 2014-05-21 MED ORDER — DEXTROSE 5 % IV SOLN
1.0000 g | INTRAVENOUS | Status: DC
  Filled 2014-05-21 (×2): qty 10

## 2014-05-21 MED ORDER — ATORVASTATIN CALCIUM 40 MG PO TABS: 80.0000 mg | ORAL_TABLET | Freq: Every day | ORAL | Status: DC

## 2014-05-21 MED ORDER — ONDANSETRON HCL 4 MG PO TABS: 4.0000 mg | ORAL_TABLET | Freq: Four times a day (QID) | ORAL | Status: DC | PRN

## 2014-05-21 MED ORDER — HYDROMORPHONE HCL PF 1 MG/ML IJ SOLN
1.0000 mg | Freq: Once | INTRAMUSCULAR | Status: AC
  Administered 2014-05-21: 1 mg via INTRAVENOUS
  Filled 2014-05-21: qty 1

## 2014-05-21 MED ORDER — ALBUTEROL SULFATE (2.5 MG/3ML) 0.083% IN NEBU: 2.5000 mg | INHALATION_SOLUTION | RESPIRATORY_TRACT | Status: DC | PRN

## 2014-05-21 MED ORDER — SODIUM CHLORIDE 0.9 % IV SOLN
Freq: Once | INTRAVENOUS | Status: AC
  Administered 2014-05-21: 1000 mL via INTRAVENOUS

## 2014-05-21 MED ORDER — RALOXIFENE HCL 60 MG PO TABS
60.0000 mg | ORAL_TABLET | Freq: Every day | ORAL | Status: DC
  Administered 2014-05-22: 60 mg via ORAL
  Filled 2014-05-21: qty 1

## 2014-05-21 MED ORDER — INSULIN ASPART 100 UNIT/ML ~~LOC~~ SOLN
0.0000 [IU] | Freq: Three times a day (TID) | SUBCUTANEOUS | Status: DC
  Administered 2014-05-22: 7 [IU] via SUBCUTANEOUS
  Administered 2014-05-22: 3 [IU] via SUBCUTANEOUS

## 2014-05-21 MED ORDER — ACETAMINOPHEN 500 MG PO TABS
1000.0000 mg | ORAL_TABLET | Freq: Once | ORAL | Status: AC
  Administered 2014-05-21: 1000 mg via ORAL
  Filled 2014-05-21: qty 2

## 2014-05-21 MED ORDER — PAROXETINE HCL 20 MG PO TABS
40.0000 mg | ORAL_TABLET | Freq: Every day | ORAL | Status: DC
  Administered 2014-05-22: 40 mg via ORAL
  Filled 2014-05-21: qty 2

## 2014-05-21 MED ORDER — HEPARIN SODIUM (PORCINE) 5000 UNIT/ML IJ SOLN
5000.0000 [IU] | Freq: Three times a day (TID) | INTRAMUSCULAR | Status: DC
  Administered 2014-05-22: 5000 [IU] via SUBCUTANEOUS
  Filled 2014-05-21: qty 1

## 2014-05-21 MED ORDER — ASPIRIN EC 81 MG PO TBEC
81.0000 mg | DELAYED_RELEASE_TABLET | Freq: Every day | ORAL | Status: DC
  Administered 2014-05-22: 81 mg via ORAL
  Filled 2014-05-21: qty 1

## 2014-05-21 MED ORDER — DOCUSATE SODIUM 100 MG PO CAPS
100.0000 mg | ORAL_CAPSULE | Freq: Every day | ORAL | Status: DC
  Administered 2014-05-22: 100 mg via ORAL
  Filled 2014-05-21: qty 1

## 2014-05-21 MED ORDER — ACETAMINOPHEN 650 MG RE SUPP: 650.0000 mg | Freq: Four times a day (QID) | RECTAL | Status: DC | PRN

## 2014-05-21 MED ORDER — ACETAMINOPHEN 325 MG PO TABS
650.0000 mg | ORAL_TABLET | Freq: Four times a day (QID) | ORAL | Status: DC | PRN
  Administered 2014-05-22: 650 mg via ORAL
  Filled 2014-05-21: qty 2

## 2014-05-21 MED ORDER — PANTOPRAZOLE SODIUM 40 MG PO TBEC
40.0000 mg | DELAYED_RELEASE_TABLET | Freq: Every day | ORAL | Status: DC
  Administered 2014-05-22: 40 mg via ORAL
  Filled 2014-05-21: qty 1

## 2014-05-21 MED ORDER — METOPROLOL TARTRATE 50 MG PO TABS
50.0000 mg | ORAL_TABLET | Freq: Two times a day (BID) | ORAL | Status: DC
  Administered 2014-05-22 (×2): 50 mg via ORAL
  Filled 2014-05-21 (×2): qty 1

## 2014-05-21 MED ORDER — INSULIN GLARGINE 100 UNIT/ML ~~LOC~~ SOLN
40.0000 [IU] | Freq: Every day | SUBCUTANEOUS | Status: DC
  Administered 2014-05-22: 40 [IU] via SUBCUTANEOUS
  Filled 2014-05-21 (×3): qty 0.4

## 2014-05-21 MED ORDER — SODIUM CHLORIDE 0.45 % IV SOLN
INTRAVENOUS | Status: AC
  Administered 2014-05-21: 1000 mL via INTRAVENOUS

## 2014-05-21 MED ORDER — PREGABALIN 50 MG PO CAPS
50.0000 mg | ORAL_CAPSULE | Freq: Three times a day (TID) | ORAL | Status: DC
  Administered 2014-05-22: 50 mg via ORAL
  Filled 2014-05-21: qty 1

## 2014-05-21 MED ORDER — INSULIN ASPART 100 UNIT/ML ~~LOC~~ SOLN: 0.0000 [IU] | Freq: Every day | SUBCUTANEOUS | Status: DC

## 2014-05-21 MED ORDER — ONDANSETRON HCL 4 MG/2ML IJ SOLN
4.0000 mg | Freq: Once | INTRAMUSCULAR | Status: AC
  Administered 2014-05-21: 4 mg via INTRAVENOUS
  Filled 2014-05-21: qty 2

## 2014-05-21 MED ORDER — CYCLOBENZAPRINE HCL 10 MG PO TABS: 10.0000 mg | ORAL_TABLET | Freq: Three times a day (TID) | ORAL | Status: DC | PRN

## 2014-05-21 MED ORDER — ONDANSETRON HCL 4 MG/2ML IJ SOLN: 4.0000 mg | Freq: Four times a day (QID) | INTRAMUSCULAR | Status: DC | PRN

## 2014-05-21 NOTE — H&P (Addendum)
[facility] History and Physical  [name] NWG:[id number] DOB: 04-30-1933 DOA: 05/21/2014   PCP: Kirk Ruths, MD  Specialists: She is followed by nephrologist, Dr. Hyman Hopes and by general surgery and oncology for her history of breast cancer. Followed by cardiology as well.  Chief Complaint: Pain in the left flank with nausea and vomiting  HPI: [name] is a 80 y.o. female with a past medical history of diabetes, hypertension, aortic stenosis, chronic kidney disease, breast cancer who was in her usual state of health earlier today when she started noticing pain in her left flank area. She has had few episodes of nausea and vomiting as well. She has experienced burning sensation with urination. Patient was given narcotics for her pain and so, she is somewhat confused at this time. So history is limited. She denies any pain currently. Hasn't had any nausea, vomiting in the emergency department. She denies any fever or chills at home, but was found to have a temperature of 103F in the ED. Denies any syncopal episodes. No dizziness or lightheadedness. She reports a history of falls at home though none recently. She uses a wheelchair in the house. Denies any headaches.  Home Medications: Prior to Admission medications   Medication Sig Start Date End Date Taking? Authorizing Latha Staunton  albuterol (PROAIR HFA) 108 (90 BASE) MCG/ACT inhaler Inhale 2 puffs into the lungs every 6 (six) hours as needed for wheezing or shortness of breath.    Yes Historical Hendrixx Severin, MD  aspirin EC 81 MG tablet Take 81 mg by mouth daily.   Yes Historical Emery Dupuy, MD  cyclobenzaprine (FLEXERIL) 10 MG tablet Take 10 mg by mouth 3 (three) times daily as needed.  08/29/11  Yes Historical Jcion Buddenhagen, MD  esomeprazole (NEXIUM) 40 MG capsule Take 40 mg by mouth every morning.    Yes Historical Shaundrea Carrigg, MD  fenofibrate (TRICOR) 145 MG tablet Take 145 mg by mouth daily.   Yes Historical Remigio Mcmillon, MD  GARCINIA  CAMBOGIA-CHROMIUM PO Take 2 capsules by mouth daily.   Yes Historical Florida Nolton, MD  glipiZIDE (GLUCOTROL) 10 MG tablet Take 10 mg by mouth 2 (two) times daily.    Yes Historical Romin Divita, MD  lisinopril-hydrochlorothiazide (PRINZIDE,ZESTORETIC) 20-25 MG per tablet Take 1 tablet by mouth daily.   Yes Historical Rendell Thivierge, MD  loperamide (IMODIUM) 2 MG capsule Take 2 mg by mouth every 6 (six) hours as needed for diarrhea or loose stools (*Not to exceed 4 capsules per day*).    Yes Historical Jackalyn Haith, MD  metoprolol (LOPRESSOR) 50 MG tablet Take 50 mg by mouth 2 (two) times daily.   Yes Historical Johnelle Tafolla, MD  metoprolol (LOPRESSOR) 50 MG tablet Take 50 mg by mouth 2 (two) times daily.   Yes Historical Sherif Millspaugh, MD  PARoxetine (PAXIL) 40 MG tablet Take 40 mg by mouth every morning.     Yes Historical Shavon Zenz, MD  potassium chloride SA (K-DUR,KLOR-CON) 20 MEQ tablet Take 20-40 mEq by mouth daily as needed.    Yes Historical Alexismarie Flaim, MD  pregabalin (LYRICA) 50 MG capsule Take 50 mg by mouth 3 (three) times daily.     Yes Historical Fayth Trefry, MD  raloxifene (EVISTA) 60 MG tablet Take 60 mg by mouth daily.   Yes Historical Matheson Vandehei, MD  rosuvastatin (CRESTOR) 40 MG tablet Take 40 mg by mouth at bedtime.   Yes Historical Azra Abrell, MD  traMADol (ULTRAM) 50 MG tablet Take 50-100 mg by mouth every 4 (four) hours as needed for moderate pain or severe pain.  Yes Historical Nazaire Cordial, MD  Calcium Carb-Cholecalciferol (CALCIUM 1000 + D PO) Take 1 tablet by mouth daily.      Historical Khadijah Mastrianni, MD  diazepam (VALIUM) 10 MG tablet Take 10 mg by mouth every 6 (six) hours as needed.      Historical Keenan Trefry, MD  docusate sodium (COLACE) 100 MG capsule Take 100 mg by mouth daily.      Historical Tahjanae Blankenburg, MD  Insulin Glargine (LANTUS [place]) Inject 60 Units into the skin every morning.     Historical Demetrice Amstutz, MD  Multiple Vitamin (DAILY VITES PO) Take 1 tablet by mouth daily.      Historical Tamina Cyphers, MD  NOVOLOG  FLEXPEN 100 UNIT/ML injection On sliding scale 4 times daily 08/14/11   Historical Ossie Yebra, MD    Allergies:  Allergies  Allergen Reactions  . Imodium [Loperamide Hcl] Other (See Comments)    Had a reaction (unknown) but has never taken it again   . Mold Extract [Trichophyton Mentagrophyte]     Mildew, dust  . Niaspan [Niacin] Other (See Comments)    flushing    Past Medical History: Past Medical History  Diagnosis Date  . Breast carcinoma   . Anemia   . IBS (irritable bowel syndrome)     diverticulosis, gastroesophageal reflux disease  . Hypertension   . Asthma   . Diabetes mellitus   . Hyperlipidemia   . Arthritis   . Headaches, cluster   . Lower back pain   . Aortic stenosis     Mild  . Chronic kidney disease     Creatinine-1.5 in 2010 and 2.5-3 in 2011; 1.5-10/2010;  Klebsiella UTI-10/2010. urine protein 36 mg/dl, mildly elevated  . Depression   . Fibromyalgia   . Cerebrovascular disease     with a 60% LICA; repeat study in 10/2009-no obstructive disease; modest ASVD  . Falls infrequently 01/2010    fracture of pelvis and left humerus  . Atrial flutter 2002  . DJD (degenerative joint disease) 11/14/2011  . Obesity 11/14/2011  . Diabetic retinopathy   . Decreased bone density     Past Surgical History  Procedure Laterality Date  . Mastectomy      Right breast for carcinoma  . Lumbar disc surgery      lumbosacral spine procedure x 2  . Appendectomy    . Cholecystectomy    . Cataract extraction, bilateral      Lens implants  . Central venous catheter tunneled insertion single lumen    . Colonoscopy  2012  . Eye surgery  nov 2012    for diabetic retinopathy    Social History: She lives by herself. No history of smoking, alcohol use or illicit drug use. Uses a wheelchair and walker.  Family History:  Family History  Problem Relation Age of Onset  . Alzheimer's disease Mother   . Heart attack Father   . Aneurysm Sister   . Coronary artery disease  Brother      Review of Systems - unable to obtain due to the narcotic-induced confusion  Physical Examination  Filed Vitals:   05/21/14 2100 05/21/14 2110 05/21/14 2130 05/21/14 2200  BP: 131/56  126/57 115/94  Pulse: 92  91 89  Temp: 101.1 F (38.4 C)     TempSrc:      Resp: 16     Height:      Weight:      SpO2: 82% 98% 90% 92%    BP 115/94  Pulse 89  Temp(Src) 101.1 F (  38.4 C) (Oral)  Resp 16  Ht 5\' 9"  (1.753 m)  Wt 105.235 kg (232 lb)  BMI 34.24 kg/m2  SpO2 92%  General appearance: alert, distracted, no distress and moderately obese Head: Normocephalic, without obvious abnormality, atraumatic Eyes: conjunctivae/corneas clear. PERRL, EOM's intact. Throat: lips, mucosa, and tongue normal; teeth and gums normal Neck: no adenopathy, no carotid bruit, no JVD, supple, symmetrical, trachea midline and thyroid not enlarged, symmetric, no tenderness/mass/nodules Back: symmetric, no curvature. ROM normal. No CVA tenderness. Resp: clear to auscultation bilaterally Cardio: regular rate and rhythm, S1, S2 normal, no murmur, click, rub or gallop GI: soft, non-tender; bowel sounds normal; no masses,  no organomegaly Extremities: extremities normal, atraumatic, no cyanosis or edema Pulses: 2+ and symmetric Skin: Skin color, texture, turgor normal. No rashes or lesions Lymph nodes: Cervical, supraclavicular, and axillary nodes normal. Neurologic: She is alert. She knew where she was. Somewhat slow to respond at times. No facial droop. Tongue is midline. Motor strength is equal in bilateral upper extremities. Gait was not assessed.  Laboratory Data: Results for orders placed during the hospital encounter of 05/21/14 (from the past 48 hour(s))  URINALYSIS, ROUTINE W REFLEX MICROSCOPIC     Status: Abnormal   Collection Time    05/21/14  7:45 PM      Result Value Ref Range   Color, Urine YELLOW  YELLOW   APPearance CLEAR  CLEAR   Specific Gravity, Urine 1.025  1.005 - 1.030   pH  5.5  5.0 - 8.0   Glucose, UA NEGATIVE  NEGATIVE mg/dL   Hgb urine dipstick LARGE (*) NEGATIVE   Bilirubin Urine NEGATIVE  NEGATIVE   Ketones, ur NEGATIVE  NEGATIVE mg/dL   Protein, ur 30 (*) NEGATIVE mg/dL   Urobilinogen, UA 0.2  0.0 - 1.0 mg/dL   Nitrite POSITIVE (*) NEGATIVE   Leukocytes, UA SMALL (*) NEGATIVE  URINE MICROSCOPIC-ADD ON     Status: Abnormal   Collection Time    05/21/14  7:45 PM      Result Value Ref Range   WBC, UA 21-50  <3 WBC/hpf   RBC / HPF 21-50  <3 RBC/hpf   Bacteria, UA MANY (*) RARE  CBC WITH DIFFERENTIAL     Status: Abnormal   Collection Time    05/21/14  7:51 PM      Result Value Ref Range   WBC 6.3  4.0 - 10.5 K/uL   RBC 4.06  3.87 - 5.11 MIL/uL   Hemoglobin 11.7 (*) 12.0 - 15.0 g/dL   HCT 78.2  95.6 - 21.3 %   MCV 89.2  78.0 - 100.0 fL   MCH 28.8  26.0 - 34.0 pg   MCHC 32.3  30.0 - 36.0 g/dL   RDW 08.6  57.8 - 46.9 %   Platelets 165  150 - 400 K/uL   Neutrophils Relative % 89 (*) 43 - 77 %   Neutro Abs 5.6  1.7 - 7.7 K/uL   Lymphocytes Relative 5 (*) 12 - 46 %   Lymphs Abs 0.3 (*) 0.7 - 4.0 K/uL   Monocytes Relative 0 (*) 3 - 12 %   Monocytes Absolute 0.0 (*) 0.1 - 1.0 K/uL   Eosinophils Relative 5  0 - 5 %   Eosinophils Absolute 0.3  0.0 - 0.7 K/uL   Basophils Relative 1  0 - 1 %   Basophils Absolute 0.0  0.0 - 0.1 K/uL  COMPREHENSIVE METABOLIC PANEL     Status: Abnormal  Collection Time    05/21/14  7:51 PM      Result Value Ref Range   Sodium 140  137 - 147 mEq/L   Potassium 5.0  3.7 - 5.3 mEq/L   Chloride 103  96 - 112 mEq/L   CO2 22  19 - 32 mEq/L   Glucose, Bld 168 (*) 70 - 99 mg/dL   BUN 34 (*) 6 - 23 mg/dL   Creatinine, Ser 1.91 (*) 0.50 - 1.10 mg/dL   Calcium 9.1  8.4 - 47.8 mg/dL   Total Protein 6.9  6.0 - 8.3 g/dL   Albumin 3.6  3.5 - 5.2 g/dL   AST 13  0 - 37 U/L   ALT 11  0 - 35 U/L   Alkaline Phosphatase 35 (*) 39 - 117 U/L   Total Bilirubin 0.4  0.3 - 1.2 mg/dL   GFR calc non Af Amer 20 (*) >90 mL/min   GFR calc  Af Amer 23 (*) >90 mL/min   Comment: (NOTE)     The eGFR has been calculated using the CKD EPI equation.     This calculation has not been validated in all clinical situations.     eGFR's persistently <90 mL/min signify possible Chronic Kidney     Disease.    Radiology Reports: Ct Abdomen Pelvis Wo Contrast  05/21/2014   CLINICAL DATA:  Left flank pain  EXAM: CT ABDOMEN AND PELVIS WITHOUT CONTRAST  TECHNIQUE: Multidetector CT imaging of the abdomen and pelvis was performed following the standard protocol without IV contrast.  COMPARISON:  Renal ultrasound 02/06/2010, lumbar spine CT 02/04/2010  FINDINGS: Scarring versus atelectasis within the lung bases. Heart is enlarged. Atherosclerotic calcifications within the coronary vessels.  Noncontrast evaluation of the liver, spleen, adrenals, pancreas is unremarkable. Patient status post cholecystectomy.  Stable left renal cyst. Kidneys otherwise unremarkable. Specifically no evidence of obstructive uropathy nor calculus disease.  Atherosclerotic calcifications of the abdominal aorta, mesenteric and iliac vessels. No evidence of abdominal aortic aneurysm.  Postsurgical and chronic changes within the lumbar spine. Spinal hardware appreciated. Chronic superior endplate depression of L1. Multilevel spondylosis within the thoracic spine. No aggressive appearing osseous lesions.  Small fat containing umbilical hernia.  No inguinal hernia.  IMPRESSION: Scarring versus atelectasis within the lung bases  Atherosclerotic disease within the coronary vessels, aorta, and mesenteric within the  Within the limitations of a noncontrast CT otherwise no evidence of abdominal or pelvic pathology. Specifically no evidence of renal calculus disease nor obstructive uropathy.   Electronically Signed   By: Salome Holmes M.D.   On: 05/21/2014 20:25   Dg Chest 2 View  05/21/2014   CLINICAL DATA:  FLANK PAIN  EXAM: CHEST  2 VIEW  COMPARISON:  Two-view chest 05/19/2012  FINDINGS: Low  lung volumes. Cardiac silhouette is enlarged. Stable left central venous catheter. Minimal areas of linear density within the lung bases. No focal regions of consolidation or focal infiltrates. No acute osseus abnormalities.  IMPRESSION: Scarring versus atelectasis within the lung bases. No acute cardiopulmonary disease.   Electronically Signed   By: Salome Holmes M.D.   On: 05/21/2014 21:49     Problem List  Principal Problem:   UTI (lower urinary tract infection) Active Problems:   DIABETES MELLITUS, TYPE II   Atrial flutter   Inflammatory carcinoma of right breast   Hypertension   Chronic kidney disease   Acute on chronic renal failure   Assessment: This is a 80 year old, Caucasian female, who presents with  left flank pain, and is found to have a fever with a temperature of 103F. UA is remarkably abnormal. She has a UTI, possibly even acute pyelonephritis. This will explain her symptomatology and fever. She also has mild acute on chronic renal failure. Nausea, vomiting is most likely due to her infection.  Plan: #1 acute pyelonephritis/UTI: Urine cultures will be sent. She has been given ceftriaxone, which will be continued. Antipyretics, and pain medications as needed.  #2 mild acute on chronic renal failure: She'll be given gentle IV hydration. Monitor urine output. Foley catheter had to be placed because the patient was unable to urinate. This will need to be removed as soon as possible.  #3 nausea and vomiting: Most likely due to acute illness. Abdomen is benign. CT scan was unremarkable for acute process. Treat symptomatically for now.  #4 history of diabetes mellitus, type II: Continue with her Lantus and sliding scale coverage. Check HbA1c.  #5 history of breast cancer: Currently, in remission.  #6 remote history of atrial flutter, mild aortic stenosis, hypertension, hyperlipidemia: She is followed by a cardiologist. Continue with her home medications. These issues appear to  be stable and at baseline.  #7 hypoxia: Secondary to narcotics. Oxygenation has improved with Oxygen by nasal cannula. Chest x-ray does not show any obvious abnormalities. Hypoxia is most likely due to medications. Monitor for now  Since she reported falls at home we will get PT and OT to see her as well.  DVT Prophylaxis: Heparin Code Status: Full code Family Communication: No family at bedside.  Disposition Plan: Admit to MedSurg.   Further management decisions will depend on results of further testing and patient's response to treatment.   [facility]  [facility] Pager [phone]  If 7PM-7AM, please contact night-coverage [email or web address] Password [facility]  05/21/2014, 10:47 PM  Disclaimer: This note was dictated with voice recognition software. Similar sounding words can inadvertently be transcribed and may not be corrected upon review.

## 2014-05-21 NOTE — ED Notes (Signed)
Patient complaining of left flank pain and vomiting since 1600 today. Also complaining of dysuria.

## 2014-05-21 NOTE — ED Provider Notes (Signed)
CSN: [id number]     Arrival date & time 05/21/14  1814 History   First MD Initiated Contact with Patient 05/21/14 1906    This chart was scribed for Maudry Diego, MD by Terressa Koyanagi, ED Scribe. This patient was seen in room APA18/APA18 and the patient's care was started at 7:09 PM.  Chief Complaint  Patient presents with  . Flank Pain   Patient is a 80 y.o. female presenting with flank pain. The history is provided by the patient. No language interpreter was used.  Flank Pain This is a new problem. The current episode started 3 to 5 hours ago. The problem has been gradually worsening. Associated symptoms include abdominal pain ( LLQ pain). Pertinent negatives include no chest pain and no headaches. She has tried nothing for the symptoms.   HPI Comments: [name] is a 80 y.o. female, with an extensive medical Hx, including HTN, breast carcinoma, HLD, chronic kidney disease (pt denies being on dialysis), arthritis, and diabetic retinopathy, who presents to the Emergency Department complaining of left flank pain radiating to the left abd and vomiting since 4:00PM today. Pt describes her flank pain as an aching pain and rates it a 6 out of 10. Pt also complains of dysuria. Pt reports the pain comes and goes. Pt denies any Hx of kidney stones.    Past Medical History  Diagnosis Date  . Breast carcinoma   . Anemia   . IBS (irritable bowel syndrome)     diverticulosis, gastroesophageal reflux disease  . Hypertension   . Asthma   . Diabetes mellitus   . Hyperlipidemia   . Arthritis   . Headaches, cluster   . Lower back pain   . Aortic stenosis     Mild  . Chronic kidney disease     Creatinine-1.5 in 2010 and 2.5-3 in 2011; 1.5-10/2010;  Klebsiella UTI-10/2010. urine protein 36 mg/dl, mildly elevated  . Depression   . Fibromyalgia   . Cerebrovascular disease     with a 56% LICA; repeat study in 10/2009-no obstructive disease; modest ASVD  . Falls infrequently 01/2010    fracture of  pelvis and left humerus  . Atrial flutter 2002  . DJD (degenerative joint disease) 11/14/2011  . Obesity 11/14/2011  . Diabetic retinopathy   . Decreased bone density    Past Surgical History  Procedure Laterality Date  . Mastectomy      Right breast for carcinoma  . Lumbar disc surgery      lumbosacral spine procedure x 2  . Appendectomy    . Cholecystectomy    . Cataract extraction, bilateral      Lens implants  . Central venous catheter tunneled insertion single lumen    . Colonoscopy  2012  . Eye surgery  nov 2012    for diabetic retinopathy   Family History  Problem Relation Age of Onset  . Alzheimer's disease Mother   . Heart attack Father   . Aneurysm Sister   . Coronary artery disease Brother    History  Substance Use Topics  . Smoking status: Former Research scientist (life sciences)  . Smokeless tobacco: Never Used  . Alcohol Use: No   OB History   Grav Para Term Preterm Abortions TAB SAB Ect Mult Living                 Review of Systems  Constitutional: Negative for appetite change and fatigue.  HENT: Negative for congestion, ear discharge and sinus pressure.   Eyes:  Negative for discharge.  Respiratory: Negative for cough.   Cardiovascular: Negative for chest pain.  Gastrointestinal: Positive for vomiting and abdominal pain ( LLQ pain). Negative for diarrhea.  Genitourinary: Positive for flank pain ( left flank pain ). Negative for frequency and hematuria.  Musculoskeletal: Negative for back pain.  Skin: Negative for rash.  Neurological: Negative for seizures and headaches.  Psychiatric/Behavioral: Negative for hallucinations.      Allergies  Imodium; Mold extract; and Niaspan  Home Medications   Prior to Admission medications   Medication Sig Start Date End Date Taking? Authorizing Provider  ACCU-CHEK AVIVA PLUS test strip  06/02/13   Historical Provider, MD  albuterol (PROAIR HFA) 108 (90 BASE) MCG/ACT inhaler Inhale 2 puffs into the lungs every 6 (six) hours as  needed.     Historical Provider, MD  Artificial Tear Solution (TEARS NATURALE II) SOLN Apply to eye as needed.      Historical Provider, MD  aspirin (ASPIR-LOW) 81 MG EC tablet Take 81 mg by mouth daily.      Historical Provider, MD  Calcium Carb-Cholecalciferol (CALCIUM 1000 + D PO) Take 1 tablet by mouth daily.      Historical Provider, MD  ciprofloxacin (CIPRO) 500 MG tablet  04/01/14   Historical Provider, MD  CRESTOR 40 MG tablet TAKE (1) TABLET BY MOUTH AT BEDTIME. 12/02/13   Herminio Commons, MD  cyclobenzaprine (FLEXERIL) 10 MG tablet Take 10 mg by mouth 3 (three) times daily as needed.  08/29/11   Historical Provider, MD  diazepam (VALIUM) 10 MG tablet Take 10 mg by mouth every 6 (six) hours as needed.      Historical Provider, MD  docusate sodium (COLACE) 100 MG capsule Take 100 mg by mouth daily.      Historical Provider, MD  esomeprazole (NEXIUM) 40 MG capsule Take 40 mg by mouth daily before breakfast.      Historical Provider, MD  fenofibrate 160 MG tablet TAKE 1 TABLET BY MOUTH ONCE A DAY WITH MEAL. 11/08/11   Yehuda Savannah, MD  glipiZIDE (GLUCOTROL) 10 MG tablet Take 10 mg by mouth once.    Historical Provider, MD  hydrochlorothiazide (MICROZIDE) 12.5 MG capsule 12.5 mg daily.  05/04/13   Historical Provider, MD  HYDROcodone-acetaminophen (LORCET) 10-650 MG per tablet Take 1 tablet by mouth every 6 (six) hours as needed. Takes 2 every morning    Historical Provider, MD  hyoscyamine (LEVSIN) 0.125 MG/5ML ELIX Take 0.125 mg by mouth every 6 (six) hours as needed.    Historical Provider, MD  Insulin Glargine (LANTUS [place]) Inject 60 Units into the skin every morning.     Historical Provider, MD  lisinopril (PRINIVIL,ZESTRIL) 20 MG tablet Take 1 tablet (20 mg total) by mouth every evening. 09/24/13   Lendon Colonel, NP  lisinopril-hydrochlorothiazide (PRINZIDE,ZESTORETIC) 20-25 MG per tablet TAKE 1 TABLET BY MOUTH ONCE A DAY. 10/03/13   Herminio Commons, MD  loperamide (IMODIUM) 2  MG capsule Take 2 mg by mouth 4 (four) times daily as needed.    Historical Provider, MD  metoprolol (LOPRESSOR) 50 MG tablet TAKE 1 TABLET BY MOUTH TWICE DAILY. 10/03/13   Herminio Commons, MD  Multiple Vitamin (DAILY VITES PO) Take 1 tablet by mouth daily.      Historical Provider, MD  NOVOLOG FLEXPEN 100 UNIT/ML injection On sliding scale 4 times daily 08/14/11   Historical Provider, MD  PARoxetine (PAXIL) 40 MG tablet Take 40 mg by mouth every morning.  Historical Provider, MD  potassium chloride SA (K-DUR,KLOR-CON) 20 MEQ tablet Take 20 mEq by mouth 2 (two) times daily.    Historical Provider, MD  pregabalin (LYRICA) 50 MG capsule Take 50 mg by mouth 3 (three) times daily.      Historical Provider, MD  raloxifene (EVISTA) 60 MG tablet Take 60 mg by mouth daily.    Historical Provider, MD  traMADol (ULTRAM) 50 MG tablet Take 50 mg by mouth every 6 (six) hours as needed.      Historical Provider, MD   Triage Vitals: BP 178/70  Pulse 90  Temp(Src) 97.8 F (36.6 C) (Oral)  Ht $R'5\' 9"'Dz$  (1.753 m)  Wt 232 lb (105.235 kg)  BMI 34.24 kg/m2 Physical Exam  Constitutional: She is oriented to person, place, and time. She appears well-developed.  HENT:  Head: Normocephalic.  Eyes: Conjunctivae and EOM are normal. No scleral icterus.  Neck: Neck supple. No thyromegaly present.  Cardiovascular: Normal rate and regular rhythm.  Exam reveals no gallop and no friction rub.   No murmur heard. Pulmonary/Chest: No stridor. She has no wheezes. She has no rales. She exhibits no tenderness.  Abdominal: She exhibits no distension. There is tenderness (moderate LLQ tenderness). There is no rebound.  Genitourinary:  Moderate left flank tenderness and   Musculoskeletal: Normal range of motion. She exhibits no edema.  Lymphadenopathy:    She has no cervical adenopathy.  Neurological: She is oriented to person, place, and time. She exhibits normal muscle tone. Coordination normal.  Skin: No rash noted. No  erythema.  Psychiatric: She has a normal mood and affect. Her behavior is normal.    ED Course  Procedures (including critical care time)  COORDINATION OF CARE:  7:11 PM-Discussed treatment plan which includes meds, imaging and labs with pt at bedside and pt agreed to plan.   Labs Review Labs Reviewed - No data to display  Imaging Review No results found.   EKG Interpretation None      MDM   Final diagnoses:  None    The chart was scribed for me under my direct supervision.  I personally performed the history, physical, and medical decision making and all procedures in the evaluation of this patient.Maudry Diego, MD 05/21/14 2222

## 2014-05-22 DIAGNOSIS — E785 Hyperlipidemia, unspecified: Secondary | ICD-10-CM

## 2014-05-22 DIAGNOSIS — N183 Chronic kidney disease, stage 3 unspecified: Secondary | ICD-10-CM

## 2014-05-22 DIAGNOSIS — I359 Nonrheumatic aortic valve disorder, unspecified: Secondary | ICD-10-CM

## 2014-05-22 DIAGNOSIS — D649 Anemia, unspecified: Secondary | ICD-10-CM

## 2014-05-22 DIAGNOSIS — I4892 Unspecified atrial flutter: Secondary | ICD-10-CM

## 2014-05-22 DIAGNOSIS — E669 Obesity, unspecified: Secondary | ICD-10-CM

## 2014-05-22 DIAGNOSIS — I10 Essential (primary) hypertension: Secondary | ICD-10-CM

## 2014-05-22 LAB — BASIC METABOLIC PANEL
BUN: 39 mg/dL — AB (ref 6–23)
CHLORIDE: 102 meq/L (ref 96–112)
CO2: 24 meq/L (ref 19–32)
CREATININE: 2.53 mg/dL — AB (ref 0.50–1.10)
Calcium: 8.2 mg/dL — ABNORMAL LOW (ref 8.4–10.5)
GFR calc Af Amer: 20 mL/min — ABNORMAL LOW (ref >90)
GFR calc non Af Amer: 17 mL/min — ABNORMAL LOW (ref >90)
GLUCOSE: 326 mg/dL — AB (ref 70–99)
Potassium: 4.6 mEq/L (ref 3.7–5.3)
Sodium: 138 mEq/L (ref 137–147)

## 2014-05-22 LAB — CBC
HCT: 32.6 % — ABNORMAL LOW (ref 36.0–46.0)
HEMATOCRIT: 31.2 % — AB (ref 36.0–46.0)
Hemoglobin: 10.4 g/dL — ABNORMAL LOW (ref 12.0–15.0)
Hemoglobin: 10 g/dL — ABNORMAL LOW (ref 12.0–15.0)
MCH: 28.5 pg (ref 26.0–34.0)
MCH: 28.6 pg (ref 26.0–34.0)
MCHC: 31.9 g/dL (ref 30.0–36.0)
MCHC: 32.1 g/dL (ref 30.0–36.0)
MCV: 89.3 fL (ref 78.0–100.0)
MCV: 89.1 fL (ref 78.0–100.0)
PLATELETS: 175 10*3/uL (ref 150–400)
Platelets: 191 10*3/uL (ref 150–400)
RBC: 3.65 MIL/uL — ABNORMAL LOW (ref 3.87–5.11)
RBC: 3.5 MIL/uL — ABNORMAL LOW (ref 3.87–5.11)
RDW: 13.4 % (ref 11.5–15.5)
RDW: 13.5 % (ref 11.5–15.5)
WBC: 18.1 10*3/uL — AB (ref 4.0–10.5)
WBC: 16.9 10*3/uL — AB (ref 4.0–10.5)

## 2014-05-22 LAB — GLUCOSE, CAPILLARY
Glucose-Capillary: 227 mg/dL — ABNORMAL HIGH (ref 70–99)
Glucose-Capillary: 307 mg/dL — ABNORMAL HIGH (ref 70–99)

## 2014-05-22 LAB — HEMOGLOBIN A1C
HEMOGLOBIN A1C: 8.6 % — AB (ref <5.7)
MEAN PLASMA GLUCOSE: 200 mg/dL — AB (ref <117)

## 2014-05-22 LAB — COMPREHENSIVE METABOLIC PANEL
ALT: 8 U/L (ref 0–35)
AST: 15 U/L (ref 0–37)
Albumin: 3.1 g/dL — ABNORMAL LOW (ref 3.5–5.2)
Alkaline Phosphatase: 25 U/L — ABNORMAL LOW (ref 39–117)
BILIRUBIN TOTAL: 0.2 mg/dL — AB (ref 0.3–1.2)
BUN: 38 mg/dL — AB (ref 6–23)
CHLORIDE: 101 meq/L (ref 96–112)
CO2: 23 mEq/L (ref 19–32)
Calcium: 8.3 mg/dL — ABNORMAL LOW (ref 8.4–10.5)
Creatinine, Ser: 2.51 mg/dL — ABNORMAL HIGH (ref 0.50–1.10)
GFR calc non Af Amer: 17 mL/min — ABNORMAL LOW (ref >90)
GFR, EST AFRICAN AMERICAN: 20 mL/min — AB (ref >90)
GLUCOSE: 258 mg/dL — AB (ref 70–99)
POTASSIUM: 5.1 meq/L (ref 3.7–5.3)
Sodium: 138 mEq/L (ref 137–147)
Total Protein: 6.2 g/dL (ref 6.0–8.3)

## 2014-05-22 MED ORDER — BIOTENE DRY MOUTH MT LIQD
15.0000 mL | Freq: Two times a day (BID) | OROMUCOSAL | Status: DC
  Administered 2014-05-22: 15 mL via OROMUCOSAL

## 2014-05-22 MED ORDER — CEPHALEXIN 500 MG PO CAPS: 500.0000 mg | ORAL_CAPSULE | Freq: Two times a day (BID) | ORAL | Status: DC

## 2014-05-22 NOTE — Discharge Summary (Signed)
Physician Discharge Summary  [name] GBT:[id number] DOB: [birth year] DOA: 05/21/2014  PCP: Kirk Ruths, MD  Admit date: 05/21/2014 Discharge date: 05/22/2014  Time spent: 45 minutes  Recommendations for Outpatient Follow-up:  Patient will be discharged home. She is to follow up with her primary care physician within one week of discharge. Patient should also have a CBC as well as a BMP conducted within one week of discharge. Patient to continue taking her medications as prescribed. Patient is very adamant about going home, she was instructed to return to emergency department if her symptoms returned or persisted.   Discharge Diagnoses:  Principal Problem:   Sepsis secondary to UTI (lower urinary tract infection) Active Problems:   DIABETES MELLITUS, TYPE II   Atrial flutter   Inflammatory carcinoma of right breast   Hypertension   Chronic kidney disease   Acute on chronic renal failure  Discharge Condition: Stable   Diet recommendation: Heart healthy/carb modified  Filed Weights   05/21/14 1859 05/21/14 2310  Weight: 105.235 kg (232 lb) 105.088 kg (231 lb 10.8 oz)    History of present illness:  [name] is a 80 y.o. female with a past medical history of diabetes, hypertension, aortic stenosis, chronic kidney disease, breast cancer who was in her usual state of health until she began to have pain in her left flank area. She has had few episodes of nausea and vomiting as well. She has experienced burning sensation with urination. Patient was given narcotics for her pain and so, she was somewhat confused at the time of admission, therefore, history was limited. She denied any pain currently. She did not have any nausea, vomiting in the emergency department. She denied any fever or chills at home, but was found to have a temperature of 103F in the ED. Denied any syncopal episodes. No dizziness or lightheadedness. She reported a history of falls at home though none recently.  She uses a wheelchair in the house. Denied any headaches.  Hospital Course:  Sepsis secondary to urinary tract infection/acute pyelonephritis -Patient was febrile and tachycardic on admission as well as has leukocytosis -UA: WBC 21-50, many bacteria, small leukocytes, positive nitrites -Urine culture pending and should be followed up by her PCP -Patient was placed on ceftriaxone -Patient will be discharged with keflex -sepsis currently improving, no longer febrile or tachycardic -Leukocytosis trending downward  Acute on chronic renal failure, Stage 3-4 -Creatinine appears to be about baseline -Patient will need to have repeat BMP and followed by PCP  Nausea and vomiting -Likely secondary to urinary tract infection and pyelonephritis -Resolved -CT scan of the abdomen and pelvis showed no acute process  Diabetes mellitus type 2 -Hemoglobin A1c pending -Patient was placed on Lantus and insulin sliding scale during hospitalization, may continue glipizide upon discharge  History of atrial flutter, mild aortic stenosis -Patient does follow up with cardiologist as an outpatient  Hyperlipidemia -Continue Crestor and fenofibrate  Hypertension -Controlled, continue metoprolol  Hypoxia -Resolved, Likely secondary to narcotics -Patient currently oxygenating and maintaining her saturations in the upper 90s to 100  History breast cancer -Currently in remission, continue Evista  Normocytic Anemia -Likely secondary to chronic disease -H/H remain stable and appear to be at baseline  Procedures: None  Consultations: None  Discharge Exam: Filed Vitals:   05/22/14 0605  BP: 106/64  Pulse: 70  Temp: 97.9 F (36.6 C)  Resp: 20     General: Well developed, well nourished, NAD, appears stated age  HEENT: NCAT, PERRLA, EOMI,  Anicteic Sclera, mucous membranes moist.  Neck: Supple, no JVD, no masses  Cardiovascular: S1 S2 auscultated, no rubs, murmurs or gallops. Regular rate  and rhythm.  Respiratory: Clear to auscultation bilaterally with equal chest rise  Abdomen: Soft, obese, nontender, nondistended, + bowel sounds  Extremities: warm dry without cyanosis clubbing or edema  Neuro: AAOx3, cranial nerves grossly intact. Strength equal and bilateral in upper/lower ext  Skin: Without rashes exudates or nodules  Psych: Normal affect and demeanor with intact judgement and insight  Discharge Instructions      Discharge Instructions   Diet - low sodium heart healthy    Complete by:  As directed      Diet Carb Modified    Complete by:  As directed      Discharge instructions    Complete by:  As directed   Patient will be discharged home. She is to follow up with her primary care physician within one week of discharge. Patient should also have a CBC as well as a BMP conducted within one week of discharge. Patient to continue taking her medications as prescribed. Patient is very adamant about going home, she was instructed to return to emergency department if her symptoms returned or persisted.     Increase activity slowly    Complete by:  As directed             Medication List    STOP taking these medications       GARCINIA CAMBOGIA-CHROMIUM PO     potassium chloride SA 20 MEQ tablet  Commonly known as:  K-DUR,KLOR-CON      TAKE these medications       albuterol 108 (90 BASE) MCG/ACT inhaler  Commonly known as:  PROVENTIL HFA;VENTOLIN HFA  Inhale 2 puffs into the lungs every 6 (six) hours as needed for wheezing or shortness of breath.     aspirin EC 81 MG tablet  Take 81 mg by mouth daily.     cephALEXin 500 MG capsule  Commonly known as:  KEFLEX  Take 1 capsule (500 mg total) by mouth 2 (two) times daily.     cyclobenzaprine 10 MG tablet  Commonly known as:  FLEXERIL  Take 10 mg by mouth 3 (three) times daily as needed for muscle spasms.     esomeprazole 40 MG capsule  Commonly known as:  NEXIUM  Take 40 mg by mouth every morning.      fenofibrate 145 MG tablet  Commonly known as:  TRICOR  Take 145 mg by mouth daily.     glipiZIDE 10 MG tablet  Commonly known as:  GLUCOTROL  Take 10 mg by mouth 2 (two) times daily.     loperamide 2 MG capsule  Commonly known as:  IMODIUM  Take 2 mg by mouth every 6 (six) hours as needed for diarrhea or loose stools (*Not to exceed 4 capsules per day*).     metoprolol 50 MG tablet  Commonly known as:  LOPRESSOR  Take 50 mg by mouth 2 (two) times daily.     PARoxetine 40 MG tablet  Commonly known as:  PAXIL  Take 40 mg by mouth every morning.     pregabalin 50 MG capsule  Commonly known as:  LYRICA  Take 50 mg by mouth 4 (four) times daily.     raloxifene 60 MG tablet  Commonly known as:  EVISTA  Take 60 mg by mouth daily.     rosuvastatin 40 MG tablet  Commonly known  as:  CRESTOR  Take 40 mg by mouth at bedtime.     traMADol 50 MG tablet  Commonly known as:  ULTRAM  Take 50-100 mg by mouth every 4 (four) hours as needed for moderate pain or severe pain.       Allergies  Allergen Reactions  . Imodium [Loperamide Hcl] Other (See Comments)    Had a reaction (unknown) but has never taken it again   . Mold Extract [Trichophyton Mentagrophyte]     Mildew, dust  . Niaspan [Niacin] Other (See Comments)    flushing   Follow-up Information   Follow up with Kirk Ruths, MD. Schedule an appointment as soon as possible for a visit in 1 week. [facility] followup)    Specialty:  Family Medicine   Contact information:   [address] [place] [address] [phone]        The results of significant diagnostics from this hospitalization (including imaging, microbiology, ancillary and laboratory) are listed below for reference.    Significant Diagnostic Studies: Ct Abdomen Pelvis Wo Contrast  05/21/2014   CLINICAL DATA:  Left flank pain  EXAM: CT ABDOMEN AND PELVIS WITHOUT CONTRAST  TECHNIQUE: Multidetector CT imaging of the abdomen and pelvis was  performed following the standard protocol without IV contrast.  COMPARISON:  Renal ultrasound 02/06/2010, lumbar spine CT 02/04/2010  FINDINGS: Scarring versus atelectasis within the lung bases. Heart is enlarged. Atherosclerotic calcifications within the coronary vessels.  Noncontrast evaluation of the liver, spleen, adrenals, pancreas is unremarkable. Patient status post cholecystectomy.  Stable left renal cyst. Kidneys otherwise unremarkable. Specifically no evidence of obstructive uropathy nor calculus disease.  Atherosclerotic calcifications of the abdominal aorta, mesenteric and iliac vessels. No evidence of abdominal aortic aneurysm.  Postsurgical and chronic changes within the lumbar spine. Spinal hardware appreciated. Chronic superior endplate depression of L1. Multilevel spondylosis within the thoracic spine. No aggressive appearing osseous lesions.  Small fat containing umbilical hernia.  No inguinal hernia.  IMPRESSION: Scarring versus atelectasis within the lung bases  Atherosclerotic disease within the coronary vessels, aorta, and mesenteric within the  Within the limitations of a noncontrast CT otherwise no evidence of abdominal or pelvic pathology. Specifically no evidence of renal calculus disease nor obstructive uropathy.   Electronically Signed   By: Salome Holmes M.D.   On: 05/21/2014 20:25   Dg Chest 2 View  05/21/2014   CLINICAL DATA:  FLANK PAIN  EXAM: CHEST  2 VIEW  COMPARISON:  Two-view chest 05/19/2012  FINDINGS: Low lung volumes. Cardiac silhouette is enlarged. Stable left central venous catheter. Minimal areas of linear density within the lung bases. No focal regions of consolidation or focal infiltrates. No acute osseus abnormalities.  IMPRESSION: Scarring versus atelectasis within the lung bases. No acute cardiopulmonary disease.   Electronically Signed   By: Salome Holmes M.D.   On: 05/21/2014 21:49    Microbiology: No results found for this or any previous visit (from the  past 240 hour(s)).   Labs: Basic Metabolic Panel:  Recent Labs Lab 05/21/14 1951 05/22/14 0536  NA 140 138  K 5.0 5.1  CL 103 101  CO2 22 23  GLUCOSE 168* 258*  BUN 34* 38*  CREATININE 2.21* 2.51*  CALCIUM 9.1 8.3*   Liver Function Tests:  Recent Labs Lab 05/21/14 1951 05/22/14 0536  AST 13 15  ALT 11 8  ALKPHOS 35* 25*  BILITOT 0.4 0.2*  PROT 6.9 6.2  ALBUMIN 3.6 3.1*   No results found for this basename:  LIPASE, AMYLASE,  in the last 168 hours No results found for this basename: AMMONIA,  in the last 168 hours CBC:  Recent Labs Lab 05/21/14 1951 05/22/14 0536 05/22/14 1025  WBC 6.3 18.1* 16.9*  NEUTROABS 5.6  --   --   HGB 11.7* 10.4* 10.0*  HCT 36.2 32.6* 31.2*  MCV 89.2 89.3 89.1  PLT 165 175 191   Cardiac Enzymes: No results found for this basename: CKTOTAL, CKMB, CKMBINDEX, TROPONINI,  in the last 168 hours BNP: BNP (last 3 results) No results found for this basename: PROBNP,  in the last 8760 hours CBG:  Recent Labs Lab 05/21/14 2352 05/22/14 0756  GLUCAP 158* 227*       Signed:  Briante Loveall  [facility] 05/22/2014, 10:53 AM

## 2014-05-22 NOTE — Progress Notes (Signed)
Pt verbalizes understanding of d/c instructions, medications,  follow up info, and when to call the doctor. Pt has no questions at this time. IV d/c without complications. Prescription was called in. O2 sats resting on room air were 95%. Foley catheter was removed and pt urinated prior to d/c. Pt d/c via wheelchair accompanied by NT, he son picked her up. [name]

## 2014-05-22 NOTE — Progress Notes (Signed)
Late entry: pt called shortly after d/c stating that she left her medications here. Her home medications were stored in our pharmacy. Medications were retrieved from pharmacy and her son [name] picked them up at the nurses station. [name]

## 2014-05-22 NOTE — Discharge Instructions (Signed)

## 2014-05-25 NOTE — Care Management Utilization Note (Signed)
UR completed 

## 2014-06-14 NOTE — Progress Notes (Signed)
Leonides Grills, MD [place] [address]  Inflammatory carcinoma of breast, right - Plan: EKG 12-Lead  CURRENT THERAPY: Surveillance per NCCN guidelines  INTERVAL HISTORY: [name] 80 y.o. 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 This was an ER, PR, negative cancer but HER-2/neu 3+ positive. She was treated with FEC x4 cycles followed by surgery. We then treated with Taxotere navelbine along with Herceptin the latter for 52 weeks. She was able to tolerate only 2 cycles of Taxotere navelbine of a planned 4 cycles. She declined radiation therapy however but finished all of her Herceptin.  I personally reviewed and went over laboratory results with the patient.  The results are noted within this dictation.  [place] was in the hospital from 6/19- 6/20 with sepsis secondary to UTI.  With this being said, I do not see a urine culture nor a blood culture, therefore, I am not sure how this was concluded at her diagnosis.  Her last mammogram was in Nov 2014 and she is reminded that she is due this November 2015.   She reports bilateral shoulder pain with right lower tooth pain.  She explains that she has an appointment for a dentist in the near future, but given her symptoms, it is important for [place] to perform an EKG to rule out any cardiac origin of these complaints.  She does not see her primary care provider often, per her own words, and therefore she has run out of her albuterol.  I will refill and I requested future refills be provided by her primary care provider.  Otherwise, she denies any complaints and ROS questioning is negative.  Past Medical History  Diagnosis Date  . Breast carcinoma   . Anemia   . IBS (irritable bowel syndrome)     diverticulosis, gastroesophageal reflux disease  . Hypertension   . Asthma   . Diabetes mellitus   . Hyperlipidemia   . Arthritis   .  Headaches, cluster   . Lower back pain   . Aortic stenosis     Mild  . Chronic kidney disease     Creatinine-1.5 in 2010 and 2.5-3 in 2011; 1.5-10/2010;  Klebsiella UTI-10/2010. urine protein 36 mg/dl, mildly elevated  . Depression   . Fibromyalgia   . Cerebrovascular disease     with a 62% LICA; repeat study in 10/2009-no obstructive disease; modest ASVD  . Falls infrequently 01/2010    fracture of pelvis and left humerus  . Atrial flutter 2002  . DJD (degenerative joint disease) 11/14/2011  . Obesity 11/14/2011  . Diabetic retinopathy   . Decreased bone density     has DIABETES MELLITUS, TYPE II; HYPERLIPIDEMIA; DEPRESSION/ANXIETY; Atrial flutter; SPINAL STENOSIS, LUMBAR; Inflammatory carcinoma of right breast; Anemia; IBS (irritable bowel syndrome); Hypertension; Aortic stenosis; Chronic kidney disease; Fibromyalgia; Cerebrovascular disease; Falls infrequently; DJD (degenerative joint disease); Obesity; UTI (lower urinary tract infection); and Acute on chronic renal failure on her problem list.     is allergic to imodium; mold extract; and niaspan.  Ms. Paganelli does not currently have medications on file.  Past Surgical History  Procedure Laterality Date  . Mastectomy      Right breast for carcinoma  . Lumbar disc surgery      lumbosacral spine procedure x 2  . Appendectomy    . Cholecystectomy    . Cataract extraction, bilateral      Lens implants  .  Central venous catheter tunneled insertion single lumen    . Colonoscopy  2012  . Eye surgery  nov 2012    for diabetic retinopathy    Denies any headaches, dizziness, double vision, fevers, chills, night sweats, nausea, vomiting, diarrhea, constipation, chest pain, heart palpitations, shortness of breath, blood in stool, black tarry stool, urinary pain, urinary burning, urinary frequency, hematuria.   PHYSICAL EXAMINATION  ECOG PERFORMANCE STATUS: 2 - Symptomatic, <50% confined to bed  Filed Vitals:   06/16/14 1500  BP:  167/54  Pulse: 84  Temp: 98.1 F (36.7 C)  Resp: 18    GENERAL:alert, well nourished, well developed, comfortable, cooperative, obese and smiling SKIN: skin color, texture, turgor are normal, no rashes or significant lesions, moist to the touch HEAD: Normocephalic, No masses, lesions, tenderness or abnormalities EYES: normal, PERRLA, EOMI, Conjunctiva are pink and non-injected EARS: External ears normal OROPHARYNX:mucous membranes are moist and poor dentition  NECK: supple, no adenopathy, thyroid normal size, non-tender, without nodularity, no stridor, non-tender, trachea midline LYMPH:  no palpable lymphadenopathy BREAST:not examined LUNGS: clear to auscultation  HEART: regular rate & rhythm, no murmurs, no gallops, S1 normal, S2 normal and physiological split S2 ABDOMEN:abdomen soft, non-tender, obese and normal bowel sounds BACK: Back symmetric, no curvature. EXTREMITIES:less then 2 second capillary refill, no joint deformities, effusion, or inflammation, no skin discoloration, no clubbing, no cyanosis  NEURO: alert & oriented x 3 with fluent speech, no focal motor/sensory deficits, in a wheelchair   LABORATORY DATA: CBC    Component Value Date/Time   WBC 16.9* 05/22/2014 1025   RBC 3.50* 05/22/2014 1025   HGB 10.0* 05/22/2014 1025   HCT 31.2* 05/22/2014 1025   PLT 191 05/22/2014 1025   MCV 89.1 05/22/2014 1025   MCH 28.6 05/22/2014 1025   MCHC 32.1 05/22/2014 1025   RDW 13.5 05/22/2014 1025   LYMPHSABS 0.3* 05/21/2014 1951   MONOABS 0.0* 05/21/2014 1951   EOSABS 0.3 05/21/2014 1951   BASOSABS 0.0 05/21/2014 1951      Chemistry      Component Value Date/Time   NA 138 05/22/2014 1025   K 4.6 05/22/2014 1025   CL 102 05/22/2014 1025   CO2 24 05/22/2014 1025   BUN 39* 05/22/2014 1025   CREATININE 2.53* 05/22/2014 1025   CREATININE 1.36* 07/26/2011 0910      Component Value Date/Time   CALCIUM 8.2* 05/22/2014 1025   ALKPHOS 25* 05/22/2014 0536   AST 15 05/22/2014 0536   ALT 8  05/22/2014 0536   BILITOT 0.2* 05/22/2014 0536        ASSESSMENT:  1. 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 This was an ER, PR, negative cancer but HER-2/neu 3+ positive. She was treated with FEC x4 cycles followed by surgery. We then treated with Taxotere navelbine along with Herceptin the latter for 52 weeks. She was able to tolerate only 2 cycles of Taxotere navelbine of a planned 4 cycles. She declined radiation therapy however but finished all of her Herceptin. 2. Bilateral shoulder pain and right jaw/tooth pain  Patient Active Problem List   Diagnosis Date Noted  . UTI (lower urinary tract infection) 05/21/2014  . Acute on chronic renal failure 05/21/2014  . DJD (degenerative joint disease) 11/14/2011  . Obesity 11/14/2011  . Inflammatory carcinoma of right breast   . Anemia   . IBS (irritable bowel syndrome)   . Hypertension   . Aortic stenosis   . Chronic kidney disease   .  Fibromyalgia   . Cerebrovascular disease   . Atrial flutter 07/14/2010  . Falls infrequently 01/31/2010  . HYPERLIPIDEMIA 11/15/2009  . DIABETES MELLITUS, TYPE II 10/12/2009  . DEPRESSION/ANXIETY 10/12/2009  . SPINAL STENOSIS, LUMBAR 10/12/2009     PLAN:  1. I personally reviewed and went over laboratory results with the patient.  2. I personally reviewed and went over radiographic studies with the patient.  3. Next screening mammogram is due in November 2015. 4. Follow-up with PCP as directed 5. EKG today- EKG demonstrates normal sinus rhythm with good r-wave progression.  Occasional PVCs noted. 6. Rx for Albuterol with 3 refills.  Patient asked to request furure refills from her primary care provider.  7. Return in 1 year for follow-up   THERAPY PLAN:  NCCN guidelines recommends the following surveillance for invasive breast cancer:  A. History and Physical exam every 4-6 months for 5 years and then every 12 months.  B. Mammography every 12 months  C.  Women on Tamoxifen: annual gynecologic assessment every 12 months if uterus is present.  D. Women on aromatase inhibitor or who experience ovarian failure secondary to treatment should have monitoring of bone health with a bone mineral density determination at baseline and periodically thereafter.  E. Assess and encourage adherence to adjuvant endocrine therapy.  F. Evidence suggests that active lifestyle and achieving and maintaining an ideal body weight (20-25 BMI) may lead to optimal breast cancer outcomes.  All questions were answered. The patient knows to call the clinic with any problems, questions or concerns. We can certainly see the patient much sooner if necessary.   Patient and plan discussed with Dr. Farrel Gobble and he is in agreement with the aforementioned.    Helaina Stefano 06/16/2014

## 2014-06-16 ENCOUNTER — Encounter (HOSPITAL_COMMUNITY): Payer: Medicare Other | Attending: Oncology | Admitting: Oncology

## 2014-06-16 ENCOUNTER — Encounter (HOSPITAL_COMMUNITY): Payer: Medicare Other

## 2014-06-16 ENCOUNTER — Other Ambulatory Visit: Payer: Self-pay

## 2014-06-16 ENCOUNTER — Encounter (HOSPITAL_COMMUNITY): Payer: Self-pay | Admitting: Oncology

## 2014-06-16 VITALS — BP 167/54 | HR 84 | Temp 98.1°F | Resp 18 | Wt 238.0 lb

## 2014-06-16 DIAGNOSIS — M25519 Pain in unspecified shoulder: Secondary | ICD-10-CM | POA: Insufficient documentation

## 2014-06-16 DIAGNOSIS — J449 Chronic obstructive pulmonary disease, unspecified: Secondary | ICD-10-CM

## 2014-06-16 DIAGNOSIS — Z9010 Acquired absence of unspecified breast and nipple: Secondary | ICD-10-CM | POA: Diagnosis not present

## 2014-06-16 DIAGNOSIS — C50911 Malignant neoplasm of unspecified site of right female breast: Secondary | ICD-10-CM

## 2014-06-16 DIAGNOSIS — K089 Disorder of teeth and supporting structures, unspecified: Secondary | ICD-10-CM | POA: Insufficient documentation

## 2014-06-16 DIAGNOSIS — Z452 Encounter for adjustment and management of vascular access device: Secondary | ICD-10-CM

## 2014-06-16 DIAGNOSIS — Z853 Personal history of malignant neoplasm of breast: Secondary | ICD-10-CM | POA: Diagnosis not present

## 2014-06-16 MED ORDER — HEPARIN SOD (PORK) LOCK FLUSH 100 UNIT/ML IV SOLN
500.0000 [IU] | Freq: Once | INTRAVENOUS | Status: AC
  Administered 2014-06-16: 500 [IU] via INTRAVENOUS
  Filled 2014-06-16: qty 5

## 2014-06-16 MED ORDER — ALBUTEROL SULFATE HFA 108 (90 BASE) MCG/ACT IN AERS: 2.0000 | INHALATION_SPRAY | Freq: Four times a day (QID) | RESPIRATORY_TRACT | Status: AC | PRN

## 2014-06-16 MED ORDER — SODIUM CHLORIDE 0.9 % IJ SOLN
10.0000 mL | INTRAMUSCULAR | Status: DC | PRN
  Administered 2014-06-16: 10 mL via INTRAVENOUS

## 2014-06-16 NOTE — Patient Instructions (Signed)
[name] Discharge Instructions  RECOMMENDATIONS MADE BY THE CONSULTANT AND ANY TEST RESULTS WILL BE SENT TO YOUR REFERRING PHYSICIAN.  MEDICATIONS PRESCRIBED:  Refill on Albuterol (ProAir) with three refills.  We request that you ascertain future refills from your primary care provider.  INSTRUCTIONS GIVEN AND DISCUSSED: EKG today was normal.  SPECIAL INSTRUCTIONS/FOLLOW-UP: Return for follow-up in 1 year.  Thank you for choosing [facility] to provide your oncology and hematology care.  To afford each patient quality time with our providers, please arrive at least 15 minutes before your scheduled appointment time.  With your help, our goal is to use those 15 minutes to complete the necessary work-up to ensure our physicians have the information they need to help with your evaluation and healthcare recommendations.    Effective January 1st, 2014, we ask that you re-schedule your appointment with our physicians should you arrive 10 or more minutes late for your appointment.  We strive to give you quality time with our providers, and arriving late affects you and other patients whose appointments are after yours.    Again, thank you for choosing [facility].  Our hope is that these requests will decrease the amount of time that you wait before being seen by our physicians.       _____________________________________________________________  Should you have questions after your visit to [facility], please contact our office at [phone] between the hours of 8:30 a.m. and 5:00 p.m.  Voicemails left after 4:30 p.m. will not be returned until the following business day.  For prescription refill requests, have your pharmacy contact our office with your prescription refill request.

## 2014-06-16 NOTE — Progress Notes (Signed)
[name] presented for Portacath access and flush. Proper placement of portacath confirmed by CXR. Portacath located left chest wall accessed with  H 20 needle. Good blood return present. Portacath flushed with 58ml NS and 500U/5ml Heparin and needle removed intact. Procedure without incident. Patient tolerated procedure well.

## 2014-06-29 ENCOUNTER — Other Ambulatory Visit: Payer: Self-pay | Admitting: Cardiovascular Disease

## 2014-07-28 ENCOUNTER — Encounter (HOSPITAL_COMMUNITY): Payer: Medicare Other

## 2014-08-06 ENCOUNTER — Encounter (HOSPITAL_COMMUNITY): Payer: Medicare Other | Attending: Oncology

## 2014-08-06 DIAGNOSIS — Z438 Encounter for attention to other artificial openings: Secondary | ICD-10-CM

## 2014-08-06 DIAGNOSIS — Z853 Personal history of malignant neoplasm of breast: Secondary | ICD-10-CM | POA: Insufficient documentation

## 2014-08-06 DIAGNOSIS — M25519 Pain in unspecified shoulder: Secondary | ICD-10-CM | POA: Insufficient documentation

## 2014-08-06 DIAGNOSIS — K089 Disorder of teeth and supporting structures, unspecified: Secondary | ICD-10-CM | POA: Insufficient documentation

## 2014-08-06 DIAGNOSIS — Z9010 Acquired absence of unspecified breast and nipple: Secondary | ICD-10-CM | POA: Insufficient documentation

## 2014-08-06 MED ORDER — HEPARIN SOD (PORK) LOCK FLUSH 100 UNIT/ML IV SOLN
500.0000 [IU] | Freq: Once | INTRAVENOUS | Status: AC
  Administered 2014-08-06: 500 [IU] via INTRAVENOUS
  Filled 2014-08-06: qty 5

## 2014-08-06 MED ORDER — SODIUM CHLORIDE 0.9 % IJ SOLN
10.0000 mL | INTRAMUSCULAR | Status: DC | PRN
  Administered 2014-08-06: 10 mL via INTRAVENOUS

## 2014-08-06 NOTE — Progress Notes (Signed)
[name] presented for Portacath access and flush. Portacath located left chest wall accessed with  H 20 needle.  No blood return present - flushes w/o difficulty with no c/o pain at site, no s/s of infiltration at site. Portacath flushed with 25ml NS and 500U/31ml Heparin and needle removed intact.  Procedure tolerated well and without incident.

## 2014-09-02 ENCOUNTER — Other Ambulatory Visit: Payer: Self-pay | Admitting: Cardiovascular Disease

## 2014-09-08 ENCOUNTER — Encounter (HOSPITAL_COMMUNITY): Payer: Medicare Other

## 2014-09-27 ENCOUNTER — Encounter (HOSPITAL_COMMUNITY): Payer: Self-pay | Admitting: Emergency Medicine

## 2014-09-27 ENCOUNTER — Emergency Department (HOSPITAL_COMMUNITY)
Admission: EM | Admit: 2014-09-27 | Discharge: 2014-09-27 | Disposition: A | Discharge status: Home / Self Care | Payer: Medicare Other | Attending: Emergency Medicine | Admitting: Emergency Medicine

## 2014-09-27 DIAGNOSIS — E11331 Type 2 diabetes mellitus with moderate nonproliferative diabetic retinopathy with macular edema: Secondary | ICD-10-CM | POA: Insufficient documentation

## 2014-09-27 DIAGNOSIS — Z862 Personal history of diseases of the blood and blood-forming organs and certain disorders involving the immune mechanism: Secondary | ICD-10-CM | POA: Insufficient documentation

## 2014-09-27 DIAGNOSIS — Z8719 Personal history of other diseases of the digestive system: Secondary | ICD-10-CM | POA: Diagnosis not present

## 2014-09-27 DIAGNOSIS — Z7982 Long term (current) use of aspirin: Secondary | ICD-10-CM | POA: Insufficient documentation

## 2014-09-27 DIAGNOSIS — R112 Nausea with vomiting, unspecified: Secondary | ICD-10-CM | POA: Insufficient documentation

## 2014-09-27 DIAGNOSIS — R51 Headache: Secondary | ICD-10-CM | POA: Insufficient documentation

## 2014-09-27 DIAGNOSIS — E669 Obesity, unspecified: Secondary | ICD-10-CM | POA: Insufficient documentation

## 2014-09-27 DIAGNOSIS — Z79899 Other long term (current) drug therapy: Secondary | ICD-10-CM | POA: Diagnosis not present

## 2014-09-27 DIAGNOSIS — Z853 Personal history of malignant neoplasm of breast: Secondary | ICD-10-CM | POA: Insufficient documentation

## 2014-09-27 DIAGNOSIS — J45909 Unspecified asthma, uncomplicated: Secondary | ICD-10-CM | POA: Insufficient documentation

## 2014-09-27 DIAGNOSIS — F329 Major depressive disorder, single episode, unspecified: Secondary | ICD-10-CM | POA: Diagnosis not present

## 2014-09-27 DIAGNOSIS — M1990 Unspecified osteoarthritis, unspecified site: Secondary | ICD-10-CM | POA: Insufficient documentation

## 2014-09-27 DIAGNOSIS — E785 Hyperlipidemia, unspecified: Secondary | ICD-10-CM | POA: Diagnosis not present

## 2014-09-27 DIAGNOSIS — N189 Chronic kidney disease, unspecified: Secondary | ICD-10-CM | POA: Insufficient documentation

## 2014-09-27 DIAGNOSIS — I129 Hypertensive chronic kidney disease with stage 1 through stage 4 chronic kidney disease, or unspecified chronic kidney disease: Secondary | ICD-10-CM | POA: Insufficient documentation

## 2014-09-27 LAB — CBC WITH DIFFERENTIAL/PLATELET
BASOS PCT: 1 % (ref 0–1)
Basophils Absolute: 0 10*3/uL (ref 0.0–0.1)
EOS ABS: 0.3 10*3/uL (ref 0.0–0.7)
EOS PCT: 4 % (ref 0–5)
HEMATOCRIT: 36.2 % (ref 36.0–46.0)
HEMOGLOBIN: 11.7 g/dL — AB (ref 12.0–15.0)
Lymphocytes Relative: 41 % (ref 12–46)
Lymphs Abs: 2.8 10*3/uL (ref 0.7–4.0)
MCH: 28.4 pg (ref 26.0–34.0)
MCHC: 32.3 g/dL (ref 30.0–36.0)
MCV: 87.9 fL (ref 78.0–100.0)
MONOS PCT: 6 % (ref 3–12)
Monocytes Absolute: 0.4 10*3/uL (ref 0.1–1.0)
Neutro Abs: 3.3 10*3/uL (ref 1.7–7.7)
Neutrophils Relative %: 48 % (ref 43–77)
Platelets: 254 10*3/uL (ref 150–400)
RBC: 4.12 MIL/uL (ref 3.87–5.11)
RDW: 13.2 % (ref 11.5–15.5)
WBC: 6.9 10*3/uL (ref 4.0–10.5)

## 2014-09-27 LAB — URINALYSIS, ROUTINE W REFLEX MICROSCOPIC
Bilirubin Urine: NEGATIVE
Glucose, UA: 100 mg/dL — AB
HGB URINE DIPSTICK: NEGATIVE
KETONES UR: NEGATIVE mg/dL
LEUKOCYTES UA: NEGATIVE
Nitrite: NEGATIVE
PROTEIN: 30 mg/dL — AB
Specific Gravity, Urine: >1.03 — ABNORMAL HIGH (ref 1.005–1.030)
Urobilinogen, UA: 0.2 mg/dL (ref 0.0–1.0)
pH: 5.5 (ref 5.0–8.0)

## 2014-09-27 LAB — COMPREHENSIVE METABOLIC PANEL
ALBUMIN: 3.9 g/dL (ref 3.5–5.2)
ALT: 11 U/L (ref 0–35)
ANION GAP: 13 (ref 5–15)
AST: 15 U/L (ref 0–37)
Alkaline Phosphatase: 32 U/L — ABNORMAL LOW (ref 39–117)
BUN: 33 mg/dL — ABNORMAL HIGH (ref 6–23)
CO2: 25 mEq/L (ref 19–32)
Calcium: 9.9 mg/dL (ref 8.4–10.5)
Chloride: 101 mEq/L (ref 96–112)
Creatinine, Ser: 1.96 mg/dL — ABNORMAL HIGH (ref 0.50–1.10)
GFR calc non Af Amer: 23 mL/min — ABNORMAL LOW (ref >90)
GFR, EST AFRICAN AMERICAN: 27 mL/min — AB (ref >90)
Glucose, Bld: 277 mg/dL — ABNORMAL HIGH (ref 70–99)
Potassium: 5.3 mEq/L (ref 3.7–5.3)
Sodium: 139 mEq/L (ref 137–147)
TOTAL PROTEIN: 7.4 g/dL (ref 6.0–8.3)
Total Bilirubin: 0.3 mg/dL (ref 0.3–1.2)

## 2014-09-27 LAB — LIPASE, BLOOD: Lipase: 23 U/L (ref 11–59)

## 2014-09-27 LAB — TROPONIN I: Troponin I: <0.3 ng/mL (ref <0.30)

## 2014-09-27 LAB — URINE MICROSCOPIC-ADD ON

## 2014-09-27 MED ORDER — ONDANSETRON 8 MG PO TBDP: 8.0000 mg | ORAL_TABLET | Freq: Three times a day (TID) | ORAL | Status: DC | PRN

## 2014-09-27 MED ORDER — SODIUM CHLORIDE 0.9 % IV BOLUS (SEPSIS)
500.0000 mL | Freq: Once | INTRAVENOUS | Status: AC
  Administered 2014-09-27: 500 mL via INTRAVENOUS

## 2014-09-27 MED ORDER — ONDANSETRON HCL 4 MG/2ML IJ SOLN
4.0000 mg | Freq: Once | INTRAMUSCULAR | Status: AC
  Administered 2014-09-27: 4 mg via INTRAVENOUS
  Filled 2014-09-27: qty 2

## 2014-09-27 MED ORDER — FENTANYL CITRATE 0.05 MG/ML IJ SOLN
50.0000 ug | Freq: Once | INTRAMUSCULAR | Status: AC
  Administered 2014-09-27: 50 ug via INTRAVENOUS
  Filled 2014-09-27: qty 2

## 2014-09-27 NOTE — ED Provider Notes (Signed)
CSN: [id number]     Arrival date & time 09/27/14  1323 History  This chart was scribed for [employer]. Alvino Chapel, MD by Rayfield Citizen, ED Scribe. This patient was seen in room APA18/APA18 and the patient's care was started at 2:38 PM.     Chief Complaint  Patient presents with  . Emesis   The history is provided by the patient. No language interpreter was used.    HPI Comments: [name] is a 80 y.o. female who presents to the Emergency Department complaining of 4 days of nausea and vomiting, as well as headache. She reports that her symptoms began 1 week ago with a strange sensation of a racing heart, beginning suddenly while eating dinner and continuing intermittently throughout the week. She also reports dizziness (room spinning). She denies urinary symptoms, fevers, chills, or diarrhea. She denies sick contacts.   She denies any relevant medical history.   Past Medical History  Diagnosis Date  . Anemia   . IBS (irritable bowel syndrome)     diverticulosis, gastroesophageal reflux disease  . Hypertension   . Asthma   . Diabetes mellitus   . Hyperlipidemia   . Arthritis   . Headaches, cluster   . Lower back pain   . Aortic stenosis     Mild  . Depression   . Fibromyalgia   . Cerebrovascular disease     with a 79% LICA; repeat study in 10/2009-no obstructive disease; modest ASVD  . Falls infrequently 01/2010    fracture of pelvis and left humerus  . Atrial flutter 2002  . DJD (degenerative joint disease) 11/14/2011  . Obesity 11/14/2011  . Diabetic retinopathy   . Decreased bone density   . Breast carcinoma   . Chronic kidney disease     Creatinine-1.5 in 2010 and 2.5-3 in 2011; 1.5-10/2010;  Klebsiella UTI-10/2010. urine protein 36 mg/dl, mildly elevated   Past Surgical History  Procedure Laterality Date  . Mastectomy      Right breast for carcinoma  . Lumbar disc surgery      lumbosacral spine procedure x 2  . Appendectomy    . Cholecystectomy    . Cataract  extraction, bilateral      Lens implants  . Central venous catheter tunneled insertion single lumen    . Colonoscopy  2012  . Eye surgery  nov 2012    for diabetic retinopathy   Family History  Problem Relation Age of Onset  . Alzheimer's disease Mother   . Heart attack Father   . Aneurysm Sister   . Coronary artery disease Brother    History  Substance Use Topics  . Smoking status: Former Research scientist (life sciences)  . Smokeless tobacco: Never Used  . Alcohol Use: No   OB History   Grav Para Term Preterm Abortions TAB SAB Ect Mult Living                 Review of Systems  Constitutional: Negative for fever and chills.  Gastrointestinal: Positive for nausea and vomiting. Negative for diarrhea.  Genitourinary: Negative for dysuria, urgency, frequency and hematuria.  Neurological: Positive for dizziness and headaches.  All other systems reviewed and are negative.     Allergies  Imodium; Mold extract; and Niaspan  Home Medications   Prior to Admission medications   Medication Sig Start Date End Date Taking? Authorizing Provider  lisinopril-hydrochlorothiazide (PRINZIDE,ZESTORETIC) 20-25 MG per tablet Take 1 tablet by mouth daily.   Yes Historical Provider, MD  rosuvastatin (CRESTOR) 40  MG tablet Take 40 mg by mouth at bedtime.   Yes Historical Provider, MD  albuterol (PROAIR HFA) 108 (90 BASE) MCG/ACT inhaler Inhale 2 puffs into the lungs every 6 (six) hours as needed for wheezing or shortness of breath. 06/16/14   Baird Cancer, PA-C  aspirin EC 81 MG tablet Take 81 mg by mouth daily.    Historical Provider, MD  cyclobenzaprine (FLEXERIL) 10 MG tablet Take 10 mg by mouth 3 (three) times daily as needed for muscle spasms.  08/29/11   Historical Provider, MD  esomeprazole (NEXIUM) 40 MG capsule Take 40 mg by mouth every morning.     Historical Provider, MD  fenofibrate (TRICOR) 145 MG tablet Take 145 mg by mouth daily.    Historical Provider, MD  furosemide (LASIX) 40 MG tablet Take 40 mg by  mouth daily. 09/02/14   Historical Provider, MD  glipiZIDE (GLUCOTROL) 10 MG tablet Take 10 mg by mouth 2 (two) times daily.     Historical Provider, MD  hydrochlorothiazide (MICROZIDE) 12.5 MG capsule Take 12.5 mg by mouth daily. 09/27/14   Historical Provider, MD  HYDROcodone-acetaminophen (NORCO) 10-325 MG per tablet Take 1 tablet by mouth every 4 (four) hours as needed. For pain 09/03/14   Historical Provider, MD  LANTUS SOLOSTAR 100 UNIT/ML Solostar Pen Inject into the skin daily at 10 pm.  09/02/14   Historical Provider, MD  loperamide (IMODIUM) 2 MG capsule Take 2 mg by mouth every 6 (six) hours as needed for diarrhea or loose stools (*Not to exceed 4 capsules per day*).     Historical Provider, MD  metoprolol (LOPRESSOR) 50 MG tablet Take 50 mg by mouth 2 (two) times daily.    Historical Provider, MD  ondansetron (ZOFRAN-ODT) 8 MG disintegrating tablet Take 1 tablet (8 mg total) by mouth every 8 (eight) hours as needed for nausea or vomiting. 09/27/14   Jasper Riling. Shanaye Rief, MD  PARoxetine (PAXIL) 40 MG tablet Take 40 mg by mouth every morning.      Historical Provider, MD  pregabalin (LYRICA) 50 MG capsule Take 50 mg by mouth 4 (four) times daily.     Historical Provider, MD  raloxifene (EVISTA) 60 MG tablet Take 60 mg by mouth daily.    Historical Provider, MD  traMADol (ULTRAM) 50 MG tablet Take 50-100 mg by mouth every 4 (four) hours as needed for moderate pain or severe pain.     Historical Provider, MD   BP 142/84  Pulse 91  Temp(Src) 98.1 F (36.7 C) (Oral)  Resp 20  Ht $R'5\' 10"'PO$  (1.778 m)  Wt 232 lb (105.235 kg)  BMI 33.29 kg/m2  SpO2 100% Physical Exam  Nursing note and vitals reviewed. Constitutional: She is oriented to person, place, and time. She appears well-developed and well-nourished.  HENT:  Head: Normocephalic and atraumatic.  Eyes: EOM are normal. Pupils are equal, round, and reactive to light. Right eye exhibits no nystagmus. Left eye exhibits no nystagmus.  Neck: No  tracheal deviation present.  Cardiovascular: Normal rate, regular rhythm and normal heart sounds.  Exam reveals no gallop and no friction rub.   No murmur heard. Pulmonary/Chest: Effort normal and breath sounds normal. No respiratory distress. She has no wheezes. She has no rales.  Abdominal: Soft. There is no tenderness. There is no rebound and no guarding.  Musculoskeletal:  Mild bilateral LE pitting edema   Neurological: She is alert and oriented to person, place, and time.  Skin: Skin is warm and dry.  Psychiatric:  She has a normal mood and affect. Her behavior is normal.    ED Course  Procedures   DIAGNOSTIC STUDIES: Oxygen Saturation is 100% on RA, normal by my interpretation.    COORDINATION OF CARE: 2:40 PM Discussed treatment plan with pt at bedside and pt agreed to plan.   Labs Review Labs Reviewed  CBC WITH DIFFERENTIAL - Abnormal; Notable for the following:    Hemoglobin 11.7 (*)    All other components within normal limits  COMPREHENSIVE METABOLIC PANEL - Abnormal; Notable for the following:    Glucose, Bld 277 (*)    BUN 33 (*)    Creatinine, Ser 1.96 (*)    Alkaline Phosphatase 32 (*)    GFR calc non Af Amer 23 (*)    GFR calc Af Amer 27 (*)    All other components within normal limits  URINALYSIS, ROUTINE W REFLEX MICROSCOPIC - Abnormal; Notable for the following:    Specific Gravity, Urine >1.030 (*)    Glucose, UA 100 (*)    Protein, ur 30 (*)    All other components within normal limits  TROPONIN I  LIPASE, BLOOD  URINE MICROSCOPIC-ADD ON    Imaging Review No results found.   EKG Interpretation   Date/Time:  Monday September 27 2014 15:03:58 EDT Ventricular Rate:  73 PR Interval:  160 QRS Duration: 88 QT Interval:  385 QTC Calculation: 424 R Axis:   26 Text Interpretation:  Sinus rhythm Abnormal R-wave progression, early  transition Baseline wander in lead(s) V2 Confirmed by Amiree No  MD,  Ranee Peasley [phone]) on 09/28/2014 10:23:33 PM       MDM   Final diagnoses:  Non-intractable vomiting with nausea, vomiting of unspecified type    Patient with nausea and vomiting. Creatinine is at baseline. Feels better after treatment and will be discharged home     Jasper Riling. Alvino Chapel, MD 09/28/14 2224

## 2014-09-27 NOTE — ED Notes (Signed)
Pt given water to drink. 

## 2014-09-27 NOTE — Discharge Instructions (Signed)

## 2014-09-27 NOTE — ED Notes (Signed)
Has not "felt good" for 6 days, vomiting for 4 days,  No diarrhea, no fever.   Took pepto bismol without relief

## 2014-09-27 NOTE — ED Notes (Signed)
Pt assisted to bathroom via wheelchair for urine spec.

## 2014-09-27 NOTE — ED Notes (Signed)
Pt seating in chair watching TV. Pt has had no nausea or vomiting since in the ED. Pt has been ablr to drink and eat crackers and keep them down

## 2014-10-20 ENCOUNTER — Encounter (HOSPITAL_COMMUNITY): Payer: Medicare Other

## 2014-11-01 ENCOUNTER — Encounter (HOSPITAL_COMMUNITY): Payer: Self-pay

## 2014-11-01 ENCOUNTER — Encounter (HOSPITAL_COMMUNITY): Payer: Medicare Other | Attending: Oncology

## 2014-11-01 VITALS — BP 124/43 | HR 56 | Resp 18

## 2014-11-01 DIAGNOSIS — Z9889 Other specified postprocedural states: Secondary | ICD-10-CM | POA: Insufficient documentation

## 2014-11-01 DIAGNOSIS — Z853 Personal history of malignant neoplasm of breast: Secondary | ICD-10-CM

## 2014-11-01 DIAGNOSIS — Z452 Encounter for adjustment and management of vascular access device: Secondary | ICD-10-CM

## 2014-11-01 DIAGNOSIS — Z95828 Presence of other vascular implants and grafts: Secondary | ICD-10-CM

## 2014-11-01 MED ORDER — HEPARIN SOD (PORK) LOCK FLUSH 100 UNIT/ML IV SOLN
500.0000 [IU] | Freq: Once | INTRAVENOUS | Status: AC
  Administered 2014-11-01: 500 [IU] via INTRAVENOUS

## 2014-11-01 MED ORDER — HEPARIN SOD (PORK) LOCK FLUSH 100 UNIT/ML IV SOLN
INTRAVENOUS | Status: AC
  Filled 2014-11-01: qty 5

## 2014-11-01 MED ORDER — SODIUM CHLORIDE 0.9 % IJ SOLN
10.0000 mL | INTRAMUSCULAR | Status: DC | PRN
  Administered 2014-11-01: 10 mL via INTRAVENOUS
  Filled 2014-11-01: qty 10

## 2014-11-01 NOTE — Progress Notes (Signed)
[name] presented for Portacath access and flush.  Proper placement of portacath confirmed by CXR.  Portacath located left chest wall accessed with  H 20 needle.  No blood return and flushed easily.  Portacath flushed with 62ml NS and 500U/7ml Heparin and needle removed intact.  Procedure tolerated well and without incident.

## 2014-11-01 NOTE — Patient Instructions (Signed)
You had a port flush today.  PLease call the clinic if you have any questions or concerns

## 2014-12-01 ENCOUNTER — Encounter (HOSPITAL_COMMUNITY): Payer: Medicare Other

## 2015-02-22 ENCOUNTER — Encounter (HOSPITAL_COMMUNITY): Payer: Self-pay

## 2015-02-22 ENCOUNTER — Encounter (HOSPITAL_COMMUNITY): Payer: Medicaid Other | Attending: Hematology & Oncology

## 2015-02-22 VITALS — BP 146/53 | HR 51 | Temp 97.5°F | Resp 18

## 2015-02-22 DIAGNOSIS — Z95828 Presence of other vascular implants and grafts: Secondary | ICD-10-CM

## 2015-02-22 DIAGNOSIS — Z853 Personal history of malignant neoplasm of breast: Secondary | ICD-10-CM

## 2015-02-22 DIAGNOSIS — Z452 Encounter for adjustment and management of vascular access device: Secondary | ICD-10-CM

## 2015-02-22 MED ORDER — HEPARIN SOD (PORK) LOCK FLUSH 100 UNIT/ML IV SOLN
500.0000 [IU] | Freq: Once | INTRAVENOUS | Status: AC
  Administered 2015-02-22: 500 [IU] via INTRAVENOUS

## 2015-02-22 MED ORDER — SODIUM CHLORIDE 0.9 % IJ SOLN
10.0000 mL | INTRAMUSCULAR | Status: DC | PRN
  Administered 2015-02-22: 10 mL via INTRAVENOUS
  Filled 2015-02-22: qty 10

## 2015-02-22 NOTE — Progress Notes (Signed)
[name] presented for Portacath access and flush.  Proper placement of portacath confirmed by CXR.  Portacath located left chest wall accessed with  H 20 needle.  Good blood return present. Portacath flushed with 30ml NS and 500U/50ml Heparin and needle removed intact.  Procedure tolerated well and without incident.

## 2015-02-22 NOTE — Patient Instructions (Signed)
[place] at [facility]  Discharge Instructions:  Had your port flushed today.  Follow up as scheduled.  Call the clinic if you have any questions or concerns _______________________________________________________________  Thank you for choosing [place] at [facility] to provide your oncology and hematology care.  To afford each patient quality time with our providers, please arrive at least 15 minutes before your scheduled appointment.  You need to re-schedule your appointment if you arrive 10 or more minutes late.  We strive to give you quality time with our providers, and arriving late affects you and other patients whose appointments are after yours.  Also, if you no show three or more times for appointments you may be dismissed from the clinic.  Again, thank you for choosing [place] [facility] [place] hope is that these requests will allow you access to exceptional care and in a timely manner. _______________________________________________________________  If you have questions after your visit, please contact our office at [phone] between the hours of 8:30 a.m. and 5:00 p.m. Voicemails left after 4:30 p.m. will not be returned until the following business day. _______________________________________________________________  For prescription refill requests, have your pharmacy contact our office. _______________________________________________________________  Recommendations made by the consultant and any test results will be sent to your referring physician. _______________________________________________________________

## 2015-02-23 ENCOUNTER — Encounter (HOSPITAL_COMMUNITY): Payer: Self-pay

## 2015-03-05 ENCOUNTER — Encounter (HOSPITAL_COMMUNITY): Payer: Self-pay | Admitting: Emergency Medicine

## 2015-03-05 ENCOUNTER — Emergency Department (HOSPITAL_COMMUNITY): Payer: Medicare HMO

## 2015-03-05 ENCOUNTER — Emergency Department (HOSPITAL_COMMUNITY)
Admission: EM | Admit: 2015-03-05 | Discharge: 2015-03-05 | Disposition: A | Discharge status: Home / Self Care | Payer: Medicare HMO | Attending: Emergency Medicine | Admitting: Emergency Medicine

## 2015-03-05 DIAGNOSIS — E669 Obesity, unspecified: Secondary | ICD-10-CM | POA: Insufficient documentation

## 2015-03-05 DIAGNOSIS — Z853 Personal history of malignant neoplasm of breast: Secondary | ICD-10-CM | POA: Insufficient documentation

## 2015-03-05 DIAGNOSIS — Z794 Long term (current) use of insulin: Secondary | ICD-10-CM | POA: Insufficient documentation

## 2015-03-05 DIAGNOSIS — E785 Hyperlipidemia, unspecified: Secondary | ICD-10-CM | POA: Insufficient documentation

## 2015-03-05 DIAGNOSIS — Z7982 Long term (current) use of aspirin: Secondary | ICD-10-CM | POA: Insufficient documentation

## 2015-03-05 DIAGNOSIS — R609 Edema, unspecified: Secondary | ICD-10-CM

## 2015-03-05 DIAGNOSIS — Z79899 Other long term (current) drug therapy: Secondary | ICD-10-CM | POA: Diagnosis not present

## 2015-03-05 DIAGNOSIS — R600 Localized edema: Secondary | ICD-10-CM | POA: Insufficient documentation

## 2015-03-05 DIAGNOSIS — N189 Chronic kidney disease, unspecified: Secondary | ICD-10-CM | POA: Insufficient documentation

## 2015-03-05 DIAGNOSIS — F329 Major depressive disorder, single episode, unspecified: Secondary | ICD-10-CM | POA: Diagnosis not present

## 2015-03-05 DIAGNOSIS — J45901 Unspecified asthma with (acute) exacerbation: Secondary | ICD-10-CM | POA: Insufficient documentation

## 2015-03-05 DIAGNOSIS — R0602 Shortness of breath: Secondary | ICD-10-CM | POA: Diagnosis present

## 2015-03-05 DIAGNOSIS — Z862 Personal history of diseases of the blood and blood-forming organs and certain disorders involving the immune mechanism: Secondary | ICD-10-CM | POA: Insufficient documentation

## 2015-03-05 DIAGNOSIS — I129 Hypertensive chronic kidney disease with stage 1 through stage 4 chronic kidney disease, or unspecified chronic kidney disease: Secondary | ICD-10-CM | POA: Diagnosis not present

## 2015-03-05 DIAGNOSIS — M1990 Unspecified osteoarthritis, unspecified site: Secondary | ICD-10-CM | POA: Insufficient documentation

## 2015-03-05 DIAGNOSIS — E11319 Type 2 diabetes mellitus with unspecified diabetic retinopathy without macular edema: Secondary | ICD-10-CM | POA: Diagnosis not present

## 2015-03-05 LAB — CBC WITH DIFFERENTIAL/PLATELET
Basophils Absolute: 0.1 10*3/uL (ref 0.0–0.1)
Basophils Relative: 1 % (ref 0–1)
Eosinophils Absolute: 0.3 10*3/uL (ref 0.0–0.7)
Eosinophils Relative: 4 % (ref 0–5)
HCT: 31.7 % — ABNORMAL LOW (ref 36.0–46.0)
Hemoglobin: 9.7 g/dL — ABNORMAL LOW (ref 12.0–15.0)
Lymphocytes Relative: 33 % (ref 12–46)
Lymphs Abs: 2.5 10*3/uL (ref 0.7–4.0)
MCH: 27.9 pg (ref 26.0–34.0)
MCHC: 30.6 g/dL (ref 30.0–36.0)
MCV: 91.1 fL (ref 78.0–100.0)
Monocytes Absolute: 0.5 10*3/uL (ref 0.1–1.0)
Monocytes Relative: 7 % (ref 3–12)
Neutro Abs: 4.2 10*3/uL (ref 1.7–7.7)
Neutrophils Relative %: 56 % (ref 43–77)
Platelets: 264 10*3/uL (ref 150–400)
RBC: 3.48 MIL/uL — ABNORMAL LOW (ref 3.87–5.11)
RDW: 13.4 % (ref 11.5–15.5)
Smear Review: ADEQUATE
WBC: 7.3 10*3/uL (ref 4.0–10.5)

## 2015-03-05 LAB — BASIC METABOLIC PANEL
Anion gap: 8 (ref 5–15)
BUN: 32 mg/dL — ABNORMAL HIGH (ref 6–23)
CHLORIDE: 108 mmol/L (ref 96–112)
CO2: 25 mmol/L (ref 19–32)
CREATININE: 1.76 mg/dL — AB (ref 0.50–1.10)
Calcium: 9 mg/dL (ref 8.4–10.5)
GFR, EST AFRICAN AMERICAN: 31 mL/min — AB (ref >90)
GFR, EST NON AFRICAN AMERICAN: 26 mL/min — AB (ref >90)
GLUCOSE: 156 mg/dL — AB (ref 70–99)
Potassium: 4.5 mmol/L (ref 3.5–5.1)
SODIUM: 141 mmol/L (ref 135–145)

## 2015-03-05 LAB — TROPONIN I
Troponin I: 0.04 ng/mL — ABNORMAL HIGH (ref <0.031)
Troponin I: 0.05 ng/mL — ABNORMAL HIGH (ref <0.031)

## 2015-03-05 MED ORDER — SODIUM CHLORIDE 0.9 % IV SOLN
Freq: Once | INTRAVENOUS | Status: AC
  Administered 2015-03-05: 16:00:00 via INTRAVENOUS

## 2015-03-05 MED ORDER — HEPARIN SOD (PORK) LOCK FLUSH 100 UNIT/ML IV SOLN
INTRAVENOUS | Status: AC
  Administered 2015-03-05: 500 [IU]
  Filled 2015-03-05: qty 5

## 2015-03-05 MED ORDER — POTASSIUM CHLORIDE CRYS ER 20 MEQ PO TBCR: 20.0000 meq | EXTENDED_RELEASE_TABLET | Freq: Every day | ORAL | Status: DC

## 2015-03-05 NOTE — ED Notes (Addendum)
Pt presents to ED complaining of shortness of breath.  She states that it "feels like fluid is coming up into her throat" and is having to cough and clear her throat a lot.  She has a history of CHF and believes her current shortness of breath is related to that diagnosis.  Her cough is productive with clear sputum.  Denies fever, dizziness, nausea, vomiting.  Patient reports sweating with no exertion.  She has to be sitting upright in order to breathe but has been sleeping fairly well at home with a single pillow.  Current O2 saturation is 99% on room air. Pt reports weight gain of approximately 20 pounds in about a month and has dependent +2 pitting edema in bilateral extremities.  Breath sounds are clear but diminished bilaterally.

## 2015-03-05 NOTE — Discharge Instructions (Signed)
Take Lasix twice a day for 1 week. Follow-up with her doctor next week for checkup. Take the prescription for potassium to prevent you from losing too much potassium.   Peripheral Edema You have swelling in your legs (peripheral edema). This swelling is due to excess accumulation of salt and water in your body. Edema may be a sign of heart, kidney or liver disease, or a side effect of a medication. It may also be due to problems in the leg veins. Elevating your legs and using special support stockings may be very helpful, if the cause of the swelling is due to poor venous circulation. Avoid long periods of standing, whatever the cause. Treatment of edema depends on identifying the cause. Chips, pretzels, pickles and other salty foods should be avoided. Restricting salt in your diet is almost always needed. Water pills (diuretics) are often used to remove the excess salt and water from your body via urine. These medicines prevent the kidney from reabsorbing sodium. This increases urine flow. Diuretic treatment may also result in lowering of potassium levels in your body. Potassium supplements may be needed if you have to use diuretics daily. Daily weights can help you keep track of your progress in clearing your edema. You should call your caregiver for follow up care as recommended. SEEK IMMEDIATE MEDICAL CARE IF:   You have increased swelling, pain, redness, or heat in your legs.  You develop shortness of breath, especially when lying down.  You develop chest or abdominal pain, weakness, or fainting.  You have a fever. Document Released: 12/27/2004 Document Revised: 02/11/2012 Document Reviewed: 12/07/2009 [facility] Patient Information 2015 [place], [place]. This information is not intended to replace advice given to you by your health care provider. Make sure you discuss any questions you have with your health care provider.

## 2015-03-05 NOTE — ED Provider Notes (Signed)
CSN: [id number]     Arrival date & time 03/05/15  1406 History   First MD Initiated Contact with Patient 03/05/15 1503     Chief Complaint  Patient presents with  . Shortness of Breath     (Consider location/radiation/quality/duration/timing/severity/associated sxs/prior Treatment) HPI   [name] is a 81 y.o. female who states that she is here for evaluation of peripheral edema that is not getting better by using her usual Lasix dosing. She contacted her PCP, yesterday by telephone and was advised to come to the emergency department. She has had cough, productive of clear sputum and feels like fluid comes up into her throat when she coughs. She has occasional shortness of breath but no orthopnea. She reports having congestive heart failure, but does not see a cardiologist regularly. She feels that she has gained about 20 pounds in the last month. She does not weigh herself regularly. Been no nausea, vomiting, diarrhea, weakness or dizziness. She states that she has a history of COPD and is an ex-smoker for many years. She is using inhalers in the past but not currently. She is taking her usual medications, without relief. There are no other known modifying factors.   Past Medical History  Diagnosis Date  . Anemia   . IBS (irritable bowel syndrome)     diverticulosis, gastroesophageal reflux disease  . Hypertension   . Asthma   . Diabetes mellitus   . Hyperlipidemia   . Arthritis   . Headaches, cluster   . Lower back pain   . Aortic stenosis     Mild  . Depression   . Fibromyalgia   . Cerebrovascular disease     with a 57% LICA; repeat study in 10/2009-no obstructive disease; modest ASVD  . Falls infrequently 01/2010    fracture of pelvis and left humerus  . Atrial flutter 2002  . DJD (degenerative joint disease) 11/14/2011  . Obesity 11/14/2011  . Diabetic retinopathy   . Decreased bone density   . Breast carcinoma   . Chronic kidney disease     Creatinine-1.5 in 2010 and  2.5-3 in 2011; 1.5-10/2010;  Klebsiella UTI-10/2010. urine protein 36 mg/dl, mildly elevated   Past Surgical History  Procedure Laterality Date  . Mastectomy      Right breast for carcinoma  . Lumbar disc surgery      lumbosacral spine procedure x 2  . Appendectomy    . Cholecystectomy    . Cataract extraction, bilateral      Lens implants  . Central venous catheter tunneled insertion single lumen    . Colonoscopy  2012  . Eye surgery  nov 2012    for diabetic retinopathy   Family History  Problem Relation Age of Onset  . Alzheimer's disease Mother   . Heart attack Father   . Aneurysm Sister   . Coronary artery disease Brother    History  Substance Use Topics  . Smoking status: Former Research scientist (life sciences)  . Smokeless tobacco: Never Used  . Alcohol Use: No   OB History    No data available     Review of Systems  All other systems reviewed and are negative.     Allergies  Imodium; Mold extract; Morphine and related; Niaspan; and Pollen extract  Home Medications   Prior to Admission medications   Medication Sig Start Date End Date Taking? Authorizing Provider  albuterol (PROAIR HFA) 108 (90 BASE) MCG/ACT inhaler Inhale 2 puffs into the lungs every 6 (six) hours as  needed for wheezing or shortness of breath. 06/16/14  Yes Ellouise Newer, PA-C  aspirin EC 81 MG tablet Take 81 mg by mouth daily.   Yes Historical Provider, MD  esomeprazole (NEXIUM) 40 MG capsule Take 40 mg by mouth every morning.    Yes Historical Provider, MD  fenofibrate (TRICOR) 145 MG tablet Take 145 mg by mouth daily.   Yes Historical Provider, MD  furosemide (LASIX) 40 MG tablet Take 40 mg by mouth daily. 09/02/14  Yes Historical Provider, MD  glipiZIDE (GLUCOTROL) 10 MG tablet Take 10 mg by mouth 2 (two) times daily.    Yes Historical Provider, MD  HYDROcodone-acetaminophen (NORCO) 10-325 MG per tablet Take 1 tablet by mouth every 4 (four) hours as needed. For pain 09/03/14  Yes Historical Provider, MD   insulin aspart protamine - aspart (NOVOLOG 70/30 MIX) (70-30) 100 UNIT/ML FlexPen Inject 10-12 Units into the skin 3 (three) times daily with meals.   Yes Historical Provider, MD  LANTUS SOLOSTAR 100 UNIT/ML Solostar Pen Inject 50 Units into the skin at bedtime.  09/02/14  Yes Historical Provider, MD  lisinopril-hydrochlorothiazide (PRINZIDE,ZESTORETIC) 20-25 MG per tablet Take 1 tablet by mouth daily.   Yes Historical Provider, MD  metoprolol (LOPRESSOR) 50 MG tablet Take 50 mg by mouth 2 (two) times daily.   Yes Historical Provider, MD  ondansetron (ZOFRAN-ODT) 8 MG disintegrating tablet Take 1 tablet (8 mg total) by mouth every 8 (eight) hours as needed for nausea or vomiting. 09/27/14  Yes Benjiman Core, MD  PARoxetine (PAXIL) 40 MG tablet Take 40 mg by mouth every morning.     Yes Historical Provider, MD  pregabalin (LYRICA) 50 MG capsule Take 50 mg by mouth 4 (four) times daily.    Yes Historical Provider, MD  raloxifene (EVISTA) 60 MG tablet Take 60 mg by mouth daily.   Yes Historical Provider, MD  rosuvastatin (CRESTOR) 40 MG tablet Take 40 mg by mouth at bedtime.   Yes Historical Provider, MD  traMADol (ULTRAM) 50 MG tablet Take 50-100 mg by mouth 4 (four) times daily -  with meals and at bedtime.    Yes Historical Provider, MD  potassium chloride SA (K-DUR,KLOR-CON) 20 MEQ tablet Take 1 tablet (20 mEq total) by mouth daily. 03/05/15   Mancel Bale, MD   BP 195/82 mmHg  Pulse 78  Temp(Src) 98.4 F (36.9 C) (Oral)  Resp 20  Ht 5\' 11"  (1.803 m)  Wt 235 lb (106.595 kg)  BMI 32.79 kg/m2  SpO2 97% Physical Exam  Constitutional: She is oriented to person, place, and time. She appears well-developed and well-nourished.  HENT:  Head: Normocephalic and atraumatic.  Right Ear: External ear normal.  Left Ear: External ear normal.  Eyes: Conjunctivae and EOM are normal. Pupils are equal, round, and reactive to light.  Neck: Normal range of motion and phonation normal. Neck supple.   Cardiovascular: Normal rate, regular rhythm and normal heart sounds.   There is no JVD  Pulmonary/Chest: Effort normal and breath sounds normal. No respiratory distress. She has no wheezes. She has no rales. She exhibits no tenderness and no bony tenderness.  Abdominal: Soft. There is no tenderness.  Musculoskeletal: Normal range of motion. She exhibits edema (1+ bilaterally). She exhibits no tenderness.  Neurological: She is alert and oriented to person, place, and time. No cranial nerve deficit or sensory deficit. She exhibits normal muscle tone. Coordination normal.  Skin: Skin is warm, dry and intact.  Psychiatric: She has a normal mood and  affect. Her behavior is normal. Judgment and thought content normal.  Nursing note and vitals reviewed.   ED Course  Procedures (including critical care time)  Record review indicates last cardiac echo 2011, with ejection fraction 60-65%. No other abnormalities.  Medications  0.9 %  sodium chloride infusion ( Intravenous New Bag/Given 03/05/15 1530)    Patient Vitals for the past 24 hrs:  BP Temp Temp src Pulse Resp SpO2 Height Weight  03/05/15 1927 195/82 mmHg - - 78 20 97 % - -  03/05/15 1847 191/66 mmHg - - 72 - 95 % - -  03/05/15 1530 170/69 mmHg - - 63 17 99 % - -  03/05/15 1500 168/68 mmHg - - 62 14 90 % - -  03/05/15 1427 - - - - - 98 % - -  03/05/15 1421 - - - - - 99 % - -  03/05/15 1417 162/60 mmHg 98.4 F (36.9 C) Oral (!) 59 20 99 % $Re'5\' 11"'QXO$  (1.803 m) 235 lb (106.595 kg)    8:44 PM Reevaluation with update and discussion. After initial assessment and treatment, an updated evaluation reveals no additional complaints. Findings discussed with patient, all questions answered.Daleen Bo L     Labs Review Labs Reviewed  CBC WITH DIFFERENTIAL/PLATELET - Abnormal; Notable for the following:    RBC 3.48 (*)    Hemoglobin 9.7 (*)    HCT 31.7 (*)    All other components within normal limits  TROPONIN I - Abnormal; Notable for the  following:    Troponin I 0.04 (*)    All other components within normal limits  BASIC METABOLIC PANEL - Abnormal; Notable for the following:    Glucose, Bld 156 (*)    BUN 32 (*)    Creatinine, Ser 1.76 (*)    GFR calc non Af Amer 26 (*)    GFR calc Af Amer 31 (*)    All other components within normal limits  TROPONIN I - Abnormal; Notable for the following:    Troponin I 0.05 (*)    All other components within normal limits    Imaging Review Dg Chest 2 View  03/05/2015   CLINICAL DATA:  Short of breath. Swelling of ankles and feet. History of inflammatory breast cancer.  EXAM: CHEST  2 VIEW  COMPARISON:  05/21/2014  FINDINGS: Lumbar spine fixation. Hyperinflation. A left-sided subclavian line which terminates at the low SVC. Right mastectomy and axillary node dissection. Midline trachea. Patient minimally rotated left. Moderate cardiomegaly. No pleural effusion or pneumothorax. No congestive failure. No lobar consolidation.  IMPRESSION: Cardiomegaly, without congestive failure or acute disease.   Electronically Signed   By: Abigail Miyamoto M.D.   On: 03/05/2015 16:39     EKG Interpretation   Date/Time:  Saturday March 05 2015 14:51:42 EDT Ventricular Rate:  65 PR Interval:  173 QRS Duration: 94 QT Interval:  410 QTC Calculation: 426 R Axis:   47 Text Interpretation:  Sinus rhythm since last tracing no significant  change Confirmed by Deklin Bieler  MD, Melayna Robarts ([id number]) on 03/05/2015 3:06:37 PM      MDM   Final diagnoses:  Peripheral edema    Nonspecific peripheral edema insert just heart failure, ACS, PE or pneumonia.  Nursing Notes Reviewed/ Care Coordinated Applicable Imaging Reviewed Interpretation of Laboratory Data incorporated into ED treatment  The patient appears reasonably screened and/or stabilized for discharge and I doubt any other medical condition or other [facility] requiring further screening, evaluation, or treatment in the ED  at this time prior to discharge.  Plan: Home  Medications- double lasix for 1 weel; Home Treatments- rest, elevate; return here if the recommended treatment, does not improve the symptoms; Recommended follow up- PCP 1 week   Daleen Bo, MD 03/05/15 2045

## 2015-03-05 NOTE — ED Notes (Signed)
Port accessed in left chest using sterile technique. Unable to obtain blood from port. Port flushed with sterile normal saline without difficulty. Normal saline infusing at 50 ml/hr per protocol for maintenance of access patency.

## 2015-03-21 ENCOUNTER — Other Ambulatory Visit (HOSPITAL_COMMUNITY): Payer: Self-pay | Admitting: Oncology

## 2015-03-21 ENCOUNTER — Other Ambulatory Visit: Payer: Self-pay | Admitting: Cardiovascular Disease

## 2015-03-21 MED ORDER — METOPROLOL TARTRATE 50 MG PO TABS: 50.0000 mg | ORAL_TABLET | Freq: Two times a day (BID) | ORAL | Status: DC

## 2015-03-21 NOTE — Telephone Encounter (Signed)
Refill complete 

## 2015-03-21 NOTE — Telephone Encounter (Signed)
Received fax refill request  Rx # [id number] Medication:  Metoprolol $RemoveBef'50mg'fJmYFjBuUO$  Tablet Qty 60 Sig:  Take 1 tablet by mouth twice daily for blood pressure Physician:  Kate Sable

## 2015-04-18 ENCOUNTER — Other Ambulatory Visit: Payer: Self-pay | Admitting: Cardiovascular Disease

## 2015-04-19 ENCOUNTER — Encounter (HOSPITAL_COMMUNITY): Payer: Medicaid Other

## 2015-04-26 ENCOUNTER — Encounter (HOSPITAL_COMMUNITY): Payer: Medicaid Other

## 2015-05-18 ENCOUNTER — Telehealth: Payer: Self-pay

## 2015-05-18 MED ORDER — LISINOPRIL-HYDROCHLOROTHIAZIDE 20-25 MG PO TABS: 1.0000 | ORAL_TABLET | Freq: Every day | ORAL | Status: DC

## 2015-05-18 NOTE — Telephone Encounter (Signed)
Refill complete 

## 2015-06-16 ENCOUNTER — Encounter (HOSPITAL_COMMUNITY): Payer: Medicare HMO | Attending: Oncology | Admitting: Oncology

## 2015-06-16 ENCOUNTER — Encounter (HOSPITAL_COMMUNITY): Payer: Medicaid Other

## 2015-06-16 ENCOUNTER — Encounter (HOSPITAL_COMMUNITY): Payer: Medicare HMO

## 2015-06-16 ENCOUNTER — Ambulatory Visit (HOSPITAL_COMMUNITY): Payer: Medicare Other | Admitting: Oncology

## 2015-06-16 VITALS — BP 149/60 | HR 51 | Resp 18 | Wt 237.8 lb

## 2015-06-16 DIAGNOSIS — C50911 Malignant neoplasm of unspecified site of right female breast: Secondary | ICD-10-CM | POA: Diagnosis present

## 2015-06-16 DIAGNOSIS — N63 Unspecified lump in breast: Secondary | ICD-10-CM | POA: Diagnosis not present

## 2015-06-16 DIAGNOSIS — D649 Anemia, unspecified: Secondary | ICD-10-CM | POA: Insufficient documentation

## 2015-06-16 LAB — COMPREHENSIVE METABOLIC PANEL
ALBUMIN: 3.6 g/dL (ref 3.5–5.0)
ALK PHOS: 26 U/L — AB (ref 38–126)
ALT: 14 U/L (ref 14–54)
AST: 18 U/L (ref 15–41)
Anion gap: 9 (ref 5–15)
BUN: 37 mg/dL — ABNORMAL HIGH (ref 6–20)
CO2: 25 mmol/L (ref 22–32)
Calcium: 8.6 mg/dL — ABNORMAL LOW (ref 8.9–10.3)
Chloride: 103 mmol/L (ref 101–111)
Creatinine, Ser: 1.79 mg/dL — ABNORMAL HIGH (ref 0.44–1.00)
GFR, EST AFRICAN AMERICAN: 30 mL/min — AB (ref >60)
GFR, EST NON AFRICAN AMERICAN: 26 mL/min — AB (ref >60)
Glucose, Bld: 368 mg/dL — ABNORMAL HIGH (ref 65–99)
Potassium: 4.7 mmol/L (ref 3.5–5.1)
Sodium: 137 mmol/L (ref 135–145)
TOTAL PROTEIN: 6.4 g/dL — AB (ref 6.5–8.1)
Total Bilirubin: 0.4 mg/dL (ref 0.3–1.2)

## 2015-06-16 LAB — CBC WITH DIFFERENTIAL/PLATELET
BASOS ABS: 0 10*3/uL (ref 0.0–0.1)
BASOS PCT: 0 % (ref 0–1)
EOS ABS: 0 10*3/uL (ref 0.0–0.7)
EOS PCT: 0 % (ref 0–5)
HEMATOCRIT: 36.6 % (ref 36.0–46.0)
HEMOGLOBIN: 11.4 g/dL — AB (ref 12.0–15.0)
LYMPHS ABS: 1.5 10*3/uL (ref 0.7–4.0)
LYMPHS PCT: 20 % (ref 12–46)
MCH: 26.7 pg (ref 26.0–34.0)
MCHC: 31.1 g/dL (ref 30.0–36.0)
MCV: 85.7 fL (ref 78.0–100.0)
MONO ABS: 0.3 10*3/uL (ref 0.1–1.0)
Monocytes Relative: 4 % (ref 3–12)
NEUTROS ABS: 5.8 10*3/uL (ref 1.7–7.7)
Neutrophils Relative %: 76 % (ref 43–77)
Platelets: 228 10*3/uL (ref 150–400)
RBC: 4.27 MIL/uL (ref 3.87–5.11)
RDW: 14.4 % (ref 11.5–15.5)
WBC: 7.7 10*3/uL (ref 4.0–10.5)

## 2015-06-16 LAB — IRON AND TIBC
Iron: 57 ug/dL (ref 28–170)
SATURATION RATIOS: 12 % (ref 10.4–31.8)
TIBC: 496 ug/dL — AB (ref 250–450)
UIBC: 439 ug/dL

## 2015-06-16 LAB — FOLATE: Folate: 14.7 ng/mL (ref >5.9)

## 2015-06-16 LAB — FERRITIN: Ferritin: 75 ng/mL (ref 11–307)

## 2015-06-16 LAB — VITAMIN B12: Vitamin B-12: 107 pg/mL — ABNORMAL LOW (ref 180–914)

## 2015-06-16 MED ORDER — HEPARIN SOD (PORK) LOCK FLUSH 100 UNIT/ML IV SOLN
500.0000 [IU] | Freq: Once | INTRAVENOUS | Status: AC
  Administered 2015-06-16: 500 [IU] via INTRAVENOUS
  Filled 2015-06-16: qty 5

## 2015-06-16 MED ORDER — SODIUM CHLORIDE 0.9 % IJ SOLN
10.0000 mL | INTRAMUSCULAR | Status: DC | PRN
  Administered 2015-06-16: 10 mL via INTRAVENOUS
  Filled 2015-06-16: qty 10

## 2015-06-16 NOTE — Patient Instructions (Signed)
[place] at [facility] Discharge Instructions  RECOMMENDATIONS MADE BY THE CONSULTANT AND ANY TEST RESULTS WILL BE SENT TO YOUR REFERRING PHYSICIAN.  Exam and discussion by Robynn Pane, PA-C Will get mammogram and ultrasound of area on right side Labs today Report any new lumps, bone pain, shortness of breath or other symptoms.  Port flushes every 8 weeks and office visit in 1 year.  Thank you for choosing [place] at [facility] to provide your oncology and hematology care.  To afford each patient quality time with our provider, please arrive at least 15 minutes before your scheduled appointment time.    You need to re-schedule your appointment should you arrive 10 or more minutes late.  We strive to give you quality time with our providers, and arriving late affects you and other patients whose appointments are after yours.  Also, if you no show three or more times for appointments you may be dismissed from the clinic at the providers discretion.     Again, thank you for choosing [facility].  Our hope is that these requests will decrease the amount of time that you wait before being seen by our physicians.       _____________________________________________________________  Should you have questions after your visit to [facility], please contact our office at [phone] between the hours of 8:30 a.m. and 4:30 p.m.  Voicemails left after 4:30 p.m. will not be returned until the following business day.  For prescription refill requests, have your pharmacy contact our office.

## 2015-06-16 NOTE — Progress Notes (Signed)
Please see doctors encounter for more information 

## 2015-06-16 NOTE — Assessment & Plan Note (Addendum)
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 This was an ER, PR, negative cancer but HER-2/neu 3+ positive. She was treated with FEC x4 cycles followed by surgery. We then treated with Taxotere navelbine along with Herceptin the latter for 52 weeks. She was able to tolerate only 2 cycles of Taxotere navelbine of a planned 4 cycles. She declined radiation therapy however but finished all of her Herceptin.  NED  Due to patient reported right axillary nodule (not noted on exam but exam hindered due to patient positioning in wheelchair), order placed for right breast and axillary [place] with mammogram on left.  Return in 12 months for follow-up.  Anemia noted.  Will perform labs today: CBC diff, CMET, and anemia panel.  Will check for vitamin deficiencies.    I have deferred pain management to primary care provider.
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
## 2015-06-20 ENCOUNTER — Other Ambulatory Visit (HOSPITAL_COMMUNITY): Payer: Self-pay | Admitting: Oncology

## 2015-06-20 ENCOUNTER — Encounter (HOSPITAL_COMMUNITY): Payer: Self-pay | Admitting: Oncology

## 2015-06-20 DIAGNOSIS — E611 Iron deficiency: Secondary | ICD-10-CM

## 2015-06-20 DIAGNOSIS — E538 Deficiency of other specified B group vitamins: Secondary | ICD-10-CM

## 2015-06-20 HISTORY — DX: Deficiency of other specified B group vitamins: E53.8

## 2015-06-27 ENCOUNTER — Ambulatory Visit (HOSPITAL_COMMUNITY): Payer: Medicare HMO

## 2015-06-27 ENCOUNTER — Other Ambulatory Visit (HOSPITAL_COMMUNITY): Payer: Medicare HMO

## 2015-06-28 ENCOUNTER — Telehealth (HOSPITAL_COMMUNITY): Payer: Self-pay | Admitting: Hematology & Oncology

## 2015-06-28 NOTE — Telephone Encounter (Signed)
PRE CERT NOT REQUIRED FOR [id number] [name] REF# [id number]  [facility] [phone]

## 2015-06-30 ENCOUNTER — Encounter (HOSPITAL_COMMUNITY): Payer: Medicare HMO

## 2015-06-30 ENCOUNTER — Encounter (HOSPITAL_BASED_OUTPATIENT_CLINIC_OR_DEPARTMENT_OTHER): Payer: Medicare HMO

## 2015-06-30 ENCOUNTER — Ambulatory Visit (HOSPITAL_COMMUNITY)
Admission: RE | Admit: 2015-06-30 | Discharge: 2015-06-30 | Disposition: A | Discharge status: Home / Self Care | Payer: Medicare HMO | Source: Ambulatory Visit | Attending: Oncology | Admitting: Oncology

## 2015-06-30 DIAGNOSIS — Z853 Personal history of malignant neoplasm of breast: Secondary | ICD-10-CM | POA: Diagnosis not present

## 2015-06-30 DIAGNOSIS — Z1231 Encounter for screening mammogram for malignant neoplasm of breast: Secondary | ICD-10-CM | POA: Insufficient documentation

## 2015-06-30 DIAGNOSIS — C50911 Malignant neoplasm of unspecified site of right female breast: Secondary | ICD-10-CM

## 2015-06-30 DIAGNOSIS — Z9011 Acquired absence of right breast and nipple: Secondary | ICD-10-CM | POA: Diagnosis not present

## 2015-06-30 DIAGNOSIS — E538 Deficiency of other specified B group vitamins: Secondary | ICD-10-CM

## 2015-06-30 NOTE — Progress Notes (Signed)
Labs drawn

## 2015-07-01 LAB — ANTI-PARIETAL ANTIBODY: Parietal Cell Antibody-IgG: 2.4 Units (ref 0.0–20.0)

## 2015-07-01 LAB — INTRINSIC FACTOR ANTIBODIES: INTRINSIC FACTOR: 1 [AU]/ml (ref 0.0–1.1)

## 2015-07-04 ENCOUNTER — Encounter (HOSPITAL_COMMUNITY): Payer: Medicare HMO | Attending: Oncology

## 2015-07-04 ENCOUNTER — Encounter (HOSPITAL_BASED_OUTPATIENT_CLINIC_OR_DEPARTMENT_OTHER): Payer: Medicare HMO

## 2015-07-04 VITALS — BP 160/59 | HR 59 | Temp 98.4°F | Resp 18

## 2015-07-04 DIAGNOSIS — E611 Iron deficiency: Secondary | ICD-10-CM

## 2015-07-04 DIAGNOSIS — E538 Deficiency of other specified B group vitamins: Secondary | ICD-10-CM

## 2015-07-04 DIAGNOSIS — C50911 Malignant neoplasm of unspecified site of right female breast: Secondary | ICD-10-CM | POA: Insufficient documentation

## 2015-07-04 DIAGNOSIS — D649 Anemia, unspecified: Secondary | ICD-10-CM | POA: Insufficient documentation

## 2015-07-04 MED ORDER — CYANOCOBALAMIN 1000 MCG/ML IJ SOLN
1000.0000 ug | Freq: Once | INTRAMUSCULAR | Status: AC
  Administered 2015-07-04: 1000 ug via INTRAMUSCULAR

## 2015-07-04 MED ORDER — HEPARIN SOD (PORK) LOCK FLUSH 100 UNIT/ML IV SOLN
500.0000 [IU] | Freq: Once | INTRAVENOUS | Status: AC
  Administered 2015-07-04: 500 [IU] via INTRAVENOUS

## 2015-07-04 MED ORDER — SODIUM CHLORIDE 0.9 % IV SOLN
INTRAVENOUS | Status: DC
  Administered 2015-07-04: 15:00:00 via INTRAVENOUS

## 2015-07-04 MED ORDER — SODIUM CHLORIDE 0.9 % IV SOLN
125.0000 mg | Freq: Once | INTRAVENOUS | Status: AC
  Administered 2015-07-04: 125 mg via INTRAVENOUS
  Filled 2015-07-04: qty 10

## 2015-07-04 MED ORDER — CYANOCOBALAMIN 1000 MCG/ML IJ SOLN
INTRAMUSCULAR | Status: AC
  Filled 2015-07-04: qty 1

## 2015-07-04 MED ORDER — HEPARIN SOD (PORK) LOCK FLUSH 100 UNIT/ML IV SOLN
INTRAVENOUS | Status: AC
  Filled 2015-07-04: qty 5

## 2015-07-04 NOTE — Progress Notes (Signed)
Patient tolerated infusion well.  VSS 30 minutes post infusion.   

## 2015-07-04 NOTE — Progress Notes (Signed)
[name] presents today for injection per MD orders. B12 1060mcg administered IM in left Upper Arm. Administration without incident. Patient tolerated well.

## 2015-07-11 ENCOUNTER — Encounter (HOSPITAL_BASED_OUTPATIENT_CLINIC_OR_DEPARTMENT_OTHER): Payer: Medicare HMO

## 2015-07-11 ENCOUNTER — Encounter (HOSPITAL_COMMUNITY): Payer: Self-pay

## 2015-07-11 VITALS — BP 143/77 | HR 54 | Temp 98.7°F | Resp 16

## 2015-07-11 DIAGNOSIS — E538 Deficiency of other specified B group vitamins: Secondary | ICD-10-CM | POA: Diagnosis not present

## 2015-07-11 MED ORDER — CYANOCOBALAMIN 1000 MCG/ML IJ SOLN
1000.0000 ug | Freq: Once | INTRAMUSCULAR | Status: AC
Start: 2015-07-11 — End: 2015-07-11
  Administered 2015-07-11: 1000 ug via INTRAMUSCULAR
  Filled 2015-07-11: qty 1

## 2015-07-11 NOTE — Patient Instructions (Signed)
[place] at [facility] Discharge Instructions  RECOMMENDATIONS MADE BY THE CONSULTANT AND ANY TEST RESULTS WILL BE SENT TO YOUR REFERRING PHYSICIAN.  B12 injection today. Return as scheduled for lab work and injections.   Thank you for choosing [place] at [facility] to provide your oncology and hematology care.  To afford each patient quality time with our provider, please arrive at least 15 minutes before your scheduled appointment time.    You need to re-schedule your appointment should you arrive 10 or more minutes late.  We strive to give you quality time with our providers, and arriving late affects you and other patients whose appointments are after yours.  Also, if you no show three or more times for appointments you may be dismissed from the clinic at the providers discretion.     Again, thank you for choosing [facility].  Our hope is that these requests will decrease the amount of time that you wait before being seen by our physicians.       _____________________________________________________________  Should you have questions after your visit to [facility], please contact our office at [phone] between the hours of 8:30 a.m. and 4:30 p.m.  Voicemails left after 4:30 p.m. will not be returned until the following business day.  For prescription refill requests, have your pharmacy contact our office.

## 2015-07-11 NOTE — Progress Notes (Signed)
[name] presents today for injection per the provider's orders.  B12 administration without incident; see MAR for injection details.  Patient tolerated procedure well and without incident.  No questions or complaints noted at this time.

## 2015-07-18 ENCOUNTER — Encounter (HOSPITAL_COMMUNITY): Payer: Self-pay

## 2015-07-18 ENCOUNTER — Encounter (HOSPITAL_BASED_OUTPATIENT_CLINIC_OR_DEPARTMENT_OTHER): Payer: Medicare HMO

## 2015-07-18 VITALS — BP 166/48 | HR 64 | Temp 97.6°F | Resp 18

## 2015-07-18 DIAGNOSIS — E538 Deficiency of other specified B group vitamins: Secondary | ICD-10-CM | POA: Diagnosis not present

## 2015-07-18 MED ORDER — CYANOCOBALAMIN 1000 MCG/ML IJ SOLN
1000.0000 ug | Freq: Once | INTRAMUSCULAR | Status: AC
  Administered 2015-07-18: 1000 ug via INTRAMUSCULAR

## 2015-07-18 MED ORDER — CYANOCOBALAMIN 1000 MCG/ML IJ SOLN
INTRAMUSCULAR | Status: AC
Start: 2015-07-18 — End: 2015-07-18
  Filled 2015-07-18: qty 1

## 2015-07-18 NOTE — Patient Instructions (Signed)
[place] at [facility] Discharge Instructions  RECOMMENDATIONS MADE BY THE CONSULTANT AND ANY TEST RESULTS WILL BE SENT TO YOUR REFERRING PHYSICIAN.  B12 injection today. Return as scheduled for injections. Return as scheduled for lab work and office visit.  Thank you for choosing [place] at [facility] to provide your oncology and hematology care.  To afford each patient quality time with our provider, please arrive at least 15 minutes before your scheduled appointment time.    You need to re-schedule your appointment should you arrive 10 or more minutes late.  We strive to give you quality time with our providers, and arriving late affects you and other patients whose appointments are after yours.  Also, if you no show three or more times for appointments you may be dismissed from the clinic at the providers discretion.     Again, thank you for choosing [facility].  Our hope is that these requests will decrease the amount of time that you wait before being seen by our physicians.       _____________________________________________________________  Should you have questions after your visit to [facility], please contact our office at [phone] between the hours of 8:30 a.m. and 4:30 p.m.  Voicemails left after 4:30 p.m. will not be returned until the following business day.  For prescription refill requests, have your pharmacy contact our office.

## 2015-07-18 NOTE — Progress Notes (Signed)
[name] presents today for injection per the provider's orders.  B12 administration without incident; see MAR for injection details.  Patient tolerated procedure well and without incident.  No questions or complaints noted at this time.

## 2015-07-20 ENCOUNTER — Other Ambulatory Visit: Payer: Self-pay

## 2015-07-20 MED ORDER — ROSUVASTATIN CALCIUM 40 MG PO TABS: 40.0000 mg | ORAL_TABLET | Freq: Every day | ORAL | Status: DC

## 2015-07-20 NOTE — Telephone Encounter (Signed)
Pt not seen in 2 yrs, needs apt for med refills

## 2015-07-20 NOTE — Telephone Encounter (Signed)
Refill complete 

## 2015-07-25 ENCOUNTER — Ambulatory Visit (HOSPITAL_COMMUNITY): Payer: Medicare HMO

## 2015-07-26 ENCOUNTER — Ambulatory Visit (HOSPITAL_COMMUNITY): Payer: Medicare HMO

## 2015-07-28 ENCOUNTER — Encounter (HOSPITAL_COMMUNITY): Payer: Self-pay

## 2015-07-28 ENCOUNTER — Encounter (HOSPITAL_BASED_OUTPATIENT_CLINIC_OR_DEPARTMENT_OTHER): Payer: Medicare HMO

## 2015-07-28 VITALS — BP 154/60 | HR 52 | Temp 98.0°F | Resp 18

## 2015-07-28 DIAGNOSIS — E538 Deficiency of other specified B group vitamins: Secondary | ICD-10-CM | POA: Diagnosis not present

## 2015-07-28 MED ORDER — CYANOCOBALAMIN 1000 MCG/ML IJ SOLN
1000.0000 ug | Freq: Once | INTRAMUSCULAR | Status: AC
  Administered 2015-07-28: 1000 ug via INTRAMUSCULAR

## 2015-07-28 MED ORDER — CYANOCOBALAMIN 1000 MCG/ML IJ SOLN
INTRAMUSCULAR | Status: AC
  Filled 2015-07-28: qty 1

## 2015-07-28 NOTE — Patient Instructions (Signed)
[place] at [facility] Discharge Instructions  RECOMMENDATIONS MADE BY THE CONSULTANT AND ANY TEST RESULTS WILL BE SENT TO YOUR REFERRING PHYSICIAN.  b12 injection today Follow up as scheduled Please call the clinic if you have any questions or concerns  Thank you for choosing [place] at [facility] to provide your oncology and hematology care.  To afford each patient quality time with our provider, please arrive at least 15 minutes before your scheduled appointment time.    You need to re-schedule your appointment should you arrive 10 or more minutes late.  We strive to give you quality time with our providers, and arriving late affects you and other patients whose appointments are after yours.  Also, if you no show three or more times for appointments you may be dismissed from the clinic at the providers discretion.     Again, thank you for choosing [facility].  Our hope is that these requests will decrease the amount of time that you wait before being seen by our physicians.       _____________________________________________________________  Should you have questions after your visit to [facility], please contact our office at [phone] between the hours of 8:30 a.m. and 4:30 p.m.  Voicemails left after 4:30 p.m. will not be returned until the following business day.  For prescription refill requests, have your pharmacy contact our office.

## 2015-07-28 NOTE — Progress Notes (Signed)
[name]'s reason for visit today is for an injection  as scheduled per MD orders.    [name] also received vitamin b12 per MD orders; see [facility] for administration details.  [name] tolerated all procedures well and without incident; questions were answered and patient was discharged.

## 2015-08-11 ENCOUNTER — Encounter (HOSPITAL_COMMUNITY): Payer: Medicare HMO

## 2015-08-12 ENCOUNTER — Other Ambulatory Visit: Payer: Self-pay | Admitting: Cardiovascular Disease

## 2015-08-15 ENCOUNTER — Other Ambulatory Visit: Payer: Self-pay | Admitting: Cardiovascular Disease

## 2015-08-18 ENCOUNTER — Ambulatory Visit (HOSPITAL_COMMUNITY): Payer: Medicare HMO

## 2015-08-18 ENCOUNTER — Encounter (HOSPITAL_COMMUNITY): Payer: Medicare HMO

## 2015-08-25 ENCOUNTER — Encounter (HOSPITAL_BASED_OUTPATIENT_CLINIC_OR_DEPARTMENT_OTHER): Payer: Medicare HMO

## 2015-08-25 ENCOUNTER — Encounter (HOSPITAL_COMMUNITY): Payer: Medicare HMO | Attending: Oncology

## 2015-08-25 DIAGNOSIS — E538 Deficiency of other specified B group vitamins: Secondary | ICD-10-CM | POA: Diagnosis not present

## 2015-08-25 DIAGNOSIS — Z95828 Presence of other vascular implants and grafts: Secondary | ICD-10-CM

## 2015-08-25 DIAGNOSIS — D649 Anemia, unspecified: Secondary | ICD-10-CM | POA: Insufficient documentation

## 2015-08-25 DIAGNOSIS — C50911 Malignant neoplasm of unspecified site of right female breast: Secondary | ICD-10-CM | POA: Insufficient documentation

## 2015-08-25 MED ORDER — SODIUM CHLORIDE 0.9 % IJ SOLN
10.0000 mL | Freq: Once | INTRAMUSCULAR | Status: AC
  Administered 2015-08-25: 10 mL via INTRAVENOUS

## 2015-08-25 MED ORDER — CYANOCOBALAMIN 1000 MCG/ML IJ SOLN
1000.0000 ug | Freq: Once | INTRAMUSCULAR | Status: AC
  Administered 2015-08-25: 1000 ug via INTRAMUSCULAR

## 2015-08-25 MED ORDER — HEPARIN SOD (PORK) LOCK FLUSH 100 UNIT/ML IV SOLN
INTRAVENOUS | Status: AC
  Filled 2015-08-25: qty 5

## 2015-08-25 MED ORDER — HEPARIN SOD (PORK) LOCK FLUSH 100 UNIT/ML IV SOLN
500.0000 [IU] | Freq: Once | INTRAVENOUS | Status: AC
  Administered 2015-08-25: 500 [IU] via INTRAVENOUS

## 2015-08-25 MED ORDER — CYANOCOBALAMIN 1000 MCG/ML IJ SOLN
INTRAMUSCULAR | Status: AC
  Filled 2015-08-25: qty 1

## 2015-08-25 NOTE — Progress Notes (Signed)
[name] presented for Portacath access and flush. Proper placement of portacath confirmed by CXR. Portacath located left chest wall accessed with  H 20 needle. No blood return present. Portacath flushed with 44ml NS and 500U/14ml Heparin and needle removed intact. Procedure without incident. Patient tolerated procedure well.  [name] presents today for injection per MD orders. B12 1000 mcg administered IM in left Upper Arm. Administration without incident. Patient tolerated well.

## 2015-08-25 NOTE — Patient Instructions (Signed)
[place] at [facility] Discharge Instructions  RECOMMENDATIONS MADE BY THE CONSULTANT AND ANY TEST RESULTS WILL BE SENT TO YOUR REFERRING PHYSICIAN.  Port flush done today as scheduled. Vitamin B12 1000 mcg injection given as ordered. Return as scheduled.  Thank you for choosing [place] at [facility] to provide your oncology and hematology care.  To afford each patient quality time with our provider, please arrive at least 15 minutes before your scheduled appointment time.    You need to re-schedule your appointment should you arrive 10 or more minutes late.  We strive to give you quality time with our providers, and arriving late affects you and other patients whose appointments are after yours.  Also, if you no show three or more times for appointments you may be dismissed from the clinic at the providers discretion.     Again, thank you for choosing [facility].  Our hope is that these requests will decrease the amount of time that you wait before being seen by our physicians.       _____________________________________________________________  Should you have questions after your visit to [facility], please contact our office at [phone] between the hours of 8:30 a.m. and 4:30 p.m.  Voicemails left after 4:30 p.m. will not be returned until the following business day.  For prescription refill requests, have your pharmacy contact our office.

## 2015-09-06 DIAGNOSIS — R69 Illness, unspecified: Secondary | ICD-10-CM | POA: Diagnosis not present

## 2015-09-12 ENCOUNTER — Other Ambulatory Visit: Payer: Self-pay | Admitting: "Endocrinology

## 2015-09-14 ENCOUNTER — Other Ambulatory Visit: Payer: Self-pay | Admitting: "Endocrinology

## 2015-09-22 ENCOUNTER — Encounter (HOSPITAL_COMMUNITY): Payer: Self-pay

## 2015-09-22 ENCOUNTER — Encounter (HOSPITAL_COMMUNITY): Payer: Medicare HMO

## 2015-09-30 DIAGNOSIS — Z23 Encounter for immunization: Secondary | ICD-10-CM | POA: Diagnosis not present

## 2015-09-30 DIAGNOSIS — R413 Other amnesia: Secondary | ICD-10-CM | POA: Diagnosis not present

## 2015-09-30 DIAGNOSIS — I10 Essential (primary) hypertension: Secondary | ICD-10-CM | POA: Diagnosis not present

## 2015-09-30 DIAGNOSIS — Z1389 Encounter for screening for other disorder: Secondary | ICD-10-CM | POA: Diagnosis not present

## 2015-09-30 DIAGNOSIS — E1140 Type 2 diabetes mellitus with diabetic neuropathy, unspecified: Secondary | ICD-10-CM | POA: Diagnosis not present

## 2015-09-30 DIAGNOSIS — R69 Illness, unspecified: Secondary | ICD-10-CM | POA: Diagnosis not present

## 2015-10-06 ENCOUNTER — Encounter (HOSPITAL_COMMUNITY): Payer: Medicare HMO

## 2015-10-14 ENCOUNTER — Other Ambulatory Visit: Payer: Self-pay | Admitting: "Endocrinology

## 2015-10-20 ENCOUNTER — Encounter (HOSPITAL_COMMUNITY): Payer: Medicare HMO | Attending: Oncology

## 2015-10-20 ENCOUNTER — Encounter (HOSPITAL_COMMUNITY): Payer: Self-pay

## 2015-10-20 ENCOUNTER — Encounter (HOSPITAL_BASED_OUTPATIENT_CLINIC_OR_DEPARTMENT_OTHER): Payer: Medicare HMO

## 2015-10-20 ENCOUNTER — Encounter (HOSPITAL_COMMUNITY): Payer: Medicare HMO

## 2015-10-20 DIAGNOSIS — E538 Deficiency of other specified B group vitamins: Secondary | ICD-10-CM

## 2015-10-20 DIAGNOSIS — C50911 Malignant neoplasm of unspecified site of right female breast: Secondary | ICD-10-CM | POA: Insufficient documentation

## 2015-10-20 DIAGNOSIS — D649 Anemia, unspecified: Secondary | ICD-10-CM | POA: Insufficient documentation

## 2015-10-20 DIAGNOSIS — Z95828 Presence of other vascular implants and grafts: Secondary | ICD-10-CM

## 2015-10-20 MED ORDER — HEPARIN SOD (PORK) LOCK FLUSH 100 UNIT/ML IV SOLN
500.0000 [IU] | Freq: Once | INTRAVENOUS | Status: AC
  Administered 2015-10-20: 500 [IU] via INTRAVENOUS

## 2015-10-20 MED ORDER — HEPARIN SOD (PORK) LOCK FLUSH 100 UNIT/ML IV SOLN
INTRAVENOUS | Status: AC
  Filled 2015-10-20: qty 5

## 2015-10-20 MED ORDER — CYANOCOBALAMIN 1000 MCG/ML IJ SOLN
INTRAMUSCULAR | Status: AC
  Filled 2015-10-20: qty 1

## 2015-10-20 MED ORDER — SODIUM CHLORIDE 0.9 % IJ SOLN
10.0000 mL | INTRAMUSCULAR | Status: AC | PRN
  Administered 2015-10-20: 10 mL via INTRAVENOUS
  Filled 2015-10-20: qty 10

## 2015-10-20 MED ORDER — CYANOCOBALAMIN 1000 MCG/ML IJ SOLN
1000.0000 ug | Freq: Once | INTRAMUSCULAR | Status: AC
  Administered 2015-10-20: 1000 ug via INTRAMUSCULAR

## 2015-10-20 NOTE — Patient Instructions (Signed)
Port flush Vitamin b12  Follow up as sch Please call the clinic if you have any questions or concerns

## 2015-10-20 NOTE — Progress Notes (Signed)
[name]'s reason for visit today is for an injection  as scheduled per MD orders.  [name] also received vitamin b12 per MD orders; see [facility] for administration details.  CACHET MCCUTCHEN tolerated all procedures well and without incident; questions were answered and patient was discharged.   [name] presented for Portacath access and flush.  Proper placement of portacath confirmed by CXR.  Portacath located left chest wall accessed with  H 20 needle.  No blood return and flushed easily.  Portacath flushed with 68ml NS and 500U/22ml Heparin and needle removed intact.  Procedure tolerated well and without incident.

## 2015-10-25 DIAGNOSIS — R69 Illness, unspecified: Secondary | ICD-10-CM | POA: Diagnosis not present

## 2015-11-11 ENCOUNTER — Other Ambulatory Visit: Payer: Self-pay | Admitting: "Endocrinology

## 2015-11-14 ENCOUNTER — Other Ambulatory Visit: Payer: Self-pay | Admitting: "Endocrinology

## 2015-11-17 ENCOUNTER — Encounter (HOSPITAL_COMMUNITY): Payer: Medicare HMO

## 2015-11-18 DIAGNOSIS — R69 Illness, unspecified: Secondary | ICD-10-CM | POA: Diagnosis not present

## 2015-11-23 ENCOUNTER — Encounter (HOSPITAL_COMMUNITY): Payer: Medicare HMO | Attending: Oncology

## 2015-11-23 ENCOUNTER — Encounter (HOSPITAL_COMMUNITY): Payer: Self-pay

## 2015-11-23 VITALS — BP 164/56 | HR 69 | Temp 98.8°F | Resp 18

## 2015-11-23 DIAGNOSIS — D649 Anemia, unspecified: Secondary | ICD-10-CM | POA: Insufficient documentation

## 2015-11-23 DIAGNOSIS — C50911 Malignant neoplasm of unspecified site of right female breast: Secondary | ICD-10-CM | POA: Insufficient documentation

## 2015-11-23 DIAGNOSIS — E538 Deficiency of other specified B group vitamins: Secondary | ICD-10-CM

## 2015-11-23 MED ORDER — CYANOCOBALAMIN 1000 MCG/ML IJ SOLN
INTRAMUSCULAR | Status: AC
  Filled 2015-11-23: qty 1

## 2015-11-23 MED ORDER — CYANOCOBALAMIN 1000 MCG/ML IJ SOLN
1000.0000 ug | Freq: Once | INTRAMUSCULAR | Status: AC
  Administered 2015-11-23: 1000 ug via INTRAMUSCULAR

## 2015-11-23 NOTE — Progress Notes (Signed)
[name] presents today for injection per the provider's orders.  B12 administration without incident; see MAR for injection details.  Patient tolerated procedure well and without incident.  No questions or complaints noted at this time.

## 2015-11-23 NOTE — Patient Instructions (Signed)
[place] at [facility] Discharge Instructions  RECOMMENDATIONS MADE BY THE CONSULTANT AND ANY TEST RESULTS WILL BE SENT TO YOUR REFERRING PHYSICIAN.  B12 injection today. Return as scheduled for lab work and injections. Return as scheduled for office visit.   Thank you for choosing [place] at [facility] to provide your oncology and hematology care.  To afford each patient quality time with our provider, please arrive at least 15 minutes before your scheduled appointment time.    You need to re-schedule your appointment should you arrive 10 or more minutes late.  We strive to give you quality time with our providers, and arriving late affects you and other patients whose appointments are after yours.  Also, if you no show three or more times for appointments you may be dismissed from the clinic at the providers discretion.     Again, thank you for choosing [facility].  Our hope is that these requests will decrease the amount of time that you wait before being seen by our physicians.       _____________________________________________________________  Should you have questions after your visit to [facility], please contact our office at [phone] between the hours of 8:30 a.m. and 4:30 p.m.  Voicemails left after 4:30 p.m. will not be returned until the following business day.  For prescription refill requests, have your pharmacy contact our office.

## 2015-12-01 ENCOUNTER — Encounter (HOSPITAL_COMMUNITY): Payer: Medicare HMO

## 2015-12-14 ENCOUNTER — Other Ambulatory Visit: Payer: Self-pay | Admitting: "Endocrinology

## 2015-12-14 DIAGNOSIS — R69 Illness, unspecified: Secondary | ICD-10-CM | POA: Diagnosis not present

## 2015-12-15 ENCOUNTER — Other Ambulatory Visit: Payer: Self-pay | Admitting: "Endocrinology

## 2015-12-15 ENCOUNTER — Encounter (HOSPITAL_BASED_OUTPATIENT_CLINIC_OR_DEPARTMENT_OTHER): Payer: Medicare HMO

## 2015-12-15 ENCOUNTER — Encounter (HOSPITAL_COMMUNITY): Payer: Medicare HMO

## 2015-12-15 ENCOUNTER — Encounter (HOSPITAL_COMMUNITY): Payer: Medicare HMO | Attending: Oncology

## 2015-12-15 VITALS — BP 160/73 | HR 60 | Temp 98.5°F | Resp 20

## 2015-12-15 DIAGNOSIS — Z853 Personal history of malignant neoplasm of breast: Secondary | ICD-10-CM

## 2015-12-15 DIAGNOSIS — Z95828 Presence of other vascular implants and grafts: Secondary | ICD-10-CM

## 2015-12-15 DIAGNOSIS — Z452 Encounter for adjustment and management of vascular access device: Secondary | ICD-10-CM | POA: Diagnosis not present

## 2015-12-15 DIAGNOSIS — N342 Other urethritis: Secondary | ICD-10-CM | POA: Diagnosis not present

## 2015-12-15 DIAGNOSIS — G894 Chronic pain syndrome: Secondary | ICD-10-CM | POA: Diagnosis not present

## 2015-12-15 DIAGNOSIS — C50911 Malignant neoplasm of unspecified site of right female breast: Secondary | ICD-10-CM | POA: Insufficient documentation

## 2015-12-15 DIAGNOSIS — D649 Anemia, unspecified: Secondary | ICD-10-CM | POA: Insufficient documentation

## 2015-12-15 DIAGNOSIS — Z6839 Body mass index (BMI) 39.0-39.9, adult: Secondary | ICD-10-CM | POA: Diagnosis not present

## 2015-12-15 MED ORDER — HEPARIN SOD (PORK) LOCK FLUSH 100 UNIT/ML IV SOLN
INTRAVENOUS | Status: AC
  Filled 2015-12-15: qty 5

## 2015-12-15 MED ORDER — SODIUM CHLORIDE 0.9 % IJ SOLN
10.0000 mL | Freq: Once | INTRAMUSCULAR | Status: AC
  Administered 2015-12-15: 10 mL via INTRAVENOUS

## 2015-12-15 MED ORDER — HEPARIN SOD (PORK) LOCK FLUSH 100 UNIT/ML IV SOLN
500.0000 [IU] | Freq: Once | INTRAVENOUS | Status: AC
  Administered 2015-12-15: 500 [IU] via INTRAVENOUS

## 2015-12-15 NOTE — Patient Instructions (Signed)
[place] at [facility] Discharge Instructions  RECOMMENDATIONS MADE BY THE CONSULTANT AND ANY TEST RESULTS WILL BE SENT TO YOUR REFERRING PHYSICIAN.  Port flush today as scheduled. You are too early for B12 injection, not due until next week. Return as scheduled.  Thank you for choosing [place] at [facility] to provide your oncology and hematology care.  To afford each patient quality time with our provider, please arrive at least 15 minutes before your scheduled appointment time.    You need to re-schedule your appointment should you arrive 10 or more minutes late.  We strive to give you quality time with our providers, and arriving late affects you and other patients whose appointments are after yours.  Also, if you no show three or more times for appointments you may be dismissed from the clinic at the providers discretion.     Again, thank you for choosing [facility].  Our hope is that these requests will decrease the amount of time that you wait before being seen by our physicians.       _____________________________________________________________  Should you have questions after your visit to [facility], please contact our office at [phone] between the hours of 8:30 a.m. and 4:30 p.m.  Voicemails left after 4:30 p.m. will not be returned until the following business day.  For prescription refill requests, have your pharmacy contact our office.

## 2015-12-15 NOTE — Progress Notes (Signed)
[name] presented for Portacath access and flush. Proper placement of portacath confirmed by CXR. Portacath located left chest wall accessed with  H 20 needle. No blood return and port flushes easily without pain/discomfort. Portacath flushed with 24ml NS and 500U/37ml Heparin and needle removed intact. Procedure without incident. Patient tolerated procedure well.  Patient too early for B12 injection, last given 01/23/15 not due until next week.

## 2015-12-21 ENCOUNTER — Encounter (HOSPITAL_BASED_OUTPATIENT_CLINIC_OR_DEPARTMENT_OTHER): Payer: Medicare HMO

## 2015-12-21 VITALS — BP 134/48 | HR 69 | Temp 98.0°F | Resp 20

## 2015-12-21 DIAGNOSIS — E538 Deficiency of other specified B group vitamins: Secondary | ICD-10-CM | POA: Diagnosis not present

## 2015-12-21 MED ORDER — CYANOCOBALAMIN 1000 MCG/ML IJ SOLN
1000.0000 ug | Freq: Once | INTRAMUSCULAR | Status: AC
  Administered 2015-12-21: 1000 ug via INTRAMUSCULAR
  Filled 2015-12-21: qty 1

## 2015-12-21 NOTE — Progress Notes (Signed)
[name] presents today for injection per MD orders. B12 1000 mcg administered IM in left Upper Arm. Administration without incident. Patient tolerated well.

## 2015-12-21 NOTE — Patient Instructions (Signed)
[place] at [facility] Discharge Instructions  RECOMMENDATIONS MADE BY THE CONSULTANT AND ANY TEST RESULTS WILL BE SENT TO YOUR REFERRING PHYSICIAN.  Vitamin B12 1000 mcg injection given as ordered. Return as scheduled.  Thank you for choosing [place] at [facility] to provide your oncology and hematology care.  To afford each patient quality time with our provider, please arrive at least 15 minutes before your scheduled appointment time.    You need to re-schedule your appointment should you arrive 10 or more minutes late.  We strive to give you quality time with our providers, and arriving late affects you and other patients whose appointments are after yours.  Also, if you no show three or more times for appointments you may be dismissed from the clinic at the providers discretion.     Again, thank you for choosing [facility].  Our hope is that these requests will decrease the amount of time that you wait before being seen by our physicians.       _____________________________________________________________  Should you have questions after your visit to [facility], please contact our office at [phone] between the hours of 8:30 a.m. and 4:30 p.m.  Voicemails left after 4:30 p.m. will not be returned until the following business day.  For prescription refill requests, have your pharmacy contact our office.

## 2016-01-09 DIAGNOSIS — R69 Illness, unspecified: Secondary | ICD-10-CM | POA: Diagnosis not present

## 2016-01-13 ENCOUNTER — Other Ambulatory Visit: Payer: Self-pay | Admitting: "Endocrinology

## 2016-01-16 ENCOUNTER — Other Ambulatory Visit: Payer: Self-pay | Admitting: "Endocrinology

## 2016-01-19 ENCOUNTER — Ambulatory Visit (HOSPITAL_COMMUNITY): Payer: Self-pay

## 2016-01-20 ENCOUNTER — Ambulatory Visit (HOSPITAL_COMMUNITY): Payer: Self-pay

## 2016-01-23 ENCOUNTER — Ambulatory Visit (HOSPITAL_COMMUNITY): Payer: Self-pay

## 2016-01-26 ENCOUNTER — Encounter (HOSPITAL_COMMUNITY): Payer: Medicare HMO

## 2016-01-31 ENCOUNTER — Encounter (HOSPITAL_COMMUNITY): Payer: Medicare HMO | Attending: Oncology

## 2016-01-31 ENCOUNTER — Encounter (HOSPITAL_BASED_OUTPATIENT_CLINIC_OR_DEPARTMENT_OTHER): Payer: Medicare HMO

## 2016-01-31 ENCOUNTER — Encounter (HOSPITAL_COMMUNITY): Payer: Self-pay

## 2016-01-31 VITALS — BP 129/50 | HR 58 | Temp 97.8°F | Resp 20

## 2016-01-31 DIAGNOSIS — D649 Anemia, unspecified: Secondary | ICD-10-CM | POA: Insufficient documentation

## 2016-01-31 DIAGNOSIS — Z95828 Presence of other vascular implants and grafts: Secondary | ICD-10-CM

## 2016-01-31 DIAGNOSIS — E538 Deficiency of other specified B group vitamins: Secondary | ICD-10-CM

## 2016-01-31 DIAGNOSIS — C50911 Malignant neoplasm of unspecified site of right female breast: Secondary | ICD-10-CM | POA: Insufficient documentation

## 2016-01-31 MED ORDER — SODIUM CHLORIDE 0.9% FLUSH
10.0000 mL | INTRAVENOUS | Status: DC | PRN
  Administered 2016-01-31: 10 mL via INTRAVENOUS
  Filled 2016-01-31: qty 10

## 2016-01-31 MED ORDER — CYANOCOBALAMIN 1000 MCG/ML IJ SOLN
1000.0000 ug | Freq: Once | INTRAMUSCULAR | Status: AC
  Administered 2016-01-31: 1000 ug via INTRAMUSCULAR
  Filled 2016-01-31: qty 1

## 2016-01-31 MED ORDER — HEPARIN SOD (PORK) LOCK FLUSH 100 UNIT/ML IV SOLN
500.0000 [IU] | Freq: Once | INTRAVENOUS | Status: AC
  Administered 2016-01-31: 500 [IU] via INTRAVENOUS
  Filled 2016-01-31: qty 5

## 2016-01-31 NOTE — Patient Instructions (Signed)
Vitamin b12 injection and port flush today

## 2016-01-31 NOTE — Progress Notes (Signed)
..  [name] presents today for injection per the provider's orders.  Vitamin b12 administration without incident; see MAR for injection details.  Patient tolerated procedure well and without incident.  No questions or complaints noted at this time.

## 2016-01-31 NOTE — Progress Notes (Signed)
..  [name] presented for Portacath access and flush.   Portacath located lt chest wall accessed with  H 20 needle.  No blood return and flushes easily.  Portacath flushed with 30ml NS and 500U/58ml Heparin and needle removed intact.  Procedure tolerated well and without incident.

## 2016-02-09 ENCOUNTER — Encounter (HOSPITAL_COMMUNITY): Payer: Self-pay

## 2016-02-10 ENCOUNTER — Other Ambulatory Visit: Payer: Self-pay | Admitting: "Endocrinology

## 2016-02-13 ENCOUNTER — Other Ambulatory Visit: Payer: Self-pay | Admitting: "Endocrinology

## 2016-02-14 ENCOUNTER — Encounter (HOSPITAL_COMMUNITY): Payer: Self-pay

## 2016-02-14 ENCOUNTER — Ambulatory Visit (HOSPITAL_COMMUNITY): Payer: Self-pay

## 2016-02-20 DIAGNOSIS — E1165 Type 2 diabetes mellitus with hyperglycemia: Secondary | ICD-10-CM | POA: Diagnosis not present

## 2016-02-20 DIAGNOSIS — E1129 Type 2 diabetes mellitus with other diabetic kidney complication: Secondary | ICD-10-CM | POA: Diagnosis not present

## 2016-02-20 DIAGNOSIS — Z1389 Encounter for screening for other disorder: Secondary | ICD-10-CM | POA: Diagnosis not present

## 2016-02-20 DIAGNOSIS — L97501 Non-pressure chronic ulcer of other part of unspecified foot limited to breakdown of skin: Secondary | ICD-10-CM | POA: Diagnosis not present

## 2016-02-20 DIAGNOSIS — Z681 Body mass index (BMI) 19 or less, adult: Secondary | ICD-10-CM | POA: Diagnosis not present

## 2016-02-20 DIAGNOSIS — E782 Mixed hyperlipidemia: Secondary | ICD-10-CM | POA: Diagnosis not present

## 2016-02-20 DIAGNOSIS — I10 Essential (primary) hypertension: Secondary | ICD-10-CM | POA: Diagnosis not present

## 2016-02-21 ENCOUNTER — Other Ambulatory Visit: Payer: Self-pay | Admitting: "Endocrinology

## 2016-02-28 ENCOUNTER — Encounter (HOSPITAL_COMMUNITY): Payer: Self-pay

## 2016-02-28 ENCOUNTER — Encounter (HOSPITAL_COMMUNITY): Payer: Medicare HMO | Attending: Oncology

## 2016-02-28 VITALS — BP 154/49 | HR 52 | Temp 98.3°F | Resp 20

## 2016-02-28 DIAGNOSIS — C50911 Malignant neoplasm of unspecified site of right female breast: Secondary | ICD-10-CM | POA: Insufficient documentation

## 2016-02-28 DIAGNOSIS — D649 Anemia, unspecified: Secondary | ICD-10-CM | POA: Insufficient documentation

## 2016-02-28 DIAGNOSIS — E538 Deficiency of other specified B group vitamins: Secondary | ICD-10-CM | POA: Diagnosis not present

## 2016-02-28 MED ORDER — CYANOCOBALAMIN 1000 MCG/ML IJ SOLN
INTRAMUSCULAR | Status: AC
  Filled 2016-02-28: qty 1

## 2016-02-28 MED ORDER — CYANOCOBALAMIN 1000 MCG/ML IJ SOLN
1000.0000 ug | Freq: Once | INTRAMUSCULAR | Status: AC
  Administered 2016-02-28: 1000 ug via INTRAMUSCULAR

## 2016-02-28 NOTE — Progress Notes (Signed)
[name] presents today for injection per the provider's orders.  Vitamin b12 administration without incident; see MAR for injection details.  Patient tolerated procedure well and without incident.  No questions or complaints noted at this time.

## 2016-03-09 ENCOUNTER — Encounter (HOSPITAL_COMMUNITY): Payer: Self-pay | Admitting: Emergency Medicine

## 2016-03-09 ENCOUNTER — Emergency Department (HOSPITAL_COMMUNITY): Payer: Medicare HMO

## 2016-03-09 ENCOUNTER — Emergency Department (HOSPITAL_COMMUNITY)
Admission: EM | Admit: 2016-03-09 | Discharge: 2016-03-09 | Disposition: A | Discharge status: Home / Self Care | Payer: Medicare HMO | Attending: Emergency Medicine | Admitting: Emergency Medicine

## 2016-03-09 DIAGNOSIS — N189 Chronic kidney disease, unspecified: Secondary | ICD-10-CM | POA: Insufficient documentation

## 2016-03-09 DIAGNOSIS — J45909 Unspecified asthma, uncomplicated: Secondary | ICD-10-CM | POA: Diagnosis not present

## 2016-03-09 DIAGNOSIS — R600 Localized edema: Secondary | ICD-10-CM | POA: Diagnosis not present

## 2016-03-09 DIAGNOSIS — Z7984 Long term (current) use of oral hypoglycemic drugs: Secondary | ICD-10-CM | POA: Insufficient documentation

## 2016-03-09 DIAGNOSIS — Z6841 Body Mass Index (BMI) 40.0 and over, adult: Secondary | ICD-10-CM | POA: Diagnosis not present

## 2016-03-09 DIAGNOSIS — R109 Unspecified abdominal pain: Secondary | ICD-10-CM | POA: Diagnosis not present

## 2016-03-09 DIAGNOSIS — Z87891 Personal history of nicotine dependence: Secondary | ICD-10-CM | POA: Insufficient documentation

## 2016-03-09 DIAGNOSIS — Z7982 Long term (current) use of aspirin: Secondary | ICD-10-CM | POA: Diagnosis not present

## 2016-03-09 DIAGNOSIS — G894 Chronic pain syndrome: Secondary | ICD-10-CM | POA: Diagnosis not present

## 2016-03-09 DIAGNOSIS — E785 Hyperlipidemia, unspecified: Secondary | ICD-10-CM | POA: Insufficient documentation

## 2016-03-09 DIAGNOSIS — R69 Illness, unspecified: Secondary | ICD-10-CM | POA: Diagnosis not present

## 2016-03-09 DIAGNOSIS — R300 Dysuria: Secondary | ICD-10-CM | POA: Diagnosis not present

## 2016-03-09 DIAGNOSIS — R197 Diarrhea, unspecified: Secondary | ICD-10-CM

## 2016-03-09 DIAGNOSIS — Z1389 Encounter for screening for other disorder: Secondary | ICD-10-CM | POA: Diagnosis not present

## 2016-03-09 DIAGNOSIS — I129 Hypertensive chronic kidney disease with stage 1 through stage 4 chronic kidney disease, or unspecified chronic kidney disease: Secondary | ICD-10-CM | POA: Insufficient documentation

## 2016-03-09 DIAGNOSIS — K921 Melena: Secondary | ICD-10-CM | POA: Diagnosis present

## 2016-03-09 DIAGNOSIS — F329 Major depressive disorder, single episode, unspecified: Secondary | ICD-10-CM | POA: Diagnosis not present

## 2016-03-09 DIAGNOSIS — Z79899 Other long term (current) drug therapy: Secondary | ICD-10-CM | POA: Diagnosis not present

## 2016-03-09 DIAGNOSIS — E669 Obesity, unspecified: Secondary | ICD-10-CM | POA: Insufficient documentation

## 2016-03-09 DIAGNOSIS — E1122 Type 2 diabetes mellitus with diabetic chronic kidney disease: Secondary | ICD-10-CM | POA: Diagnosis not present

## 2016-03-09 DIAGNOSIS — E11319 Type 2 diabetes mellitus with unspecified diabetic retinopathy without macular edema: Secondary | ICD-10-CM | POA: Insufficient documentation

## 2016-03-09 DIAGNOSIS — R1110 Vomiting, unspecified: Secondary | ICD-10-CM | POA: Diagnosis not present

## 2016-03-09 LAB — CBC WITH DIFFERENTIAL/PLATELET
BASOS ABS: 0 10*3/uL (ref 0.0–0.1)
BASOS PCT: 0 %
EOS ABS: 0.1 10*3/uL (ref 0.0–0.7)
EOS PCT: 1 %
HEMATOCRIT: 34.2 % — AB (ref 36.0–46.0)
Hemoglobin: 11 g/dL — ABNORMAL LOW (ref 12.0–15.0)
Lymphocytes Relative: 25 %
Lymphs Abs: 2.3 10*3/uL (ref 0.7–4.0)
MCH: 28.8 pg (ref 26.0–34.0)
MCHC: 32.2 g/dL (ref 30.0–36.0)
MCV: 89.5 fL (ref 78.0–100.0)
MONO ABS: 0.6 10*3/uL (ref 0.1–1.0)
MONOS PCT: 7 %
NEUTROS ABS: 6.1 10*3/uL (ref 1.7–7.7)
Neutrophils Relative %: 67 %
PLATELETS: 195 10*3/uL (ref 150–400)
RBC: 3.82 MIL/uL — ABNORMAL LOW (ref 3.87–5.11)
RDW: 13.8 % (ref 11.5–15.5)
WBC: 9.2 10*3/uL (ref 4.0–10.5)

## 2016-03-09 LAB — POC OCCULT BLOOD, ED: FECAL OCCULT BLD: POSITIVE — AB

## 2016-03-09 LAB — COMPREHENSIVE METABOLIC PANEL
ALBUMIN: 3.3 g/dL — AB (ref 3.5–5.0)
ALK PHOS: 21 U/L — AB (ref 38–126)
ALT: 12 U/L — ABNORMAL LOW (ref 14–54)
ANION GAP: 7 (ref 5–15)
AST: 17 U/L (ref 15–41)
BILIRUBIN TOTAL: 0.2 mg/dL — AB (ref 0.3–1.2)
BUN: 38 mg/dL — ABNORMAL HIGH (ref 6–20)
CALCIUM: 8.7 mg/dL — AB (ref 8.9–10.3)
CO2: 24 mmol/L (ref 22–32)
Chloride: 109 mmol/L (ref 101–111)
Creatinine, Ser: 2.01 mg/dL — ABNORMAL HIGH (ref 0.44–1.00)
GFR calc non Af Amer: 22 mL/min — ABNORMAL LOW (ref >60)
GFR, EST AFRICAN AMERICAN: 26 mL/min — AB (ref >60)
GLUCOSE: 71 mg/dL (ref 65–99)
POTASSIUM: 3.8 mmol/L (ref 3.5–5.1)
Sodium: 140 mmol/L (ref 135–145)
TOTAL PROTEIN: 6.3 g/dL — AB (ref 6.5–8.1)

## 2016-03-09 LAB — URINALYSIS, ROUTINE W REFLEX MICROSCOPIC
BILIRUBIN URINE: NEGATIVE
Glucose, UA: NEGATIVE mg/dL
KETONES UR: NEGATIVE mg/dL
NITRITE: NEGATIVE
PH: 5 (ref 5.0–8.0)
PROTEIN: 100 mg/dL — AB
Specific Gravity, Urine: 1.025 (ref 1.005–1.030)

## 2016-03-09 LAB — URINE MICROSCOPIC-ADD ON

## 2016-03-09 LAB — TYPE AND SCREEN
ABO/RH(D): A POS
ANTIBODY SCREEN: NEGATIVE

## 2016-03-09 MED ORDER — PANTOPRAZOLE SODIUM 20 MG PO TBEC: 20.0000 mg | DELAYED_RELEASE_TABLET | Freq: Every day | ORAL | Status: DC

## 2016-03-09 MED ORDER — PANTOPRAZOLE SODIUM 40 MG IV SOLR
40.0000 mg | Freq: Once | INTRAVENOUS | Status: AC
  Administered 2016-03-09: 40 mg via INTRAVENOUS
  Filled 2016-03-09: qty 40

## 2016-03-09 MED ORDER — ONDANSETRON 4 MG PO TBDP: ORAL_TABLET | ORAL | Status: DC

## 2016-03-09 MED ORDER — SODIUM CHLORIDE 0.9 % IV BOLUS (SEPSIS)
1000.0000 mL | Freq: Once | INTRAVENOUS | Status: AC
  Administered 2016-03-09: 1000 mL via INTRAVENOUS

## 2016-03-09 NOTE — ED Notes (Addendum)
PT c/o black runny stools x6 a day x5 days and also c/o painful urination x4 days. PT states she takes an $Remove'81mg'[id number]$  aspirin daily. PT also c/o bilateral lower leg edema and generalized weakness as well.

## 2016-03-09 NOTE — ED Notes (Signed)
Gave patient crackers and ginger ale as per nurse.

## 2016-03-09 NOTE — ED Notes (Signed)
Patient in wheelchair to bathroom, clean catch instructions given and advised pt to bring specimen back to room as well.

## 2016-03-09 NOTE — ED Provider Notes (Signed)
CSN: [id number]     Arrival date & time 03/09/16  1417 History   First MD Initiated Contact with Patient 03/09/16 1503     Chief Complaint  Patient presents with  . Melena     (Consider location/radiation/quality/duration/timing/severity/associated sxs/prior Treatment) Patient is a 82 y.o. female presenting with vomiting. The history is provided by the patient (Patient complains of black stools and vomiting and stomach cramps for a few days).  Emesis Severity:  Mild Timing:  Constant Quality:  Undigested food Able to tolerate:  Liquids Progression:  Unchanged Chronicity:  New Associated symptoms: diarrhea   Associated symptoms: no abdominal pain and no headaches     Past Medical History  Diagnosis Date  . Anemia   . IBS (irritable bowel syndrome)     diverticulosis, gastroesophageal reflux disease  . Hypertension   . Asthma   . Diabetes mellitus   . Hyperlipidemia   . Arthritis   . Headaches, cluster   . Lower back pain   . Aortic stenosis     Mild  . Depression   . Fibromyalgia   . Cerebrovascular disease     with a 27% LICA; repeat study in 10/2009-no obstructive disease; modest ASVD  . Falls infrequently 01/2010    fracture of pelvis and left humerus  . Atrial flutter ([place]) 2002  . DJD (degenerative joint disease) 11/14/2011  . Obesity 11/14/2011  . Diabetic retinopathy   . Decreased bone density   . Breast carcinoma ([place])   . Chronic kidney disease     Creatinine-1.5 in 2010 and 2.5-3 in 2011; 1.5-10/2010;  Klebsiella UTI-10/2010. urine protein 36 mg/dl, mildly elevated  . B12 deficiency 06/20/2015  . B12 deficiency 06/20/2015   Past Surgical History  Procedure Laterality Date  . Mastectomy      Right breast for carcinoma  . Lumbar disc surgery      lumbosacral spine procedure x 2  . Appendectomy    . Cholecystectomy    . Cataract extraction, bilateral      Lens implants  . Central venous catheter tunneled insertion single lumen    . Colonoscopy  2012   . Eye surgery  nov 2012    for diabetic retinopathy   Family History  Problem Relation Age of Onset  . Alzheimer's disease Mother   . Heart attack Father   . Aneurysm Sister   . Coronary artery disease Brother    Social History  Substance Use Topics  . Smoking status: Former Research scientist (life sciences)  . Smokeless tobacco: Never Used  . Alcohol Use: No   OB History    No data available     Review of Systems  Constitutional: Negative for appetite change and fatigue.  HENT: Negative for congestion, ear discharge and sinus pressure.   Eyes: Negative for discharge.  Respiratory: Negative for cough.   Cardiovascular: Negative for chest pain.  Gastrointestinal: Positive for vomiting and diarrhea. Negative for abdominal pain.  Genitourinary: Negative for frequency and hematuria.  Musculoskeletal: Negative for back pain.  Skin: Negative for rash.  Neurological: Negative for seizures and headaches.  Psychiatric/Behavioral: Negative for hallucinations.      Allergies  Imodium; Mold extract; Morphine and related; Niaspan; and Pollen extract  Home Medications   Prior to Admission medications   Medication Sig Start Date End Date Taking? Authorizing Provider  albuterol (PROAIR HFA) 108 (90 BASE) MCG/ACT inhaler Inhale 2 puffs into the lungs every 6 (six) hours as needed for wheezing or shortness of breath. 06/16/14  Yes  Baird Cancer, PA-C  aspirin EC 81 MG tablet Take 81 mg by mouth daily.   Yes Historical Provider, MD  esomeprazole (NEXIUM) 40 MG capsule Take 40 mg by mouth every morning.    Yes Historical Provider, MD  fenofibrate (TRICOR) 145 MG tablet Take 145 mg by mouth daily.   Yes Historical Provider, MD  furosemide (LASIX) 40 MG tablet Take 40 mg by mouth 2 (two) times daily as needed for fluid.  09/02/14  Yes Historical Provider, MD  glipiZIDE (GLUCOTROL) 10 MG tablet Take 10 mg by mouth 2 (two) times daily.    Yes Historical Provider, MD  HYDROcodone-acetaminophen (NORCO) 10-325 MG per  tablet Take 1 tablet by mouth every 4 (four) hours as needed. For pain 09/03/14  Yes Historical Provider, MD  lisinopril-hydrochlorothiazide (PRINZIDE,ZESTORETIC) 20-25 MG per tablet Take 1 tablet by mouth daily. 05/18/15  Yes Herminio Commons, MD  metoprolol (LOPRESSOR) 50 MG tablet Take 1 tablet (50 mg total) by mouth 2 (two) times daily. 03/21/15  Yes Herminio Commons, MD  NOVOLOG FLEXPEN 100 UNIT/ML FlexPen INJECT 10 UNITS SUBCUTANEOUSLY THREE TIMES DAILY. 02/22/16  Yes Cassandria Anger, MD  PARoxetine (PAXIL) 40 MG tablet Take 40 mg by mouth every morning.     Yes Historical Provider, MD  pregabalin (LYRICA) 50 MG capsule Take 50 mg by mouth 3 (three) times daily.    Yes Historical Provider, MD  raloxifene (EVISTA) 60 MG tablet Take 60 mg by mouth daily.   Yes Historical Provider, MD  rosuvastatin (CRESTOR) 40 MG tablet Take 1 tablet (40 mg total) by mouth at bedtime. 07/20/15  Yes Herminio Commons, MD  traMADol (ULTRAM) 50 MG tablet Take 50-100 mg by mouth every 6 (six) hours as needed for moderate pain or severe pain.    Yes Historical Provider, MD  BD PEN NEEDLE NANO U/F 32G X 4 MM MISC USE TO TEST FOUR TIMES DAILY. 10/14/15   Cassandria Anger, MD  ondansetron (ZOFRAN ODT) 4 MG disintegrating tablet 4mg  ODT q4 hours prn nausea/vomit 03/09/16   Milton Ferguson, MD  pantoprazole (PROTONIX) 20 MG tablet Take 1 tablet (20 mg total) by mouth daily. 03/09/16   Milton Ferguson, MD  potassium chloride SA (K-DUR,KLOR-CON) 20 MEQ tablet Take 1 tablet (20 mEq total) by mouth daily. Patient not taking: Reported on 03/09/2016 03/05/15   Daleen Bo, MD   BP 166/77 mmHg  Pulse 71  Temp(Src) 98.7 F (37.1 C) (Oral)  Resp 18  Ht 5\' 11"  (1.803 m)  Wt 230 lb (104.327 kg)  BMI 32.09 kg/m2  SpO2 97% Physical Exam  Constitutional: She is oriented to person, place, and time. She appears well-developed.  HENT:  Head: Normocephalic.  Eyes: Conjunctivae and EOM are normal. No scleral icterus.  Neck:  Neck supple. No thyromegaly present.  Cardiovascular: Normal rate and regular rhythm.  Exam reveals no gallop and no friction rub.   No murmur heard. Pulmonary/Chest: No stridor. She has no wheezes. She has no rales. She exhibits no tenderness.  Abdominal: She exhibits no distension. There is no tenderness. There is no rebound.  Genitourinary:  Dark heme positive stool  Musculoskeletal: Normal range of motion. She exhibits no edema.  Lymphadenopathy:    She has no cervical adenopathy.  Neurological: She is oriented to person, place, and time. She exhibits normal muscle tone. Coordination normal.  Skin: No rash noted. No erythema.  Psychiatric: She has a normal mood and affect. Her behavior is normal.  ED Course  Procedures (including critical care time) Labs Review Labs Reviewed  COMPREHENSIVE METABOLIC PANEL - Abnormal; Notable for the following:    BUN 38 (*)    Creatinine, Ser 2.01 (*)    Calcium 8.7 (*)    Total Protein 6.3 (*)    Albumin 3.3 (*)    ALT 12 (*)    Alkaline Phosphatase 21 (*)    Total Bilirubin 0.2 (*)    GFR calc non Af Amer 22 (*)    GFR calc Af Amer 26 (*)    All other components within normal limits  CBC WITH DIFFERENTIAL/PLATELET - Abnormal; Notable for the following:    RBC 3.82 (*)    Hemoglobin 11.0 (*)    HCT 34.2 (*)    All other components within normal limits  POC OCCULT BLOOD, ED - Abnormal; Notable for the following:    Fecal Occult Bld POSITIVE (*)    All other components within normal limits  URINE CULTURE  URINALYSIS, ROUTINE W REFLEX MICROSCOPIC (NOT AT [facility])  TYPE AND SCREEN    Imaging Review Dg Abd Acute W/chest  03/09/2016  CLINICAL DATA:  Abdominal pain, black stools, dysuria. Status post cholecystectomy and appendectomy. EXAM: DG ABDOMEN ACUTE W/ 1V CHEST COMPARISON:  Chest radiographs dated 03/05/2015. FINDINGS: Lungs are clear.  No pleural effusion or pneumothorax. Left chest port terminates in the lower SVC. Cardiomegaly.  Nonobstructive bowel gas pattern. No evidence of free air under the diaphragm on the upright view. Lumbar spine fixation hardware. Cholecystectomy clips. IMPRESSION: No evidence of acute cardiopulmonary disease. No evidence of small bowel obstruction or free air. Electronically Signed   By: Julian Hy M.D.   On: 03/09/2016 17:19   I have personally reviewed and evaluated these images and lab results as part of my medical decision-making.   EKG Interpretation None      MDM   Final diagnoses:  Diarrhea of presumed infectious origin    Patient's hemoglobin is 11 and has only gone down 0.4 since she was not orthostatic. Suspect heme positive stool secondary to aspirin.   Patient will be put on protonic for possible gastritis. She will follow-up with her family doctor next week. Patient did not want to wait for the urinalysis to come back before she is discharged. Culture urine culture will be done  j  Milton Ferguson, MD 03/09/16 [phone]

## 2016-03-09 NOTE — ED Notes (Signed)
Patient given discharge instruction, verbalized understand. IV removed, band aid applied. Patient ambulatory out of the department.  

## 2016-03-09 NOTE — ED Notes (Signed)
Pt wanting to leave, called lab for results, states it would be 10 more minute, advise pt and family, MD asking pt to wait.

## 2016-03-09 NOTE — Discharge Instructions (Signed)
Follow up with your md next week. °

## 2016-03-12 LAB — URINE CULTURE: Culture: >=100000 — AB

## 2016-03-13 NOTE — Progress Notes (Signed)
ED Antimicrobial Stewardship Positive Culture Follow Up   [name] is an 82 y.o. female who presented to [facility] on 03/09/2016 with a chief complaint of  Chief Complaint  Patient presents with  . Melena    Recent Results (from the past 720 hour(s))  Urine culture     Status: Abnormal   Collection Time: 03/09/16  7:15 PM  Result Value Ref Range Status   Specimen Description URINE, CLEAN CATCH  Final   Special Requests NONE  Final   Culture >=100,000 COLONIES/mL PROTEUS MIRABILIS (A)  Final   Report Status 03/12/2016 FINAL  Final   Organism ID, Bacteria PROTEUS MIRABILIS (A)  Final      Susceptibility   Proteus mirabilis - MIC*    AMPICILLIN <=2 SENSITIVE Sensitive     CEFAZOLIN <=4 SENSITIVE Sensitive     CEFTRIAXONE <=1 SENSITIVE Sensitive     CIPROFLOXACIN <=0.25 SENSITIVE Sensitive     GENTAMICIN <=1 SENSITIVE Sensitive     IMIPENEM 2 SENSITIVE Sensitive     NITROFURANTOIN 128 RESISTANT Resistant     TRIMETH/SULFA <=20 SENSITIVE Sensitive     AMPICILLIN/SULBACTAM <=2 SENSITIVE Sensitive     PIP/TAZO <=4 SENSITIVE Sensitive     * >=100,000 COLONIES/mL PROTEUS MIRABILIS   Pt presented with painful urination x 4 days. Afebrile.   [x]  Patient discharged originally without antimicrobial agent and treatment is now indicated  New antibiotic prescription: Cephalexin 500mg  PO BID x 5 days   ED Provider: Irena Cords, PA-C  [name], PharmD Pharmacy Resident  Pager: [phone] 03/13/2016 9:14 AM

## 2016-03-14 ENCOUNTER — Telehealth: Payer: Self-pay | Admitting: *Deleted

## 2016-03-14 NOTE — ED Notes (Signed)
Post ED Visit - Positive Culture Follow-up: Successful Patient Follow-Up  Culture assessed and recommendations reviewed by: []  Elenor Quinones, Pharm.D. []  Heide Guile, Pharm.D., BCPS []  Parks Neptune, Pharm.D. []  Alycia Rossetti, Pharm.D., BCPS []  [place], Pharm.D., BCPS, AAHIVP []  Legrand Como, Pharm.D., BCPS, AAHIVP []  Milus Glazier, Pharm.D. []  Stephens November, Pharm.D. Irena Cords, PA-C  Positive urine culture  [x]  Patient discharged without antimicrobial prescription and treatment is now indicated []  Organism is resistant to prescribed ED discharge antimicrobial []  Patient with positive blood cultures  Changes discussed with ED provider, Irena Cords, PA-C New antibiotic prescription Cephalexin 500mg  PO BID x 5 days Called to [facility] [place] [place]  Contacted patient, date 03/14/2016  time [place] 03/14/2016, 10:11 AM

## 2016-03-27 ENCOUNTER — Encounter (HOSPITAL_COMMUNITY): Payer: Self-pay

## 2016-03-27 ENCOUNTER — Ambulatory Visit (HOSPITAL_COMMUNITY): Payer: Self-pay

## 2016-04-02 ENCOUNTER — Encounter (HOSPITAL_COMMUNITY): Payer: Medicare HMO | Attending: Oncology

## 2016-04-02 ENCOUNTER — Encounter (HOSPITAL_COMMUNITY): Payer: Medicare HMO

## 2016-04-02 ENCOUNTER — Encounter (HOSPITAL_COMMUNITY): Payer: Self-pay

## 2016-04-02 VITALS — BP 159/83 | HR 59 | Temp 97.7°F | Resp 20

## 2016-04-02 DIAGNOSIS — E538 Deficiency of other specified B group vitamins: Secondary | ICD-10-CM | POA: Diagnosis not present

## 2016-04-02 DIAGNOSIS — Z95828 Presence of other vascular implants and grafts: Secondary | ICD-10-CM

## 2016-04-02 DIAGNOSIS — D649 Anemia, unspecified: Secondary | ICD-10-CM | POA: Insufficient documentation

## 2016-04-02 DIAGNOSIS — C50911 Malignant neoplasm of unspecified site of right female breast: Secondary | ICD-10-CM | POA: Insufficient documentation

## 2016-04-02 MED ORDER — CYANOCOBALAMIN 1000 MCG/ML IJ SOLN
INTRAMUSCULAR | Status: AC
  Filled 2016-04-02: qty 1

## 2016-04-02 MED ORDER — CYANOCOBALAMIN 1000 MCG/ML IJ SOLN
1000.0000 ug | Freq: Once | INTRAMUSCULAR | Status: AC
  Administered 2016-04-02: 1000 ug via INTRAMUSCULAR

## 2016-04-02 MED ORDER — HEPARIN SOD (PORK) LOCK FLUSH 100 UNIT/ML IV SOLN
500.0000 [IU] | Freq: Once | INTRAVENOUS | Status: AC
  Administered 2016-04-02: 500 [IU] via INTRAVENOUS

## 2016-04-02 MED ORDER — HEPARIN SOD (PORK) LOCK FLUSH 100 UNIT/ML IV SOLN
INTRAVENOUS | Status: AC
  Filled 2016-04-02: qty 5

## 2016-04-02 MED ORDER — SODIUM CHLORIDE 0.9% FLUSH
10.0000 mL | INTRAVENOUS | Status: DC | PRN
  Administered 2016-04-02: 10 mL via INTRAVENOUS
  Filled 2016-04-02: qty 10

## 2016-04-02 NOTE — Progress Notes (Signed)
[name] presented for Portacath access and flush.  Proper placement of portacath confirmed by CXR.  Portacath located lt chest wall accessed with  H 20 needle.  Good blood return present. Portacath flushed with 29ml NS and 500U/19ml Heparin and needle removed intact.  Procedure tolerated well and without incident.

## 2016-04-02 NOTE — Progress Notes (Signed)
[name] presents today for injection per the provider's orders.  Vitamin b12 administration without incident; see MAR for injection details.  Patient tolerated procedure well and without incident.  No questions or complaints noted at this time.

## 2016-04-27 ENCOUNTER — Other Ambulatory Visit: Payer: Self-pay

## 2016-04-27 MED ORDER — INSULIN ASPART 100 UNIT/ML FLEXPEN: PEN_INJECTOR | SUBCUTANEOUS | Status: DC

## 2016-05-02 ENCOUNTER — Encounter (HOSPITAL_BASED_OUTPATIENT_CLINIC_OR_DEPARTMENT_OTHER): Payer: Medicare HMO

## 2016-05-02 ENCOUNTER — Encounter (HOSPITAL_COMMUNITY): Payer: Self-pay

## 2016-05-02 VITALS — BP 157/72 | HR 58 | Temp 98.1°F | Resp 18

## 2016-05-02 DIAGNOSIS — E538 Deficiency of other specified B group vitamins: Secondary | ICD-10-CM

## 2016-05-02 MED ORDER — CYANOCOBALAMIN 1000 MCG/ML IJ SOLN
1000.0000 ug | Freq: Once | INTRAMUSCULAR | Status: AC
  Administered 2016-05-02: 1000 ug via INTRAMUSCULAR
  Filled 2016-05-02: qty 1

## 2016-05-02 NOTE — Progress Notes (Signed)
[name] presents today for injection per the provider's orders.  B12 administration without incident; see MAR for injection details.  Patient tolerated procedure well and without incident.  No questions or complaints noted at this time.

## 2016-05-02 NOTE — Patient Instructions (Signed)
[facility] at [facility] Discharge Instructions  RECOMMENDATIONS MADE BY THE CONSULTANT AND ANY TEST RESULTS WILL BE SENT TO YOUR REFERRING PHYSICIAN.  B12 injection today. Return as scheduled for injections. Return as scheduled for lab work and office visit.  Thank you for choosing [facility] at [facility] to provide your oncology and hematology care.  To afford each patient quality time with our provider, please arrive at least 15 minutes before your scheduled appointment time.   Beginning January 23rd 2017 lab work for the Cancer Center will be done in the  Main lab at [place] on 1st floor. If you have a lab appointment with the Cancer Center please come in thru the  Main Entrance and check in at the main information desk  You need to re-schedule your appointment should you arrive 10 or more minutes late.  We strive to give you quality time with our providers, and arriving late affects you and other patients whose appointments are after yours.  Also, if you no show three or more times for appointments you may be dismissed from the clinic at the providers discretion.     Again, thank you for choosing [facility].  Our hope is that these requests will decrease the amount of time that you wait before being seen by our physicians.       _____________________________________________________________  Should you have questions after your visit to [facility], please contact our office at [phone] between the hours of 8:30 a.m. and 4:30 p.m.  Voicemails left after 4:30 p.m. will not be returned until the following business day.  For prescription refill requests, have your pharmacy contact our office.         Resources For Cancer Patients and their Caregivers ? American Cancer Society: Can assist with transportation, wigs, general needs, runs Look Good Feel Better.        1-888-227-6333 ? Cancer  Care: Provides financial assistance, online support groups, medication/co-pay assistance.  1-800-813-HOPE (4673) ? [facility] Assists [place] cancer patients and their families through emotional , educational and financial support.  336-427-4357 ? [place] DSS Where to apply for food stamps, Medicaid and utility assistance. 336-342-1394 ? RCATS: Transportation to medical appointments. 336-347-2287 ? Social Security Administration: May apply for disability if have a Stage IV cancer. 336-342-7796 1-800-772-1213 ? [place] Aging, Disability and Transit Services: Assists with nutrition, care and transit needs. 336-349-2343  Cancer Center Support Programs: @[email or web address]@ > Cancer Support Group  2nd Tuesday of the month 1pm-2pm, Journey Room  > Creative Journey  3rd Tuesday of the month 1130am-1pm, Journey Room  > Look Good Feel Better  1st Wednesday of the month 10am-12 noon, Journey Room (Call American [employer] to register 1-800-395-5775)   

## 2016-05-07 DIAGNOSIS — G894 Chronic pain syndrome: Secondary | ICD-10-CM | POA: Diagnosis not present

## 2016-05-07 DIAGNOSIS — Z1389 Encounter for screening for other disorder: Secondary | ICD-10-CM | POA: Diagnosis not present

## 2016-05-07 DIAGNOSIS — Z6841 Body Mass Index (BMI) 40.0 and over, adult: Secondary | ICD-10-CM | POA: Diagnosis not present

## 2016-05-25 DIAGNOSIS — E1165 Type 2 diabetes mellitus with hyperglycemia: Secondary | ICD-10-CM | POA: Diagnosis not present

## 2016-05-25 DIAGNOSIS — G894 Chronic pain syndrome: Secondary | ICD-10-CM | POA: Diagnosis not present

## 2016-05-25 DIAGNOSIS — K219 Gastro-esophageal reflux disease without esophagitis: Secondary | ICD-10-CM | POA: Diagnosis not present

## 2016-05-25 DIAGNOSIS — E669 Obesity, unspecified: Secondary | ICD-10-CM | POA: Diagnosis not present

## 2016-05-25 DIAGNOSIS — Z681 Body mass index (BMI) 19 or less, adult: Secondary | ICD-10-CM | POA: Diagnosis not present

## 2016-05-25 DIAGNOSIS — M810 Age-related osteoporosis without current pathological fracture: Secondary | ICD-10-CM | POA: Diagnosis not present

## 2016-05-25 DIAGNOSIS — N189 Chronic kidney disease, unspecified: Secondary | ICD-10-CM | POA: Diagnosis not present

## 2016-05-26 DIAGNOSIS — R69 Illness, unspecified: Secondary | ICD-10-CM | POA: Diagnosis not present

## 2016-05-28 ENCOUNTER — Encounter (HOSPITAL_COMMUNITY): Payer: Self-pay

## 2016-06-01 ENCOUNTER — Ambulatory Visit (HOSPITAL_COMMUNITY): Payer: Self-pay

## 2016-06-01 ENCOUNTER — Encounter (HOSPITAL_COMMUNITY): Payer: Self-pay

## 2016-06-11 ENCOUNTER — Encounter (HOSPITAL_COMMUNITY): Payer: Medicare HMO

## 2016-06-11 ENCOUNTER — Encounter (HOSPITAL_COMMUNITY): Payer: Medicare HMO | Attending: Oncology

## 2016-06-11 ENCOUNTER — Encounter (HOSPITAL_COMMUNITY): Payer: Self-pay

## 2016-06-11 VITALS — BP 149/75 | HR 65 | Temp 98.0°F | Resp 18

## 2016-06-11 DIAGNOSIS — E538 Deficiency of other specified B group vitamins: Secondary | ICD-10-CM

## 2016-06-11 DIAGNOSIS — G894 Chronic pain syndrome: Secondary | ICD-10-CM | POA: Diagnosis not present

## 2016-06-11 DIAGNOSIS — C50911 Malignant neoplasm of unspecified site of right female breast: Secondary | ICD-10-CM | POA: Insufficient documentation

## 2016-06-11 DIAGNOSIS — M810 Age-related osteoporosis without current pathological fracture: Secondary | ICD-10-CM | POA: Diagnosis not present

## 2016-06-11 DIAGNOSIS — D649 Anemia, unspecified: Secondary | ICD-10-CM | POA: Insufficient documentation

## 2016-06-11 DIAGNOSIS — E113391 Type 2 diabetes mellitus with moderate nonproliferative diabetic retinopathy without macular edema, right eye: Secondary | ICD-10-CM | POA: Diagnosis not present

## 2016-06-11 DIAGNOSIS — Z95828 Presence of other vascular implants and grafts: Secondary | ICD-10-CM

## 2016-06-11 MED ORDER — HEPARIN SOD (PORK) LOCK FLUSH 100 UNIT/ML IV SOLN
500.0000 [IU] | Freq: Once | INTRAVENOUS | Status: AC
  Administered 2016-06-11: 500 [IU] via INTRAVENOUS

## 2016-06-11 MED ORDER — SODIUM CHLORIDE 0.9% FLUSH
10.0000 mL | INTRAVENOUS | Status: DC | PRN
  Administered 2016-06-11: 10 mL via INTRAVENOUS
  Filled 2016-06-11: qty 10

## 2016-06-11 MED ORDER — CYANOCOBALAMIN 1000 MCG/ML IJ SOLN
1000.0000 ug | Freq: Once | INTRAMUSCULAR | Status: AC
  Administered 2016-06-11: 1000 ug via INTRAMUSCULAR
  Filled 2016-06-11: qty 1

## 2016-06-11 NOTE — Progress Notes (Signed)
See injection encounter.

## 2016-06-11 NOTE — Progress Notes (Signed)
[name] presents today for injection per the provider's orders.  B12 administration without incident; see MAR for injection details.  Patient tolerated procedure well and without incident.  No questions or complaints noted at this time.  [name] presented for Portacath access and flush.  Portacath located left chest wall accessed with  H 20 needle.  Good blood return present. Portacath flushed with 71ml NS and 500U/72ml Heparin and needle removed intact.  Procedure tolerated well and without incident.

## 2016-06-11 NOTE — Patient Instructions (Signed)
[name] at [facility] Discharge Instructions  RECOMMENDATIONS MADE BY THE CONSULTANT AND ANY TEST RESULTS WILL BE SENT TO YOUR REFERRING PHYSICIAN.  B12 injection today. Port flush today. Return as scheduled for injections and port flushes. Return as scheduled for office visit.   Thank you for choosing [place] at [facility] to provide your oncology and hematology care.  To afford each patient quality time with our [name], please arrive at least 15 minutes before your scheduled appointment time.   Beginning January 23rd 2017 lab work for the [facility] will be done in the  Main lab at [facility] on 1st floor. If you have a lab appointment with the [facility] please come in thru the  Main Entrance and check in at the main information desk  You need to re-schedule your appointment should you arrive 10 or more minutes late.  We strive to give you quality time with our providers, and arriving late affects you and other patients whose appointments are after yours.  Also, if you no show three or more times for appointments you may be dismissed from the clinic at the providers discretion.     Again, thank you for choosing [facility].  Our hope is that these requests will decrease the amount of time that you wait before being seen by our physicians.       _____________________________________________________________  Should you have questions after your visit to [facility], please contact our office at [phone] between the hours of 8:30 a.m. and 4:30 p.m.  Voicemails left after 4:30 p.m. will not be returned until the following business day.  For prescription refill requests, have your pharmacy contact our office.         Resources For Cancer Patients and their Caregivers ? American Cancer Society: Can assist with transportation, wigs, general needs, runs Look Good Feel Better.         (340)529-4149 ? Cancer Care: Provides financial assistance, online support groups, medication/co-pay assistance.  1-800-813-HOPE 3187813749) ? [facility] Assists [place] cancer patients and their families through emotional , educational and financial support.  334-491-8934 ? [place] DSS Where to apply for food stamps, Medicaid and utility assistance. (986) 463-2017 ? RCATS: Transportation to medical appointments. 367-674-6221 ? Social Security Administration: May apply for disability if have a Stage IV cancer. 978-745-6106 208-174-1959 ? [employer], Disability and Transit Services: Assists with nutrition, care and transit needs. [place] Support Programs: $RemoveBeforeDEI'@10RELATIVEDAYS'xPInhWjfqBjmdMGr$ @ > Cancer Support Group  2nd Tuesday of the month 1pm-2pm, Journey Room  > Creative Journey  3rd Tuesday of the month 1130am-1pm, Journey Room  > Look Good Feel Better  1st Wednesday of the month 10am-12 noon, Journey Room (Call [place] to register [phone])

## 2016-06-18 ENCOUNTER — Ambulatory Visit (HOSPITAL_COMMUNITY): Payer: Medicare HMO | Admitting: Hematology & Oncology

## 2016-07-12 DIAGNOSIS — E113391 Type 2 diabetes mellitus with moderate nonproliferative diabetic retinopathy without macular edema, right eye: Secondary | ICD-10-CM | POA: Diagnosis not present

## 2016-07-12 DIAGNOSIS — M810 Age-related osteoporosis without current pathological fracture: Secondary | ICD-10-CM | POA: Diagnosis not present

## 2016-07-12 DIAGNOSIS — G894 Chronic pain syndrome: Secondary | ICD-10-CM | POA: Diagnosis not present

## 2016-07-17 ENCOUNTER — Ambulatory Visit (HOSPITAL_COMMUNITY): Payer: Medicare HMO | Admitting: Hematology & Oncology

## 2016-07-17 ENCOUNTER — Ambulatory Visit (HOSPITAL_COMMUNITY): Payer: Self-pay

## 2016-07-18 ENCOUNTER — Other Ambulatory Visit (HOSPITAL_COMMUNITY): Payer: Self-pay | Admitting: Oncology

## 2016-07-18 ENCOUNTER — Encounter (HOSPITAL_COMMUNITY): Payer: Medicare HMO

## 2016-07-18 ENCOUNTER — Encounter (HOSPITAL_BASED_OUTPATIENT_CLINIC_OR_DEPARTMENT_OTHER): Payer: Medicare HMO

## 2016-07-18 ENCOUNTER — Encounter (HOSPITAL_COMMUNITY): Payer: Medicare HMO | Attending: Oncology | Admitting: Oncology

## 2016-07-18 ENCOUNTER — Encounter (HOSPITAL_COMMUNITY): Payer: Self-pay | Admitting: Oncology

## 2016-07-18 VITALS — HR 65 | Temp 97.6°F | Resp 18 | Wt 234.0 lb

## 2016-07-18 DIAGNOSIS — Z853 Personal history of malignant neoplasm of breast: Secondary | ICD-10-CM

## 2016-07-18 DIAGNOSIS — Z1231 Encounter for screening mammogram for malignant neoplasm of breast: Secondary | ICD-10-CM

## 2016-07-18 DIAGNOSIS — C50911 Malignant neoplasm of unspecified site of right female breast: Secondary | ICD-10-CM

## 2016-07-18 DIAGNOSIS — E538 Deficiency of other specified B group vitamins: Secondary | ICD-10-CM

## 2016-07-18 DIAGNOSIS — D649 Anemia, unspecified: Secondary | ICD-10-CM | POA: Insufficient documentation

## 2016-07-18 DIAGNOSIS — D519 Vitamin B12 deficiency anemia, unspecified: Secondary | ICD-10-CM

## 2016-07-18 LAB — COMPREHENSIVE METABOLIC PANEL
ALBUMIN: 3.4 g/dL — AB (ref 3.5–5.0)
ALK PHOS: 35 U/L — AB (ref 38–126)
ALT: 12 U/L — AB (ref 14–54)
AST: 16 U/L (ref 15–41)
Anion gap: 3 — ABNORMAL LOW (ref 5–15)
BILIRUBIN TOTAL: 0.3 mg/dL (ref 0.3–1.2)
BUN: 36 mg/dL — AB (ref 6–20)
CALCIUM: 8.6 mg/dL — AB (ref 8.9–10.3)
CO2: 28 mmol/L (ref 22–32)
CREATININE: 1.55 mg/dL — AB (ref 0.44–1.00)
Chloride: 100 mmol/L — ABNORMAL LOW (ref 101–111)
GFR calc Af Amer: 35 mL/min — ABNORMAL LOW (ref >60)
GFR calc non Af Amer: 30 mL/min — ABNORMAL LOW (ref >60)
GLUCOSE: 462 mg/dL — AB (ref 65–99)
Potassium: 4.2 mmol/L (ref 3.5–5.1)
Sodium: 131 mmol/L — ABNORMAL LOW (ref 135–145)
TOTAL PROTEIN: 6.7 g/dL (ref 6.5–8.1)

## 2016-07-18 LAB — CBC WITH DIFFERENTIAL/PLATELET
BASOS ABS: 0.1 10*3/uL (ref 0.0–0.1)
BASOS PCT: 1 %
Eosinophils Absolute: 0.2 10*3/uL (ref 0.0–0.7)
Eosinophils Relative: 3 %
HEMATOCRIT: 33.9 % — AB (ref 36.0–46.0)
HEMOGLOBIN: 10.9 g/dL — AB (ref 12.0–15.0)
LYMPHS ABS: 1.7 10*3/uL (ref 0.7–4.0)
Lymphocytes Relative: 25 %
MCH: 27.9 pg (ref 26.0–34.0)
MCHC: 32.2 g/dL (ref 30.0–36.0)
MCV: 86.9 fL (ref 78.0–100.0)
MONOS PCT: 5 %
Monocytes Absolute: 0.3 10*3/uL (ref 0.1–1.0)
NEUTROS PCT: 66 %
Neutro Abs: 4.6 10*3/uL (ref 1.7–7.7)
Platelets: 267 10*3/uL (ref 150–400)
RBC: 3.9 MIL/uL (ref 3.87–5.11)
RDW: 13.8 % (ref 11.5–15.5)
WBC: 6.9 10*3/uL (ref 4.0–10.5)

## 2016-07-18 MED ORDER — CYANOCOBALAMIN 1000 MCG/ML IJ SOLN
1000.0000 ug | Freq: Once | INTRAMUSCULAR | Status: AC
  Administered 2016-07-18: 1000 ug via INTRAMUSCULAR
  Filled 2016-07-18: qty 1
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
## 2016-07-18 NOTE — Patient Instructions (Signed)
[place] at [facility] Discharge Instructions  RECOMMENDATIONS MADE BY THE CONSULTANT AND ANY TEST RESULTS WILL BE SENT TO YOUR REFERRING PHYSICIAN.  Exam done and seen by Dr. Oliva Bustard today. Need to have Mammogram done soon. Need to call and get appointment with Dr. Laural Golden Will set up B12 injections  Labs today Return to see Dr. Whitney Muse in 4 months  Thank you for choosing [place] at [facility] to provide your oncology and hematology care.  To afford each patient quality time with our provider, please arrive at least 15 minutes before your scheduled appointment time.   Beginning January 23rd 2017 lab work for the [facility] will be done in the  Main lab at [facility] on 1st floor. If you have a lab appointment with the [facility] please come in thru the  Main Entrance and check in at the main information desk  You need to re-schedule your appointment should you arrive 10 or more minutes late.  We strive to give you quality time with our providers, and arriving late affects you and other patients whose appointments are after yours.  Also, if you no show three or more times for appointments you may be dismissed from the clinic at the providers discretion.     Again, thank you for choosing [facility].  Our hope is that these requests will decrease the amount of time that you wait before being seen by our physicians.       _____________________________________________________________  Should you have questions after your visit to [facility], please contact our office at [phone] between the hours of 8:30 a.m. and 4:30 p.m.  Voicemails left after 4:30 p.m. will not be returned until the following business day.  For prescription refill requests, have your pharmacy contact our office.         Resources For Cancer Patients and their Caregivers ? American Cancer Society: Can assist with transportation,  wigs, general needs, runs Look Good Feel Better.        469-408-1448 ? Cancer Care: Provides financial assistance, online support groups, medication/co-pay assistance.  1-800-813-HOPE 216-267-9628) ? [place] Assists [place] cancer patients and their families through emotional , educational and financial support.  8051802828 ? [place] DSS Where to apply for food stamps, Medicaid and utility assistance. (727) 276-5447 ? RCATS: Transportation to medical appointments. 503-602-1010 ? Social Security Administration: May apply for disability if have a Stage IV cancer. 319 663 0075 530-516-7191 ? [employer], Disability and Transit Services: Assists with nutrition, care and transit needs. [place] Support Programs: $[email or web address]@10RELATIVEDAYS'MixqjHYqQvzqjNFc$ @ > Cancer Support Group  2nd Tuesday of the month 1pm-2pm, Journey Room  > Creative Journey  3rd Tuesday of the month 1130am-1pm, Journey Room  > Look Good Feel Better  1st Wednesday of the month 10am-12 noon, Journey Room (Call [place] to register [phone])

## 2016-07-18 NOTE — Progress Notes (Signed)
RAYLAN TROIANI presents today for injection per the provider's orders.  B12 administration without incident; see MAR for injection details.  Patient tolerated procedure well and without incident.  No questions or complaints noted at this time.

## 2016-07-18 NOTE — Progress Notes (Addendum)
B12 injections called into [facility] in [place]. Patient requested she has transportation issues and wanted to take PO but Dr. Oliva Bustard advised her she could do injections herself at home. Pre-filled B12 needles 1,034meq called in per order. .     July 18, 2016.   blood sugar was noticed to be 462.  Our nurse will call patient as well as inform primary care physician.  Patient would be advised to contact primary care physician for a chest in dose of diabetes medication

## 2016-07-19 ENCOUNTER — Telehealth (HOSPITAL_COMMUNITY): Payer: Self-pay

## 2016-07-19 NOTE — Telephone Encounter (Signed)
Per Dr. Oliva Bustard, patients glucose at follow up yesterday was 462. PCP, [facility], was called but had to leave a message notifying them of patients glucose. Also called patient. She stated she had not checked her blood sugar today. She does not always follow a diabetic diet and yesterday she had not had her diabetic medications. Encouraged patient to try to follow a diabetic diet and take medication as directed by PCP. Instructed patient to check her BS today and if it is still elevated to call her PCP. She verbalized understanding.

## 2016-07-24 ENCOUNTER — Encounter (INDEPENDENT_AMBULATORY_CARE_PROVIDER_SITE_OTHER): Payer: Self-pay | Admitting: Internal Medicine

## 2016-07-25 ENCOUNTER — Ambulatory Visit (HOSPITAL_COMMUNITY): Payer: Self-pay

## 2016-07-26 ENCOUNTER — Other Ambulatory Visit (HOSPITAL_COMMUNITY): Payer: Self-pay | Admitting: Oncology

## 2016-07-26 DIAGNOSIS — R921 Mammographic calcification found on diagnostic imaging of breast: Secondary | ICD-10-CM

## 2016-07-26 DIAGNOSIS — Z09 Encounter for follow-up examination after completed treatment for conditions other than malignant neoplasm: Secondary | ICD-10-CM

## 2016-08-01 ENCOUNTER — Ambulatory Visit (HOSPITAL_COMMUNITY): Payer: Self-pay

## 2016-08-01 DIAGNOSIS — G894 Chronic pain syndrome: Secondary | ICD-10-CM | POA: Diagnosis not present

## 2016-08-01 DIAGNOSIS — Z6838 Body mass index (BMI) 38.0-38.9, adult: Secondary | ICD-10-CM | POA: Diagnosis not present

## 2016-08-02 ENCOUNTER — Ambulatory Visit (INDEPENDENT_AMBULATORY_CARE_PROVIDER_SITE_OTHER): Payer: Self-pay | Admitting: Internal Medicine

## 2016-08-04 NOTE — Progress Notes (Signed)
This encounter was created in error - please disregard.  This encounter was created in error - please disregard.

## 2016-08-12 DIAGNOSIS — G894 Chronic pain syndrome: Secondary | ICD-10-CM | POA: Diagnosis not present

## 2016-08-12 DIAGNOSIS — M810 Age-related osteoporosis without current pathological fracture: Secondary | ICD-10-CM | POA: Diagnosis not present

## 2016-08-12 DIAGNOSIS — E113391 Type 2 diabetes mellitus with moderate nonproliferative diabetic retinopathy without macular edema, right eye: Secondary | ICD-10-CM | POA: Diagnosis not present

## 2016-08-17 ENCOUNTER — Encounter (HOSPITAL_COMMUNITY): Payer: Self-pay

## 2016-08-17 ENCOUNTER — Ambulatory Visit (HOSPITAL_COMMUNITY): Payer: Self-pay

## 2016-08-17 ENCOUNTER — Encounter (HOSPITAL_COMMUNITY): Payer: Medicare HMO | Attending: Oncology

## 2016-08-17 DIAGNOSIS — Z853 Personal history of malignant neoplasm of breast: Secondary | ICD-10-CM | POA: Diagnosis not present

## 2016-08-17 DIAGNOSIS — D649 Anemia, unspecified: Secondary | ICD-10-CM | POA: Insufficient documentation

## 2016-08-17 DIAGNOSIS — Z452 Encounter for adjustment and management of vascular access device: Secondary | ICD-10-CM

## 2016-08-17 DIAGNOSIS — C50911 Malignant neoplasm of unspecified site of right female breast: Secondary | ICD-10-CM | POA: Insufficient documentation

## 2016-08-17 MED ORDER — HEPARIN SOD (PORK) LOCK FLUSH 100 UNIT/ML IV SOLN
500.0000 [IU] | Freq: Once | INTRAVENOUS | Status: AC
  Administered 2016-08-17: 500 [IU] via INTRAVENOUS
  Filled 2016-08-17: qty 5

## 2016-08-17 MED ORDER — SODIUM CHLORIDE 0.9% FLUSH
10.0000 mL | INTRAVENOUS | Status: DC | PRN
  Administered 2016-08-17: 10 mL via INTRAVENOUS
  Filled 2016-08-17: qty 10

## 2016-08-17 NOTE — Patient Instructions (Signed)
[place] at [facility] Discharge Instructions  RECOMMENDATIONS MADE BY THE CONSULTANT AND ANY TEST RESULTS WILL BE SENT TO YOUR REFERRING PHYSICIAN.  You had your port flushed today. Return as scheduled.    Thank you for choosing [place] at [facility] to provide your oncology and hematology care.  To afford each patient quality time with our provider, please arrive at least 15 minutes before your scheduled appointment time.   Beginning January 23rd 2017 lab work for the [facility] will be done in the  Main lab at [facility] on 1st floor. If you have a lab appointment with the [facility] please come in thru the  Main Entrance and check in at the main information desk  You need to re-schedule your appointment should you arrive 10 or more minutes late.  We strive to give you quality time with our providers, and arriving late affects you and other patients whose appointments are after yours.  Also, if you no show three or more times for appointments you may be dismissed from the clinic at the providers discretion.     Again, thank you for choosing [facility].  Our hope is that these requests will decrease the amount of time that you wait before being seen by our physicians.       _____________________________________________________________  Should you have questions after your visit to [facility], please contact our office at [phone] between the hours of 8:30 a.m. and 4:30 p.m.  Voicemails left after 4:30 p.m. will not be returned until the following business day.  For prescription refill requests, have your pharmacy contact our office.         Resources For Cancer Patients and their Caregivers ? American Cancer Society: Can assist with transportation, wigs, general needs, runs Look Good Feel Better.        617-287-0147 ? Cancer Care: Provides financial assistance, online support groups,  medication/co-pay assistance.  1-800-813-HOPE 9395612372) ? [place] Assists [place] cancer patients and their families through emotional , educational and financial support.  309-227-5195 ? [place] DSS Where to apply for food stamps, Medicaid and utility assistance. (928)711-2600 ? RCATS: Transportation to medical appointments. 641-742-5467 ? Social Security Administration: May apply for disability if have a Stage IV cancer. 818-134-0007 (608)503-3637 ? [employer], Disability and Transit Services: Assists with nutrition, care and transit needs. [place] Support Programs: $[email or web address]@[email or web address]'PMpTQdpffOfBXoMv$ @ > Cancer Support Group  2nd Tuesday of the month 1pm-2pm, Journey Room  > Creative Journey  3rd Tuesday of the month 1130am-1pm, Journey Room  > Look Good Feel Better  1st Wednesday of the month 10am-12 noon, Journey Room (Call [place] to register [phone])

## 2016-08-17 NOTE — Progress Notes (Signed)
[name] presented for Portacath access and flush.Portacath located left chest wall accessed with  H 20 needle. No blood return and Pt states they never get any blood return.  Portacath flushed with 65ml NS and 500U/36ml Heparin and needle removed intact. Procedure without incident. Patient tolerated procedure well. Pt stable and discharged home. Pt to return as scheduled.

## 2016-08-28 DIAGNOSIS — G894 Chronic pain syndrome: Secondary | ICD-10-CM | POA: Diagnosis not present

## 2016-08-28 DIAGNOSIS — I10 Essential (primary) hypertension: Secondary | ICD-10-CM | POA: Diagnosis not present

## 2016-08-28 DIAGNOSIS — Z6838 Body mass index (BMI) 38.0-38.9, adult: Secondary | ICD-10-CM | POA: Diagnosis not present

## 2016-08-28 DIAGNOSIS — E1142 Type 2 diabetes mellitus with diabetic polyneuropathy: Secondary | ICD-10-CM | POA: Diagnosis not present

## 2016-08-28 DIAGNOSIS — R69 Illness, unspecified: Secondary | ICD-10-CM | POA: Diagnosis not present

## 2016-08-28 DIAGNOSIS — Z23 Encounter for immunization: Secondary | ICD-10-CM | POA: Diagnosis not present

## 2016-08-28 DIAGNOSIS — Z0001 Encounter for general adult medical examination with abnormal findings: Secondary | ICD-10-CM | POA: Diagnosis not present

## 2016-08-28 DIAGNOSIS — K219 Gastro-esophageal reflux disease without esophagitis: Secondary | ICD-10-CM | POA: Diagnosis not present

## 2016-08-28 DIAGNOSIS — E1140 Type 2 diabetes mellitus with diabetic neuropathy, unspecified: Secondary | ICD-10-CM | POA: Diagnosis not present

## 2016-08-28 DIAGNOSIS — N184 Chronic kidney disease, stage 4 (severe): Secondary | ICD-10-CM | POA: Diagnosis not present

## 2016-08-29 ENCOUNTER — Other Ambulatory Visit (HOSPITAL_COMMUNITY): Payer: Self-pay | Admitting: Internal Medicine

## 2016-08-29 DIAGNOSIS — Z1231 Encounter for screening mammogram for malignant neoplasm of breast: Secondary | ICD-10-CM

## 2016-08-29 DIAGNOSIS — C50911 Malignant neoplasm of unspecified site of right female breast: Secondary | ICD-10-CM | POA: Diagnosis not present

## 2016-08-30 DIAGNOSIS — R69 Illness, unspecified: Secondary | ICD-10-CM | POA: Diagnosis not present

## 2016-09-05 ENCOUNTER — Ambulatory Visit (HOSPITAL_COMMUNITY)
Admission: RE | Admit: 2016-09-05 | Discharge: 2016-09-05 | Disposition: A | Discharge status: Home / Self Care | Payer: Medicare HMO | Source: Ambulatory Visit | Attending: Internal Medicine | Admitting: Internal Medicine

## 2016-09-05 DIAGNOSIS — Z1231 Encounter for screening mammogram for malignant neoplasm of breast: Secondary | ICD-10-CM | POA: Insufficient documentation

## 2016-09-11 DIAGNOSIS — E113391 Type 2 diabetes mellitus with moderate nonproliferative diabetic retinopathy without macular edema, right eye: Secondary | ICD-10-CM | POA: Diagnosis not present

## 2016-09-11 DIAGNOSIS — G894 Chronic pain syndrome: Secondary | ICD-10-CM | POA: Diagnosis not present

## 2016-09-11 DIAGNOSIS — M810 Age-related osteoporosis without current pathological fracture: Secondary | ICD-10-CM | POA: Diagnosis not present

## 2016-09-20 ENCOUNTER — Other Ambulatory Visit (HOSPITAL_COMMUNITY): Payer: Self-pay | Admitting: Oncology

## 2016-09-20 DIAGNOSIS — E538 Deficiency of other specified B group vitamins: Secondary | ICD-10-CM

## 2016-09-20 MED ORDER — CYANOCOBALAMIN 1000 MCG/ML IJ SOLN: 1000.0000 ug | Freq: Once | INTRAMUSCULAR | 5 refills | Status: AC

## 2016-10-12 ENCOUNTER — Encounter (HOSPITAL_COMMUNITY): Payer: Self-pay

## 2016-10-12 DIAGNOSIS — M810 Age-related osteoporosis without current pathological fracture: Secondary | ICD-10-CM | POA: Diagnosis not present

## 2016-10-12 DIAGNOSIS — E113391 Type 2 diabetes mellitus with moderate nonproliferative diabetic retinopathy without macular edema, right eye: Secondary | ICD-10-CM | POA: Diagnosis not present

## 2016-10-12 DIAGNOSIS — G894 Chronic pain syndrome: Secondary | ICD-10-CM | POA: Diagnosis not present

## 2016-10-23 ENCOUNTER — Encounter (HOSPITAL_COMMUNITY): Payer: Self-pay

## 2016-11-07 ENCOUNTER — Other Ambulatory Visit: Payer: Self-pay | Admitting: "Endocrinology

## 2016-11-08 ENCOUNTER — Encounter (HOSPITAL_COMMUNITY): Payer: Self-pay

## 2016-11-10 ENCOUNTER — Inpatient Hospital Stay (HOSPITAL_COMMUNITY)
Admission: EM | Admit: 2016-11-10 | Discharge: 2016-11-11 | DRG: 392 | Disposition: A | Discharge status: Home Health | Payer: MEDICARE | Attending: Nephrology | Admitting: Nephrology

## 2016-11-10 ENCOUNTER — Encounter (HOSPITAL_COMMUNITY): Payer: Self-pay

## 2016-11-10 ENCOUNTER — Emergency Department (HOSPITAL_COMMUNITY): Payer: MEDICARE

## 2016-11-10 DIAGNOSIS — E785 Hyperlipidemia, unspecified: Secondary | ICD-10-CM | POA: Diagnosis present

## 2016-11-10 DIAGNOSIS — I4891 Unspecified atrial fibrillation: Secondary | ICD-10-CM | POA: Diagnosis not present

## 2016-11-10 DIAGNOSIS — E1122 Type 2 diabetes mellitus with diabetic chronic kidney disease: Secondary | ICD-10-CM | POA: Diagnosis present

## 2016-11-10 DIAGNOSIS — Z6832 Body mass index (BMI) 32.0-32.9, adult: Secondary | ICD-10-CM

## 2016-11-10 DIAGNOSIS — Z853 Personal history of malignant neoplasm of breast: Secondary | ICD-10-CM

## 2016-11-10 DIAGNOSIS — Z7982 Long term (current) use of aspirin: Secondary | ICD-10-CM | POA: Diagnosis not present

## 2016-11-10 DIAGNOSIS — Z794 Long term (current) use of insulin: Secondary | ICD-10-CM | POA: Diagnosis not present

## 2016-11-10 DIAGNOSIS — D649 Anemia, unspecified: Secondary | ICD-10-CM | POA: Diagnosis not present

## 2016-11-10 DIAGNOSIS — N3000 Acute cystitis without hematuria: Secondary | ICD-10-CM | POA: Diagnosis not present

## 2016-11-10 DIAGNOSIS — E669 Obesity, unspecified: Secondary | ICD-10-CM | POA: Diagnosis not present

## 2016-11-10 DIAGNOSIS — E1165 Type 2 diabetes mellitus with hyperglycemia: Secondary | ICD-10-CM | POA: Diagnosis present

## 2016-11-10 DIAGNOSIS — R748 Abnormal levels of other serum enzymes: Secondary | ICD-10-CM | POA: Diagnosis not present

## 2016-11-10 DIAGNOSIS — M797 Fibromyalgia: Secondary | ICD-10-CM | POA: Diagnosis not present

## 2016-11-10 DIAGNOSIS — I129 Hypertensive chronic kidney disease with stage 1 through stage 4 chronic kidney disease, or unspecified chronic kidney disease: Secondary | ICD-10-CM

## 2016-11-10 DIAGNOSIS — M1990 Unspecified osteoarthritis, unspecified site: Secondary | ICD-10-CM | POA: Diagnosis present

## 2016-11-10 DIAGNOSIS — M810 Age-related osteoporosis without current pathological fracture: Secondary | ICD-10-CM | POA: Diagnosis not present

## 2016-11-10 DIAGNOSIS — N200 Calculus of kidney: Secondary | ICD-10-CM | POA: Diagnosis not present

## 2016-11-10 DIAGNOSIS — Z87891 Personal history of nicotine dependence: Secondary | ICD-10-CM

## 2016-11-10 DIAGNOSIS — Z91048 Other nonmedicinal substance allergy status: Secondary | ICD-10-CM

## 2016-11-10 DIAGNOSIS — J45909 Unspecified asthma, uncomplicated: Secondary | ICD-10-CM | POA: Diagnosis present

## 2016-11-10 DIAGNOSIS — I10 Essential (primary) hypertension: Secondary | ICD-10-CM | POA: Diagnosis not present

## 2016-11-10 DIAGNOSIS — E113391 Type 2 diabetes mellitus with moderate nonproliferative diabetic retinopathy without macular edema, right eye: Secondary | ICD-10-CM | POA: Diagnosis not present

## 2016-11-10 DIAGNOSIS — F329 Major depressive disorder, single episode, unspecified: Secondary | ICD-10-CM | POA: Diagnosis present

## 2016-11-10 DIAGNOSIS — R778 Other specified abnormalities of plasma proteins: Secondary | ICD-10-CM | POA: Diagnosis present

## 2016-11-10 DIAGNOSIS — Z885 Allergy status to narcotic agent status: Secondary | ICD-10-CM

## 2016-11-10 DIAGNOSIS — E11319 Type 2 diabetes mellitus with unspecified diabetic retinopathy without macular edema: Secondary | ICD-10-CM | POA: Diagnosis present

## 2016-11-10 DIAGNOSIS — R739 Hyperglycemia, unspecified: Secondary | ICD-10-CM

## 2016-11-10 DIAGNOSIS — R197 Diarrhea, unspecified: Secondary | ICD-10-CM | POA: Diagnosis present

## 2016-11-10 DIAGNOSIS — K529 Noninfective gastroenteritis and colitis, unspecified: Principal | ICD-10-CM | POA: Diagnosis present

## 2016-11-10 DIAGNOSIS — G894 Chronic pain syndrome: Secondary | ICD-10-CM | POA: Diagnosis not present

## 2016-11-10 DIAGNOSIS — I679 Cerebrovascular disease, unspecified: Secondary | ICD-10-CM | POA: Diagnosis present

## 2016-11-10 DIAGNOSIS — N184 Chronic kidney disease, stage 4 (severe): Secondary | ICD-10-CM | POA: Diagnosis present

## 2016-11-10 DIAGNOSIS — R1032 Left lower quadrant pain: Secondary | ICD-10-CM

## 2016-11-10 DIAGNOSIS — I482 Chronic atrial fibrillation: Secondary | ICD-10-CM | POA: Diagnosis not present

## 2016-11-10 DIAGNOSIS — N390 Urinary tract infection, site not specified: Secondary | ICD-10-CM | POA: Diagnosis present

## 2016-11-10 DIAGNOSIS — R7989 Other specified abnormal findings of blood chemistry: Secondary | ICD-10-CM | POA: Diagnosis present

## 2016-11-10 LAB — CBC WITH DIFFERENTIAL/PLATELET
BASOS ABS: 0 10*3/uL (ref 0.0–0.1)
BASOS PCT: 0 %
EOS PCT: 0 %
Eosinophils Absolute: 0 10*3/uL (ref 0.0–0.7)
HEMATOCRIT: 33.9 % — AB (ref 36.0–46.0)
Hemoglobin: 11.4 g/dL — ABNORMAL LOW (ref 12.0–15.0)
LYMPHS PCT: 11 %
Lymphs Abs: 1.1 10*3/uL (ref 0.7–4.0)
MCH: 27.8 pg (ref 26.0–34.0)
MCHC: 33.6 g/dL (ref 30.0–36.0)
MCV: 82.7 fL (ref 78.0–100.0)
Monocytes Absolute: 0.7 10*3/uL (ref 0.1–1.0)
Monocytes Relative: 7 %
NEUTROS ABS: 7.7 10*3/uL (ref 1.7–7.7)
Neutrophils Relative %: 82 %
PLATELETS: 301 10*3/uL (ref 150–400)
RBC: 4.1 MIL/uL (ref 3.87–5.11)
RDW: 13 % (ref 11.5–15.5)
WBC: 9.5 10*3/uL (ref 4.0–10.5)

## 2016-11-10 LAB — COMPREHENSIVE METABOLIC PANEL
ALBUMIN: 2.9 g/dL — AB (ref 3.5–5.0)
ALT: 11 U/L — AB (ref 14–54)
AST: 13 U/L — AB (ref 15–41)
Alkaline Phosphatase: 61 U/L (ref 38–126)
Anion gap: 12 (ref 5–15)
BUN: 41 mg/dL — AB (ref 6–20)
CHLORIDE: 92 mmol/L — AB (ref 101–111)
CO2: 24 mmol/L (ref 22–32)
CREATININE: 1.76 mg/dL — AB (ref 0.44–1.00)
Calcium: 9.8 mg/dL (ref 8.9–10.3)
GFR calc Af Amer: 30 mL/min — ABNORMAL LOW (ref >60)
GFR, EST NON AFRICAN AMERICAN: 26 mL/min — AB (ref >60)
GLUCOSE: 559 mg/dL — AB (ref 65–99)
POTASSIUM: 4.1 mmol/L (ref 3.5–5.1)
Sodium: 128 mmol/L — ABNORMAL LOW (ref 135–145)
Total Bilirubin: 0.7 mg/dL (ref 0.3–1.2)
Total Protein: 7 g/dL (ref 6.5–8.1)

## 2016-11-10 LAB — C DIFFICILE QUICK SCREEN W PCR REFLEX
C Diff antigen: NEGATIVE
C Diff interpretation: NOT DETECTED
C Diff toxin: NEGATIVE

## 2016-11-10 LAB — CBG MONITORING, ED
GLUCOSE-CAPILLARY: 510 mg/dL — AB (ref 65–99)
Glucose-Capillary: 417 mg/dL — ABNORMAL HIGH (ref 65–99)
Glucose-Capillary: 394 mg/dL — ABNORMAL HIGH (ref 65–99)

## 2016-11-10 LAB — BASIC METABOLIC PANEL
Anion gap: 12 (ref 5–15)
BUN: 39 mg/dL — ABNORMAL HIGH (ref 6–20)
CHLORIDE: 92 mmol/L — AB (ref 101–111)
CO2: 26 mmol/L (ref 22–32)
Calcium: 9.8 mg/dL (ref 8.9–10.3)
Creatinine, Ser: 1.71 mg/dL — ABNORMAL HIGH (ref 0.44–1.00)
GFR calc non Af Amer: 27 mL/min — ABNORMAL LOW (ref >60)
GFR, EST AFRICAN AMERICAN: 31 mL/min — AB (ref >60)
Glucose, Bld: 426 mg/dL — ABNORMAL HIGH (ref 65–99)
POTASSIUM: 3.6 mmol/L (ref 3.5–5.1)
SODIUM: 130 mmol/L — AB (ref 135–145)

## 2016-11-10 LAB — URINALYSIS, ROUTINE W REFLEX MICROSCOPIC
Bilirubin Urine: NEGATIVE
Glucose, UA: >=500 mg/dL — AB
Ketones, ur: NEGATIVE mg/dL
Leukocytes, UA: NEGATIVE
Nitrite: POSITIVE — AB
PH: 5 (ref 5.0–8.0)
Protein, ur: 100 mg/dL — AB
SPECIFIC GRAVITY, URINE: 1.023 (ref 1.005–1.030)

## 2016-11-10 LAB — GLUCOSE, CAPILLARY: GLUCOSE-CAPILLARY: 341 mg/dL — AB (ref 65–99)

## 2016-11-10 LAB — TROPONIN I: Troponin I: 0.04 ng/mL (ref <0.03)

## 2016-11-10 LAB — LIPASE, BLOOD: LIPASE: 23 U/L (ref 11–51)

## 2016-11-10 MED ORDER — ROSUVASTATIN CALCIUM 20 MG PO TABS
40.0000 mg | ORAL_TABLET | Freq: Every day | ORAL | Status: DC
  Administered 2016-11-10: 40 mg via ORAL
  Filled 2016-11-10: qty 2

## 2016-11-10 MED ORDER — ALBUTEROL SULFATE (2.5 MG/3ML) 0.083% IN NEBU: 3.0000 mL | INHALATION_SOLUTION | Freq: Four times a day (QID) | RESPIRATORY_TRACT | Status: DC | PRN

## 2016-11-10 MED ORDER — SODIUM CHLORIDE 0.9 % IV BOLUS (SEPSIS)
500.0000 mL | Freq: Once | INTRAVENOUS | Status: AC
  Administered 2016-11-10: 500 mL via INTRAVENOUS

## 2016-11-10 MED ORDER — INSULIN DETEMIR 100 UNIT/ML ~~LOC~~ SOLN
16.0000 [IU] | Freq: Every day | SUBCUTANEOUS | Status: DC
  Administered 2016-11-10: 16 [IU] via SUBCUTANEOUS
  Filled 2016-11-10 (×2): qty 0.16

## 2016-11-10 MED ORDER — GLIPIZIDE 5 MG PO TABS
10.0000 mg | ORAL_TABLET | Freq: Two times a day (BID) | ORAL | Status: DC
  Administered 2016-11-11: 10 mg via ORAL
  Filled 2016-11-10: qty 2

## 2016-11-10 MED ORDER — METRONIDAZOLE 500 MG PO TABS
500.0000 mg | ORAL_TABLET | Freq: Three times a day (TID) | ORAL | Status: DC
  Administered 2016-11-10 – 2016-11-11 (×3): 500 mg via ORAL
  Filled 2016-11-10 (×3): qty 1

## 2016-11-10 MED ORDER — ASPIRIN EC 81 MG PO TBEC
81.0000 mg | DELAYED_RELEASE_TABLET | Freq: Every day | ORAL | Status: DC
  Administered 2016-11-11: 81 mg via ORAL
  Filled 2016-11-10: qty 1

## 2016-11-10 MED ORDER — DEXTROSE 5 % IV SOLN
1.0000 g | Freq: Once | INTRAVENOUS | Status: AC
  Administered 2016-11-10: 1 g via INTRAVENOUS
  Filled 2016-11-10: qty 10

## 2016-11-10 MED ORDER — LISINOPRIL 10 MG PO TABS
20.0000 mg | ORAL_TABLET | Freq: Every day | ORAL | Status: DC
  Administered 2016-11-11: 20 mg via ORAL
  Filled 2016-11-10: qty 2

## 2016-11-10 MED ORDER — LISINOPRIL-HYDROCHLOROTHIAZIDE 20-25 MG PO TABS: 1.0000 | ORAL_TABLET | Freq: Every day | ORAL | Status: DC

## 2016-11-10 MED ORDER — ENOXAPARIN SODIUM 40 MG/0.4ML ~~LOC~~ SOLN
40.0000 mg | SUBCUTANEOUS | Status: DC
  Administered 2016-11-10: 40 mg via SUBCUTANEOUS
  Filled 2016-11-10: qty 0.4

## 2016-11-10 MED ORDER — ONDANSETRON HCL 4 MG/2ML IJ SOLN: 4.0000 mg | Freq: Four times a day (QID) | INTRAMUSCULAR | Status: DC | PRN

## 2016-11-10 MED ORDER — INSULIN ASPART 100 UNIT/ML FLEXPEN: 10.0000 [IU] | PEN_INJECTOR | Freq: Three times a day (TID) | SUBCUTANEOUS | Status: DC

## 2016-11-10 MED ORDER — CIPROFLOXACIN IN D5W 400 MG/200ML IV SOLN
400.0000 mg | Freq: Two times a day (BID) | INTRAVENOUS | Status: DC
  Administered 2016-11-10 – 2016-11-11 (×2): 400 mg via INTRAVENOUS
  Filled 2016-11-10 (×2): qty 200

## 2016-11-10 MED ORDER — POTASSIUM CHLORIDE IN NACL 20-0.9 MEQ/L-% IV SOLN
INTRAVENOUS | Status: DC
  Administered 2016-11-10: 22:00:00 via INTRAVENOUS

## 2016-11-10 MED ORDER — RALOXIFENE HCL 60 MG PO TABS
60.0000 mg | ORAL_TABLET | Freq: Every day | ORAL | Status: DC
  Administered 2016-11-11: 60 mg via ORAL
  Filled 2016-11-10: qty 1

## 2016-11-10 MED ORDER — INSULIN ASPART 100 UNIT/ML ~~LOC~~ SOLN
0.0000 [IU] | Freq: Every day | SUBCUTANEOUS | Status: DC
  Administered 2016-11-10: 4 [IU] via SUBCUTANEOUS

## 2016-11-10 MED ORDER — PREGABALIN 50 MG PO CAPS
50.0000 mg | ORAL_CAPSULE | Freq: Three times a day (TID) | ORAL | Status: DC
  Administered 2016-11-10 – 2016-11-11 (×3): 50 mg via ORAL
  Filled 2016-11-10 (×3): qty 1

## 2016-11-10 MED ORDER — PANTOPRAZOLE SODIUM 40 MG PO TBEC
80.0000 mg | DELAYED_RELEASE_TABLET | Freq: Every day | ORAL | Status: DC
  Administered 2016-11-11: 80 mg via ORAL
  Filled 2016-11-10: qty 2

## 2016-11-10 MED ORDER — ONDANSETRON HCL 4 MG PO TABS: 4.0000 mg | ORAL_TABLET | Freq: Four times a day (QID) | ORAL | Status: DC | PRN

## 2016-11-10 MED ORDER — SODIUM CHLORIDE 0.9 % IV BOLUS (SEPSIS): 500.0000 mL | Freq: Once | INTRAVENOUS | Status: DC

## 2016-11-10 MED ORDER — LOPERAMIDE HCL 2 MG PO CAPS: 4.0000 mg | ORAL_CAPSULE | ORAL | Status: DC | PRN

## 2016-11-10 MED ORDER — INSULIN ASPART 100 UNIT/ML IV SOLN
10.0000 [IU] | Freq: Once | INTRAVENOUS | Status: AC
  Administered 2016-11-10: 10 [IU] via INTRAVENOUS

## 2016-11-10 MED ORDER — PAROXETINE HCL 20 MG PO TABS
40.0000 mg | ORAL_TABLET | Freq: Every day | ORAL | Status: DC
  Administered 2016-11-11: 40 mg via ORAL
  Filled 2016-11-10: qty 2

## 2016-11-10 MED ORDER — FENOFIBRATE 160 MG PO TABS
160.0000 mg | ORAL_TABLET | Freq: Every day | ORAL | Status: DC
  Administered 2016-11-11: 160 mg via ORAL
  Filled 2016-11-10: qty 1

## 2016-11-10 MED ORDER — HYDROCHLOROTHIAZIDE 25 MG PO TABS: 25.0000 mg | ORAL_TABLET | Freq: Every day | ORAL | Status: DC

## 2016-11-10 MED ORDER — HYDROCODONE-ACETAMINOPHEN 10-325 MG PO TABS
1.0000 | ORAL_TABLET | ORAL | Status: DC | PRN
  Administered 2016-11-10 – 2016-11-11 (×4): 1 via ORAL
  Filled 2016-11-10 (×4): qty 1

## 2016-11-10 MED ORDER — IOPAMIDOL (ISOVUE-300) INJECTION 61%
INTRAVENOUS | Status: AC
  Filled 2016-11-10: qty 30

## 2016-11-10 MED ORDER — SODIUM CHLORIDE 0.9 % IV SOLN: INTRAVENOUS | Status: DC

## 2016-11-10 MED ORDER — INSULIN DEGLUDEC 200 UNIT/ML ~~LOC~~ SOPN: 16.0000 [IU] | PEN_INJECTOR | Freq: Every day | SUBCUTANEOUS | Status: DC

## 2016-11-10 MED ORDER — ACETAMINOPHEN 650 MG RE SUPP: 650.0000 mg | Freq: Four times a day (QID) | RECTAL | Status: DC | PRN

## 2016-11-10 MED ORDER — INSULIN ASPART 100 UNIT/ML ~~LOC~~ SOLN
0.0000 [IU] | Freq: Three times a day (TID) | SUBCUTANEOUS | Status: DC
  Administered 2016-11-11 (×2): 15 [IU] via SUBCUTANEOUS

## 2016-11-10 MED ORDER — ACETAMINOPHEN 325 MG PO TABS: 650.0000 mg | ORAL_TABLET | Freq: Four times a day (QID) | ORAL | Status: DC | PRN

## 2016-11-10 MED ORDER — FUROSEMIDE 40 MG PO TABS
40.0000 mg | ORAL_TABLET | Freq: Two times a day (BID) | ORAL | Status: DC
  Administered 2016-11-10 – 2016-11-11 (×2): 40 mg via ORAL
  Filled 2016-11-10 (×2): qty 1

## 2016-11-10 MED ORDER — INSULIN ASPART 100 UNIT/ML ~~LOC~~ SOLN
SUBCUTANEOUS | Status: AC
  Filled 2016-11-10: qty 1

## 2016-11-10 NOTE — H&P (Addendum)
History and Physical  [name] IRC:[id number] DOB: 1933/01/23 DOA: 11/10/2016  Referring physician: Dr Lita Mains, ED physician PCP: [name]., MD  Outpatient Specialists:   Dr Justin Mend (nephrology)  Chief Complaint: abdominal pain  HPI: [name] is a 82 y.o. female with a history of atrial fibrillation CHADS 2 Vasc Score of 5, not on anticoagulation due to falls, chronic kidney disease with baseline creatinine of 1.7-2, history of breast carcinoma, history of depression, diabetes, fibromyalgia, hypertension, obesity. Patient seen with 3-4 days of increasing confusion, abdominal pain with watery diarrhea with multiple episodes throughout the day. Symptoms are worsening. Patient states that she was confused and unable to take care of herself while she was sick. She stated that she wasn't eating and unable to take her medications. She was not taking her insulin. Her son brought her to the emergency department. After receiving fluids and antibiotics, the patient is feeling a little better.  Emergency Department Course: Patient was noted to have a blood sugar of 500, UTI, CT that showed colitis. Patient received IV fluids, insulin, ceftriaxone.  Review of Systems:   Pt denies any fevers, chills, nausea, vomiting, diarrhea, constipation, abdominal pain, shortness of breath, dyspnea on exertion, orthopnea, cough, wheezing, palpitations, headache, vision changes, lightheadedness, dizziness, melena, rectal bleeding.  Review of systems are otherwise negative  Past Medical History:  Diagnosis Date  . Anemia   . Aortic stenosis    Mild  . Arthritis   . Asthma   . Atrial flutter ([place]) 2002  . B12 deficiency 06/20/2015  . B12 deficiency 06/20/2015  . Breast carcinoma ([place])   . Cerebrovascular disease    with a 51% LICA; repeat study in 10/2009-no obstructive disease; modest ASVD  . Chronic kidney disease    Creatinine-1.5 in 2010 and 2.5-3 in 2011; 1.5-10/2010;  Klebsiella  UTI-10/2010. urine protein 36 mg/dl, mildly elevated  . Decreased bone density   . Depression   . Diabetes mellitus   . Diabetic retinopathy   . DJD (degenerative joint disease) 11/14/2011  . Falls infrequently 01/2010   fracture of pelvis and left humerus  . Fibromyalgia   . Headaches, cluster   . Hyperlipidemia   . Hypertension   . IBS (irritable bowel syndrome)    diverticulosis, gastroesophageal reflux disease  . Lower back pain   . Obesity 11/14/2011   Past Surgical History:  Procedure Laterality Date  . APPENDECTOMY    . CATARACT EXTRACTION, BILATERAL     Lens implants  . CENTRAL VENOUS CATHETER TUNNELED INSERTION SINGLE LUMEN    . CHOLECYSTECTOMY    . COLONOSCOPY  2012  . EYE SURGERY  nov 2012   for diabetic retinopathy  . LUMBAR DISC SURGERY     lumbosacral spine procedure x 2  . MASTECTOMY     Right breast for carcinoma   Social History:  reports that she has quit smoking. She has never used smokeless tobacco. She reports that she does not drink alcohol or use drugs. Patient lives at home  Allergies  Allergen Reactions  . Mold Extract [Trichophyton Mentagrophyte] Other (See Comments)    Reaction:  Itchy, watery eyes/sneezing   . Morphine And Related Other (See Comments)    Reaction:  Drowsiness  . Niaspan [Niacin] Other (See Comments)    Reaction:  Flushing   . Pollen Extract Other (See Comments)    Reaction:  Itchy, watery eyes/sneezing     Family History  Problem Relation Age of Onset  . Alzheimer's disease Mother   .  Heart attack Father   . Aneurysm Sister   . Coronary artery disease Brother       Prior to Admission medications   Medication Sig Start Date End Date Taking? Authorizing Provider  albuterol (PROAIR HFA) 108 (90 BASE) MCG/ACT inhaler Inhale 2 puffs into the lungs every 6 (six) hours as needed for wheezing or shortness of breath. 06/16/14  Yes Baird Cancer, PA-C  aspirin EC 81 MG tablet Take 81 mg by mouth daily.   Yes Historical  Provider, MD  esomeprazole (NEXIUM) 40 MG capsule Take 40 mg by mouth daily.    Yes Historical Provider, MD  fenofibrate (TRICOR) 145 MG tablet Take 145 mg by mouth daily.   Yes Historical Provider, MD  furosemide (LASIX) 40 MG tablet Take 40 mg by mouth 2 (two) times daily.    Yes Historical Provider, MD  glipiZIDE (GLUCOTROL) 10 MG tablet Take 10 mg by mouth 2 (two) times daily.    Yes Historical Provider, MD  HYDROcodone-acetaminophen (NORCO) 10-325 MG per tablet Take 1 tablet by mouth every 4 (four) hours as needed for moderate pain.    Yes Historical Provider, MD  insulin aspart (NOVOLOG FLEXPEN) 100 UNIT/ML FlexPen Inject 10 Units into the skin 3 (three) times daily with meals.   Yes Historical Provider, MD  Insulin Degludec (TRESIBA FLEXTOUCH) 200 UNIT/ML SOPN Inject 16 Units into the skin at bedtime.   Yes Historical Provider, MD  lisinopril-hydrochlorothiazide (PRINZIDE,ZESTORETIC) 20-25 MG per tablet Take 1 tablet by mouth daily. 05/18/15  Yes Herminio Commons, MD  loperamide (IMODIUM) 2 MG capsule Take 4 mg by mouth as needed for diarrhea or loose stools.   Yes Historical Provider, MD  PARoxetine (PAXIL) 40 MG tablet Take 40 mg by mouth daily.    Yes Historical Provider, MD  pregabalin (LYRICA) 50 MG capsule Take 50 mg by mouth 3 (three) times daily.    Yes Historical Provider, MD  raloxifene (EVISTA) 60 MG tablet Take 60 mg by mouth daily.   Yes Historical Provider, MD  rosuvastatin (CRESTOR) 40 MG tablet Take 1 tablet (40 mg total) by mouth at bedtime. 07/20/15  Yes Herminio Commons, MD    Physical Exam: BP 171/69   Pulse 72   Temp 98.2 F (36.8 C) (Oral)   Resp 20   Ht $R'5\' 11"'et$  (1.803 m)   Wt 106.1 kg (234 lb)   SpO2 96%   BMI 32.64 kg/m   General: Elderly Caucasian female. Awake and alert and oriented x3. No acute cardiopulmonary distress.  HEENT: Normocephalic atraumatic.  Right and left ears normal in appearance.  Pupils equal, round, reactive to light. Extraocular  muscles are intact. Sclerae anicteric and noninjected.  Dry mucosal membranes. No mucosal lesions.  Neck: Neck supple without lymphadenopathy. No carotid bruits. No masses palpated.  Cardiovascular: Regular rate with normal S1-S2 sounds. No murmurs, rubs, gallops auscultated. No JVD.  Respiratory: Good respiratory effort with no wheezes, rales, rhonchi. Lungs clear to auscultation bilaterally.  No accessory muscle use. Abdomen: Obese. Soft, nontender, nondistended. Active bowel sounds. No masses or hepatosplenomegaly  Skin: No rashes, lesions, or ulcerations.  Dry, warm to touch. 2+ dorsalis pedis and radial pulses. Musculoskeletal: No calf or leg pain. All major joints not erythematous nontender.  No upper or lower joint deformation.  Good ROM.  No contractures  Psychiatric: Intact judgment and insight. Pleasant and cooperative. Neurologic: No focal neurological deficits. Strength is 5/5 and symmetric in upper and lower extremities.  Cranial nerves II through  XII are grossly intact.           Labs on Admission: I have personally reviewed following labs and imaging studies  CBC:  Recent Labs Lab 11/10/16 1432  WBC 9.5  NEUTROABS 7.7  HGB 11.4*  HCT 33.9*  MCV 82.7  PLT 595   Basic Metabolic Panel:  Recent Labs Lab 11/10/16 1432  NA 128*  K 4.1  CL 92*  CO2 24  GLUCOSE 559*  BUN 41*  CREATININE 1.76*  CALCIUM 9.8   GFR: Estimated Creatinine Clearance: 33.6 mL/min (by C-G formula based on SCr of 1.76 mg/dL (H)). Liver Function Tests:  Recent Labs Lab 11/10/16 1432  AST 13*  ALT 11*  ALKPHOS 61  BILITOT 0.7  PROT 7.0  ALBUMIN 2.9*    Recent Labs Lab 11/10/16 1441  LIPASE 23   No results for input(s): AMMONIA in the last 168 hours. Coagulation Profile: No results for input(s): INR, PROTIME in the last 168 hours. Cardiac Enzymes:  Recent Labs Lab 11/10/16 1441  TROPONINI 0.04*   BNP (last 3 results) No results for input(s): PROBNP in the last 8760  hours. HbA1C: No results for input(s): HGBA1C in the last 72 hours. CBG:  Recent Labs Lab 11/10/16 1432 11/10/16 1601 11/10/16 1800  GLUCAP 510* 417* 394*   Lipid Profile: No results for input(s): CHOL, HDL, LDLCALC, TRIG, CHOLHDL, LDLDIRECT in the last 72 hours. Thyroid Function Tests: No results for input(s): TSH, T4TOTAL, FREET4, T3FREE, THYROIDAB in the last 72 hours. Anemia Panel: No results for input(s): VITAMINB12, FOLATE, FERRITIN, TIBC, IRON, RETICCTPCT in the last 72 hours. Urine analysis:    Component Value Date/Time   COLORURINE YELLOW 11/10/2016 1541   APPEARANCEUR CLOUDY (A) 11/10/2016 1541   LABSPEC 1.023 11/10/2016 1541   PHURINE 5.0 11/10/2016 1541   GLUCOSEU >=500 (A) 11/10/2016 1541   GLUCOSEU neg 04/07/2010   HGBUR MODERATE (A) 11/10/2016 1541   BILIRUBINUR NEGATIVE 11/10/2016 1541   KETONESUR NEGATIVE 11/10/2016 1541   PROTEINUR 100 (A) 11/10/2016 1541   UROBILINOGEN 0.2 09/27/2014 1630   NITRITE POSITIVE (A) 11/10/2016 1541   LEUKOCYTESUR NEGATIVE 11/10/2016 1541   Sepsis Labs: $RemoveBefo'@[id number]'NlNhITbpUlh$ (procalcitonin:4,lacticidven:4) ) Recent Results (from the past 240 hour(s))  C difficile quick scan w PCR reflex     Status: None   Collection Time: 11/10/16  3:41 PM  Result Value Ref Range Status   C Diff antigen NEGATIVE NEGATIVE Final   C Diff toxin NEGATIVE NEGATIVE Final   C Diff interpretation No C. difficile detected.  Final     Radiological Exams on Admission: Ct Abdomen Pelvis Wo Contrast  Result Date: 11/10/2016 CLINICAL DATA:  Confusion, diarrhea, abdominal pain for 3 days. History of diabetes, breast cancer, IBS. Cholecystectomy, appendectomy. EXAM: CT ABDOMEN AND PELVIS WITHOUT CONTRAST TECHNIQUE: Multidetector CT imaging of the abdomen and pelvis was performed following the standard protocol without IV contrast. COMPARISON:  05/20/2014 FINDINGS: Lower chest: No pulmonary nodules, pleural effusions, or infiltrates. The heart is enlarged. There is  calcification of coronary vessels and mitral annulus. No pericardial effusion. Hepatobiliary: Status post cholecystectomy. Homogeneous appearance of the liver. No focal liver lesion. Pancreas: Unremarkable. No pancreatic ductal dilatation or surrounding inflammatory changes. Spleen: Normal in size without focal abnormality. Adrenals/Urinary Tract: Adrenal glands have a normal appearance. Punctate intrarenal calculi identified within the kidneys bilaterally. Within the upper pole of the left kidney there is a low-attenuation lesion consistent with cysts measuring 1.6 cm. No hydronephrosis or ureteral stone. Stomach/Bowel: Stomach and small bowel loops  are normal in appearance. Status post appendectomy. There are numerous diverticulum but no evidence for acute diverticulitis. There is diffuse thickening of the sigmoid colon. Mild thickening of the transverse, ascending, and descending colon as well. No discrete colonic mass identified. Vascular/Lymphatic: There is atherosclerotic calcification of the abdominal aorta. No aneurysm. No retroperitoneal or mesenteric adenopathy. Reproductive: Status post hysterectomy.  No adnexal mass. Other: Small paraumbilical fat containing hernia or hernias. Postoperative changes in the lower anterior abdominal wall. No free pelvic fluid. Right mastectomy. Musculoskeletal: Status post posterior fusion of the lumbar spine. Significant spondylosis of the lumbar levels. IMPRESSION: 1. Diffuse thickening of the colonic wall, particularly involving the sigmoid colon. Findings favor colitis, infectious or inflammatory. Although there is colonic diverticulosis, the findings are not consistent with acute diverticulitis. 2. Cardiomegaly and coronary artery disease. 3. Status post cholecystectomy and appendectomy, hysterectomy. 4. Nephrolithiasis. 5. Left upper pole renal cyst. 6.  Aortic atherosclerosis. 7. Small paraumbilical fat containing hernia/hernia 8. Postoperative changes in the lumbar  spine. 9. Right mastectomy. Electronically Signed   By: Nolon Nations M.D.   On: 11/10/2016 17:33    EKG: Independently reviewed. Atrial fibrillation. Old inferior infarct. LVH.  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  We also discussed care given by the ED provider.  #1 UTI  Admit  Will change patient to Cipro and Flagyl to cover UTI  Urine culture pending  Recheck CBC #2 colitis  Cipro, Flagyl  Stool cultures sent  Imodium #3 diabetes type 2 with stage IV chronic kidney disease and hypertension  Repeat BMP pending  Check BMP in the morning  CBGs before meals and daily at bedtime  Home insulin and lighting scale #4 atrial fibrillation  No anticoagulation due to falls #5 hypertension  Continue home antihypertensives #6 elevated troponin I level  This is chronically elevated and appears at her baseline. No need for repeat #7 obesity  DVT prophylaxis: Lovenox Consultants: None Code Status: Full code Family Communication: Son  Disposition Plan: Patient should be able to return home following admission   Truett Mainland, DO [facility] Pager [phone]  If 7PM-7AM, please contact night-coverage [email or web address] Password [email or web address]

## 2016-11-10 NOTE — ED Triage Notes (Signed)
Pt reports lives alone and her son came down to visit today and noticed she had some confusion and c/o diarrhea and abd pain x 3 days.  Reports her blood sugar was over 500 today.  Pt presently alert and oriented.

## 2016-11-10 NOTE — ED Notes (Signed)
CRITICAL VALUE ALERT  Critical value received:  Glucose 559  Date of notification:  11/10/16  Time of notification:  1310  Critical value read back:YES  Nurse who received alert:  Lavenia Atlas, RN  MD notified (1st page):  Dr. Lita Mains at [phone]

## 2016-11-10 NOTE — ED Provider Notes (Signed)
[facility] DEPT Provider Note   CSN: [id number] Arrival date & time: 11/10/16  1403     History   Chief Complaint Chief Complaint  Patient presents with  . Diarrhea  . Hyperglycemia    HPI BRITTISH BOLINGER is a 82 y.o. female.  HPI Patient presents with 3 days of diarrhea. Unable to say how many episodes per day she is having. Denies any nausea or vomiting. Has had intermittent abdominal pain associated with diarrhea. She also reports increased confusion. Says she's been inconsistently taking her medication. Does not believe she is having for pain medicines for several days. She has chronic low back pain which is unchanged. Denies any new urinary symptoms. No blood in stool. No fever or chills. Past Medical History:  Diagnosis Date  . Anemia   . Aortic stenosis    Mild  . Arthritis   . Asthma   . Atrial flutter ([place]) 2002  . B12 deficiency 06/20/2015  . B12 deficiency 06/20/2015  . Breast carcinoma ([place])   . Cerebrovascular disease    with a 36% LICA; repeat study in 10/2009-no obstructive disease; modest ASVD  . Chronic kidney disease    Creatinine-1.5 in 2010 and 2.5-3 in 2011; 1.5-10/2010;  Klebsiella UTI-10/2010. urine protein 36 mg/dl, mildly elevated  . Decreased bone density   . Depression   . Diabetes mellitus   . Diabetic retinopathy   . DJD (degenerative joint disease) 11/14/2011  . Falls infrequently 01/2010   fracture of pelvis and left humerus  . Fibromyalgia   . Headaches, cluster   . Hyperlipidemia   . Hypertension   . IBS (irritable bowel syndrome)    diverticulosis, gastroesophageal reflux disease  . Lower back pain   . Obesity 11/14/2011    Patient Active Problem List   Diagnosis Date Noted  . UTI (urinary tract infection) 11/10/2016  . B12 deficiency 06/20/2015  . UTI (lower urinary tract infection) 05/21/2014  . Acute on chronic renal failure ([place]) 05/21/2014  . DJD (degenerative joint disease) 11/14/2011  . Obesity 11/14/2011  .  Inflammatory carcinoma of right breast   . Anemia   . IBS (irritable bowel syndrome)   . Hypertension   . Aortic stenosis   . Chronic kidney disease   . Fibromyalgia   . Cerebrovascular disease   . Atrial flutter ([place]) 07/14/2010  . Falls infrequently 01/31/2010  . HYPERLIPIDEMIA 11/15/2009  . DIABETES MELLITUS, TYPE II 10/12/2009  . DEPRESSION/ANXIETY 10/12/2009  . SPINAL STENOSIS, LUMBAR 10/12/2009    Past Surgical History:  Procedure Laterality Date  . APPENDECTOMY    . CATARACT EXTRACTION, BILATERAL     Lens implants  . CENTRAL VENOUS CATHETER TUNNELED INSERTION SINGLE LUMEN    . CHOLECYSTECTOMY    . COLONOSCOPY  2012  . EYE SURGERY  nov 2012   for diabetic retinopathy  . LUMBAR DISC SURGERY     lumbosacral spine procedure x 2  . MASTECTOMY     Right breast for carcinoma    OB History    No data available       Home Medications    Prior to Admission medications   Medication Sig Start Date End Date Taking? Authorizing Provider  albuterol (PROAIR HFA) 108 (90 BASE) MCG/ACT inhaler Inhale 2 puffs into the lungs every 6 (six) hours as needed for wheezing or shortness of breath. 06/16/14  Yes Baird Cancer, PA-C  aspirin EC 81 MG tablet Take 81 mg by mouth daily.   Yes Historical Provider, MD  esomeprazole (NEXIUM) 40 MG capsule Take 40 mg by mouth daily.    Yes Historical Provider, MD  fenofibrate (TRICOR) 145 MG tablet Take 145 mg by mouth daily.   Yes Historical Provider, MD  furosemide (LASIX) 40 MG tablet Take 40 mg by mouth 2 (two) times daily.    Yes Historical Provider, MD  glipiZIDE (GLUCOTROL) 10 MG tablet Take 10 mg by mouth 2 (two) times daily.    Yes Historical Provider, MD  HYDROcodone-acetaminophen (NORCO) 10-325 MG per tablet Take 1 tablet by mouth every 4 (four) hours as needed for moderate pain.    Yes Historical Provider, MD  insulin aspart (NOVOLOG FLEXPEN) 100 UNIT/ML FlexPen Inject 10 Units into the skin 3 (three) times daily with meals.   Yes  Historical Provider, MD  Insulin Degludec (TRESIBA FLEXTOUCH) 200 UNIT/ML SOPN Inject 16 Units into the skin at bedtime.   Yes Historical Provider, MD  lisinopril-hydrochlorothiazide (PRINZIDE,ZESTORETIC) 20-25 MG per tablet Take 1 tablet by mouth daily. 05/18/15  Yes Herminio Commons, MD  loperamide (IMODIUM) 2 MG capsule Take 4 mg by mouth as needed for diarrhea or loose stools.   Yes Historical Provider, MD  PARoxetine (PAXIL) 40 MG tablet Take 40 mg by mouth daily.    Yes Historical Provider, MD  pregabalin (LYRICA) 50 MG capsule Take 50 mg by mouth 3 (three) times daily.    Yes Historical Provider, MD  raloxifene (EVISTA) 60 MG tablet Take 60 mg by mouth daily.   Yes Historical Provider, MD  rosuvastatin (CRESTOR) 40 MG tablet Take 1 tablet (40 mg total) by mouth at bedtime. 07/20/15  Yes Herminio Commons, MD    Family History Family History  Problem Relation Age of Onset  . Alzheimer's disease Mother   . Heart attack Father   . Aneurysm Sister   . Coronary artery disease Brother     Social History Social History  Substance Use Topics  . Smoking status: Former Research scientist (life sciences)  . Smokeless tobacco: Never Used  . Alcohol use No     Allergies   Mold extract [trichophyton mentagrophyte]; Morphine and related; Niaspan [niacin]; and Pollen extract   Review of Systems Review of Systems  Constitutional: Positive for fatigue. Negative for chills and fever.  Respiratory: Negative for cough and shortness of breath.   Cardiovascular: Negative for chest pain.  Gastrointestinal: Positive for abdominal pain and diarrhea. Negative for blood in stool, nausea and vomiting.  Genitourinary: Negative for difficulty urinating, dysuria, frequency and hematuria.  Musculoskeletal: Positive for back pain. Negative for neck pain and neck stiffness.  Skin: Negative for rash and wound.  Neurological: Positive for weakness (generalized). Negative for dizziness, light-headedness, numbness and headaches.    Psychiatric/Behavioral: Positive for confusion.  All other systems reviewed and are negative.    Physical Exam Updated Vital Signs BP 151/60   Pulse 77   Temp 98.2 F (36.8 C) (Oral)   Resp 15   Ht $R'5\' 11"'vu$  (1.803 m)   Wt 234 lb (106.1 kg)   SpO2 97%   BMI 32.64 kg/m   Physical Exam  Constitutional: She is oriented to person, place, and time. She appears well-developed and well-nourished.  HENT:  Head: Normocephalic and atraumatic.  Mouth/Throat: Oropharynx is clear and moist. No oropharyngeal exudate.  Eyes: EOM are normal. Pupils are equal, round, and reactive to light.  Neck: Normal range of motion. Neck supple. No JVD present.  Cardiovascular: Normal rate and regular rhythm.  Exam reveals no gallop and no friction rub.  No murmur heard. Pulmonary/Chest: Effort normal and breath sounds normal. No respiratory distress. She has no wheezes. She has no rales. She exhibits no tenderness.  Abdominal: Soft. Bowel sounds are normal. There is tenderness (mild generalized tenderness w/o focality). There is no rebound and no guarding.  Musculoskeletal: Normal range of motion. She exhibits tenderness. She exhibits no edema.  Patient has midline lumbar tenderness to palpation. There is a healed midline scar. No erythema or warmth or step-offs. No lower extremity swelling or asymmetry.  Neurological: She is alert and oriented to person, place, and time.  5/5 motor in all extremities. Sensation appears fully intact.  Skin: Skin is warm and dry. Capillary refill takes less than 2 seconds. No rash noted. No erythema.  Psychiatric: She has a normal mood and affect. Her behavior is normal.  Nursing note and vitals reviewed.    ED Treatments / Results  Labs (all labs ordered are listed, but only abnormal results are displayed) Labs Reviewed  CBC WITH DIFFERENTIAL/PLATELET - Abnormal; Notable for the following:       Result Value   Hemoglobin 11.4 (*)    HCT 33.9 (*)    All other  components within normal limits  COMPREHENSIVE METABOLIC PANEL - Abnormal; Notable for the following:    Sodium 128 (*)    Chloride 92 (*)    Glucose, Bld 559 (*)    BUN 41 (*)    Creatinine, Ser 1.76 (*)    Albumin 2.9 (*)    AST 13 (*)    ALT 11 (*)    GFR calc non Af Amer 26 (*)    GFR calc Af Amer 30 (*)    All other components within normal limits  URINALYSIS, ROUTINE W REFLEX MICROSCOPIC - Abnormal; Notable for the following:    APPearance CLOUDY (*)    Glucose, UA >=500 (*)    Hgb urine dipstick MODERATE (*)    Protein, ur 100 (*)    Nitrite POSITIVE (*)    Bacteria, UA MANY (*)    All other components within normal limits  TROPONIN I - Abnormal; Notable for the following:    Troponin I 0.04 (*)    All other components within normal limits  CBG MONITORING, ED - Abnormal; Notable for the following:    Glucose-Capillary 510 (*)    All other components within normal limits  CBG MONITORING, ED - Abnormal; Notable for the following:    Glucose-Capillary 417 (*)    All other components within normal limits  CBG MONITORING, ED - Abnormal; Notable for the following:    Glucose-Capillary 394 (*)    All other components within normal limits  C DIFFICILE QUICK SCREEN W PCR REFLEX  LIPASE, BLOOD  BASIC METABOLIC PANEL    EKG  EKG Interpretation None       Radiology Ct Abdomen Pelvis Wo Contrast  Result Date: 11/10/2016 CLINICAL DATA:  Confusion, diarrhea, abdominal pain for 3 days. History of diabetes, breast cancer, IBS. Cholecystectomy, appendectomy. EXAM: CT ABDOMEN AND PELVIS WITHOUT CONTRAST TECHNIQUE: Multidetector CT imaging of the abdomen and pelvis was performed following the standard protocol without IV contrast. COMPARISON:  05/20/2014 FINDINGS: Lower chest: No pulmonary nodules, pleural effusions, or infiltrates. The heart is enlarged. There is calcification of coronary vessels and mitral annulus. No pericardial effusion. Hepatobiliary: Status post  cholecystectomy. Homogeneous appearance of the liver. No focal liver lesion. Pancreas: Unremarkable. No pancreatic ductal dilatation or surrounding inflammatory changes. Spleen: Normal in size without focal abnormality. Adrenals/Urinary  Tract: Adrenal glands have a normal appearance. Punctate intrarenal calculi identified within the kidneys bilaterally. Within the upper pole of the left kidney there is a low-attenuation lesion consistent with cysts measuring 1.6 cm. No hydronephrosis or ureteral stone. Stomach/Bowel: Stomach and small bowel loops are normal in appearance. Status post appendectomy. There are numerous diverticulum but no evidence for acute diverticulitis. There is diffuse thickening of the sigmoid colon. Mild thickening of the transverse, ascending, and descending colon as well. No discrete colonic mass identified. Vascular/Lymphatic: There is atherosclerotic calcification of the abdominal aorta. No aneurysm. No retroperitoneal or mesenteric adenopathy. Reproductive: Status post hysterectomy.  No adnexal mass. Other: Small paraumbilical fat containing hernia or hernias. Postoperative changes in the lower anterior abdominal wall. No free pelvic fluid. Right mastectomy. Musculoskeletal: Status post posterior fusion of the lumbar spine. Significant spondylosis of the lumbar levels. IMPRESSION: 1. Diffuse thickening of the colonic wall, particularly involving the sigmoid colon. Findings favor colitis, infectious or inflammatory. Although there is colonic diverticulosis, the findings are not consistent with acute diverticulitis. 2. Cardiomegaly and coronary artery disease. 3. Status post cholecystectomy and appendectomy, hysterectomy. 4. Nephrolithiasis. 5. Left upper pole renal cyst. 6.  Aortic atherosclerosis. 7. Small paraumbilical fat containing hernia/hernia 8. Postoperative changes in the lumbar spine. 9. Right mastectomy. Electronically Signed   By: Nolon Nations M.D.   On: 11/10/2016 17:33     Procedures Procedures (including critical care time)  Medications Ordered in ED Medications  iopamidol (ISOVUE-300) 61 % injection (not administered)  cefTRIAXone (ROCEPHIN) 1 g in dextrose 5 % 50 mL IVPB (1 g Intravenous New Bag/Given 11/10/16 1803)  sodium chloride 0.9 % bolus 500 mL (not administered)  sodium chloride 0.9 % bolus 500 mL (0 mLs Intravenous Stopped 11/10/16 1604)  insulin aspart (novoLOG) injection 10 Units (10 Units Intravenous Given 11/10/16 1509)     Initial Impression / Assessment and Plan / ED Course  I have reviewed the triage vital signs and the nursing notes.  Pertinent labs & imaging results that were available during my care of the patient were reviewed by me and considered in my medical decision making (see chart for details).  Clinical Course   Patient's vital signs remained stable. She's been given IV fluids and insulin. Mild improvement in blood glucose. CT with evidence of colitis. Patient also appears to have UTI. Had initially been given Rocephin. We will defer further antibiotics to hospitalist. Hospitalist will admit to MedSurg bed.  Final Clinical Impressions(s) / ED Diagnoses   Final diagnoses:  Urinary tract infection without hematuria, site unspecified  Colitis  Hyperglycemia    New Prescriptions New Prescriptions   No medications on file     Julianne Rice, MD 11/10/16 1824

## 2016-11-10 NOTE — ED Notes (Signed)
Report called to Ander Purpura, RN for room 314 (med-surg) at this time. Hospitalist talking with family a this time.

## 2016-11-10 NOTE — ED Notes (Signed)
CRITICAL VALUE ALERT  Critical value received:  Troponin 0.04  Date of notification:  11/10/16  Time of notification:  1312  Critical value read back:YES  Nurse who received alert:  Lavenia Atlas, RN  MD notified (1st page):  Dr. Lita Mains at [phone]

## 2016-11-11 DIAGNOSIS — N3000 Acute cystitis without hematuria: Secondary | ICD-10-CM | POA: Diagnosis not present

## 2016-11-11 DIAGNOSIS — M810 Age-related osteoporosis without current pathological fracture: Secondary | ICD-10-CM | POA: Diagnosis not present

## 2016-11-11 DIAGNOSIS — G894 Chronic pain syndrome: Secondary | ICD-10-CM | POA: Diagnosis not present

## 2016-11-11 DIAGNOSIS — K529 Noninfective gastroenteritis and colitis, unspecified: Secondary | ICD-10-CM | POA: Diagnosis not present

## 2016-11-11 DIAGNOSIS — I10 Essential (primary) hypertension: Secondary | ICD-10-CM | POA: Diagnosis not present

## 2016-11-11 DIAGNOSIS — E113391 Type 2 diabetes mellitus with moderate nonproliferative diabetic retinopathy without macular edema, right eye: Secondary | ICD-10-CM | POA: Diagnosis not present

## 2016-11-11 LAB — BASIC METABOLIC PANEL
Anion gap: 10 (ref 5–15)
BUN: 40 mg/dL — ABNORMAL HIGH (ref 6–20)
CALCIUM: 9.4 mg/dL (ref 8.9–10.3)
CO2: 25 mmol/L (ref 22–32)
CREATININE: 1.85 mg/dL — AB (ref 0.44–1.00)
Chloride: 98 mmol/L — ABNORMAL LOW (ref 101–111)
GFR calc non Af Amer: 24 mL/min — ABNORMAL LOW (ref >60)
GFR, EST AFRICAN AMERICAN: 28 mL/min — AB (ref >60)
Glucose, Bld: 359 mg/dL — ABNORMAL HIGH (ref 65–99)
Potassium: 3.8 mmol/L (ref 3.5–5.1)
SODIUM: 133 mmol/L — AB (ref 135–145)

## 2016-11-11 LAB — CBC
HCT: 34.3 % — ABNORMAL LOW (ref 36.0–46.0)
Hemoglobin: 11.2 g/dL — ABNORMAL LOW (ref 12.0–15.0)
MCH: 27.3 pg (ref 26.0–34.0)
MCHC: 32.7 g/dL (ref 30.0–36.0)
MCV: 83.5 fL (ref 78.0–100.0)
PLATELETS: 294 10*3/uL (ref 150–400)
RBC: 4.11 MIL/uL (ref 3.87–5.11)
RDW: 13.1 % (ref 11.5–15.5)
WBC: 7.3 10*3/uL (ref 4.0–10.5)

## 2016-11-11 LAB — GLUCOSE, CAPILLARY
GLUCOSE-CAPILLARY: 339 mg/dL — AB (ref 65–99)
Glucose-Capillary: 335 mg/dL — ABNORMAL HIGH (ref 65–99)

## 2016-11-11 LAB — MRSA PCR SCREENING: MRSA BY PCR: POSITIVE — AB

## 2016-11-11 MED ORDER — CIPROFLOXACIN HCL 500 MG PO TABS: 500.0000 mg | ORAL_TABLET | Freq: Every day | ORAL | 0 refills | Status: DC

## 2016-11-11 MED ORDER — MUPIROCIN 2 % EX OINT
1.0000 "application " | TOPICAL_OINTMENT | Freq: Two times a day (BID) | CUTANEOUS | Status: DC
  Administered 2016-11-11: 1 via NASAL
  Filled 2016-11-11: qty 22

## 2016-11-11 MED ORDER — METRONIDAZOLE 500 MG PO TABS: 500.0000 mg | ORAL_TABLET | Freq: Three times a day (TID) | ORAL | 0 refills | Status: AC

## 2016-11-11 MED ORDER — CHLORHEXIDINE GLUCONATE CLOTH 2 % EX PADS
6.0000 | MEDICATED_PAD | Freq: Every day | CUTANEOUS | Status: DC
  Administered 2016-11-11: 6 via TOPICAL

## 2016-11-11 MED ORDER — LISINOPRIL 20 MG PO TABS: 20.0000 mg | ORAL_TABLET | Freq: Every day | ORAL | 0 refills | Status: DC

## 2016-11-11 NOTE — Care Management Note (Signed)
Case Management Note  Patient Details  Name: [name] MRN: [id number] Date of Birth: November 24, 1933  Subjective/Objective:     CM spoke with patient.  Confirmed she has family support from her sons and they check on her.                 Action/Plan:  Patient to receive [facility] therapy and nurse's aide until she is more stable.    Expected Discharge Date:     11/11/16             Expected Discharge Plan:  [place]  In-House Referral:     Discharge planning Services  CM Consult  Post Acute Care Choice:  Home Health Choice offered to:  Patient  DME Arranged:    DME Agency:  [facility] Arranged:  PT, Nurse's Aide [facility] Agency:  [place]  Status of Service:  Completed, signed off  If discussed at [place] of Stay Meetings, dates discussed:    Additional Comments:  Briant Sites, RN 11/11/2016, 12:54 PM

## 2016-11-11 NOTE — Discharge Summary (Signed)
Physician Discharge Summary  GESSELLE FITZSIMONS ZLD:[id number] DOB: 02-04-1933 DOA: 11/10/2016  PCP: [name]., MD  Admit date: 11/10/2016 Discharge date: 11/11/2016  Admitted From:home Disposition:home with home care services  Recommendations for Outpatient Follow-up:  1. Follow up with PCP in 1-2 weeks 2. Please obtain BMP/CBC in one week   Home Health:yes Equipment/Devices:no Discharge Condition:stable CODE STATUS:full Diet recommendation:Carb modified heart healthy diet.  Brief/Interim Summary:82 y.o. female with a history of atrial fibrillation CHADS 2 Vasc Score of 5, not on anticoagulation due to falls, chronic kidney disease with baseline creatinine of 1.7-2, history of breast carcinoma, history of depression, diabetes, fibromyalgia, hypertension, obesity presented with diarrhea, abdominal pain and possible confusion on admission. Patient lives by herself. Patient reported she was not eating well because of abdominal pain. In the ER UA showed UTI. CT scan of abdomen showed a lipase. Patient was treated with antibiotics and admitted for further evaluation.  #Mild colitis probably contributing abdominal pain and diarrhea. Treated with Cipro and Flagyl. Patient reported she has no nausea vomiting or abdominal pain today. She wants to go home. Reported diarrhea is improving. Able to tolerate diet without difficulties. Plan to discharge home with oral antibiotics to complete 10 days course. I advised patient to follow-up with her PCP and awake. I also recommended to follow-up with PCP orGI for possible colonoscopy evaluation.  #Asymptomatic UTI: I don't think we need to treat for this. Anyway, she is already on Cipro for the treatment of colitis which will cover.  Her serum creatinine level around baseline for security status 4. I advised patient to follow-up with her PCP for the management of hypertension, type 2 diabetes, A. Fib. Hydrochlorothiazide  was discontinued because of  hyponatremia in this elderly female. Hyponatremia can increased the risk of fall.  Today, patient reported she is eating without difficulties. She has no symptoms and diarrhea is improving and asking to go home today. Home care referred for physical therapy and nursing on discharge. At this time, patient is medically stable.  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 and hypertension (HCC)   Atrial fibrillation (HCC)   Hypertension   Obesity   Colitis   Elevated troponin I level    Discharge Instructions  Discharge Instructions    Call MD for:  difficulty breathing, headache or visual disturbances    Complete by:  As directed    Call MD for:  extreme fatigue    Complete by:  As directed    Call MD for:  hives    Complete by:  As directed    Call MD for:  persistant dizziness or light-headedness    Complete by:  As directed    Call MD for:  persistant nausea and vomiting    Complete by:  As directed    Call MD for:  severe uncontrolled pain    Complete by:  As directed    Call MD for:  temperature >100.4    Complete by:  As directed    Diet - low sodium heart healthy    Complete by:  As directed    Diet Carb Modified    Complete by:  As directed    Discharge instructions    Complete by:  As directed    Please take soft food for 2-3 days  Please take yogurt or probiotics while on antibiotics   Increase activity slowly    Complete by:  As directed        Medication List    STOP taking these medications   lisinopril-hydrochlorothiazide 20-25 MG tablet Commonly known as:  PRINZIDE,ZESTORETIC     TAKE these medications   albuterol 108 (90 Base) MCG/ACT inhaler Commonly known as:  PROAIR HFA Inhale 2 puffs into the lungs every 6 (six) hours as needed for wheezing or shortness of breath.   aspirin EC 81 MG tablet Take 81 mg by mouth daily.   ciprofloxacin 500 MG tablet Commonly known  as:  CIPRO Take 1 tablet (500 mg total) by mouth daily.   esomeprazole 40 MG capsule Commonly known as:  NEXIUM Take 40 mg by mouth daily.   fenofibrate 145 MG tablet Commonly known as:  TRICOR Take 145 mg by mouth daily.   furosemide 40 MG tablet Commonly known as:  LASIX Take 40 mg by mouth 2 (two) times daily.   glipiZIDE 10 MG tablet Commonly known as:  GLUCOTROL Take 10 mg by mouth 2 (two) times daily.   HYDROcodone-acetaminophen 10-325 MG tablet Commonly known as:  NORCO Take 1 tablet by mouth every 4 (four) hours as needed for moderate pain.   lisinopril 20 MG tablet Commonly known as:  PRINIVIL,ZESTRIL Take 1 tablet (20 mg total) by mouth daily. Start taking on:  11/12/2016   loperamide 2 MG capsule Commonly known as:  IMODIUM Take 4 mg by mouth as needed for diarrhea or loose stools.   metroNIDAZOLE 500 MG tablet Commonly known as:  FLAGYL Take 1 tablet (500 mg total) by mouth every 8 (eight) hours.   NOVOLOG FLEXPEN 100 UNIT/ML FlexPen Generic drug:  insulin aspart Inject 10 Units into the skin 3 (three) times daily with meals.   PARoxetine 40 MG tablet Commonly known as:  PAXIL Take 40 mg by mouth daily.   pregabalin 50 MG capsule Commonly known as:  LYRICA Take 50 mg by mouth 3 (three) times daily.   raloxifene 60 MG tablet Commonly known as:  EVISTA Take 60 mg by mouth daily.   rosuvastatin 40 MG tablet Commonly known as:  CRESTOR Take 1 tablet (40 mg total) by mouth at bedtime.   TRESIBA FLEXTOUCH 200 UNIT/ML Sopn Generic drug:  Insulin Degludec Inject 16 Units into the skin at bedtime.      Follow-up Information    Cassell Smiles., MD. Schedule an appointment as soon as possible for a visit in 1 week(s).   Specialty:  Internal Medicine Contact information: [address] [place] [address] [phone]          Allergies  Allergen Reactions  . Mold Extract [Trichophyton Mentagrophyte] Other (See Comments)     Reaction:  Itchy, watery eyes/sneezing   . Morphine And Related Other (See Comments)    Reaction:  Drowsiness  . Niaspan [Niacin] Other (See Comments)    Reaction:  Flushing   . Pollen Extract Other (See Comments)    Reaction:  Itchy, watery eyes/sneezing     Consultations:None   Procedures/Studies: None   Subjective: Patient was seen and examined at bedside. Patient reported she is feeling much better today. Denied any headache, dizziness, fever, chills, nausea, vomiting, chest pain, shortness of breath, abdominal pain. Reported diarrhea is improving. Able to tolerate diet without difficulties.   Discharge Exam: Vitals:   11/10/16 2006 11/11/16 0648  BP: (!) 149/65 133/67  Pulse: 74 77  Resp: 20 20  Temp: 98.3 F (36.8 C) 98.4 F (36.9 C)   Vitals:   11/10/16 1800  11/10/16 1830 11/10/16 2006 11/11/16 0648  BP: 151/60 171/69 (!) 149/65 133/67  Pulse: 77 72 74 77  Resp: $Remo'15 20 20 20  'QfATQ$ Temp:   98.3 F (36.8 C) 98.4 F (36.9 C)  TempSrc:   Oral Oral  SpO2: 97% 96% 90% 96%  Weight:   99.2 kg (218 lb 11.1 oz)   Height:   '5\' 11"'$  (1.803 m)     General: Pleasant elderly female lying in bed comfortable. Pt is alert, awake, not in acute distress Cardiovascular: RRR, S1/S2 +, no rubs, no gallops Respiratory: CTA bilaterally, no wheezing, no rhonchi Abdominal: Soft, NT, ND, bowel sounds + Extremities: no edema, no cyanosis    The results of significant diagnostics from this hospitalization (including imaging, microbiology, ancillary and laboratory) are listed below for reference.     Microbiology: Recent Results (from the past 240 hour(s))  C difficile quick scan w PCR reflex     Status: None   Collection Time: 11/10/16  3:41 PM  Result Value Ref Range Status   C Diff antigen NEGATIVE NEGATIVE Final   C Diff toxin NEGATIVE NEGATIVE Final   C Diff interpretation No C. difficile detected.  Final  MRSA PCR Screening     Status: Abnormal   Collection Time: 11/10/16  9:59  PM  Result Value Ref Range Status   MRSA by PCR POSITIVE (A) NEGATIVE Final    Comment:        The GeneXpert MRSA Assay (FDA approved for NASAL specimens only), is one component of a comprehensive MRSA colonization surveillance program. It is not intended to diagnose MRSA infection nor to guide or monitor treatment for MRSA infections. RESULT CALLED TO, READ BACK BY AND VERIFIED WITH: NURSE BULLOCK AT 0013 ON 11/11/2016 BY EVA      Labs: BNP (last 3 results) No results for input(s): BNP in the last 8760 hours. Basic Metabolic Panel:  Recent Labs Lab 11/10/16 1432 11/10/16 1837 11/11/16 0642  NA 128* 130* 133*  K 4.1 3.6 3.8  CL 92* 92* 98*  CO2 $Re'24 26 25  'TzA$ GLUCOSE 559* 426* 359*  BUN 41* 39* 40*  CREATININE 1.76* 1.71* 1.85*  CALCIUM 9.8 9.8 9.4   Liver Function Tests:  Recent Labs Lab 11/10/16 1432  AST 13*  ALT 11*  ALKPHOS 61  BILITOT 0.7  PROT 7.0  ALBUMIN 2.9*    Recent Labs Lab 11/10/16 1441  LIPASE 23   No results for input(s): AMMONIA in the last 168 hours. CBC:  Recent Labs Lab 11/10/16 1432 11/11/16 0642  WBC 9.5 7.3  NEUTROABS 7.7  --   HGB 11.4* 11.2*  HCT 33.9* 34.3*  MCV 82.7 83.5  PLT 301 294   Cardiac Enzymes:  Recent Labs Lab 11/10/16 1441  TROPONINI 0.04*   BNP: Invalid input(s): POCBNP CBG:  Recent Labs Lab 11/10/16 1601 11/10/16 1800 11/10/16 2049 11/11/16 0729 11/11/16 1136  GLUCAP 417* 394* 341* 335* 339*   D-Dimer No results for input(s): DDIMER in the last 72 hours. Hgb A1c No results for input(s): HGBA1C in the last 72 hours. Lipid Profile No results for input(s): CHOL, HDL, LDLCALC, TRIG, CHOLHDL, LDLDIRECT in the last 72 hours. Thyroid function studies No results for input(s): TSH, T4TOTAL, T3FREE, THYROIDAB in the last 72 hours.  Invalid input(s): FREET3 Anemia work up No results for input(s): VITAMINB12, FOLATE, FERRITIN, TIBC, IRON, RETICCTPCT in the last 72 hours. Urinalysis     Component Value Date/Time   COLORURINE YELLOW 11/10/2016 1541  APPEARANCEUR CLOUDY (A) 11/10/2016 1541   LABSPEC 1.023 11/10/2016 1541   PHURINE 5.0 11/10/2016 1541   GLUCOSEU >=500 (A) 11/10/2016 1541   GLUCOSEU neg 04/07/2010   HGBUR MODERATE (A) 11/10/2016 1541   BILIRUBINUR NEGATIVE 11/10/2016 1541   KETONESUR NEGATIVE 11/10/2016 1541   PROTEINUR 100 (A) 11/10/2016 1541   UROBILINOGEN 0.2 09/27/2014 1630   NITRITE POSITIVE (A) 11/10/2016 1541   LEUKOCYTESUR NEGATIVE 11/10/2016 1541   Sepsis Labs Invalid input(s): PROCALCITONIN,  WBC,  LACTICIDVEN Microbiology Recent Results (from the past 240 hour(s))  C difficile quick scan w PCR reflex     Status: None   Collection Time: 11/10/16  3:41 PM  Result Value Ref Range Status   C Diff antigen NEGATIVE NEGATIVE Final   C Diff toxin NEGATIVE NEGATIVE Final   C Diff interpretation No C. difficile detected.  Final  MRSA PCR Screening     Status: Abnormal   Collection Time: 11/10/16  9:59 PM  Result Value Ref Range Status   MRSA by PCR POSITIVE (A) NEGATIVE Final    Comment:        The GeneXpert MRSA Assay (FDA approved for NASAL specimens only), is one component of a comprehensive MRSA colonization surveillance program. It is not intended to diagnose MRSA infection nor to guide or monitor treatment for MRSA infections. RESULT CALLED TO, READ BACK BY AND VERIFIED WITH: NURSE BULLOCK AT 0013 ON 11/11/2016 BY EVA      Time coordinating discharge: 27 minutes  SIGNED:   Rosita Fire, MD  [facility] 11/11/2016, 12:42 PM  If 7PM-7AM, please contact night-coverage [email or web address] Password [email or web address]

## 2016-11-11 NOTE — Progress Notes (Signed)
Patient with orders to be discharge home. Discharge instructions given, patient and son verbalized understanding. Patient stable. Patient left in private vehicle with son.

## 2016-11-11 NOTE — Care Management (Signed)
CM spoke with patient and staff regarding d/c home.  Patient is agreeable to having home health for assistance for therapy.

## 2016-11-13 DIAGNOSIS — N189 Chronic kidney disease, unspecified: Secondary | ICD-10-CM | POA: Diagnosis not present

## 2016-11-13 DIAGNOSIS — I350 Nonrheumatic aortic (valve) stenosis: Secondary | ICD-10-CM | POA: Diagnosis not present

## 2016-11-13 DIAGNOSIS — I129 Hypertensive chronic kidney disease with stage 1 through stage 4 chronic kidney disease, or unspecified chronic kidney disease: Secondary | ICD-10-CM | POA: Diagnosis not present

## 2016-11-13 DIAGNOSIS — E1122 Type 2 diabetes mellitus with diabetic chronic kidney disease: Secondary | ICD-10-CM | POA: Diagnosis not present

## 2016-11-13 DIAGNOSIS — J45909 Unspecified asthma, uncomplicated: Secondary | ICD-10-CM | POA: Diagnosis not present

## 2016-11-13 DIAGNOSIS — N390 Urinary tract infection, site not specified: Secondary | ICD-10-CM | POA: Diagnosis not present

## 2016-11-13 DIAGNOSIS — E538 Deficiency of other specified B group vitamins: Secondary | ICD-10-CM | POA: Diagnosis not present

## 2016-11-13 DIAGNOSIS — I4891 Unspecified atrial fibrillation: Secondary | ICD-10-CM | POA: Diagnosis not present

## 2016-11-13 DIAGNOSIS — M1991 Primary osteoarthritis, unspecified site: Secondary | ICD-10-CM | POA: Diagnosis not present

## 2016-11-13 DIAGNOSIS — E669 Obesity, unspecified: Secondary | ICD-10-CM | POA: Diagnosis not present

## 2016-11-14 ENCOUNTER — Other Ambulatory Visit: Payer: Self-pay | Admitting: "Endocrinology

## 2016-11-15 ENCOUNTER — Ambulatory Visit (HOSPITAL_COMMUNITY): Payer: Self-pay | Admitting: Oncology

## 2016-11-16 DIAGNOSIS — R269 Unspecified abnormalities of gait and mobility: Secondary | ICD-10-CM | POA: Diagnosis not present

## 2016-11-16 DIAGNOSIS — R296 Repeated falls: Secondary | ICD-10-CM | POA: Diagnosis not present

## 2016-11-21 ENCOUNTER — Encounter (HOSPITAL_COMMUNITY): Payer: Self-pay | Admitting: *Deleted

## 2016-11-21 ENCOUNTER — Inpatient Hospital Stay (HOSPITAL_COMMUNITY)
Admission: EM | Admit: 2016-11-21 | Discharge: 2016-11-23 | DRG: 683 | Disposition: A | Discharge status: Home Health | Payer: MEDICARE | Attending: Internal Medicine | Admitting: Internal Medicine

## 2016-11-21 DIAGNOSIS — M797 Fibromyalgia: Secondary | ICD-10-CM | POA: Diagnosis not present

## 2016-11-21 DIAGNOSIS — N184 Chronic kidney disease, stage 4 (severe): Secondary | ICD-10-CM | POA: Diagnosis present

## 2016-11-21 DIAGNOSIS — Z7982 Long term (current) use of aspirin: Secondary | ICD-10-CM

## 2016-11-21 DIAGNOSIS — E538 Deficiency of other specified B group vitamins: Secondary | ICD-10-CM | POA: Diagnosis not present

## 2016-11-21 DIAGNOSIS — Z820 Family history of epilepsy and other diseases of the nervous system: Secondary | ICD-10-CM

## 2016-11-21 DIAGNOSIS — D631 Anemia in chronic kidney disease: Secondary | ICD-10-CM | POA: Diagnosis present

## 2016-11-21 DIAGNOSIS — N179 Acute kidney failure, unspecified: Principal | ICD-10-CM | POA: Diagnosis present

## 2016-11-21 DIAGNOSIS — Z8249 Family history of ischemic heart disease and other diseases of the circulatory system: Secondary | ICD-10-CM

## 2016-11-21 DIAGNOSIS — L89151 Pressure ulcer of sacral region, stage 1: Secondary | ICD-10-CM | POA: Diagnosis present

## 2016-11-21 DIAGNOSIS — N189 Chronic kidney disease, unspecified: Secondary | ICD-10-CM | POA: Diagnosis not present

## 2016-11-21 DIAGNOSIS — J45909 Unspecified asthma, uncomplicated: Secondary | ICD-10-CM | POA: Diagnosis not present

## 2016-11-21 DIAGNOSIS — E1122 Type 2 diabetes mellitus with diabetic chronic kidney disease: Secondary | ICD-10-CM | POA: Diagnosis present

## 2016-11-21 DIAGNOSIS — N3000 Acute cystitis without hematuria: Secondary | ICD-10-CM | POA: Diagnosis present

## 2016-11-21 DIAGNOSIS — R739 Hyperglycemia, unspecified: Secondary | ICD-10-CM | POA: Diagnosis not present

## 2016-11-21 DIAGNOSIS — A09 Infectious gastroenteritis and colitis, unspecified: Secondary | ICD-10-CM | POA: Diagnosis not present

## 2016-11-21 DIAGNOSIS — M79603 Pain in arm, unspecified: Secondary | ICD-10-CM | POA: Diagnosis not present

## 2016-11-21 DIAGNOSIS — I482 Chronic atrial fibrillation: Secondary | ICD-10-CM | POA: Diagnosis not present

## 2016-11-21 DIAGNOSIS — I4891 Unspecified atrial fibrillation: Secondary | ICD-10-CM | POA: Diagnosis not present

## 2016-11-21 DIAGNOSIS — Z885 Allergy status to narcotic agent status: Secondary | ICD-10-CM

## 2016-11-21 DIAGNOSIS — E11319 Type 2 diabetes mellitus with unspecified diabetic retinopathy without macular edema: Secondary | ICD-10-CM | POA: Diagnosis present

## 2016-11-21 DIAGNOSIS — F329 Major depressive disorder, single episode, unspecified: Secondary | ICD-10-CM | POA: Diagnosis present

## 2016-11-21 DIAGNOSIS — D638 Anemia in other chronic diseases classified elsewhere: Secondary | ICD-10-CM | POA: Diagnosis present

## 2016-11-21 DIAGNOSIS — R197 Diarrhea, unspecified: Secondary | ICD-10-CM | POA: Diagnosis present

## 2016-11-21 DIAGNOSIS — T501X5A Adverse effect of loop [high-ceiling] diuretics, initial encounter: Secondary | ICD-10-CM | POA: Diagnosis present

## 2016-11-21 DIAGNOSIS — E1165 Type 2 diabetes mellitus with hyperglycemia: Secondary | ICD-10-CM | POA: Diagnosis present

## 2016-11-21 DIAGNOSIS — Z9181 History of falling: Secondary | ICD-10-CM

## 2016-11-21 DIAGNOSIS — Z853 Personal history of malignant neoplasm of breast: Secondary | ICD-10-CM

## 2016-11-21 DIAGNOSIS — K580 Irritable bowel syndrome with diarrhea: Secondary | ICD-10-CM | POA: Diagnosis present

## 2016-11-21 DIAGNOSIS — Z79899 Other long term (current) drug therapy: Secondary | ICD-10-CM

## 2016-11-21 DIAGNOSIS — I6789 Other cerebrovascular disease: Secondary | ICD-10-CM | POA: Diagnosis present

## 2016-11-21 DIAGNOSIS — Z87891 Personal history of nicotine dependence: Secondary | ICD-10-CM

## 2016-11-21 DIAGNOSIS — E785 Hyperlipidemia, unspecified: Secondary | ICD-10-CM | POA: Diagnosis present

## 2016-11-21 DIAGNOSIS — E871 Hypo-osmolality and hyponatremia: Secondary | ICD-10-CM | POA: Diagnosis present

## 2016-11-21 DIAGNOSIS — F419 Anxiety disorder, unspecified: Secondary | ICD-10-CM | POA: Diagnosis present

## 2016-11-21 DIAGNOSIS — E860 Dehydration: Secondary | ICD-10-CM | POA: Diagnosis present

## 2016-11-21 DIAGNOSIS — E875 Hyperkalemia: Secondary | ICD-10-CM | POA: Diagnosis not present

## 2016-11-21 DIAGNOSIS — E669 Obesity, unspecified: Secondary | ICD-10-CM | POA: Diagnosis not present

## 2016-11-21 DIAGNOSIS — T464X5A Adverse effect of angiotensin-converting-enzyme inhibitors, initial encounter: Secondary | ICD-10-CM | POA: Diagnosis present

## 2016-11-21 DIAGNOSIS — M1991 Primary osteoarthritis, unspecified site: Secondary | ICD-10-CM | POA: Diagnosis not present

## 2016-11-21 DIAGNOSIS — Z6830 Body mass index (BMI) 30.0-30.9, adult: Secondary | ICD-10-CM

## 2016-11-21 DIAGNOSIS — Z9011 Acquired absence of right breast and nipple: Secondary | ICD-10-CM

## 2016-11-21 DIAGNOSIS — I129 Hypertensive chronic kidney disease with stage 1 through stage 4 chronic kidney disease, or unspecified chronic kidney disease: Secondary | ICD-10-CM | POA: Diagnosis not present

## 2016-11-21 DIAGNOSIS — Z794 Long term (current) use of insulin: Secondary | ICD-10-CM | POA: Diagnosis not present

## 2016-11-21 DIAGNOSIS — R296 Repeated falls: Secondary | ICD-10-CM | POA: Diagnosis present

## 2016-11-21 DIAGNOSIS — I350 Nonrheumatic aortic (valve) stenosis: Secondary | ICD-10-CM | POA: Diagnosis not present

## 2016-11-21 DIAGNOSIS — R631 Polydipsia: Secondary | ICD-10-CM | POA: Diagnosis present

## 2016-11-21 DIAGNOSIS — Z888 Allergy status to other drugs, medicaments and biological substances status: Secondary | ICD-10-CM

## 2016-11-21 DIAGNOSIS — Z91048 Other nonmedicinal substance allergy status: Secondary | ICD-10-CM

## 2016-11-21 DIAGNOSIS — N390 Urinary tract infection, site not specified: Secondary | ICD-10-CM | POA: Diagnosis not present

## 2016-11-21 LAB — COMPREHENSIVE METABOLIC PANEL
ALBUMIN: 3.1 g/dL — AB (ref 3.5–5.0)
ALK PHOS: 38 U/L (ref 38–126)
ALT: 9 U/L — AB (ref 14–54)
ANION GAP: 9 (ref 5–15)
AST: 14 U/L — ABNORMAL LOW (ref 15–41)
BILIRUBIN TOTAL: 0.2 mg/dL — AB (ref 0.3–1.2)
BUN: 85 mg/dL — AB (ref 6–20)
CALCIUM: 8.6 mg/dL — AB (ref 8.9–10.3)
CO2: 27 mmol/L (ref 22–32)
CREATININE: 3.46 mg/dL — AB (ref 0.44–1.00)
Chloride: 97 mmol/L — ABNORMAL LOW (ref 101–111)
GFR calc Af Amer: 13 mL/min — ABNORMAL LOW (ref >60)
GFR calc non Af Amer: 11 mL/min — ABNORMAL LOW (ref >60)
GLUCOSE: 591 mg/dL — AB (ref 65–99)
Potassium: 4.5 mmol/L (ref 3.5–5.1)
Sodium: 133 mmol/L — ABNORMAL LOW (ref 135–145)
TOTAL PROTEIN: 6.4 g/dL — AB (ref 6.5–8.1)

## 2016-11-21 LAB — CBC WITH DIFFERENTIAL/PLATELET
Basophils Absolute: 0 10*3/uL (ref 0.0–0.1)
Basophils Relative: 0 %
Eosinophils Absolute: 0.1 10*3/uL (ref 0.0–0.7)
Eosinophils Relative: 2 %
HEMATOCRIT: 34.4 % — AB (ref 36.0–46.0)
HEMOGLOBIN: 10.5 g/dL — AB (ref 12.0–15.0)
LYMPHS ABS: 1 10*3/uL (ref 0.7–4.0)
Lymphocytes Relative: 20 %
MCH: 27.1 pg (ref 26.0–34.0)
MCHC: 30.5 g/dL (ref 30.0–36.0)
MCV: 88.9 fL (ref 78.0–100.0)
MONOS PCT: 7 %
Monocytes Absolute: 0.4 10*3/uL (ref 0.1–1.0)
NEUTROS ABS: 3.6 10*3/uL (ref 1.7–7.7)
NEUTROS PCT: 71 %
Platelets: 280 10*3/uL (ref 150–400)
RBC: 3.87 MIL/uL (ref 3.87–5.11)
RDW: 13.5 % (ref 11.5–15.5)
WBC: 5.1 10*3/uL (ref 4.0–10.5)

## 2016-11-21 LAB — URINALYSIS, ROUTINE W REFLEX MICROSCOPIC
Bilirubin Urine: NEGATIVE
Glucose, UA: >=500 mg/dL — AB
Ketones, ur: NEGATIVE mg/dL
Nitrite: NEGATIVE
PH: 5 (ref 5.0–8.0)
Protein, ur: 30 mg/dL — AB
SPECIFIC GRAVITY, URINE: 1.011 (ref 1.005–1.030)

## 2016-11-21 LAB — CBG MONITORING, ED
GLUCOSE-CAPILLARY: 582 mg/dL — AB (ref 65–99)
GLUCOSE-CAPILLARY: 373 mg/dL — AB (ref 65–99)
Glucose-Capillary: 498 mg/dL — ABNORMAL HIGH (ref 65–99)
Glucose-Capillary: 365 mg/dL — ABNORMAL HIGH (ref 65–99)

## 2016-11-21 LAB — MAGNESIUM: MAGNESIUM: 2.1 mg/dL (ref 1.7–2.4)

## 2016-11-21 MED ORDER — ASPIRIN EC 81 MG PO TBEC
81.0000 mg | DELAYED_RELEASE_TABLET | Freq: Every day | ORAL | Status: DC
  Administered 2016-11-22 – 2016-11-23 (×2): 81 mg via ORAL
  Filled 2016-11-21 (×2): qty 1

## 2016-11-21 MED ORDER — ONDANSETRON HCL 4 MG/2ML IJ SOLN: 4.0000 mg | Freq: Four times a day (QID) | INTRAMUSCULAR | Status: DC | PRN

## 2016-11-21 MED ORDER — PREGABALIN 50 MG PO CAPS
50.0000 mg | ORAL_CAPSULE | Freq: Three times a day (TID) | ORAL | Status: DC
  Administered 2016-11-22 – 2016-11-23 (×5): 50 mg via ORAL
  Filled 2016-11-21 (×5): qty 1

## 2016-11-21 MED ORDER — INSULIN ASPART 100 UNIT/ML ~~LOC~~ SOLN
0.0000 [IU] | SUBCUTANEOUS | Status: DC
  Administered 2016-11-22: 15 [IU] via SUBCUTANEOUS
  Administered 2016-11-22: 7 [IU] via SUBCUTANEOUS
  Administered 2016-11-22: 4 [IU] via SUBCUTANEOUS
  Administered 2016-11-22: 7 [IU] via SUBCUTANEOUS

## 2016-11-21 MED ORDER — SODIUM CHLORIDE 0.9 % IV BOLUS (SEPSIS)
1000.0000 mL | Freq: Once | INTRAVENOUS | Status: AC
  Administered 2016-11-21: 1000 mL via INTRAVENOUS

## 2016-11-21 MED ORDER — SODIUM CHLORIDE 0.9 % IV SOLN
INTRAVENOUS | Status: DC
  Administered 2016-11-21 – 2016-11-23 (×2): via INTRAVENOUS

## 2016-11-21 MED ORDER — PAROXETINE HCL 20 MG PO TABS
40.0000 mg | ORAL_TABLET | Freq: Every day | ORAL | Status: DC
  Administered 2016-11-22 – 2016-11-23 (×2): 40 mg via ORAL
  Filled 2016-11-21 (×2): qty 2

## 2016-11-21 MED ORDER — ONDANSETRON HCL 4 MG PO TABS: 4.0000 mg | ORAL_TABLET | Freq: Four times a day (QID) | ORAL | Status: DC | PRN

## 2016-11-21 MED ORDER — TRAZODONE HCL 50 MG PO TABS: 25.0000 mg | ORAL_TABLET | Freq: Every evening | ORAL | Status: DC | PRN

## 2016-11-21 MED ORDER — ROSUVASTATIN CALCIUM 20 MG PO TABS
40.0000 mg | ORAL_TABLET | Freq: Every day | ORAL | Status: DC
  Administered 2016-11-22 (×2): 40 mg via ORAL
  Filled 2016-11-21 (×2): qty 2

## 2016-11-21 MED ORDER — HYDROCODONE-ACETAMINOPHEN 10-325 MG PO TABS
1.0000 | ORAL_TABLET | ORAL | Status: DC | PRN
  Administered 2016-11-22 – 2016-11-23 (×3): 1 via ORAL
  Filled 2016-11-21 (×3): qty 1

## 2016-11-21 MED ORDER — SODIUM CHLORIDE 0.9% FLUSH
3.0000 mL | Freq: Two times a day (BID) | INTRAVENOUS | Status: DC
  Administered 2016-11-21 – 2016-11-22 (×2): 3 mL via INTRAVENOUS

## 2016-11-21 MED ORDER — ALBUTEROL SULFATE (2.5 MG/3ML) 0.083% IN NEBU
2.5000 mg | INHALATION_SOLUTION | RESPIRATORY_TRACT | Status: DC | PRN
  Administered 2016-11-22: 2.5 mg via RESPIRATORY_TRACT
  Filled 2016-11-21: qty 3

## 2016-11-21 MED ORDER — DEXTROSE 5 % IV SOLN
1.0000 g | INTRAVENOUS | Status: DC
  Filled 2016-11-21: qty 10

## 2016-11-21 MED ORDER — RALOXIFENE HCL 60 MG PO TABS
60.0000 mg | ORAL_TABLET | Freq: Every day | ORAL | Status: DC
  Administered 2016-11-22 – 2016-11-23 (×2): 60 mg via ORAL
  Filled 2016-11-21 (×2): qty 1

## 2016-11-21 MED ORDER — FENOFIBRATE 54 MG PO TABS
54.0000 mg | ORAL_TABLET | Freq: Every day | ORAL | Status: DC
  Filled 2016-11-21: qty 1

## 2016-11-21 MED ORDER — PANTOPRAZOLE SODIUM 40 MG PO PACK
40.0000 mg | PACK | Freq: Every day | ORAL | Status: DC
  Administered 2016-11-22 – 2016-11-23 (×2): 40 mg via ORAL
  Filled 2016-11-21 (×2): qty 20

## 2016-11-21 MED ORDER — INSULIN GLARGINE 100 UNIT/ML ~~LOC~~ SOLN
40.0000 [IU] | Freq: Two times a day (BID) | SUBCUTANEOUS | Status: DC
  Administered 2016-11-22 (×2): 40 [IU] via SUBCUTANEOUS
  Filled 2016-11-21 (×4): qty 0.4

## 2016-11-21 MED ORDER — HEPARIN SODIUM (PORCINE) 5000 UNIT/ML IJ SOLN
5000.0000 [IU] | Freq: Three times a day (TID) | INTRAMUSCULAR | Status: DC
  Administered 2016-11-22 – 2016-11-23 (×5): 5000 [IU] via SUBCUTANEOUS
  Filled 2016-11-21 (×5): qty 1

## 2016-11-21 MED ORDER — CEFTRIAXONE SODIUM 1 G IJ SOLR
1.0000 g | Freq: Once | INTRAMUSCULAR | Status: AC
  Administered 2016-11-21: 1 g via INTRAVENOUS
  Filled 2016-11-21: qty 10

## 2016-11-21 MED ORDER — INSULIN ASPART 100 UNIT/ML ~~LOC~~ SOLN
10.0000 [IU] | Freq: Once | SUBCUTANEOUS | Status: AC
Start: 2016-11-21 — End: 2016-11-21
  Administered 2016-11-21: 10 [IU] via INTRAVENOUS
  Filled 2016-11-21: qty 1

## 2016-11-21 NOTE — ED Triage Notes (Signed)
Pt comes in by EMS from home for hyperglycemia. EMS states pt was here this weekend for the same. Pt had home health at home today and they are the ones who called 911.  CBG 525 by EMS.   Pt alert and oriented.

## 2016-11-21 NOTE — H&P (Signed)
HOSPITAL HISTORY AND PHYSICAL  Admission Date: 11/21/2016 09:25 PM   Assessment and Plan:    Problem  Acute Kidney Injury Superimposed On Chronic Kidney Disease (hcc) Admission Cr 3.46. Baseline Cr appears to be 1.7. Likely from dehydration from frequent diarrhea, but could have other causes. Plan: Trial of IVF. If no improvement then investigate for other causes. Will hold ACE inhibitor initially.  Hyperglycemia Has DM 2 difficult to control. Likely elevated due to chronic issues and also acute infection. Not in DKA. Plan: Start lantus and continue until under better control. Use high dose sliding scale. FSBS q 4 hours or more frequently depending on results.  Acute Cystitis Without Hematuria Suspected. Plan: Urine and blood cultures drawn. IV ceftriaxone empirically.  Type 2 DM With Ckd Stage 4 and Hypertension (hcc) Plan: Hold oral diabetes medications.  Check fingerstick blood sugars q 4hrs initially.  Sliding scale insulin.   Check A1c.  Diarrhea of Presumed Infectious Origin Admitted earlier in December 2017 with possible colitis. Plan: IVF. C diff ordered. Unable to order stool cultures in electronic orders but consider in am.  B12 Deficiency Chronic mild B-12 deficiency anemia. Plan: Monitor. Home treatment with B-12.     _____________________________  PRIMARY CARE PROVIDER: Dr. Redmond School  HPI: The patient is an 82 yo woman with DM type 2, CKD stage 4, history of breast cancer diagnosed 2007, B-12 deficiency anemia, who was recently admitted 11/10/16 - 11/11/16 with a UTI who presents today because her fingerstick glucose (FSBS) was high, and also because she has had some abdominal pain. She states she has felt bad for the last 3 days, with severe fatigue. Regarding the FSBS: she reports her DM type 2 has been hard to control and that her FSBS are usually high and her a1c is high, but it was very high today so her physical therapist told her to go to  the emergency department. Associated symptoms: polyuria and polydipsia.  Regarding the abdominal pain: Onset: intermittent chronically but worse starting last week. Duration: intermittent.  Location: epigastric, RUQ, and RLQ. Radiation: None. Character: 5/10 usually, described as a dull ache. Alleviated by: Nothing. Exacerbated by: Nothing. Associated Symptoms: Nausea. Diarrhea that started about 4 days ago, but worse starting yesterday, with episodes many times per day, characterized as watery diarrhea.  No vomiting, constipation, or bloody stool. Mild cough that is non-productive. Has developed a sore in her sacral area that started at home over the last week. Treatments: none at home except usual medications. She did complete a course of antibiotics from her previous admission. She tried Pepto-Bismol at home and it did not help.  Past Medical History:  Diagnosis Date  . Anemia   . Aortic stenosis    Mild  . Arthritis   . Asthma   . Atrial flutter ([place]) 2002  . B12 deficiency 06/20/2015  . B12 deficiency 06/20/2015  . Breast carcinoma ([place])   . Cerebrovascular disease    with a 21% LICA; repeat study in 10/2009-no obstructive disease; modest ASVD  . Chronic kidney disease    Creatinine-1.5 in 2010 and 2.5-3 in 2011; 1.5-10/2010;  Klebsiella UTI-10/2010. urine protein 36 mg/dl, mildly elevated  . Decreased bone density   . Depression   . Diabetes mellitus   . Diabetic retinopathy   . DJD (degenerative joint disease) 11/14/2011  . Falls infrequently 01/2010   fracture of pelvis and left humerus  . Fibromyalgia   . Headaches, cluster   . Hyperlipidemia   . Hypertension   .  IBS (irritable bowel syndrome)    diverticulosis, gastroesophageal reflux disease  . Lower back pain   . Obesity 11/14/2011   Past Surgical History:  Procedure Laterality Date  . APPENDECTOMY    . CATARACT EXTRACTION, BILATERAL     Lens implants  . CENTRAL VENOUS CATHETER TUNNELED INSERTION SINGLE LUMEN     . CHOLECYSTECTOMY    . COLONOSCOPY  2012  . EYE SURGERY  nov 2012   for diabetic retinopathy  . LUMBAR DISC SURGERY     lumbosacral spine procedure x 2  . MASTECTOMY     Right breast for carcinoma   Social History   Social History  . Marital status: Widowed    Spouse name: N/A  . Number of children: 2  . Years of education: N/A   Occupational History  . On disability Retired   Social History Main Topics  . Smoking status: Former Smoker    Packs/day: 1.00    Years: 30.00    Types: Cigarettes    Quit date: 71  . Smokeless tobacco: Never Used  . Alcohol use No  . Drug use: No  . Sexual activity: Not on file   Other Topics Concern  . Not on file   Social History Narrative   Lives alone   Family History  Problem Relation Age of Onset  . Alzheimer's disease Mother   . Heart attack Father   . Aneurysm Sister   . Coronary artery disease Brother    Allergies  Allergen Reactions  . Mold Extract [Trichophyton Mentagrophyte] Other (See Comments)    Reaction:  Itchy, watery eyes/sneezing   . Morphine And Related Other (See Comments)    Reaction:  Drowsiness. "I didn't wake up for almost 1 week" after taking morphine.   . Niaspan [Niacin] Other (See Comments)    Reaction:  Flushing   . Pollen Extract Other (See Comments)    Reaction:  Itchy, watery eyes/sneezing      Current Outpatient Prescriptions on File Prior to Encounter  Medication Sig Dispense Refill  . albuterol (PROAIR HFA) 108 (90 BASE) MCG/ACT inhaler Inhale 2 puffs into the lungs every 6 (six) hours as needed for wheezing or shortness of breath. 1 Inhaler 3  . aspirin EC 81 MG tablet Take 81 mg by mouth daily.    Marland Kitchen esomeprazole (NEXIUM) 40 MG capsule Take 40 mg by mouth daily.     . fenofibrate (TRICOR) 145 MG tablet Take 145 mg by mouth daily.    . furosemide (LASIX) 40 MG tablet Take 40 mg by mouth 2 (two) times daily.     Marland Kitchen glipiZIDE (GLUCOTROL) 10 MG tablet Take 10 mg by mouth 2 (two) times  daily.     Marland Kitchen HYDROcodone-acetaminophen (NORCO) 10-325 MG per tablet Take 1 tablet by mouth every 4 (four) hours as needed for moderate pain.     Marland Kitchen insulin aspart (NOVOLOG FLEXPEN) 100 UNIT/ML FlexPen Inject 16 Units into the skin 3 (three) times daily with meals.     . Insulin Degludec (TRESIBA FLEXTOUCH) 200 UNIT/ML SOPN Inject 16 Units into the skin at bedtime.    Marland Kitchen lisinopril (PRINIVIL,ZESTRIL) 20 MG tablet Take 1 tablet (20 mg total) by mouth daily. 30 tablet 0  . loperamide (IMODIUM) 2 MG capsule Take 4 mg by mouth as needed for diarrhea or loose stools.    Marland Kitchen PARoxetine (PAXIL) 40 MG tablet Take 40 mg by mouth daily.     . pregabalin (LYRICA) 50 MG capsule Take 50  mg by mouth 3 (three) times daily.     . raloxifene (EVISTA) 60 MG tablet Take 60 mg by mouth daily.    . rosuvastatin (CRESTOR) 40 MG tablet Take 1 tablet (40 mg total) by mouth at bedtime. 90 tablet 4    ROS: GENERAL: No Fever, chills, or diaphoresis. Positive for fatigue/malaise.  HEENT: No nasal discharge or bleeding. No throat swelling but had some mild throat pain.  No eye pain or eye redness.  Has had vision problems that are ongoing and is almost blind in right eye chronically. RESPIRATORY: Minimal cough, wheezing, and shortness of breath but none are really above her baseline.  CARDIOVASCULAR: No chest pain or palpitations.  GI: See HPI. No vomiting, constipation, or bloody stool.  NEUROLOGICAL: No focal weakness. Daily headaches, unchanged. INTEGUMENT: Has developed a sore in her sacral area that started at home over the last week. Otherwise no rashes, itching, or new lesions.  LYMPHATIC SYSTEM: no lymph node swelling or pain.  MUSCULOSKELETAL: no new joint pain or joint swelling.  GENITOURINARY: No dysuria or hematuria.  ENDOCRINE: Polyuria and polydipsia.  HEME: Has chronic anemia. No acute bleeding. Has easy bruising.   Physical Exam:  Vitals:   11/21/16 2213 11/21/16 2237  BP: (!) 151/46 110/71  Pulse:  73 76  Resp: 16 18  Temp:  98.3 F (36.8 C)  TempSrc:  Oral  SpO2: 98% 94%  Weight:  101.1 kg (222 lb 14.2 oz)  Height:  $Remove'5\' 11"'eFYyGpD$  (1.803 m)    GENERAL: Ill-appearing, well nourished, obese, well-developed.  HEENT: Normocephalic, atraumatic; pupils equal and round. Nares patent, without discharge or bleeding. No oropharyngeal lesions or erythema. Mucous membranes are dry.  NECK: is supple, no masses, trachea midline.  RESPIRATORY: Clear to auscultation bilaterally. Chest wall movements are symmetric. No use of accessory muscles to breathe.  No wheezing, rales, rhonchi. CARDIOVASCULAR: Normal S1, S2. No rubs, or gallops. PMI non-displaced. Carotids: no carotid bruits. No bradycardia or tachycardia. DP pulses 2+ bilaterally.  GI: soft, non-distended, normal active bowel sounds. No hepatosplenomegaly. Tenderness in epigastric, RUQ, and RLQ areas. INTEGUMENT: Sacral area early decubitus ulcer stage 1. Scattered ecchymoses.  Otherwise clean, dry, and intact.  MUSCULOSKELETAL: Moving all extremities. No cyanosis. No clubbing. Edema: trace lower extremity edema bilaterally.  NEUROLOGICAL: Cranial nerves 2-12 grossly intact. Reflexes: 2+ bilaterally. Babinski: toes downgoing bilaterally.  Motor 4-/5 throughout and symmetric. Sensory grossly intact to light touch. Intact rapid alternating movements bilaterally. No pronator drift. Difficulty with walking noted: required assistance to take 2 steps to commode. PSYCHIATRIC: Fully oriented. Normal and appropriate affect.  LYMPHATIC: No cervical lymphadenopathy. No supraclavicular lymphadenopathy.  LABS:  Results for orders placed or performed during the hospital encounter of 11/21/16 (from the past 24 hour(s))  CBG monitoring, ED   Collection Time: 11/21/16  3:52 PM  Result Value Ref Range   Glucose-Capillary 582 (HH) 65 - 99 mg/dL   Comment 1 Notify RN   CBC with Differential (PNL)   Collection Time: 11/21/16  3:53 PM  Result Value Ref Range   WBC  5.1 4.0 - 10.5 K/uL   RBC 3.87 3.87 - 5.11 MIL/uL   Hemoglobin 10.5 (L) 12.0 - 15.0 g/dL   HCT 34.4 (L) 36.0 - 46.0 %   MCV 88.9 78.0 - 100.0 fL   MCH 27.1 26.0 - 34.0 pg   MCHC 30.5 30.0 - 36.0 g/dL   RDW 13.5 11.5 - 15.5 %   Platelets 280 150 - 400 K/uL  Neutrophils Relative % 71 %   Neutro Abs 3.6 1.7 - 7.7 K/uL   Lymphocytes Relative 20 %   Lymphs Abs 1.0 0.7 - 4.0 K/uL   Monocytes Relative 7 %   Monocytes Absolute 0.4 0.1 - 1.0 K/uL   Eosinophils Relative 2 %   Eosinophils Absolute 0.1 0.0 - 0.7 K/uL   Basophils Relative 0 %   Basophils Absolute 0.0 0.0 - 0.1 K/uL  Comprehensive metabolic panel   Collection Time: 11/21/16  3:53 PM  Result Value Ref Range   Sodium 133 (L) 135 - 145 mmol/L   Potassium 4.5 3.5 - 5.1 mmol/L   Chloride 97 (L) 101 - 111 mmol/L   CO2 27 22 - 32 mmol/L   Glucose, Bld 591 (HH) 65 - 99 mg/dL   BUN 85 (H) 6 - 20 mg/dL   Creatinine, Ser 3.46 (H) 0.44 - 1.00 mg/dL   Calcium 8.6 (L) 8.9 - 10.3 mg/dL   Total Protein 6.4 (L) 6.5 - 8.1 g/dL   Albumin 3.1 (L) 3.5 - 5.0 g/dL   AST 14 (L) 15 - 41 U/L   ALT 9 (L) 14 - 54 U/L   Alkaline Phosphatase 38 38 - 126 U/L   Total Bilirubin 0.2 (L) 0.3 - 1.2 mg/dL   GFR calc non Af Amer 11 (L) >60 mL/min   GFR calc Af Amer 13 (L) >60 mL/min   Anion gap 9 5 - 15  Urinalysis, Routine w reflex microscopic   Collection Time: 11/21/16  3:53 PM  Result Value Ref Range   Color, Urine YELLOW YELLOW   APPearance HAZY (A) CLEAR   Specific Gravity, Urine 1.011 1.005 - 1.030   pH 5.0 5.0 - 8.0   Glucose, UA >=500 (A) NEGATIVE mg/dL   Hgb urine dipstick SMALL (A) NEGATIVE   Bilirubin Urine NEGATIVE NEGATIVE   Ketones, ur NEGATIVE NEGATIVE mg/dL   Protein, ur 30 (A) NEGATIVE mg/dL   Nitrite NEGATIVE NEGATIVE   Leukocytes, UA MODERATE (A) NEGATIVE   RBC / HPF 0-5 0 - 5 RBC/hpf   WBC, UA TOO NUMEROUS TO COUNT 0 - 5 WBC/hpf   Bacteria, UA RARE (A) NONE SEEN   WBC Clumps PRESENT    Hyaline Casts, UA PRESENT   CBG  monitoring, ED   Collection Time: 11/21/16  6:49 PM  Result Value Ref Range   Glucose-Capillary 498 (H) 65 - 99 mg/dL  CBG monitoring, ED   Collection Time: 11/21/16  8:31 PM  Result Value Ref Range   Glucose-Capillary 373 (H) 65 - 99 mg/dL  CBG monitoring, ED   Collection Time: 11/21/16 10:12 PM  Result Value Ref Range   Glucose-Capillary 365 (H) 65 - 99 mg/dL  Culture, blood (routine x 2)   Collection Time: 11/21/16 11:04 PM  Result Value Ref Range   Specimen Description BLOOD LEFT ANTECUBITAL    Special Requests BOTTLES DRAWN AEROBIC AND ANAEROBIC 10CC EACH    Culture PENDING    Report Status PENDING   Brain natriuretic peptide   Collection Time: 11/21/16 11:04 PM  Result Value Ref Range   B Natriuretic Peptide 127.0 (H) 0.0 - 100.0 pg/mL    Imaging:  No imaging this admission.    CT abdomen and pelvis from last admission: IMPRESSION: 1. Diffuse thickening of the colonic wall, particularly involving the sigmoid colon. Findings favor colitis, infectious or inflammatory. Although there is colonic diverticulosis, the findings are not consistent with acute diverticulitis. 2. Cardiomegaly and coronary artery disease. 3. Status  post cholecystectomy and appendectomy, hysterectomy. 4. Nephrolithiasis. 5. Left upper pole renal cyst. 6.  Aortic atherosclerosis. 7. Small paraumbilical fat containing hernia/hernia 8. Postoperative changes in the lumbar spine. 9. Right mastectomy. Electronically Signed By: Nolon Nations M.D.   On: 11/10/2016 17:33

## 2016-11-21 NOTE — ED Notes (Signed)
CRITICAL VALUE ALERT  Critical value received:  Glucose 591  Date of notification:  11/21/16  Time of notification:  1722  Critical value read back:Yes.    Nurse who received alert:  RMinter, RN  MD notified (1st page):  Dr. Sabra Heck  Time of first page:  1722  MD notified (2nd page):  Time of second page:  Responding MD:  Dr. Sabra Heck  Time MD responded:  Dr. Sabra Heck

## 2016-11-21 NOTE — ED Provider Notes (Signed)
[facility] DEPT Provider Note   CSN: [id number] Arrival date & time: 11/21/16  1543     History   Chief Complaint Chief Complaint  Patient presents with  . Hyperglycemia    HPI [name] is a 82 y.o. female.  Pt is a 82 y/o F with PMH of IDDM, CKD, anemia, IBD, htn, hld, asthma, afib (no anticoagulation d/t falls) who presents to ED for hyperglycemia, states her physical therapist came to her home today and noted her to be jittery, checked blood sugar and was approx 600. States she has been med compliant with her Novolog, Tyler Aas, and Glipizide. +excessive thirst, urinary frequency. No recent diet changes. Recently admitted 11/10/16 for type 2 Dm with CKD stage 4 and htn, colitis afib, and UTI; reports still has some diarrhea since admission. Denies fevers, chills, unexplained weight loss, dizziness, vision or gait changes, Cp, SOB, abd pain, n/v, dysuria, urinary frequency, extremity swelling/numbness/tingling, or any additional concerns.    The history is provided by the patient. No language interpreter was used.  Hyperglycemia  Associated symptoms: increased thirst   Associated symptoms: no abdominal pain, no chest pain, no dizziness, no dysuria, no fever, no nausea, no shortness of breath, no vomiting and no weakness     Past Medical History:  Diagnosis Date  . Anemia   . Aortic stenosis    Mild  . Arthritis   . Asthma   . Atrial flutter ([place]) 2002  . B12 deficiency 06/20/2015  . B12 deficiency 06/20/2015  . Breast carcinoma ([place])   . Cerebrovascular disease    with a 37% LICA; repeat study in 10/2009-no obstructive disease; modest ASVD  . Chronic kidney disease    Creatinine-1.5 in 2010 and 2.5-3 in 2011; 1.5-10/2010;  Klebsiella UTI-10/2010. urine protein 36 mg/dl, mildly elevated  . Decreased bone density   . Depression   . Diabetes mellitus   . Diabetic retinopathy   . DJD (degenerative joint disease) 11/14/2011  . Falls infrequently 01/2010   fracture of  pelvis and left humerus  . Fibromyalgia   . Headaches, cluster   . Hyperlipidemia   . Hypertension   . IBS (irritable bowel syndrome)    diverticulosis, gastroesophageal reflux disease  . Lower back pain   . Obesity 11/14/2011    Patient Active Problem List   Diagnosis Date Noted  . UTI (urinary tract infection) 11/10/2016  . Colitis 11/10/2016  . Elevated troponin I level 11/10/2016  . B12 deficiency 06/20/2015  . UTI (lower urinary tract infection) 05/21/2014  . Acute on chronic renal failure ([place]) 05/21/2014  . DJD (degenerative joint disease) 11/14/2011  . Obesity 11/14/2011  . Inflammatory carcinoma of right breast   . Anemia   . IBS (irritable bowel syndrome)   . Hypertension   . Aortic stenosis   . Chronic kidney disease   . Fibromyalgia   . Cerebrovascular disease   . Atrial fibrillation ([place]) 07/14/2010  . Falls infrequently 01/31/2010  . HYPERLIPIDEMIA 11/15/2009  . Type 2 DM with CKD stage 4 and hypertension ([place]) 10/12/2009  . DEPRESSION/ANXIETY 10/12/2009  . SPINAL STENOSIS, LUMBAR 10/12/2009    Past Surgical History:  Procedure Laterality Date  . APPENDECTOMY    . CATARACT EXTRACTION, BILATERAL     Lens implants  . CENTRAL VENOUS CATHETER TUNNELED INSERTION SINGLE LUMEN    . CHOLECYSTECTOMY    . COLONOSCOPY  2012  . EYE SURGERY  nov 2012   for diabetic retinopathy  . LUMBAR DISC SURGERY  lumbosacral spine procedure x 2  . MASTECTOMY     Right breast for carcinoma    OB History    No data available       Home Medications    Prior to Admission medications   Medication Sig Start Date End Date Taking? Authorizing Provider  albuterol (PROAIR HFA) 108 (90 BASE) MCG/ACT inhaler Inhale 2 puffs into the lungs every 6 (six) hours as needed for wheezing or shortness of breath. 06/16/14   Baird Cancer, PA-C  aspirin EC 81 MG tablet Take 81 mg by mouth daily.    Historical Provider, MD  ciprofloxacin (CIPRO) 500 MG tablet Take 1 tablet (500 mg  total) by mouth daily. 11/11/16 11/21/16  Dron Tanna Furry, MD  esomeprazole (NEXIUM) 40 MG capsule Take 40 mg by mouth daily.     Historical Provider, MD  fenofibrate (TRICOR) 145 MG tablet Take 145 mg by mouth daily.    Historical Provider, MD  furosemide (LASIX) 40 MG tablet Take 40 mg by mouth 2 (two) times daily.     Historical Provider, MD  glipiZIDE (GLUCOTROL) 10 MG tablet Take 10 mg by mouth 2 (two) times daily.     Historical Provider, MD  HYDROcodone-acetaminophen (NORCO) 10-325 MG per tablet Take 1 tablet by mouth every 4 (four) hours as needed for moderate pain.     Historical Provider, MD  insulin aspart (NOVOLOG FLEXPEN) 100 UNIT/ML FlexPen Inject 10 Units into the skin 3 (three) times daily with meals.    Historical Provider, MD  Insulin Degludec (TRESIBA FLEXTOUCH) 200 UNIT/ML SOPN Inject 16 Units into the skin at bedtime.    Historical Provider, MD  lisinopril (PRINIVIL,ZESTRIL) 20 MG tablet Take 1 tablet (20 mg total) by mouth daily. 11/12/16   Dron Tanna Furry, MD  loperamide (IMODIUM) 2 MG capsule Take 4 mg by mouth as needed for diarrhea or loose stools.    Historical Provider, MD  metroNIDAZOLE (FLAGYL) 500 MG tablet Take 1 tablet (500 mg total) by mouth every 8 (eight) hours. 11/11/16 11/21/16  Dron Tanna Furry, MD  PARoxetine (PAXIL) 40 MG tablet Take 40 mg by mouth daily.     Historical Provider, MD  pregabalin (LYRICA) 50 MG capsule Take 50 mg by mouth 3 (three) times daily.     Historical Provider, MD  raloxifene (EVISTA) 60 MG tablet Take 60 mg by mouth daily.    Historical Provider, MD  rosuvastatin (CRESTOR) 40 MG tablet Take 1 tablet (40 mg total) by mouth at bedtime. 07/20/15   Herminio Commons, MD    Family History Family History  Problem Relation Age of Onset  . Alzheimer's disease Mother   . Heart attack Father   . Aneurysm Sister   . Coronary artery disease Brother     Social History Social History  Substance Use Topics  . Smoking  status: Former Research scientist (life sciences)  . Smokeless tobacco: Never Used  . Alcohol use No     Allergies   Mold extract [trichophyton mentagrophyte]; Morphine and related; Niaspan [niacin]; and Pollen extract   Review of Systems Review of Systems  Constitutional: Negative for chills, fever and unexpected weight change.  Eyes: Negative for visual disturbance.  Respiratory: Negative for cough and shortness of breath.   Cardiovascular: Negative for chest pain, palpitations and leg swelling.  Gastrointestinal: Positive for diarrhea. Negative for abdominal pain, nausea and vomiting.  Endocrine: Positive for polydipsia.  Genitourinary: Positive for frequency. Negative for dysuria and urgency.  Musculoskeletal: Negative for back pain  and neck pain.  Skin: Negative for rash.  Neurological: Negative for dizziness, weakness, numbness and headaches.  All other systems reviewed and are negative.    Physical Exam Updated Vital Signs BP (!) 121/45 (BP Location: Left Arm)   Pulse 79   Temp 98.1 F (36.7 C) (Oral)   Resp 16   Ht $R'5\' 11"'yX$  (1.803 m)   Wt 99.8 kg   SpO2 95%   BMI 30.68 kg/m   Physical Exam  Constitutional: She is oriented to person, place, and time. She appears well-developed and well-nourished.  HENT:  Head: Normocephalic and atraumatic.  Mouth/Throat: Oropharynx is clear and moist.  Eyes: EOM are normal. Pupils are equal, round, and reactive to light.  Neck: Normal range of motion. Neck supple.  Cardiovascular: Normal rate, regular rhythm, normal heart sounds and intact distal pulses.   Pulmonary/Chest: Effort normal and breath sounds normal.  Abdominal: Soft. Bowel sounds are normal. There is no rebound and no guarding.  Musculoskeletal: Normal range of motion.  Neurological: She is alert and oriented to person, place, and time.  Skin: Skin is warm and dry.  Nursing note and vitals reviewed.    ED Treatments / Results  Labs (all labs ordered are listed, but only abnormal  results are displayed) Labs Reviewed  CBG MONITORING, ED - Abnormal; Notable for the following:       Result Value   Glucose-Capillary 582 (*)    All other components within normal limits  CBC WITH DIFFERENTIAL/PLATELET  COMPREHENSIVE METABOLIC PANEL  URINALYSIS, ROUTINE W REFLEX MICROSCOPIC  BETA-HYDROXYBUTYRIC ACID    EKG  EKG Interpretation None       Radiology No results found.  Procedures Procedures (including critical care time)  Medications Ordered in ED Medications  sodium chloride 0.9 % bolus 1,000 mL (not administered)     Initial Impression / Assessment and Plan / ED Course  I have reviewed the triage vital signs and the nursing notes.  Pertinent labs & imaging results that were available during my care of the patient were reviewed by me and considered in my medical decision making (see chart for details).  Clinical Course    Pt is a 82 y/o F who presents to ED for hyperglycemia. Will get labs to check renal function, K, anion gap and UA for ketones. Plan to start IVF and re-eval.  5:55 PM Glu 591; K 4.5; Cr 3.46, BUN 85. No anion gap. Plan for 2nd L IVF, 10 units IV regular insulin; pending Ua. Plan to admit  Final Clinical Impressions(s) / ED Diagnoses   Final diagnoses:  None    New Prescriptions New Prescriptions   No medications on file     Ulice Bold, NP 11/21/16 2125    Noemi Chapel, MD 11/22/16 2050

## 2016-11-21 NOTE — ED Provider Notes (Signed)
The patient is an 82 year old female, she has a known history of diabetes as well as a recent admission to the hospital for colitis. She presents to the hospital today after a physical therapist came over to see her at home to help her strengthen her legs and found her to be hyperglycemic, jittery and told her to push the button around her neck as a medical alert. She came by ambulance with a known blood sugar of about 600, she has been having urinary frequency, dry mouth excessive thirst. On exam the patient has clear heart and lung sounds, soft abdomen with mild tenderness on the right side of the abdomen, she's reports that she is still having some diarrhea. She has no lower extremity swelling, mental status is normal, we'll proceed with hydration, proceed with insulin, rule out DKA.  Labs show acute renal failure on chronic renal failure, mild hyponatremia, significant hyperglycemia but no significant anion gap acidosis. Despite the lack of an anion gap the patient is clearly dehydrated, the BUN is significantly elevated as is the creatinine and she will require admission to the hospital. Insulin, fluids, admit.  Medical screening examination/treatment/procedure(s) were conducted as a shared visit with non-physician practitioner(s) and myself.  I personally evaluated the patient during the encounter.  Clinical Impression:   Final diagnoses:  Acute renal failure superimposed on chronic kidney disease, unspecified CKD stage, unspecified acute renal failure type ([place])  Hyperglycemia         Noemi Chapel, MD 11/22/16 2050

## 2016-11-22 ENCOUNTER — Encounter (HOSPITAL_COMMUNITY): Payer: Self-pay | Admitting: Internal Medicine

## 2016-11-22 DIAGNOSIS — N179 Acute kidney failure, unspecified: Secondary | ICD-10-CM | POA: Diagnosis not present

## 2016-11-22 DIAGNOSIS — R739 Hyperglycemia, unspecified: Secondary | ICD-10-CM | POA: Diagnosis not present

## 2016-11-22 DIAGNOSIS — N3000 Acute cystitis without hematuria: Secondary | ICD-10-CM | POA: Diagnosis not present

## 2016-11-22 DIAGNOSIS — E1122 Type 2 diabetes mellitus with diabetic chronic kidney disease: Secondary | ICD-10-CM | POA: Diagnosis not present

## 2016-11-22 LAB — CBC
HEMATOCRIT: 31.2 % — AB (ref 36.0–46.0)
Hemoglobin: 10 g/dL — ABNORMAL LOW (ref 12.0–15.0)
MCH: 27.8 pg (ref 26.0–34.0)
MCHC: 32.1 g/dL (ref 30.0–36.0)
MCV: 86.7 fL (ref 78.0–100.0)
PLATELETS: 258 10*3/uL (ref 150–400)
RBC: 3.6 MIL/uL — ABNORMAL LOW (ref 3.87–5.11)
RDW: 13.5 % (ref 11.5–15.5)
WBC: 6.3 10*3/uL (ref 4.0–10.5)

## 2016-11-22 LAB — GLUCOSE, CAPILLARY
GLUCOSE-CAPILLARY: 98 mg/dL (ref 65–99)
GLUCOSE-CAPILLARY: 199 mg/dL — AB (ref 65–99)
GLUCOSE-CAPILLARY: 238 mg/dL — AB (ref 65–99)
GLUCOSE-CAPILLARY: 187 mg/dL — AB (ref 65–99)
Glucose-Capillary: 203 mg/dL — ABNORMAL HIGH (ref 65–99)
Glucose-Capillary: 320 mg/dL — ABNORMAL HIGH (ref 65–99)

## 2016-11-22 LAB — BASIC METABOLIC PANEL
Anion gap: 8 (ref 5–15)
BUN: 74 mg/dL — ABNORMAL HIGH (ref 6–20)
CHLORIDE: 106 mmol/L (ref 101–111)
CO2: 26 mmol/L (ref 22–32)
CREATININE: 2.83 mg/dL — AB (ref 0.44–1.00)
Calcium: 8.2 mg/dL — ABNORMAL LOW (ref 8.9–10.3)
GFR calc non Af Amer: 15 mL/min — ABNORMAL LOW (ref >60)
GFR, EST AFRICAN AMERICAN: 17 mL/min — AB (ref >60)
Glucose, Bld: 247 mg/dL — ABNORMAL HIGH (ref 65–99)
POTASSIUM: 4.2 mmol/L (ref 3.5–5.1)
SODIUM: 140 mmol/L (ref 135–145)

## 2016-11-22 LAB — C DIFFICILE QUICK SCREEN W PCR REFLEX
C DIFFICILE (CDIFF) INTERP: NOT DETECTED
C Diff antigen: NEGATIVE
C Diff toxin: NEGATIVE

## 2016-11-22 LAB — BRAIN NATRIURETIC PEPTIDE: B NATRIURETIC PEPTIDE 5: 127 pg/mL — AB (ref 0.0–100.0)

## 2016-11-22 LAB — OCCULT BLOOD X 1 CARD TO LAB, STOOL: Fecal Occult Bld: POSITIVE — AB

## 2016-11-22 MED ORDER — FENOFIBRATE 54 MG PO TABS: 54.0000 mg | ORAL_TABLET | Freq: Every day | ORAL | Status: DC

## 2016-11-22 MED ORDER — CEPHALEXIN 500 MG PO CAPS
500.0000 mg | ORAL_CAPSULE | Freq: Two times a day (BID) | ORAL | Status: DC
  Administered 2016-11-22 – 2016-11-23 (×2): 500 mg via ORAL
  Filled 2016-11-22 (×2): qty 1

## 2016-11-22 MED ORDER — INSULIN ASPART 100 UNIT/ML ~~LOC~~ SOLN
0.0000 [IU] | Freq: Three times a day (TID) | SUBCUTANEOUS | Status: DC
  Administered 2016-11-23 (×2): 4 [IU] via SUBCUTANEOUS

## 2016-11-22 NOTE — Assessment & Plan Note (Signed)
Has DM 2 difficult to control. Likely elevated due to chronic issues and also acute infection. Not in DKA. Plan: Start lantus and continue until under better control. Use high dose sliding scale. FSBS q 4 hours or more frequently depending on results.

## 2016-11-22 NOTE — Assessment & Plan Note (Signed)
Admission Cr 3.46. Baseline Cr appears to be 1.7. Likely from dehydration from frequent diarrhea, but could have other causes. Plan: Trial of IVF. If no improvement then investigate for other causes. Will hold ACE inhibitor initially.

## 2016-11-22 NOTE — Plan of Care (Signed)
Problem: Food- and Nutrition-Related Knowledge Deficit (NB-1.1) Goal: Nutrition education Formal process to instruct or train a patient/client in a skill or to impart knowledge to help patients/clients voluntarily manage or modify food choices and eating behavior to maintain or improve health. Outcome: Adequate for Discharge  RD consulted for nutrition education regarding diabetes. Patient says she doesn't cook anymore. Her sons take her to the grocery store and she buy foods that are easy to "heat and eat" such as [place] [employer] [place] and Egg Biscuits".  She also is a participant in the Meals on Wheels program.  She usually eats 2 meals daily and snacks such as Lance Cheeze Peanut butter crackers. She consumes mostly water or diet drink with lunch and coffee and milk or juice with breakfast.    Lab Results  Component Value Date   HGBA1C 8.6 (H) 05/22/2014    RD provided "Carbohydrate Counting for People with Diabetes and My Plate Method" handouts. Discussed different food groups and their effects on blood sugar, emphasizing carbohydrate-containing foods. Provided list of carbohydrates and recommended serving sizes of common foods.  Discussed importance of controlled and consistent carbohydrate intake throughout the day. Provided examples of ways to balance meals/snacks and encouraged intake of high-fiber, whole grain complex carbohydrates. Teach back method used.  Expect  good compliance.  Body mass index is 30.78 kg/m. Pt meets criteria for obesity class I based on current BMI.  Recommend obtain current A1C%.    Colman Cater MS,RD,CSG,LDN Office: [phone] Pager: [phone]

## 2016-11-22 NOTE — Assessment & Plan Note (Signed)
Admitted earlier in December 2017 with possible colitis. Plan: IVF. C diff ordered. Unable to order stool cultures in electronic orders but consider in am.

## 2016-11-22 NOTE — Assessment & Plan Note (Signed)
Plan: Hold oral diabetes medications.  Check fingerstick blood sugars q 4hrs initially.  Sliding scale insulin.   Check A1c.

## 2016-11-22 NOTE — Care Management Note (Signed)
Case Management Note  Patient Details  Name: [name] MRN: [id number] Date of Birth: 10-Apr-1933  Subjective/Objective:                  Pt admitted with AKI. She is from home, lives alone and has children who live near-by. Pt uses walker and WC for mobility. She is ind with ADL's and received HH PT services through [facility]. Romualdo Bolk, of [facility], aware of admission. Pt plans to return home with resumption of [facility] services at DC. Pt asking about neb treatments at home at DC.   Action/Plan: Anticipate return home with [facility] services. Pt will need order to resume services at DC. CM will discuss neb machine and treatments with attending prior to DC.   Expected Discharge Date:     11/22/2016             Expected Discharge Plan:  [place]  In-House Referral:  NA  Discharge planning Services  CM Consult  Post Acute Care Choice:  Home Health, Resumption of Svcs/PTA Provider Choice offered to:  Patient  HH Arranged:  PT HH Agency:   [facility]  Status of Service:  In process, will continue to follow  Sherald Barge, RN 11/22/2016, 1:16 PM

## 2016-11-22 NOTE — Progress Notes (Signed)
PROGRESS NOTE  [name]:[id number] DOB: 06-10-1933 DOA: 11/21/2016 PCP: [name]., MD  Brief Narrative: 82 year old woman PMH diabetes, atrial fibrillation, chronic kidney disease presented with elevated blood sugars at home. Admitted for hyperglycemia with blood sugar over 500, acute kidney injury, acute cystitis.  Assessment/Plan: 1. Acute kidney injury, prerenal pattern. Superimposed on chronic kidney disease stage IV.  Likely secondary to severe hyperglycemia, poor oral intake, complicated by Lasix and lisinopril. Also consider recent Cipro.  Improving with IV fluids. Plan to continue IV fluids, follow I/O. Expect recovery to baseline. 2. Acute cystitis without hematuria. Culture pending.  Change to oral antibiotics, 3 day course total 3. Hyperglycemia (582 on admission), Diabetes with diabetic retinopathy.  CBG now well controlled. Anion gap normal. 4. Atrial fibrillation. CHA2DS2-VASc 5. Not on anticoagulation secondary to falls. 5. Anemia of chronic disease (chronic kidney disease, diabetes). Stable.  6. PMH breast cancer, fibromyalgia, irritable bowel, obesity 7. Discharged December 10, treated for mild colitis with Cipro and Flagyl. C. difficile pending.   Overall improving. Anticipate discharge next 24 hours.  DVT prophylaxis: heparin Code Status: partial Family Communication: none Disposition Plan: home  Murray Hodgkins, MD  [facility] Direct contact: [phone] --Via amion app OR  --[email or web address]; password [email or web address]  7PM-7AM contact night coverage as above 11/22/2016, 10:16 AM  LOS: 1 day   Consultants:    Procedures:    Antimicrobials:  Ceftriaxone 12/20 >>  Interval history/Subjective: Feeling somewhat better. No abdominal pain. No nausea or vomiting. Breathing okay.  Objective: Vitals:   11/21/16 2342 11/22/16 0108 11/22/16 0455 11/22/16 0600  BP:   (!) 96/56   Pulse:   75   Resp: 18  18   Temp:   97.5 F (36.4 C)     TempSrc:   Oral   SpO2:  97% 96%   Weight:    100.1 kg (220 lb 10.9 oz)  Height:        Intake/Output Summary (Last 24 hours) at 11/22/16 1016 Last data filed at 11/22/16 0300  Gross per 24 hour  Intake          2389.67 ml  Output                0 ml  Net          2389.67 ml     Filed Weights   11/21/16 1556 11/21/16 2237 11/22/16 0600  Weight: 99.8 kg (220 lb) 101.1 kg (222 lb 14.2 oz) 100.1 kg (220 lb 10.9 oz)    Exam:    Constitutional:  . Appears calm and comfortable ENMT:  . grossly normal hearing  Respiratory:  . CTA bilaterally, no w/r/r.  . Respiratory effort normal.  Cardiovascular:  . RRR, no m/r/g . No LE extremity edema   . Telemetry SR Abdomen:  . no tenderness or masses Psychiatric:  . judgement and insight appear normal . Mental status o Mood, affect appropriate  I have personally reviewed following labs and imaging studies:  Blood sugars much improved  BUN and creatinine trending down.  Hemoglobin stable.  Scheduled Meds: . aspirin EC  81 mg Oral Daily  . cefTRIAXone (ROCEPHIN)  IV  1 g Intravenous Q24H  . fenofibrate  54 mg Oral Daily  . heparin  5,000 Units Subcutaneous Q8H  . insulin aspart  0-20 Units Subcutaneous Q4H  . insulin glargine  40 Units Subcutaneous BID  . pantoprazole sodium  40 mg Oral Daily  . PARoxetine  40 mg Oral Daily  .  pregabalin  50 mg Oral TID  . raloxifene  60 mg Oral Daily  . rosuvastatin  40 mg Oral QHS  . sodium chloride flush  3 mL Intravenous Q12H   Continuous Infusions: . sodium chloride 100 mL/hr at 11/21/16 2338    Principal Problem:   Acute kidney injury superimposed on chronic kidney disease ([place]) Active Problems:   Type 2 DM with CKD stage 4 and hypertension (HCC)   B12 deficiency   Hyperglycemia   Acute cystitis without hematuria   Diarrhea of presumed infectious origin   LOS: 1 day

## 2016-11-22 NOTE — Assessment & Plan Note (Addendum)
Chronic mild B-12 deficiency anemia. Plan: Monitor. Home treatment with B-12.

## 2016-11-22 NOTE — Assessment & Plan Note (Signed)
Suspected. Plan: Urine and blood cultures drawn. IV ceftriaxone empirically.

## 2016-11-23 DIAGNOSIS — N189 Chronic kidney disease, unspecified: Secondary | ICD-10-CM | POA: Diagnosis not present

## 2016-11-23 DIAGNOSIS — N179 Acute kidney failure, unspecified: Principal | ICD-10-CM

## 2016-11-23 LAB — BASIC METABOLIC PANEL
ANION GAP: 5 (ref 5–15)
BUN: 55 mg/dL — ABNORMAL HIGH (ref 6–20)
CALCIUM: 8.3 mg/dL — AB (ref 8.9–10.3)
CO2: 26 mmol/L (ref 22–32)
Chloride: 110 mmol/L (ref 101–111)
Creatinine, Ser: 2.32 mg/dL — ABNORMAL HIGH (ref 0.44–1.00)
GFR calc Af Amer: 22 mL/min — ABNORMAL LOW (ref >60)
GFR, EST NON AFRICAN AMERICAN: 19 mL/min — AB (ref >60)
Glucose, Bld: 190 mg/dL — ABNORMAL HIGH (ref 65–99)
POTASSIUM: 5.2 mmol/L — AB (ref 3.5–5.1)
SODIUM: 141 mmol/L (ref 135–145)

## 2016-11-23 LAB — HEMOGLOBIN A1C
HEMOGLOBIN A1C: 12.7 % — AB (ref 4.8–5.6)
Mean Plasma Glucose: 318 mg/dL

## 2016-11-23 LAB — URINE CULTURE

## 2016-11-23 LAB — GLUCOSE, CAPILLARY
GLUCOSE-CAPILLARY: 176 mg/dL — AB (ref 65–99)
GLUCOSE-CAPILLARY: 164 mg/dL — AB (ref 65–99)
Glucose-Capillary: 168 mg/dL — ABNORMAL HIGH (ref 65–99)
Glucose-Capillary: 179 mg/dL — ABNORMAL HIGH (ref 65–99)

## 2016-11-23 MED ORDER — CEFPODOXIME PROXETIL 200 MG PO TABS: 200.0000 mg | ORAL_TABLET | Freq: Two times a day (BID) | ORAL | 0 refills | Status: DC

## 2016-11-23 MED ORDER — FUROSEMIDE 40 MG PO TABS: 40.0000 mg | ORAL_TABLET | Freq: Every day | ORAL | Status: DC

## 2016-11-23 MED ORDER — HEPARIN SOD (PORK) LOCK FLUSH 100 UNIT/ML IV SOLN
500.0000 [IU] | INTRAVENOUS | Status: DC | PRN
  Administered 2016-11-23: 500 [IU]
  Filled 2016-11-23: qty 5

## 2016-11-23 MED ORDER — SODIUM CHLORIDE 0.9 % IV BOLUS (SEPSIS)
500.0000 mL | Freq: Once | INTRAVENOUS | Status: AC
  Administered 2016-11-23: 500 mL via INTRAVENOUS

## 2016-11-23 MED ORDER — FUROSEMIDE 10 MG/ML IJ SOLN
20.0000 mg | Freq: Once | INTRAMUSCULAR | Status: AC
Start: 2016-11-23 — End: 2016-11-23
  Administered 2016-11-23: 20 mg via INTRAVENOUS
  Filled 2016-11-23: qty 2

## 2016-11-23 MED ORDER — METRONIDAZOLE 500 MG PO TABS: 500.0000 mg | ORAL_TABLET | Freq: Three times a day (TID) | ORAL | 0 refills | Status: AC

## 2016-11-23 MED ORDER — SODIUM POLYSTYRENE SULFONATE 15 GM/60ML PO SUSP
15.0000 g | Freq: Once | ORAL | Status: AC
  Administered 2016-11-23: 15 g via ORAL
  Filled 2016-11-23: qty 60

## 2016-11-23 MED ORDER — BISACODYL 10 MG RE SUPP
10.0000 mg | Freq: Once | RECTAL | Status: AC
  Administered 2016-11-23: 10 mg via RECTAL
  Filled 2016-11-23: qty 1

## 2016-11-23 MED ORDER — CARVEDILOL 3.125 MG PO TABS: 3.1250 mg | ORAL_TABLET | Freq: Two times a day (BID) | ORAL | 0 refills | Status: DC

## 2016-11-23 NOTE — Progress Notes (Signed)
Patient is to be discharged home and in stable condition. Port deaccessed by Vista Deck, RN. Discharged teaching discussed, pt and family verbalize understanding. No further questions or concerns at this time.  Celestia Khat, RN

## 2016-11-23 NOTE — Care Management Important Message (Signed)
Important Message  Patient Details  Name: [name] MRN: [id number] Date of Birth: [birth year]   Medicare Important Message Given:  Yes    Sherald Barge, RN 11/23/2016, 10:05 AM

## 2016-11-23 NOTE — Discharge Instructions (Signed)
Follow with Primary MD [name]., MD in 3-4 days   Get CBC, CMP, 2 view Chest X ray checked  by Primary MD  in 3-4 days ( we routinely change or add medications that can affect your baseline labs and fluid status, therefore we recommend that you get the mentioned basic workup next visit with your PCP, your PCP may decide not to get them or add new tests based on their clinical decision)   Activity: As tolerated with Full fall precautions use walker/cane & assistance as needed   Disposition Home     Diet:   Diet Carb Modified .  Accuchecks 4 times/day, Once in AM empty stomach and then before each meal. Log in all results and show them to your Prim.MD in 3 days. If any glucose reading is under 80 or above 300 call your Prim MD immidiately. Follow Low glucose instructions for glucose under 80 as instructed.  For Heart failure patients - Check your Weight same time everyday, if you gain over 2 pounds, or you develop in leg swelling, experience more shortness of breath or chest pain, call your Primary MD immediately. Follow Cardiac Low Salt Diet and 1.5 lit/day fluid restriction.   On your next visit with your primary care physician please Get Medicines reviewed and adjusted.   Please request your Prim.MD to go over all Hospital Tests and Procedure/Radiological results at the follow up, please get all Hospital records sent to your Prim MD by signing hospital release before you go home.   If you experience worsening of your admission symptoms, develop shortness of breath, life threatening emergency, suicidal or homicidal thoughts you must seek medical attention immediately by calling 911 or calling your MD immediately  if symptoms less severe.  You Must read complete instructions/literature along with all the possible adverse reactions/side effects for all the Medicines you take and that have been prescribed to you. Take any new Medicines after you have completely understood and  accpet all the possible adverse reactions/side effects.   Do not drive, operate heavy machinery, perform activities at heights, swimming or participation in water activities or provide baby sitting services if your were admitted for syncope or siezures until you have seen by Primary MD or a Neurologist and advised to do so again.  Do not drive when taking Pain medications.    Do not take more than prescribed Pain, Sleep and Anxiety Medications  Special Instructions: If you have smoked or chewed Tobacco  in the last 2 yrs please stop smoking, stop any regular Alcohol  and or any Recreational drug use.  Wear Seat belts while driving.   Please note  You were cared for by a hospitalist during your hospital stay. If you have any questions about your discharge medications or the care you received while you were in the hospital after you are discharged, you can call the unit and asked to speak with the hospitalist on call if the hospitalist that took care of you is not available. Once you are discharged, your primary care physician will handle any further medical issues. Please note that NO REFILLS for any discharge medications will be authorized once you are discharged, as it is imperative that you return to your primary care physician (or establish a relationship with a primary care physician if you do not have one) for your aftercare needs so that they can reassess your need for medications and monitor your lab values.

## 2016-11-23 NOTE — Progress Notes (Addendum)
Inpatient Diabetes Program Recommendations  AACE/ADA: New Consensus Statement on Inpatient Glycemic Control (2015)  Target Ranges:  Prepandial:   less than 140 mg/dL      Peak postprandial:   less than 180 mg/dL (1-2 hours)      Critically ill patients:  140 - 180 mg/dL   Results for JANISE, GORA (MRN [id number]) as of 11/23/2016 06:42  Ref. Range 11/22/2016 03:56  Hemoglobin A1C Latest Ref Range: 4.8 - 5.6 % 12.7 (H)   Results for LANETTE, ELL (MRN [id number]) as of 11/23/2016 10:14  Ref. Range 11/23/2016 06:27  Glucose Latest Ref Range: 65 - 99 mg/dL 190 (H)     Admit with: AKI/ Hyperglycemia/ Cystitis  History: DM  Home DM Meds: Tresiba (insulin degludec) 16 units QHS       Novolog 16 units TID       Glipizide 10 mg BID  Current Insulin Orders: Novolog Resistant Correction Scale/ SSI (0-20 units) TID AC        MD- Note patient received a total of 80 units Lantus yesterday (12/21).  Was given 40 units Lantus at midnight and another 40 units Lantus at 9am.  Glucose this AM 190 mg/dl.  Per records, patient takes [place] insulin 16 units QHS in addition to Novolog at home.  Current A1c of 12.67% shows poor control.  Needs follow up with her PCP: Dr. Gerarda Fraction.  Please consider the following:  Start Lantus insulin- Lantus 30 units daily (0.3 units/kg dosing based on weight of 103 kg)     --Will follow patient during hospitalization--  Wyn Quaker RN, MSN, CDE Diabetes Coordinator Inpatient Glycemic Control Team Team Pager: [phone] (8a-5p)

## 2016-11-23 NOTE — Care Management Note (Signed)
Case Management Note  Patient Details  Name: [name] MRN: [id number] Date of Birth: 1933/04/26   Expected Discharge Date:    11/23/2016              Expected Discharge Plan:  [place]  In-House Referral:  Financial Counselor  Discharge planning Services  CM Consult  Post Acute Care Choice:  [place], Resumption of Svcs/PTA Provider Choice offered to:  Patient  DME Arranged:    DME Agency:     HH Arranged:  PT, RN, Nurse's Aide, Social Work [employer] Agency:  [place]  Status of Service:  Completed, signed off   Additional Comments: Pt discharging home today with [facility] services through [facility]. Blake Divine, aware of DC today and addition to services. Pt's family very concerned about pt discharging home. They feel she is unsafe at home. They are unable to stay with the pt in her home and will not have pt stay with them in their homes due to their own obligations'. PT's recommendation for [facility] discussed with pt and family. Family understands pt does not have skilled need for SNF and pt/family unable to pay OOP for PD care, SNF or ALF. FC involved in mtg and discussed pt's ability to qualify for medicaid. Per pt's reported income pt does not qualify. CM ensured pt and family max HH services would be referred. Pt understands [place] has 48hrs to make first visit. Pt has life-alert. Pt's son coming to transport pt home.  Sherald Barge, RN 11/23/2016, 12:15 PM

## 2016-11-23 NOTE — Discharge Summary (Signed)
[name] VQX:[id number] DOB: 1933-05-16 DOA: 11/21/2016  PCP: [name]., MD  Admit date: 11/21/2016  Discharge date: 11/23/2016  Admitted From: Home   Disposition:  Home   Recommendations for Outpatient Follow-up:   Follow up with PCP in 1-2 weeks  PCP Please obtain BMP/CBC, 2 view CXR in 1week,  (see Discharge instructions)   PCP Please follow up on the following pending results: Get BMP checked on Tuesday   Home Health: PT, RN, aide   Equipment/Devices: None  Consultations: None Discharge Condition: Stable   CODE STATUS: Partial   Diet Recommendation: Diet Carb   Chief Complaint  Patient presents with  . Hyperglycemia     Brief history of present illness from the day of admission and additional interim summary    82 year old woman PMH diabetes, atrial fibrillation, chronic kidney disease presented with elevated blood sugars at home. Admitted for hyperglycemia with blood sugar over 500, acute kidney injury, acute cystitis.   Hospital issues addressed     1.ARF with hyperkalemia. History of underlying CK D stage IV. Due to combination of hyperglycemia, poor oral intake and diarrhea causing dehydration in the setting of Lasix and ACE inhibitor.  Renal function has improved remarkably with IV fluids, she is much better, will discharge her on half home dose Lasix, continue to hold ACE inhibitor, potassium. Normal and she received Kayexalate and gentle IV Lasix one dose prior to discharge, request PCP to recheck BMP in 3-4 days and readdress her medications.Question if she has mild RTA 4. May benefit from outpatient renal follow-up.   2.UTI. [employer] course.  3. Diarrhea with nonspecific CT scan colonic thickening. She did have diarrhea before coming in, clinically could've had mild  diverticulitis but did not show as such on CT scan, give her 5 more days of Vantin and Flagyl. Request PCP to arrange outpatient GI follow-up for a routine outpatient colonoscopy.  4. Hyperglycemia upon admission. Stable. Resume home regimen and follow with PCP for glycemic control.  5. Chronic atrial fibrillation [place] vasc 2 score of 5 .not on anticoagulation due to falls, Will place her on low-dose Coreg and discharge home.  6. Hypertension. Placed on low-dose Coreg. ACE inhibitor held due to renal failure.  7. Dyslipidemia. On statin continue.    Past medical history of anemia of chronic disease, breast cancer, fibromyalgia, IBS and obesity. Stable follow with PCP.    Discharge diagnosis     Principal Problem:   Acute kidney injury superimposed on chronic kidney disease (HCC) Active Problems:   Type 2 DM with CKD stage 4 and hypertension (HCC)   B12 deficiency   Hyperglycemia   Acute cystitis without hematuria   Diarrhea of presumed infectious origin    Discharge instructions    Discharge Instructions    Discharge instructions    Complete by:  As directed    Follow with Primary MD [name]., MD in 3-4 days   Get CBC, CMP, 2 view Chest X ray checked  by Primary MD  in 3-4  days ( we routinely change or add medications that can affect your baseline labs and fluid status, therefore we recommend that you get the mentioned basic workup next visit with your PCP, your PCP may decide not to get them or add new tests based on their clinical decision)   Activity: As tolerated with Full fall precautions use walker/cane & assistance as needed   Disposition Home     Diet:   Diet Carb Modified .  Accuchecks 4 times/day, Once in AM empty stomach and then before each meal. Log in all results and show them to your Prim.MD in 3 days. If any glucose reading is under 80 or above 300 call your Prim MD immidiately. Follow Low glucose instructions for glucose under 80 as  instructed.  For Heart failure patients - Check your Weight same time everyday, if you gain over 2 pounds, or you develop in leg swelling, experience more shortness of breath or chest pain, call your Primary MD immediately. Follow Cardiac Low Salt Diet and 1.5 lit/day fluid restriction.   On your next visit with your primary care physician please Get Medicines reviewed and adjusted.   Please request your Prim.MD to go over all Hospital Tests and Procedure/Radiological results at the follow up, please get all Hospital records sent to your Prim MD by signing hospital release before you go home.   If you experience worsening of your admission symptoms, develop shortness of breath, life threatening emergency, suicidal or homicidal thoughts you must seek medical attention immediately by calling 911 or calling your MD immediately  if symptoms less severe.  You Must read complete instructions/literature along with all the possible adverse reactions/side effects for all the Medicines you take and that have been prescribed to you. Take any new Medicines after you have completely understood and accpet all the possible adverse reactions/side effects.   Do not drive, operate heavy machinery, perform activities at heights, swimming or participation in water activities or provide baby sitting services if your were admitted for syncope or siezures until you have seen by Primary MD or a Neurologist and advised to do so again.  Do not drive when taking Pain medications.    Do not take more than prescribed Pain, Sleep and Anxiety Medications  Special Instructions: If you have smoked or chewed Tobacco  in the last 2 yrs please stop smoking, stop any regular Alcohol  and or any Recreational drug use.  Wear Seat belts while driving.   Please note  You were cared for by a hospitalist during your hospital stay. If you have any questions about your discharge medications or the care you received while you were  in the hospital after you are discharged, you can call the unit and asked to speak with the hospitalist on call if the hospitalist that took care of you is not available. Once you are discharged, your primary care physician will handle any further medical issues. Please note that NO REFILLS for any discharge medications will be authorized once you are discharged, as it is imperative that you return to your primary care physician (or establish a relationship with a primary care physician if you do not have one) for your aftercare needs so that they can reassess your need for medications and monitor your lab values.   Increase activity slowly    Complete by:  As directed       Discharge Medications   Allergies as of 11/23/2016      Reactions   Mold Extract [  trichophyton Mentagrophyte] Other (See Comments)   Reaction:  Itchy, watery eyes/sneezing    Morphine And Related Other (See Comments)   Reaction:  Drowsiness. "I didn't wake up for almost 1 week" after taking morphine.    Niaspan [niacin] Other (See Comments)   Reaction:  Flushing    Pollen Extract Other (See Comments)   Reaction:  Itchy, watery eyes/sneezing       Medication List    STOP taking these medications   ciprofloxacin 500 MG tablet Commonly known as:  CIPRO   lisinopril 20 MG tablet Commonly known as:  PRINIVIL,ZESTRIL     TAKE these medications   albuterol 108 (90 Base) MCG/ACT inhaler Commonly known as:  PROAIR HFA Inhale 2 puffs into the lungs every 6 (six) hours as needed for wheezing or shortness of breath.   aspirin EC 81 MG tablet Take 81 mg by mouth daily.   carvedilol 3.125 MG tablet Commonly known as:  COREG Take 1 tablet (3.125 mg total) by mouth 2 (two) times daily with a meal.   cefpodoxime 200 MG tablet Commonly known as:  VANTIN Take 1 tablet (200 mg total) by mouth 2 (two) times daily.   cyanocobalamin 1000 MCG/ML injection Commonly known as:  (VITAMIN B-12) Inject 1,000 mcg into the muscle  every 30 (thirty) days.   esomeprazole 40 MG capsule Commonly known as:  NEXIUM Take 40 mg by mouth daily.   fenofibrate 145 MG tablet Commonly known as:  TRICOR Take 145 mg by mouth daily.   furosemide 40 MG tablet Commonly known as:  LASIX Take 1 tablet (40 mg total) by mouth daily. What changed:  when to take this   glipiZIDE 10 MG tablet Commonly known as:  GLUCOTROL Take 10 mg by mouth 2 (two) times daily.   HYDROcodone-acetaminophen 10-325 MG tablet Commonly known as:  NORCO Take 1 tablet by mouth every 4 (four) hours as needed for moderate pain.   loperamide 2 MG capsule Commonly known as:  IMODIUM Take 4 mg by mouth as needed for diarrhea or loose stools.   metroNIDAZOLE 500 MG tablet Commonly known as:  FLAGYL Take 1 tablet (500 mg total) by mouth every 8 (eight) hours.   NOVOLOG FLEXPEN 100 UNIT/ML FlexPen Generic drug:  insulin aspart Inject 16 Units into the skin 3 (three) times daily with meals.   PARoxetine 40 MG tablet Commonly known as:  PAXIL Take 40 mg by mouth daily.   pregabalin 50 MG capsule Commonly known as:  LYRICA Take 50 mg by mouth 3 (three) times daily.   raloxifene 60 MG tablet Commonly known as:  EVISTA Take 60 mg by mouth daily.   rosuvastatin 40 MG tablet Commonly known as:  CRESTOR Take 1 tablet (40 mg total) by mouth at bedtime.   TRESIBA FLEXTOUCH 200 UNIT/ML Sopn Generic drug:  Insulin Degludec Inject 16 Units into the skin at bedtime.       Follow-up Information    [facility] Follow up.   Contact information: [place] [address] [phone]        [name]., MD. Schedule an appointment as soon as possible for a visit in 4 day(s).   Specialty:  Internal Medicine Why:  get CBC, BMP checked Contact information: [address] [place] [address] [phone]           Major procedures and Radiology Reports - PLEASE review detailed and final  reports thoroughly  -  Ct Abdomen Pelvis Wo Contrast  Result Date: 11/10/2016 CLINICAL DATA:  Confusion, diarrhea, abdominal pain for 3 days. History of diabetes, breast cancer, IBS. Cholecystectomy, appendectomy. EXAM: CT ABDOMEN AND PELVIS WITHOUT CONTRAST TECHNIQUE: Multidetector CT imaging of the abdomen and pelvis was performed following the standard protocol without IV contrast. COMPARISON:  05/20/2014 FINDINGS: Lower chest: No pulmonary nodules, pleural effusions, or infiltrates. The heart is enlarged. There is calcification of coronary vessels and mitral annulus. No pericardial effusion. Hepatobiliary: Status post cholecystectomy. Homogeneous appearance of the liver. No focal liver lesion. Pancreas: Unremarkable. No pancreatic ductal dilatation or surrounding inflammatory changes. Spleen: Normal in size without focal abnormality. Adrenals/Urinary Tract: Adrenal glands have a normal appearance. Punctate intrarenal calculi identified within the kidneys bilaterally. Within the upper pole of the left kidney there is a low-attenuation lesion consistent with cysts measuring 1.6 cm. No hydronephrosis or ureteral stone. Stomach/Bowel: Stomach and small bowel loops are normal in appearance. Status post appendectomy. There are numerous diverticulum but no evidence for acute diverticulitis. There is diffuse thickening of the sigmoid colon. Mild thickening of the transverse, ascending, and descending colon as well. No discrete colonic mass identified. Vascular/Lymphatic: There is atherosclerotic calcification of the abdominal aorta. No aneurysm. No retroperitoneal or mesenteric adenopathy. Reproductive: Status post hysterectomy.  No adnexal mass. Other: Small paraumbilical fat containing hernia or hernias. Postoperative changes in the lower anterior abdominal wall. No free pelvic fluid. Right mastectomy. Musculoskeletal: Status post posterior fusion of the lumbar spine. Significant spondylosis of the  lumbar levels. IMPRESSION: 1. Diffuse thickening of the colonic wall, particularly involving the sigmoid colon. Findings favor colitis, infectious or inflammatory. Although there is colonic diverticulosis, the findings are not consistent with acute diverticulitis. 2. Cardiomegaly and coronary artery disease. 3. Status post cholecystectomy and appendectomy, hysterectomy. 4. Nephrolithiasis. 5. Left upper pole renal cyst. 6.  Aortic atherosclerosis. 7. Small paraumbilical fat containing hernia/hernia 8. Postoperative changes in the lumbar spine. 9. Right mastectomy. Electronically Signed   By: Nolon Nations M.D.   On: 11/10/2016 17:33    Micro Results     Recent Results (from the past 240 hour(s))  Culture, blood (routine x 2)     Status: None (Preliminary result)   Collection Time: 11/21/16 11:04 PM  Result Value Ref Range Status   Specimen Description BLOOD LEFT ANTECUBITAL  Final   Special Requests BOTTLES DRAWN AEROBIC AND ANAEROBIC 10CC EACH  Final   Culture NO GROWTH 2 DAYS  Final   Report Status PENDING  Incomplete  Culture, blood (routine x 2)     Status: None (Preliminary result)   Collection Time: 11/21/16 11:23 PM  Result Value Ref Range Status   Specimen Description BLOOD LEFT ANTECUBITAL  Final   Special Requests BOTTLES DRAWN AEROBIC AND ANAEROBIC 6CC EACH  Final   Culture NO GROWTH 2 DAYS  Final   Report Status PENDING  Incomplete  C difficile quick scan w PCR reflex     Status: None   Collection Time: 11/22/16  9:30 AM  Result Value Ref Range Status   C Diff antigen NEGATIVE NEGATIVE Final   C Diff toxin NEGATIVE NEGATIVE Final   C Diff interpretation No C. difficile detected.  Final    Today   Subjective    [name] today has no headache,no chest abdominal pain,no new weakness tingling or numbness, feels much better wants to go home today.     Objective   Blood pressure (!) 167/53, pulse 65, temperature 98.5 F (36.9 C), temperature source  Oral, resp. rate  18, height $RemoveBe'5\' 11"'pMixaYEYo$  (1.803 m), weight 103.2 kg (227 lb 8 oz), SpO2 100 %.   Intake/Output Summary (Last 24 hours) at 11/23/16 1047 Last data filed at 11/23/16 0612  Gross per 24 hour  Intake                0 ml  Output              850 ml  Net             -850 ml    Exam Awake Alert, Oriented x 3, No new F.N deficits, Normal affect [place].AT,PERRAL Supple Neck,No JVD, No cervical lymphadenopathy appriciated.  Symmetrical Chest wall movement, Good air movement bilaterally, CTAB RRR,No Gallops,Rubs or new Murmurs, No Parasternal Heave +ve B.Sounds, Abd Soft, Non tender, No organomegaly appriciated, No rebound -guarding or rigidity. No Cyanosis, Clubbing or edema, No new Rash or bruise   Data Review   CBC w Diff:  Lab Results  Component Value Date   WBC 6.3 11/22/2016   HGB 10.0 (L) 11/22/2016   HCT 31.2 (L) 11/22/2016   PLT 258 11/22/2016   LYMPHOPCT 20 11/21/2016   MONOPCT 7 11/21/2016   EOSPCT 2 11/21/2016   BASOPCT 0 11/21/2016    CMP:  Lab Results  Component Value Date   NA 141 11/23/2016   K 5.2 (H) 11/23/2016   CL 110 11/23/2016   CO2 26 11/23/2016   BUN 55 (H) 11/23/2016   CREATININE 2.32 (H) 11/23/2016   CREATININE 1.36 (H) 07/26/2011   PROT 6.4 (L) 11/21/2016   ALBUMIN 3.1 (L) 11/21/2016   BILITOT 0.2 (L) 11/21/2016   ALKPHOS 38 11/21/2016   AST 14 (L) 11/21/2016   ALT 9 (L) 11/21/2016  .   Total Time in preparing paper work, data evaluation and todays exam - 35 minutes  Thurnell Lose M.D on 11/23/2016 at 10:47 AM  [facility]   Office  [phone]

## 2016-11-23 NOTE — Evaluation (Signed)
Physical Therapy Evaluation Patient Details Name: [name] MRN: [id number] DOB: 03-12-1933 Today's Date: 11/23/2016   History of Present Illness  The patient is an 82 yo woman with DM type 2, CKD stage 4, history of breast cancer diagnosed 2007, B-12 deficiency anemia, who was recently admitted 11/10/16 - 11/11/16 with a UTI who presents today because her fingerstick glucose (FSBS) was high, and also because she has had some abdominal pain. She states she has felt bad for the last 3 days, with severe fatigue.  Clinical Impression  Pt was received sitting up eating her breakfast. She reports that she has been up walking since her admission and was not willing to ambulate outside her room at this time. She was agreeable to walking in her room, however. She demonstrates independence with all bed mobility and transfers, and she was able to ambulate around her room with RW and no more than supervision. She currently has no motivation to ambulate at home as she is comfortable using her w/c for mobility, however she is able to ambulate short distances as needed for transfers, etc. Due to her history of frequent falls, she would benefit from continued HHPT to address her safety with transfers and to encourage use of RW when out of her w/c. Pt is in agreement with this and states she is currently at her baseline of function. She does not require skilled PT while at [facility] as all of her PT needs should be addressed at discharge.     Follow Up Recommendations Home health PT;Other (comment) (recommend pt continue with prior HHPT arrangements to address safety with ambulation at home )    Equipment Recommendations       Recommendations for Other Services       Precautions / Restrictions Restrictions Weight Bearing Restrictions: No      Mobility  Bed Mobility Overal bed mobility: Independent             General bed mobility comments: Pt sitting in bed eating her breakfast upon  arrival  Transfers Overall transfer level: Needs assistance Equipment used: Rolling walker (2 wheeled) Transfers: Sit to/from Stand Sit to Stand: Independent         General transfer comment: Pt able to stand without AD, and only used RW due to therapist request based on pt's history of frequent falls.   Ambulation/Gait Ambulation/Gait assistance: Supervision Ambulation Distance (Feet): 15 Feet Assistive device: Rolling walker (2 wheeled) Gait Pattern/deviations: Decreased step length - left;Decreased step length - right;Trunk flexed     General Gait Details: Pt able to maneuver around her room with RW and without any difficulty. She states she is at her baseline with this function.  Stairs            Wheelchair Mobility    Modified Rankin (Stroke Patients Only)       Balance Overall balance assessment: Needs assistance Sitting-balance support: Feet supported Sitting balance-Leahy Scale: Good     Standing balance support: No upper extremity supported Standing balance-Leahy Scale: Fair Standing balance comment: Balance deficits in standing are at pt's baseline performance                             Pertinent Vitals/Pain Pain Assessment: No/denies pain    Home Living Family/patient expects to be discharged to:: Private residence Living Arrangements: Alone Available Help at Discharge: Family;Available PRN/intermittently Type of Home: Mobile home Home Access: Stairs to enter   Entrance  Stairs-Number of Steps: 2-3 STE with assistance from family Home Layout: One level Home Equipment: Walker - 2 wheels;Shower seat;Wheelchair - manual      Prior Function Level of Independence: Needs assistance   Gait / Transfers Assistance Needed: Pt typically using manual w/c to get around her home, walking only 5-10 ft at a time as needed to perform transfers.   ADL's / Homemaking Assistance Needed: needs assistance with driving, going to the store, etc.    Comments: Pt stating she has no interest in being able to walk as she has been using her w/c for a while now. She just recently got a RW to help with her mobility which she was going to work on with her HHPT     Hand Dominance        Extremity/Trunk Assessment   Upper Extremity Assessment Upper Extremity Assessment: Overall WFL for tasks assessed    Lower Extremity Assessment Lower Extremity Assessment: Overall WFL for tasks assessed       Communication   Communication: No difficulties  Cognition Arousal/Alertness: Awake/alert Behavior During Therapy: WFL for tasks assessed/performed Overall Cognitive Status: Within Functional Limits for tasks assessed                      General Comments      Exercises     Assessment/Plan    PT Assessment All further PT needs can be met in the next venue of care  PT Problem List Decreased strength;Decreased activity tolerance;Decreased balance;Decreased safety awareness          PT Treatment Interventions      PT Goals (Current goals can be found in the Care Plan section)  Acute Rehab PT Goals Patient Stated Goal: go home  PT Goal Formulation: With patient Time For Goal Achievement: 12/07/16 Potential to Achieve Goals: Good    Frequency     Barriers to discharge        Co-evaluation               End of Session Equipment Utilized During Treatment: Gait belt Activity Tolerance: Patient tolerated treatment well;No increased pain;Other (comment) (Pt unwilling to walk further than in her room ) Patient left: in chair;with call bell/phone within reach;with chair alarm set      Functional Limitation: Mobility: Walking and moving around Mobility: Walking and Moving Around Current Status (T6144): At least 20 percent but less than 40 percent impaired, limited or restricted Mobility: Walking and Moving Around Discharge Status [phone]): At least 20 percent but less than 40 percent impaired, limited or  restricted    Time: 0855-0925 PT Time Calculation (min) (ACUTE ONLY): 30 min   Charges:   PT Evaluation $PT Eval Low Complexity: 1 Procedure PT Treatments $Therapeutic Activity: 8-22 mins $Self Care/Home Management: 8-22   PT G Codes:   PT G-Codes **NOT FOR INPATIENT CLASS** Functional Limitation: Mobility: Walking and moving around Mobility: Walking and Moving Around Current Status (G8676): At least 20 percent but less than 40 percent impaired, limited or restricted Mobility: Walking and Moving Around Discharge Status [phone]): At least 20 percent but less than 40 percent impaired, limited or restricted   10:39 AM,11/23/16 Elly Modena PT, [place] [facility] [phone]

## 2016-11-25 DIAGNOSIS — E669 Obesity, unspecified: Secondary | ICD-10-CM | POA: Diagnosis not present

## 2016-11-25 DIAGNOSIS — I350 Nonrheumatic aortic (valve) stenosis: Secondary | ICD-10-CM | POA: Diagnosis not present

## 2016-11-25 DIAGNOSIS — M1991 Primary osteoarthritis, unspecified site: Secondary | ICD-10-CM | POA: Diagnosis not present

## 2016-11-25 DIAGNOSIS — J45909 Unspecified asthma, uncomplicated: Secondary | ICD-10-CM | POA: Diagnosis not present

## 2016-11-25 DIAGNOSIS — E1122 Type 2 diabetes mellitus with diabetic chronic kidney disease: Secondary | ICD-10-CM | POA: Diagnosis not present

## 2016-11-25 DIAGNOSIS — N189 Chronic kidney disease, unspecified: Secondary | ICD-10-CM | POA: Diagnosis not present

## 2016-11-25 DIAGNOSIS — E538 Deficiency of other specified B group vitamins: Secondary | ICD-10-CM | POA: Diagnosis not present

## 2016-11-25 DIAGNOSIS — I4891 Unspecified atrial fibrillation: Secondary | ICD-10-CM | POA: Diagnosis not present

## 2016-11-25 DIAGNOSIS — I129 Hypertensive chronic kidney disease with stage 1 through stage 4 chronic kidney disease, or unspecified chronic kidney disease: Secondary | ICD-10-CM | POA: Diagnosis not present

## 2016-11-25 DIAGNOSIS — N390 Urinary tract infection, site not specified: Secondary | ICD-10-CM | POA: Diagnosis not present

## 2016-11-27 LAB — CULTURE, BLOOD (ROUTINE X 2)
CULTURE: NO GROWTH
Culture: NO GROWTH

## 2016-11-27 LAB — FECAL LACTOFERRIN, QUANT: Lactoferrin, Fecal, Quant.: <1 ug/mL(g) (ref 0.00–7.24)

## 2016-11-28 DIAGNOSIS — N390 Urinary tract infection, site not specified: Secondary | ICD-10-CM | POA: Diagnosis not present

## 2016-11-28 DIAGNOSIS — I350 Nonrheumatic aortic (valve) stenosis: Secondary | ICD-10-CM | POA: Diagnosis not present

## 2016-11-28 DIAGNOSIS — N189 Chronic kidney disease, unspecified: Secondary | ICD-10-CM | POA: Diagnosis not present

## 2016-11-28 DIAGNOSIS — J45909 Unspecified asthma, uncomplicated: Secondary | ICD-10-CM | POA: Diagnosis not present

## 2016-11-28 DIAGNOSIS — M1991 Primary osteoarthritis, unspecified site: Secondary | ICD-10-CM | POA: Diagnosis not present

## 2016-11-28 DIAGNOSIS — E1122 Type 2 diabetes mellitus with diabetic chronic kidney disease: Secondary | ICD-10-CM | POA: Diagnosis not present

## 2016-11-28 DIAGNOSIS — I4891 Unspecified atrial fibrillation: Secondary | ICD-10-CM | POA: Diagnosis not present

## 2016-11-28 DIAGNOSIS — I129 Hypertensive chronic kidney disease with stage 1 through stage 4 chronic kidney disease, or unspecified chronic kidney disease: Secondary | ICD-10-CM | POA: Diagnosis not present

## 2016-11-28 DIAGNOSIS — E538 Deficiency of other specified B group vitamins: Secondary | ICD-10-CM | POA: Diagnosis not present

## 2016-11-28 DIAGNOSIS — E669 Obesity, unspecified: Secondary | ICD-10-CM | POA: Diagnosis not present

## 2016-11-30 DIAGNOSIS — J45909 Unspecified asthma, uncomplicated: Secondary | ICD-10-CM | POA: Diagnosis not present

## 2016-11-30 DIAGNOSIS — I4891 Unspecified atrial fibrillation: Secondary | ICD-10-CM | POA: Diagnosis not present

## 2016-11-30 DIAGNOSIS — I129 Hypertensive chronic kidney disease with stage 1 through stage 4 chronic kidney disease, or unspecified chronic kidney disease: Secondary | ICD-10-CM | POA: Diagnosis not present

## 2016-11-30 DIAGNOSIS — I350 Nonrheumatic aortic (valve) stenosis: Secondary | ICD-10-CM | POA: Diagnosis not present

## 2016-11-30 DIAGNOSIS — M1991 Primary osteoarthritis, unspecified site: Secondary | ICD-10-CM | POA: Diagnosis not present

## 2016-11-30 DIAGNOSIS — E1122 Type 2 diabetes mellitus with diabetic chronic kidney disease: Secondary | ICD-10-CM | POA: Diagnosis not present

## 2016-11-30 DIAGNOSIS — E538 Deficiency of other specified B group vitamins: Secondary | ICD-10-CM | POA: Diagnosis not present

## 2016-11-30 DIAGNOSIS — E669 Obesity, unspecified: Secondary | ICD-10-CM | POA: Diagnosis not present

## 2016-11-30 DIAGNOSIS — N390 Urinary tract infection, site not specified: Secondary | ICD-10-CM | POA: Diagnosis not present

## 2016-11-30 DIAGNOSIS — N189 Chronic kidney disease, unspecified: Secondary | ICD-10-CM | POA: Diagnosis not present

## 2016-12-05 DIAGNOSIS — M5417 Radiculopathy, lumbosacral region: Secondary | ICD-10-CM | POA: Diagnosis not present

## 2016-12-05 DIAGNOSIS — N179 Acute kidney failure, unspecified: Secondary | ICD-10-CM | POA: Diagnosis not present

## 2016-12-05 DIAGNOSIS — G894 Chronic pain syndrome: Secondary | ICD-10-CM | POA: Diagnosis not present

## 2016-12-05 DIAGNOSIS — E1165 Type 2 diabetes mellitus with hyperglycemia: Secondary | ICD-10-CM | POA: Diagnosis not present

## 2016-12-05 DIAGNOSIS — Z1389 Encounter for screening for other disorder: Secondary | ICD-10-CM | POA: Diagnosis not present

## 2016-12-05 DIAGNOSIS — Z23 Encounter for immunization: Secondary | ICD-10-CM | POA: Diagnosis not present

## 2016-12-05 DIAGNOSIS — Z6838 Body mass index (BMI) 38.0-38.9, adult: Secondary | ICD-10-CM | POA: Diagnosis not present

## 2016-12-05 DIAGNOSIS — N184 Chronic kidney disease, stage 4 (severe): Secondary | ICD-10-CM | POA: Diagnosis not present

## 2016-12-05 DIAGNOSIS — C50911 Malignant neoplasm of unspecified site of right female breast: Secondary | ICD-10-CM | POA: Diagnosis not present

## 2016-12-05 DIAGNOSIS — M5412 Radiculopathy, cervical region: Secondary | ICD-10-CM | POA: Diagnosis not present

## 2016-12-06 DIAGNOSIS — J45909 Unspecified asthma, uncomplicated: Secondary | ICD-10-CM | POA: Diagnosis not present

## 2016-12-06 DIAGNOSIS — E1122 Type 2 diabetes mellitus with diabetic chronic kidney disease: Secondary | ICD-10-CM | POA: Diagnosis not present

## 2016-12-06 DIAGNOSIS — E538 Deficiency of other specified B group vitamins: Secondary | ICD-10-CM | POA: Diagnosis not present

## 2016-12-06 DIAGNOSIS — I4891 Unspecified atrial fibrillation: Secondary | ICD-10-CM | POA: Diagnosis not present

## 2016-12-06 DIAGNOSIS — N189 Chronic kidney disease, unspecified: Secondary | ICD-10-CM | POA: Diagnosis not present

## 2016-12-06 DIAGNOSIS — I350 Nonrheumatic aortic (valve) stenosis: Secondary | ICD-10-CM | POA: Diagnosis not present

## 2016-12-06 DIAGNOSIS — M1991 Primary osteoarthritis, unspecified site: Secondary | ICD-10-CM | POA: Diagnosis not present

## 2016-12-06 DIAGNOSIS — I129 Hypertensive chronic kidney disease with stage 1 through stage 4 chronic kidney disease, or unspecified chronic kidney disease: Secondary | ICD-10-CM | POA: Diagnosis not present

## 2016-12-06 DIAGNOSIS — N390 Urinary tract infection, site not specified: Secondary | ICD-10-CM | POA: Diagnosis not present

## 2016-12-06 DIAGNOSIS — E669 Obesity, unspecified: Secondary | ICD-10-CM | POA: Diagnosis not present

## 2016-12-12 ENCOUNTER — Other Ambulatory Visit: Payer: Self-pay | Admitting: "Endocrinology

## 2016-12-12 DIAGNOSIS — I4891 Unspecified atrial fibrillation: Secondary | ICD-10-CM | POA: Diagnosis not present

## 2016-12-12 DIAGNOSIS — E1122 Type 2 diabetes mellitus with diabetic chronic kidney disease: Secondary | ICD-10-CM | POA: Diagnosis not present

## 2016-12-12 DIAGNOSIS — I350 Nonrheumatic aortic (valve) stenosis: Secondary | ICD-10-CM | POA: Diagnosis not present

## 2016-12-12 DIAGNOSIS — G894 Chronic pain syndrome: Secondary | ICD-10-CM | POA: Diagnosis not present

## 2016-12-12 DIAGNOSIS — N390 Urinary tract infection, site not specified: Secondary | ICD-10-CM | POA: Diagnosis not present

## 2016-12-12 DIAGNOSIS — N189 Chronic kidney disease, unspecified: Secondary | ICD-10-CM | POA: Diagnosis not present

## 2016-12-12 DIAGNOSIS — M1991 Primary osteoarthritis, unspecified site: Secondary | ICD-10-CM | POA: Diagnosis not present

## 2016-12-12 DIAGNOSIS — J45909 Unspecified asthma, uncomplicated: Secondary | ICD-10-CM | POA: Diagnosis not present

## 2016-12-12 DIAGNOSIS — E113391 Type 2 diabetes mellitus with moderate nonproliferative diabetic retinopathy without macular edema, right eye: Secondary | ICD-10-CM | POA: Diagnosis not present

## 2016-12-12 DIAGNOSIS — E538 Deficiency of other specified B group vitamins: Secondary | ICD-10-CM | POA: Diagnosis not present

## 2016-12-12 DIAGNOSIS — E669 Obesity, unspecified: Secondary | ICD-10-CM | POA: Diagnosis not present

## 2016-12-12 DIAGNOSIS — I129 Hypertensive chronic kidney disease with stage 1 through stage 4 chronic kidney disease, or unspecified chronic kidney disease: Secondary | ICD-10-CM | POA: Diagnosis not present

## 2016-12-12 DIAGNOSIS — M810 Age-related osteoporosis without current pathological fracture: Secondary | ICD-10-CM | POA: Diagnosis not present

## 2016-12-14 ENCOUNTER — Other Ambulatory Visit: Payer: Self-pay | Admitting: "Endocrinology

## 2016-12-14 DIAGNOSIS — E1122 Type 2 diabetes mellitus with diabetic chronic kidney disease: Secondary | ICD-10-CM | POA: Diagnosis not present

## 2016-12-14 DIAGNOSIS — N390 Urinary tract infection, site not specified: Secondary | ICD-10-CM | POA: Diagnosis not present

## 2016-12-14 DIAGNOSIS — M1991 Primary osteoarthritis, unspecified site: Secondary | ICD-10-CM | POA: Diagnosis not present

## 2016-12-14 DIAGNOSIS — I129 Hypertensive chronic kidney disease with stage 1 through stage 4 chronic kidney disease, or unspecified chronic kidney disease: Secondary | ICD-10-CM | POA: Diagnosis not present

## 2016-12-14 DIAGNOSIS — J45909 Unspecified asthma, uncomplicated: Secondary | ICD-10-CM | POA: Diagnosis not present

## 2016-12-14 DIAGNOSIS — E538 Deficiency of other specified B group vitamins: Secondary | ICD-10-CM | POA: Diagnosis not present

## 2016-12-14 DIAGNOSIS — I4891 Unspecified atrial fibrillation: Secondary | ICD-10-CM | POA: Diagnosis not present

## 2016-12-14 DIAGNOSIS — E669 Obesity, unspecified: Secondary | ICD-10-CM | POA: Diagnosis not present

## 2016-12-14 DIAGNOSIS — I350 Nonrheumatic aortic (valve) stenosis: Secondary | ICD-10-CM | POA: Diagnosis not present

## 2016-12-14 DIAGNOSIS — N189 Chronic kidney disease, unspecified: Secondary | ICD-10-CM | POA: Diagnosis not present

## 2016-12-26 ENCOUNTER — Other Ambulatory Visit (HOSPITAL_COMMUNITY): Payer: Self-pay | Admitting: Internal Medicine

## 2016-12-26 ENCOUNTER — Ambulatory Visit (HOSPITAL_COMMUNITY)
Admission: RE | Admit: 2016-12-26 | Discharge: 2016-12-26 | Disposition: A | Discharge status: Home / Self Care | Payer: Medicare HMO | Source: Ambulatory Visit | Attending: Internal Medicine | Admitting: Internal Medicine

## 2016-12-26 DIAGNOSIS — I5033 Acute on chronic diastolic (congestive) heart failure: Secondary | ICD-10-CM | POA: Insufficient documentation

## 2016-12-26 DIAGNOSIS — I509 Heart failure, unspecified: Secondary | ICD-10-CM | POA: Diagnosis not present

## 2016-12-26 DIAGNOSIS — I517 Cardiomegaly: Secondary | ICD-10-CM | POA: Diagnosis not present

## 2017-01-08 DIAGNOSIS — Z23 Encounter for immunization: Secondary | ICD-10-CM | POA: Diagnosis not present

## 2017-01-10 ENCOUNTER — Other Ambulatory Visit: Payer: Self-pay | Admitting: "Endocrinology

## 2017-01-11 DIAGNOSIS — D631 Anemia in chronic kidney disease: Secondary | ICD-10-CM | POA: Diagnosis not present

## 2017-01-11 DIAGNOSIS — N183 Chronic kidney disease, stage 3 (moderate): Secondary | ICD-10-CM | POA: Diagnosis not present

## 2017-01-11 DIAGNOSIS — N2581 Secondary hyperparathyroidism of renal origin: Secondary | ICD-10-CM | POA: Diagnosis not present

## 2017-01-11 DIAGNOSIS — R809 Proteinuria, unspecified: Secondary | ICD-10-CM | POA: Diagnosis not present

## 2017-01-12 DIAGNOSIS — M810 Age-related osteoporosis without current pathological fracture: Secondary | ICD-10-CM | POA: Diagnosis not present

## 2017-01-12 DIAGNOSIS — G894 Chronic pain syndrome: Secondary | ICD-10-CM | POA: Diagnosis not present

## 2017-01-12 DIAGNOSIS — E113391 Type 2 diabetes mellitus with moderate nonproliferative diabetic retinopathy without macular edema, right eye: Secondary | ICD-10-CM | POA: Diagnosis not present

## 2017-01-14 ENCOUNTER — Other Ambulatory Visit: Payer: Self-pay | Admitting: "Endocrinology

## 2017-01-14 DIAGNOSIS — E875 Hyperkalemia: Secondary | ICD-10-CM | POA: Diagnosis not present

## 2017-01-28 ENCOUNTER — Encounter (HOSPITAL_COMMUNITY): Payer: Medicare HMO | Attending: Oncology | Admitting: Oncology

## 2017-01-28 ENCOUNTER — Encounter (HOSPITAL_COMMUNITY): Payer: Self-pay | Admitting: Oncology

## 2017-01-28 ENCOUNTER — Encounter (HOSPITAL_COMMUNITY): Payer: Medicare HMO

## 2017-01-28 VITALS — BP 146/47 | HR 77 | Temp 98.3°F | Resp 18 | Ht 71.0 in | Wt 228.0 lb

## 2017-01-28 DIAGNOSIS — C50911 Malignant neoplasm of unspecified site of right female breast: Secondary | ICD-10-CM

## 2017-01-28 DIAGNOSIS — Z853 Personal history of malignant neoplasm of breast: Secondary | ICD-10-CM

## 2017-01-28 DIAGNOSIS — J301 Allergic rhinitis due to pollen: Secondary | ICD-10-CM

## 2017-01-28 MED ORDER — HEPARIN SOD (PORK) LOCK FLUSH 100 UNIT/ML IV SOLN
INTRAVENOUS | Status: AC
  Filled 2017-01-28: qty 5

## 2017-01-28 MED ORDER — SODIUM CHLORIDE 0.9% FLUSH
10.0000 mL | INTRAVENOUS | Status: DC | PRN
Start: 2017-01-28 — End: 2017-01-28

## 2017-01-28 MED ORDER — FLUTICASONE PROPIONATE 50 MCG/ACT NA SUSP: 1.0000 | Freq: Every day | NASAL | 2 refills | Status: DC

## 2017-01-28 MED ORDER — HEPARIN SOD (PORK) LOCK FLUSH 100 UNIT/ML IV SOLN
500.0000 [IU] | Freq: Once | INTRAVENOUS | Status: AC
  Administered 2017-01-28: 500 [IU] via INTRAVENOUS

## 2017-01-28 NOTE — Progress Notes (Signed)
[name] presented for Portacath access and flush.  Portacath located left chest wall accessed with  H 20 needle.  No blood return and flushes w/o difficulty; no s/s infiltration at site.  Portacath flushed with 33ml NS and 500U/70ml Heparin and needle removed intact.  Procedure tolerated well and without incident.

## 2017-01-28 NOTE — Assessment & Plan Note (Addendum)
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 This was an ER, PR, negative cancer but HER-2/neu 3+ positive. She was treated with FEC x4 cycles followed by surgery. We then treated with Taxotere navelbine along with Herceptin the latter for 52 weeks. She was able to tolerate only 2 cycles of Taxotere navelbine of a planned 4 cycles. She declined radiation therapy however but finished all of her Herceptin.  NED.  No oncology role for labs today.  I personally reviewed and went over radiographic studies with the patient.  The results are noted within this dictation.  Mammogram in October 2017 was negative.  She will be due for her next screening mammogram in October 2018.  Order is placed for screening mammogram in October 2018.  She requests prescription for breast prostheses.  This is provided today.  She asks about a prescription for Flonase.  She notes that she takes over-the-counter but this is becoming cost prohibitive.  Prescription is provided for Flonase for seasonal allergies.  Return in 12 months for follow-up and breast exam.

## 2017-01-28 NOTE — Patient Instructions (Addendum)
[name] at [facility] Discharge Instructions  RECOMMENDATIONS MADE BY THE CONSULTANT AND ANY TEST RESULTS WILL BE SENT TO YOUR REFERRING PHYSICIAN.  You were seen today by Kirby Crigler PA-C. Mammogram in October 2018. Continue port flushes every 6-8 weeks. Return in 1 year for follow up.     Thank you for choosing [facility] to provide your oncology and hematology care.  To afford each patient quality time with our provider, please arrive at least 15 minutes before your scheduled appointment time.    If you have a lab appointment with the [facility] please come in thru the  Main Entrance and check in at the main information desk  You need to re-schedule your appointment should you arrive 10 or more minutes late.  We strive to give you quality time with our providers, and arriving late affects you and other patients whose appointments are after yours.  Also, if you no show three or more times for appointments you may be dismissed from the clinic at the providers discretion.     Again, thank you for choosing [facility].  Our hope is that these requests will decrease the amount of time that you wait before being seen by our physicians.       _____________________________________________________________  Should you have questions after your visit to [facility], please contact our office at [phone] between the hours of 8:30 a.m. and 4:30 p.m.  Voicemails left after 4:30 p.m. will not be returned until the following business day.  For prescription refill requests, have your pharmacy contact our office.       Resources For Cancer Patients and their Caregivers ? American Cancer Society: Can assist with transportation, wigs, general needs, runs Look Good Feel Better.        351 169 8558 ? Cancer Care: Provides financial assistance, online support groups, medication/co-pay assistance.   1-800-813-HOPE 8252700134) ? [facility] Assists [place] cancer patients and their families through emotional , educational and financial support.  3021731268 ? [place] DSS Where to apply for food stamps, Medicaid and utility assistance. 587-750-2273 ? [facility]: Transportation to medical appointments. 630-241-3375 ? Social Security Administration: May apply for disability if have a Stage IV cancer. 6022725234 2676832151 ? [employer], Disability and Transit Services: Assists with nutrition, care and transit needs. [facility] Support Programs: $RemoveBeforeDEI'@10RELATIVEDAYS'enxyJAdmFTKGbvod$ @ > Cancer Support Group  2nd Tuesday of the month 1pm-2pm, Journey Room  > Creative Journey  3rd Tuesday of the month 1130am-1pm, Journey Room  > Look Good Feel Better  1st Wednesday of the month 10am-12 noon, Journey Room (Call [facility] to register [phone])

## 2017-01-28 NOTE — Progress Notes (Signed)
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 female returns for followup of inflammatory carcinoma right breast with her initial biopsy on 10/22/2006. This was an ER, PR, negative cancer but HER-2/neu 3+ positive. She was treated with FEC x4 cycles followed by surgery. We then treated with Taxotere navelbine along with Herceptin the latter for 52 weeks. She was able to tolerate only 2 cycles of Taxotere navelbine of a planned 4 cycles. She declined radiation therapy however but finished all of her Herceptin.  Recent hospitalization in December 2017 for acute kidney injury is noted.  Since then, she has followed-up with Dr. Justin Mend (nephrologist) in [place].  She knows that she feels significantly improved since being on the hospital.  She denies any oncology complaints today.  She is up-to-date on mammograms.  We will perform breast exam today.  Review of Systems  Constitutional: Negative.  Negative for chills, fever and weight loss.  HENT: Negative.   Eyes: Negative.   Respiratory: Negative.  Negative for cough.   Cardiovascular: Negative.  Negative for chest pain.  Gastrointestinal: Negative.  Negative for blood in stool, constipation, diarrhea, melena, nausea and vomiting.  Genitourinary: Negative.   Musculoskeletal: Negative.   Skin: Negative.   Neurological: Negative.  Negative for weakness.  Endo/Heme/Allergies: Negative.   Psychiatric/Behavioral: Negative.     Past Medical History:  Diagnosis Date  . Anemia   . Aortic stenosis    Mild  . Arthritis   . Asthma   . Atrial flutter ([place]) 2002  . B12 deficiency 06/20/2015  . B12 deficiency 06/20/2015  . Breast  carcinoma ([place])   . Cerebrovascular disease    with a 33% LICA; repeat study in 10/2009-no obstructive disease; modest ASVD  . Chronic kidney disease    Creatinine-1.5 in 2010 and 2.5-3 in 2011; 1.5-10/2010;  Klebsiella UTI-10/2010. urine protein 36 mg/dl, mildly elevated  . Decreased bone density   . Depression   . Diabetes mellitus   . Diabetic retinopathy   . DJD (degenerative joint disease) 11/14/2011  . Falls infrequently 01/2010   fracture of pelvis and left humerus  . Fibromyalgia   . Headaches, cluster   . Hyperlipidemia   . Hypertension   . IBS (irritable bowel syndrome)    diverticulosis, gastroesophageal reflux disease  . Lower back pain   . Obesity 11/14/2011    Past Surgical History:  Procedure Laterality Date  . APPENDECTOMY    . CATARACT EXTRACTION, BILATERAL     Lens implants  . CENTRAL VENOUS CATHETER TUNNELED INSERTION SINGLE LUMEN    . CHOLECYSTECTOMY    . COLONOSCOPY  2012  . EYE SURGERY  nov 2012   for diabetic retinopathy  . LUMBAR DISC SURGERY     lumbosacral spine procedure x 2  . MASTECTOMY     Right breast for carcinoma    Family History  Problem Relation Age of Onset  . Alzheimer's disease Mother   . Heart attack Father   . Aneurysm Sister   . Coronary artery disease Brother     Social History   Social History  . Marital status: Widowed    Spouse name: N/A  . Number of children: 2  .  Years of education: N/A   Occupational History  . On disability Retired   Social History Main Topics  . Smoking status: Former Smoker    Packs/day: 1.00    Years: 30.00    Types: Cigarettes    Quit date: 13  . Smokeless tobacco: Never Used  . Alcohol use No  . Drug use: No  . Sexual activity: Not Asked   Other Topics Concern  . None   Social History Narrative   Lives alone     PHYSICAL EXAMINATION  ECOG PERFORMANCE STATUS: 2 - Symptomatic, <50% confined to bed  Vitals:   01/28/17 1500  BP: (!) 146/47  Pulse: 77  Resp: 18    Temp: 98.3 F (36.8 C)    GENERAL:alert, no distress, well nourished, well developed, comfortable, cooperative, obese, smiling and accompanied by transporter. SKIN: skin color, texture, turgor are normal, no rashes or significant lesions HEAD: Normocephalic, No masses, lesions, tenderness or abnormalities EYES: normal, EOMI, Conjunctiva are pink and non-injected EARS: External ears normal OROPHARYNX:lips, buccal mucosa, and tongue normal and mucous membranes are moist  NECK: supple, no adenopathy, trachea midline LYMPH:  no palpable lymphadenopathy BREAST:left breast normal without mass, skin or nipple changes or axillary nodes, right post-mastectomy site well healed and free of suspicious changes LUNGS: clear to auscultation and percussion HEART: regular rate & rhythm, no murmurs and no gallops ABDOMEN:abdomen soft, obese and normal bowel sounds BACK: Back symmetric, no curvature. EXTREMITIES:less then 2 second capillary refill, no joint deformities, effusion, or inflammation, no skin discoloration, no cyanosis  NEURO: alert & oriented x 3 with fluent speech, no focal motor/sensory deficits, in wheelchair   LABORATORY DATA: CBC    Component Value Date/Time   WBC 6.3 11/22/2016 0356   RBC 3.60 (L) 11/22/2016 0356   HGB 10.0 (L) 11/22/2016 0356   HCT 31.2 (L) 11/22/2016 0356   PLT 258 11/22/2016 0356   MCV 86.7 11/22/2016 0356   MCH 27.8 11/22/2016 0356   MCHC 32.1 11/22/2016 0356   RDW 13.5 11/22/2016 0356   LYMPHSABS 1.0 11/21/2016 1553   MONOABS 0.4 11/21/2016 1553   EOSABS 0.1 11/21/2016 1553   BASOSABS 0.0 11/21/2016 1553      Chemistry      Component Value Date/Time   NA 141 11/23/2016 0627   K 5.2 (H) 11/23/2016 0627   CL 110 11/23/2016 0627   CO2 26 11/23/2016 0627   BUN 55 (H) 11/23/2016 0627   CREATININE 2.32 (H) 11/23/2016 0627   CREATININE 1.36 (H) 07/26/2011 0910      Component Value Date/Time   CALCIUM 8.3 (L) 11/23/2016 0627   ALKPHOS 38 11/21/2016  1553   AST 14 (L) 11/21/2016 1553   ALT 9 (L) 11/21/2016 1553   BILITOT 0.2 (L) 11/21/2016 1553        PENDING LABS:   RADIOGRAPHIC STUDIES:  No results found.   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 This was an ER, PR, negative cancer but HER-2/neu 3+ positive. She was treated with FEC x4 cycles followed by surgery. We then treated with Taxotere navelbine along with Herceptin the latter for 52 weeks. She was able to tolerate only 2 cycles of Taxotere navelbine of a planned 4 cycles. She declined radiation therapy however but finished all of her Herceptin.  NED.  No oncology role for labs today.  I personally reviewed and went over radiographic studies  with the patient.  The results are noted within this dictation.  Mammogram in October 2017 was negative.  She will be due for her next screening mammogram in October 2018.  Order is placed for screening mammogram in October 2018.  She requests prescription for breast prostheses.  This is provided today.  She asks about a prescription for Flonase.  She notes that she takes over-the-counter but this is becoming cost prohibitive.  Prescription is provided for Flonase for seasonal allergies.  Return in 12 months for follow-up and breast exam.   ORDERS PLACED FOR THIS ENCOUNTER: No orders of the defined types were placed in this encounter.   MEDICATIONS PRESCRIBED THIS ENCOUNTER: Meds ordered this encounter  Medications  . fluticasone (FLONASE) 50 MCG/ACT nasal spray    Sig: Place 1 spray into both nostrils daily.    Dispense:  16 g    Refill:  2    Order Specific Question:   Supervising Provider    Answer:   Brunetta Genera [[id number]]    THERAPY PLAN:  NCCN guidelines recommends the following surveillance for invasive breast cancer (2.2017):  A. History and Physical exam 1-4 times per year as  clinically appropriate for 5 years, then annually.  B. Periodic screening for changes in family history and referral to genetics counseling as indicated  C. Educate, monitor, and refer to lymphedema management.  D. Mammography every 12 months  E. Routine imaging of reconstructed breast is not indicated.  F. In the absence of clinical signs and symptoms suggestive of recurrent disease, there is no indication for laboratory or imaging studies for metastases screening.  G. Women on Tamoxifen: annual gynecologic assessment every 12 months if uterus is present.  H. Women on aromatase inhibitor or who experience ovarian failure secondary to treatment should have monitoring of bone health with a bone mineral density determination at baseline and periodically thereafter.  I. Assess and encourage adherence to adjuvant endocrine therapy.  J. Evidence suggests that active lifestyle, healthy diet, limited alcohol intake, and achieving and maintaining an ideal body weight (20-25 BMI) may lead to optimal breast cancer outcomes.   All questions were answered. The patient knows to call the clinic with any problems, questions or concerns. We can certainly see the patient much sooner if necessary.  Patient and plan discussed with Dr. Twana First and she is in agreement with the aforementioned.   This note is electronically signed by: Doy Mince 01/28/2017 5:02 PM

## 2017-01-30 DIAGNOSIS — M4316 Spondylolisthesis, lumbar region: Secondary | ICD-10-CM | POA: Diagnosis not present

## 2017-01-30 DIAGNOSIS — I130 Hypertensive heart and chronic kidney disease with heart failure and stage 1 through stage 4 chronic kidney disease, or unspecified chronic kidney disease: Secondary | ICD-10-CM | POA: Diagnosis not present

## 2017-01-30 DIAGNOSIS — Z981 Arthrodesis status: Secondary | ICD-10-CM | POA: Diagnosis not present

## 2017-01-30 DIAGNOSIS — N183 Chronic kidney disease, stage 3 (moderate): Secondary | ICD-10-CM | POA: Diagnosis not present

## 2017-01-30 DIAGNOSIS — Z9889 Other specified postprocedural states: Secondary | ICD-10-CM | POA: Diagnosis not present

## 2017-01-30 DIAGNOSIS — J449 Chronic obstructive pulmonary disease, unspecified: Secondary | ICD-10-CM | POA: Diagnosis not present

## 2017-01-30 DIAGNOSIS — M5442 Lumbago with sciatica, left side: Secondary | ICD-10-CM | POA: Diagnosis not present

## 2017-01-30 DIAGNOSIS — E1122 Type 2 diabetes mellitus with diabetic chronic kidney disease: Secondary | ICD-10-CM | POA: Diagnosis not present

## 2017-01-30 DIAGNOSIS — M5417 Radiculopathy, lumbosacral region: Secondary | ICD-10-CM | POA: Diagnosis not present

## 2017-01-30 DIAGNOSIS — M4726 Other spondylosis with radiculopathy, lumbar region: Secondary | ICD-10-CM | POA: Diagnosis not present

## 2017-01-30 DIAGNOSIS — M5441 Lumbago with sciatica, right side: Secondary | ICD-10-CM | POA: Diagnosis not present

## 2017-01-30 DIAGNOSIS — M4696 Unspecified inflammatory spondylopathy, lumbar region: Secondary | ICD-10-CM | POA: Diagnosis not present

## 2017-01-30 DIAGNOSIS — G8929 Other chronic pain: Secondary | ICD-10-CM | POA: Diagnosis not present

## 2017-01-30 DIAGNOSIS — I509 Heart failure, unspecified: Secondary | ICD-10-CM | POA: Diagnosis not present

## 2017-01-30 DIAGNOSIS — M5136 Other intervertebral disc degeneration, lumbar region: Secondary | ICD-10-CM | POA: Diagnosis not present

## 2017-01-30 DIAGNOSIS — Z87891 Personal history of nicotine dependence: Secondary | ICD-10-CM | POA: Diagnosis not present

## 2017-02-04 DIAGNOSIS — R69 Illness, unspecified: Secondary | ICD-10-CM | POA: Diagnosis not present

## 2017-02-04 DIAGNOSIS — Z1389 Encounter for screening for other disorder: Secondary | ICD-10-CM | POA: Diagnosis not present

## 2017-02-04 DIAGNOSIS — N184 Chronic kidney disease, stage 4 (severe): Secondary | ICD-10-CM | POA: Diagnosis not present

## 2017-02-04 DIAGNOSIS — L84 Corns and callosities: Secondary | ICD-10-CM | POA: Diagnosis not present

## 2017-02-04 DIAGNOSIS — Z6838 Body mass index (BMI) 38.0-38.9, adult: Secondary | ICD-10-CM | POA: Diagnosis not present

## 2017-02-04 DIAGNOSIS — G894 Chronic pain syndrome: Secondary | ICD-10-CM | POA: Diagnosis not present

## 2017-02-04 DIAGNOSIS — I10 Essential (primary) hypertension: Secondary | ICD-10-CM | POA: Diagnosis not present

## 2017-02-04 DIAGNOSIS — R279 Unspecified lack of coordination: Secondary | ICD-10-CM | POA: Diagnosis not present

## 2017-02-04 DIAGNOSIS — E1140 Type 2 diabetes mellitus with diabetic neuropathy, unspecified: Secondary | ICD-10-CM | POA: Diagnosis not present

## 2017-02-04 DIAGNOSIS — E1165 Type 2 diabetes mellitus with hyperglycemia: Secondary | ICD-10-CM | POA: Diagnosis not present

## 2017-02-04 DIAGNOSIS — N183 Chronic kidney disease, stage 3 (moderate): Secondary | ICD-10-CM | POA: Diagnosis not present

## 2017-02-04 DIAGNOSIS — Z743 Need for continuous supervision: Secondary | ICD-10-CM | POA: Diagnosis not present

## 2017-02-09 DIAGNOSIS — G894 Chronic pain syndrome: Secondary | ICD-10-CM | POA: Diagnosis not present

## 2017-02-09 DIAGNOSIS — E113391 Type 2 diabetes mellitus with moderate nonproliferative diabetic retinopathy without macular edema, right eye: Secondary | ICD-10-CM | POA: Diagnosis not present

## 2017-02-09 DIAGNOSIS — M810 Age-related osteoporosis without current pathological fracture: Secondary | ICD-10-CM | POA: Diagnosis not present

## 2017-02-14 DIAGNOSIS — M5441 Lumbago with sciatica, right side: Secondary | ICD-10-CM | POA: Diagnosis not present

## 2017-02-14 DIAGNOSIS — M5442 Lumbago with sciatica, left side: Secondary | ICD-10-CM | POA: Diagnosis not present

## 2017-02-14 DIAGNOSIS — R404 Transient alteration of awareness: Secondary | ICD-10-CM | POA: Diagnosis not present

## 2017-02-14 DIAGNOSIS — M546 Pain in thoracic spine: Secondary | ICD-10-CM | POA: Diagnosis not present

## 2017-02-14 DIAGNOSIS — R531 Weakness: Secondary | ICD-10-CM | POA: Diagnosis not present

## 2017-02-14 DIAGNOSIS — M5417 Radiculopathy, lumbosacral region: Secondary | ICD-10-CM | POA: Diagnosis not present

## 2017-02-14 DIAGNOSIS — Z981 Arthrodesis status: Secondary | ICD-10-CM | POA: Diagnosis not present

## 2017-02-15 ENCOUNTER — Encounter (HOSPITAL_COMMUNITY): Payer: Self-pay | Admitting: Emergency Medicine

## 2017-02-15 ENCOUNTER — Emergency Department (HOSPITAL_COMMUNITY): Payer: Medicare HMO

## 2017-02-15 ENCOUNTER — Observation Stay (HOSPITAL_COMMUNITY)
Admission: EM | Admit: 2017-02-15 | Discharge: 2017-02-17 | Disposition: A | Discharge status: Home / Self Care | Payer: Medicare HMO | Attending: Family Medicine | Admitting: Family Medicine

## 2017-02-15 ENCOUNTER — Other Ambulatory Visit: Payer: Self-pay

## 2017-02-15 DIAGNOSIS — Z87891 Personal history of nicotine dependence: Secondary | ICD-10-CM | POA: Insufficient documentation

## 2017-02-15 DIAGNOSIS — S299XXA Unspecified injury of thorax, initial encounter: Secondary | ICD-10-CM | POA: Diagnosis not present

## 2017-02-15 DIAGNOSIS — N189 Chronic kidney disease, unspecified: Secondary | ICD-10-CM | POA: Diagnosis not present

## 2017-02-15 DIAGNOSIS — I129 Hypertensive chronic kidney disease with stage 1 through stage 4 chronic kidney disease, or unspecified chronic kidney disease: Secondary | ICD-10-CM | POA: Insufficient documentation

## 2017-02-15 DIAGNOSIS — R531 Weakness: Secondary | ICD-10-CM | POA: Diagnosis not present

## 2017-02-15 DIAGNOSIS — R404 Transient alteration of awareness: Secondary | ICD-10-CM | POA: Diagnosis not present

## 2017-02-15 DIAGNOSIS — Z7982 Long term (current) use of aspirin: Secondary | ICD-10-CM | POA: Insufficient documentation

## 2017-02-15 DIAGNOSIS — E11649 Type 2 diabetes mellitus with hypoglycemia without coma: Secondary | ICD-10-CM | POA: Diagnosis not present

## 2017-02-15 DIAGNOSIS — I10 Essential (primary) hypertension: Secondary | ICD-10-CM | POA: Diagnosis present

## 2017-02-15 DIAGNOSIS — Y999 Unspecified external cause status: Secondary | ICD-10-CM | POA: Diagnosis not present

## 2017-02-15 DIAGNOSIS — E1122 Type 2 diabetes mellitus with diabetic chronic kidney disease: Secondary | ICD-10-CM | POA: Insufficient documentation

## 2017-02-15 DIAGNOSIS — Y92009 Unspecified place in unspecified non-institutional (private) residence as the place of occurrence of the external cause: Secondary | ICD-10-CM | POA: Diagnosis not present

## 2017-02-15 DIAGNOSIS — Z79899 Other long term (current) drug therapy: Secondary | ICD-10-CM | POA: Diagnosis not present

## 2017-02-15 DIAGNOSIS — R4182 Altered mental status, unspecified: Secondary | ICD-10-CM | POA: Diagnosis not present

## 2017-02-15 DIAGNOSIS — R410 Disorientation, unspecified: Principal | ICD-10-CM | POA: Insufficient documentation

## 2017-02-15 DIAGNOSIS — W19XXXA Unspecified fall, initial encounter: Secondary | ICD-10-CM | POA: Diagnosis not present

## 2017-02-15 DIAGNOSIS — G9341 Metabolic encephalopathy: Secondary | ICD-10-CM | POA: Diagnosis present

## 2017-02-15 DIAGNOSIS — L8990 Pressure ulcer of unspecified site, unspecified stage: Secondary | ICD-10-CM | POA: Insufficient documentation

## 2017-02-15 DIAGNOSIS — J45909 Unspecified asthma, uncomplicated: Secondary | ICD-10-CM | POA: Insufficient documentation

## 2017-02-15 DIAGNOSIS — M797 Fibromyalgia: Secondary | ICD-10-CM | POA: Diagnosis present

## 2017-02-15 DIAGNOSIS — Z7984 Long term (current) use of oral hypoglycemic drugs: Secondary | ICD-10-CM | POA: Diagnosis not present

## 2017-02-15 DIAGNOSIS — R42 Dizziness and giddiness: Secondary | ICD-10-CM | POA: Diagnosis not present

## 2017-02-15 DIAGNOSIS — I4891 Unspecified atrial fibrillation: Secondary | ICD-10-CM | POA: Diagnosis present

## 2017-02-15 DIAGNOSIS — Y939 Activity, unspecified: Secondary | ICD-10-CM | POA: Diagnosis not present

## 2017-02-15 DIAGNOSIS — E162 Hypoglycemia, unspecified: Secondary | ICD-10-CM

## 2017-02-15 DIAGNOSIS — N184 Chronic kidney disease, stage 4 (severe): Secondary | ICD-10-CM | POA: Diagnosis present

## 2017-02-15 LAB — CBC WITH DIFFERENTIAL/PLATELET
BASOS PCT: 0 %
Basophils Absolute: 0 10*3/uL (ref 0.0–0.1)
Eosinophils Absolute: 0.1 10*3/uL (ref 0.0–0.7)
Eosinophils Relative: 1 %
HCT: 26.2 % — ABNORMAL LOW (ref 36.0–46.0)
HEMOGLOBIN: 8.6 g/dL — AB (ref 12.0–15.0)
Lymphocytes Relative: 11 %
Lymphs Abs: 1 10*3/uL (ref 0.7–4.0)
MCH: 28.5 pg (ref 26.0–34.0)
MCHC: 32.8 g/dL (ref 30.0–36.0)
MCV: 86.8 fL (ref 78.0–100.0)
MONOS PCT: 6 %
Monocytes Absolute: 0.5 10*3/uL (ref 0.1–1.0)
NEUTROS ABS: 7 10*3/uL (ref 1.7–7.7)
NEUTROS PCT: 82 %
Platelets: 342 10*3/uL (ref 150–400)
RBC: 3.02 MIL/uL — ABNORMAL LOW (ref 3.87–5.11)
RDW: 13.9 % (ref 11.5–15.5)
WBC: 8.6 10*3/uL (ref 4.0–10.5)

## 2017-02-15 LAB — CBG MONITORING, ED
GLUCOSE-CAPILLARY: 112 mg/dL — AB (ref 65–99)
Glucose-Capillary: 36 mg/dL — CL (ref 65–99)
Glucose-Capillary: 133 mg/dL — ABNORMAL HIGH (ref 65–99)
Glucose-Capillary: 134 mg/dL — ABNORMAL HIGH (ref 65–99)

## 2017-02-15 LAB — COMPREHENSIVE METABOLIC PANEL
ALBUMIN: 2.5 g/dL — AB (ref 3.5–5.0)
ALT: 12 U/L — ABNORMAL LOW (ref 14–54)
ANION GAP: 9 (ref 5–15)
AST: 18 U/L (ref 15–41)
Alkaline Phosphatase: 33 U/L — ABNORMAL LOW (ref 38–126)
BILIRUBIN TOTAL: 0.3 mg/dL (ref 0.3–1.2)
BUN: 87 mg/dL — ABNORMAL HIGH (ref 6–20)
CALCIUM: 8.6 mg/dL — AB (ref 8.9–10.3)
CHLORIDE: 101 mmol/L (ref 101–111)
CO2: 26 mmol/L (ref 22–32)
CREATININE: 2.97 mg/dL — AB (ref 0.44–1.00)
GFR calc Af Amer: 16 mL/min — ABNORMAL LOW (ref >60)
GFR calc non Af Amer: 14 mL/min — ABNORMAL LOW (ref >60)
Glucose, Bld: 204 mg/dL — ABNORMAL HIGH (ref 65–99)
POTASSIUM: 3.2 mmol/L — AB (ref 3.5–5.1)
SODIUM: 136 mmol/L (ref 135–145)
TOTAL PROTEIN: 6.3 g/dL — AB (ref 6.5–8.1)

## 2017-02-15 LAB — URINALYSIS, ROUTINE W REFLEX MICROSCOPIC
Bilirubin Urine: NEGATIVE
Glucose, UA: NEGATIVE mg/dL
HGB URINE DIPSTICK: NEGATIVE
Ketones, ur: NEGATIVE mg/dL
Leukocytes, UA: NEGATIVE
Nitrite: NEGATIVE
PH: 5.5 (ref 5.0–8.0)
Protein, ur: NEGATIVE mg/dL
SPECIFIC GRAVITY, URINE: 1.015 (ref 1.005–1.030)

## 2017-02-15 LAB — PROTIME-INR
INR: 1.13
PROTHROMBIN TIME: 14.6 s (ref 11.4–15.2)

## 2017-02-15 LAB — TROPONIN I: Troponin I: <0.03 ng/mL (ref <0.03)

## 2017-02-15 LAB — I-STAT CG4 LACTIC ACID, ED: Lactic Acid, Venous: 0.58 mmol/L (ref 0.5–1.9)

## 2017-02-15 MED ORDER — DEXTROSE 50 % IV SOLN
INTRAVENOUS | Status: AC
  Administered 2017-02-15: 50 mL
  Filled 2017-02-15: qty 50

## 2017-02-15 MED ORDER — SODIUM CHLORIDE 0.9 % IV BOLUS (SEPSIS)
500.0000 mL | Freq: Once | INTRAVENOUS | Status: AC
  Administered 2017-02-15: 500 mL via INTRAVENOUS

## 2017-02-15 NOTE — ED Provider Notes (Signed)
[facility] DEPT Provider Note   CSN: [id number] Arrival date & time: 02/15/17  1846     History   Chief Complaint Chief Complaint  Patient presents with  . Dizziness  . Fall    HPI [name] is a 83 y.o. female.  HPI Patient brought in by EMS. Thinks she fell earlier. She is unable to give details. She lives alone. No family at bedside. Unknown loss of consciousness. Denies any current pain. Level V caveat due to confusion. Past Medical History:  Diagnosis Date  . Anemia   . Aortic stenosis    Mild  . Arthritis   . Asthma   . Atrial flutter ([place]) 2002  . B12 deficiency 06/20/2015  . B12 deficiency 06/20/2015  . Breast carcinoma ([place])   . Cerebrovascular disease    with a 19% LICA; repeat study in 10/2009-no obstructive disease; modest ASVD  . Chronic kidney disease    Creatinine-1.5 in 2010 and 2.5-3 in 2011; 1.5-10/2010;  Klebsiella UTI-10/2010. urine protein 36 mg/dl, mildly elevated  . Decreased bone density   . Depression   . Diabetes mellitus   . Diabetic retinopathy   . DJD (degenerative joint disease) 11/14/2011  . Falls infrequently 01/2010   fracture of pelvis and left humerus  . Fibromyalgia   . Headaches, cluster   . Hyperlipidemia   . Hypertension   . IBS (irritable bowel syndrome)    diverticulosis, gastroesophageal reflux disease  . Lower back pain   . Obesity 11/14/2011    Patient Active Problem List   Diagnosis Date Noted  . Acute kidney injury superimposed on chronic kidney disease ([place]) 11/21/2016  . Hyperglycemia 11/21/2016  . Acute cystitis without hematuria 11/21/2016  . Diarrhea of presumed infectious origin 11/21/2016  . UTI (urinary tract infection) 11/10/2016  . Colitis 11/10/2016  . Elevated troponin I level 11/10/2016  . B12 deficiency 06/20/2015  . UTI (lower urinary tract infection) 05/21/2014  . DJD (degenerative joint disease) 11/14/2011  . Obesity 11/14/2011  . Inflammatory carcinoma of right breast   . Anemia   .  IBS (irritable bowel syndrome)   . Essential hypertension   . Aortic stenosis   . Chronic kidney disease   . Fibromyalgia   . Cerebrovascular disease   . Atrial fibrillation ([place]) 07/14/2010  . Falls infrequently 01/31/2010  . HYPERLIPIDEMIA 11/15/2009  . 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 DEPRESSION/ANXIETY 10/12/2009  . SPINAL STENOSIS, LUMBAR 10/12/2009    Past Surgical History:  Procedure Laterality Date  . APPENDECTOMY    . CATARACT EXTRACTION, BILATERAL     Lens implants  . CENTRAL VENOUS CATHETER TUNNELED INSERTION SINGLE LUMEN    . CHOLECYSTECTOMY    . COLONOSCOPY  2012  . EYE SURGERY  nov 2012   for diabetic retinopathy  . LUMBAR DISC SURGERY     lumbosacral spine procedure x 2  . MASTECTOMY     Right breast for carcinoma    OB History    No data available       Home Medications    Prior to Admission medications   Medication Sig Start Date End Date Taking? Authorizing Provider  albuterol (PROAIR HFA) 108 (90 BASE) MCG/ACT inhaler Inhale 2 puffs into the lungs every 6 (six) hours as needed for wheezing or shortness of breath. 06/16/14  Yes Baird Cancer, PA-C  aspirin EC 81 MG tablet Take 81 mg by mouth daily.   Yes Historical Provider, MD  cyanocobalamin (,VITAMIN B-12,) 1000 MCG/ML injection Inject 1,000 mcg into the muscle every 30 (thirty) days.   Yes Historical Provider, MD  Diclofenac Sodium 3 % GEL Apply 1 application topically 3 (three) times daily. 01/30/17  Yes Historical Provider, MD  esomeprazole (NEXIUM) 40 MG capsule Take 40 mg by mouth daily.    Yes Historical Provider, MD  fenofibrate (TRICOR) 145 MG tablet Take 145 mg by mouth daily.   Yes Historical Provider, MD  fluticasone (FLONASE) 50 MCG/ACT nasal spray Place 1 spray into both nostrils daily. 01/28/17  Yes Manon Hilding Kefalas, PA-C  furosemide (LASIX) 40 MG tablet Take 1 tablet (40 mg total) by mouth daily. 11/23/16  Yes Thurnell Lose, MD  glipiZIDE (GLUCOTROL)  10 MG tablet Take 10 mg by mouth 2 (two) times daily.    Yes Historical Provider, MD  HYDROcodone-acetaminophen (NORCO) 10-325 MG per tablet Take 1 tablet by mouth every 4 (four) hours as needed for moderate pain.    Yes Historical Provider, MD  insulin aspart (NOVOLOG FLEXPEN) 100 UNIT/ML FlexPen Inject 16 Units into the skin 3 (three) times daily with meals.    Yes Historical Provider, MD  Insulin Degludec (TRESIBA FLEXTOUCH) 200 UNIT/ML SOPN Inject 16 Units into the skin at bedtime.   Yes Historical Provider, MD  PARoxetine (PAXIL) 40 MG tablet Take 40 mg by mouth daily.    Yes Historical Provider, MD  rosuvastatin (CRESTOR) 40 MG tablet Take 1 tablet (40 mg total) by mouth at bedtime. 07/20/15  Yes Herminio Commons, MD  pregabalin (LYRICA) 50 MG capsule Take 50 mg by mouth 3 (three) times daily.     Historical Provider, MD  raloxifene (EVISTA) 60 MG tablet Take 60 mg by mouth daily.    Historical Provider, MD    Family History Family History  Problem Relation Age of Onset  . Alzheimer's disease Mother   . Heart attack Father   . Aneurysm Sister   . Coronary artery disease Brother     Social History Social History  Substance Use Topics  . Smoking status: Former Smoker    Packs/day: 1.00    Years: 30.00    Types: Cigarettes    Quit date: 41  . Smokeless tobacco: Never Used  . Alcohol use No     Allergies   Mold extract [trichophyton mentagrophyte]; Morphine and related; Niaspan [niacin]; and Pollen extract   Review of Systems Review of Systems  Unable to perform ROS: Mental status change     Physical Exam Updated Vital Signs BP 94/68   Pulse 70   Temp 97.5 F (36.4 C) (Oral)   Resp 17   Ht $R'5\' 11"'na$  (1.803 m)   Wt 223 lb (101.2 kg)   SpO2 94%   BMI 31.10 kg/m   Physical Exam  Constitutional: She appears well-developed and well-nourished. No distress.  HENT:  Head: Normocephalic and atraumatic.  Mouth/Throat: Oropharynx is clear and moist.  No obvious  head trauma. Oropharynx is clear.  Eyes: EOM are normal. Pupils are equal, round, and reactive to light.  No nystagmus.  Neck: Normal range of motion. Neck supple.  No posterior midline cervical tenderness palpation.  Cardiovascular: Normal rate and regular rhythm.  Exam reveals no gallop and no friction rub.   No murmur heard. Pulmonary/Chest: Effort normal and breath sounds normal. No respiratory distress. She has no wheezes. She has no rales. She exhibits no tenderness.  Abdominal: Soft. Bowel sounds are normal. There is no tenderness. There is no rebound  and no guarding.  Musculoskeletal: Normal range of motion. She exhibits no edema or tenderness.  No midline thoracic or lumbar tenderness. Pelvis is stable.  Neurological: She is alert.  Confused. Follow simple commands. 4/5 motor in all extremities. Sensation fully intact.  Skin: Skin is warm and dry. Capillary refill takes less than 2 seconds. No rash noted. No erythema.  Psychiatric:  Repetitive.   Nursing note and vitals reviewed.    ED Treatments / Results  Labs (all labs ordered are listed, but only abnormal results are displayed) Labs Reviewed  CBC WITH DIFFERENTIAL/PLATELET - Abnormal; Notable for the following:       Result Value   RBC 3.02 (*)    Hemoglobin 8.6 (*)    HCT 26.2 (*)    All other components within normal limits  COMPREHENSIVE METABOLIC PANEL - Abnormal; Notable for the following:    Potassium 3.2 (*)    Glucose, Bld 204 (*)    BUN 87 (*)    Creatinine, Ser 2.97 (*)    Calcium 8.6 (*)    Total Protein 6.3 (*)    Albumin 2.5 (*)    ALT 12 (*)    Alkaline Phosphatase 33 (*)    GFR calc non Af Amer 14 (*)    GFR calc Af Amer 16 (*)    All other components within normal limits  CBG MONITORING, ED - Abnormal; Notable for the following:    Glucose-Capillary 36 (*)    All other components within normal limits  CBG MONITORING, ED - Abnormal; Notable for the following:    Glucose-Capillary 133 (*)     All other components within normal limits  CBG MONITORING, ED - Abnormal; Notable for the following:    Glucose-Capillary 134 (*)    All other components within normal limits  CBG MONITORING, ED - Abnormal; Notable for the following:    Glucose-Capillary 112 (*)    All other components within normal limits  PROTIME-INR  TROPONIN I  URINALYSIS, ROUTINE W REFLEX MICROSCOPIC  I-STAT CG4 LACTIC ACID, ED    EKG  EKG Interpretation  Date/Time:  Friday February 15 2017 19:01:08 EDT Ventricular Rate:  62 PR Interval:    QRS Duration: 86 QT Interval:  464 QTC Calculation: 472 R Axis:   31 Text Interpretation:  Sinus rhythm Abnormal R-wave progression, early transition Confirmed by Lita Mains  MD, Brytney Somes ([id number]) on 02/15/2017 7:33:56 PM       Radiology Ct Head Wo Contrast  Result Date: 02/15/2017 CLINICAL DATA:  83 year old female with altered mental status. EXAM: CT HEAD WITHOUT CONTRAST TECHNIQUE: Contiguous axial images were obtained from the base of the skull through the vertex without intravenous contrast. COMPARISON:  Head CT dated 12/02/2011 FINDINGS: Brain: There is moderate age-related atrophy and chronic microvascular ischemic changes. There is no acute intracranial hemorrhage. No mass effect or midline shift noted. There is no extra-axial fluid collection. Vascular: No hyperdense vessel or unexpected calcification. Skull: Normal. Negative for fracture or focal lesion. Sinuses/Orbits: There is mild mucoperiosteal thickening of paranasal sinuses. No air-fluid levels. The mastoid air cells are clear. Other: None IMPRESSION: No acute intracranial hemorrhage. Age-related atrophy and chronic microvascular ischemic disease. If symptoms persist and there are no contraindications, MRI may provide better evaluation if clinically indicated. Electronically Signed   By: Anner Crete M.D.   On: 02/15/2017 20:18   Dg Chest Port 1 View  Result Date: 02/15/2017 CLINICAL DATA:  83 year old female  with altered mental status. Patient fell on the  right side. History of breast cancer and mastectomy. EXAM: PORTABLE CHEST 1 VIEW COMPARISON:  Chest radiograph dated 12/26/2016 FINDINGS: Left subclavian central venous line with tip over central SVC in stable positioning. There is cardiomegaly with interval development of vascular congestion and mild edema. Patchy area of hazy density in the left mid to lower lung fields likely involving the left upper lobe and lingula may be related to edema or pneumonia. Clinical correlation is recommended. Lymphatic spread of tumor is not entirely excluded in this patient with history of breast cancer. A small left pleural effusion may be present. There is no pneumothorax. No acute osseous pathology identified. Right axillary surgical clips noted. IMPRESSION: Cardiomegaly with interval development of congestive changes and interstitial edema. Superimposed pneumonia involving the left upper lobe/ lingula or lymphangitic spread of tumor are not excluded. Clinical correlation and follow-up is recommended. Electronically Signed   By: Anner Crete M.D.   On: 02/15/2017 20:37    Procedures Procedures (including critical care time)  Medications Ordered in ED Medications  sodium chloride 0.9 % bolus 500 mL (0 mLs Intravenous Stopped 02/15/17 2036)  dextrose 50 % solution (50 mLs  Given 02/15/17 2036)     Initial Impression / Assessment and Plan / ED Course  I have reviewed the triage vital signs and the nursing notes.  Pertinent labs & imaging results that were available during my care of the patient were reviewed by me and considered in my medical decision making (see chart for details).   patient is Noted to be hypoglycemic. Given bolus of D50. Improved mental status. CT head without acute findings. Pulmonary edema on chest x-ray.  Patient is now much more coherent. Denies recent fever or chills. Denies shortness of breath or cough. Maintaining saturations in the  mid 90s on room air. She is in no respiratory distress. Mild elevation of her creatinine over baseline. Discussed with Dr. Shanon Brow. Will admit to observation bed.  Final Clinical Impressions(s) / ED Diagnoses   Final diagnoses:  Hypoglycemia  Fall in home, initial encounter  Confusion    New Prescriptions New Prescriptions   No medications on file     Julianne Rice, MD 02/15/17 2312

## 2017-02-15 NOTE — ED Notes (Signed)
MD [place] notified of CBG, per MD check CBG in 30 minutes.

## 2017-02-15 NOTE — ED Triage Notes (Signed)
Pt reports she fell today on R side, denies hitting head or LOC. Also states dizziness, cannot stater when this started. Repeatedly states "I don't know" to several questions.

## 2017-02-15 NOTE — ED Notes (Signed)
MD Vanita Panda notified of CBG 36.

## 2017-02-16 DIAGNOSIS — G9341 Metabolic encephalopathy: Secondary | ICD-10-CM

## 2017-02-16 DIAGNOSIS — I10 Essential (primary) hypertension: Secondary | ICD-10-CM | POA: Diagnosis not present

## 2017-02-16 DIAGNOSIS — N189 Chronic kidney disease, unspecified: Secondary | ICD-10-CM | POA: Diagnosis not present

## 2017-02-16 DIAGNOSIS — M797 Fibromyalgia: Secondary | ICD-10-CM

## 2017-02-16 DIAGNOSIS — E162 Hypoglycemia, unspecified: Secondary | ICD-10-CM

## 2017-02-16 DIAGNOSIS — L8990 Pressure ulcer of unspecified site, unspecified stage: Secondary | ICD-10-CM | POA: Insufficient documentation

## 2017-02-16 LAB — CBG MONITORING, ED: Glucose-Capillary: 76 mg/dL (ref 65–99)

## 2017-02-16 LAB — CBC
HCT: 27.2 % — ABNORMAL LOW (ref 36.0–46.0)
HEMOGLOBIN: 8.7 g/dL — AB (ref 12.0–15.0)
MCH: 27.9 pg (ref 26.0–34.0)
MCHC: 32 g/dL (ref 30.0–36.0)
MCV: 87.2 fL (ref 78.0–100.0)
PLATELETS: 377 10*3/uL (ref 150–400)
RBC: 3.12 MIL/uL — AB (ref 3.87–5.11)
RDW: 13.8 % (ref 11.5–15.5)
WBC: 8.4 10*3/uL (ref 4.0–10.5)

## 2017-02-16 LAB — GLUCOSE, CAPILLARY
GLUCOSE-CAPILLARY: 218 mg/dL — AB (ref 65–99)
GLUCOSE-CAPILLARY: 287 mg/dL — AB (ref 65–99)
Glucose-Capillary: 119 mg/dL — ABNORMAL HIGH (ref 65–99)
Glucose-Capillary: 294 mg/dL — ABNORMAL HIGH (ref 65–99)
Glucose-Capillary: 309 mg/dL — ABNORMAL HIGH (ref 65–99)
Glucose-Capillary: 229 mg/dL — ABNORMAL HIGH (ref 65–99)

## 2017-02-16 LAB — BASIC METABOLIC PANEL
ANION GAP: 11 (ref 5–15)
BUN: 80 mg/dL — ABNORMAL HIGH (ref 6–20)
CHLORIDE: 101 mmol/L (ref 101–111)
CO2: 26 mmol/L (ref 22–32)
CREATININE: 2.8 mg/dL — AB (ref 0.44–1.00)
Calcium: 9 mg/dL (ref 8.9–10.3)
GFR calc non Af Amer: 15 mL/min — ABNORMAL LOW (ref >60)
GFR, EST AFRICAN AMERICAN: 17 mL/min — AB (ref >60)
Glucose, Bld: 307 mg/dL — ABNORMAL HIGH (ref 65–99)
Potassium: 4.9 mmol/L (ref 3.5–5.1)
SODIUM: 138 mmol/L (ref 135–145)

## 2017-02-16 LAB — MRSA PCR SCREENING: MRSA BY PCR: NEGATIVE

## 2017-02-16 MED ORDER — ASPIRIN EC 81 MG PO TBEC
81.0000 mg | DELAYED_RELEASE_TABLET | Freq: Every day | ORAL | Status: DC
  Administered 2017-02-16 – 2017-02-17 (×2): 81 mg via ORAL
  Filled 2017-02-16 (×2): qty 1

## 2017-02-16 MED ORDER — FUROSEMIDE 40 MG PO TABS
40.0000 mg | ORAL_TABLET | Freq: Every day | ORAL | Status: DC
  Administered 2017-02-16 – 2017-02-17 (×2): 40 mg via ORAL
  Filled 2017-02-16 (×2): qty 1

## 2017-02-16 MED ORDER — SODIUM CHLORIDE 0.9% FLUSH
3.0000 mL | Freq: Two times a day (BID) | INTRAVENOUS | Status: DC
  Administered 2017-02-16 (×2): 3 mL via INTRAVENOUS

## 2017-02-16 MED ORDER — SODIUM CHLORIDE 0.9% FLUSH: 3.0000 mL | INTRAVENOUS | Status: DC | PRN

## 2017-02-16 MED ORDER — PAROXETINE HCL 20 MG PO TABS
40.0000 mg | ORAL_TABLET | Freq: Every day | ORAL | Status: DC
  Administered 2017-02-16 – 2017-02-17 (×2): 40 mg via ORAL
  Filled 2017-02-16 (×2): qty 2

## 2017-02-16 MED ORDER — INSULIN ASPART 100 UNIT/ML ~~LOC~~ SOLN
4.0000 [IU] | Freq: Three times a day (TID) | SUBCUTANEOUS | Status: DC
  Administered 2017-02-16 – 2017-02-17 (×3): 4 [IU] via SUBCUTANEOUS

## 2017-02-16 MED ORDER — INSULIN GLARGINE 100 UNIT/ML ~~LOC~~ SOLN
12.0000 [IU] | Freq: Every day | SUBCUTANEOUS | Status: DC
  Administered 2017-02-16 – 2017-02-17 (×2): 12 [IU] via SUBCUTANEOUS
  Filled 2017-02-16 (×4): qty 0.12

## 2017-02-16 MED ORDER — DICLOFENAC SODIUM 1 % TD GEL
TRANSDERMAL | Status: AC
  Filled 2017-02-16: qty 100

## 2017-02-16 MED ORDER — LINAGLIPTIN 5 MG PO TABS
5.0000 mg | ORAL_TABLET | Freq: Every day | ORAL | Status: DC
  Administered 2017-02-16 – 2017-02-17 (×2): 5 mg via ORAL
  Filled 2017-02-16 (×2): qty 1

## 2017-02-16 MED ORDER — ROSUVASTATIN CALCIUM 20 MG PO TABS
40.0000 mg | ORAL_TABLET | Freq: Every day | ORAL | Status: DC
  Administered 2017-02-16: 40 mg via ORAL
  Filled 2017-02-16: qty 2

## 2017-02-16 MED ORDER — POTASSIUM CHLORIDE CRYS ER 20 MEQ PO TBCR
40.0000 meq | EXTENDED_RELEASE_TABLET | Freq: Once | ORAL | Status: DC
  Filled 2017-02-16: qty 2

## 2017-02-16 MED ORDER — ALBUTEROL SULFATE (2.5 MG/3ML) 0.083% IN NEBU: 3.0000 mL | INHALATION_SOLUTION | Freq: Four times a day (QID) | RESPIRATORY_TRACT | Status: DC | PRN

## 2017-02-16 MED ORDER — INSULIN ASPART 100 UNIT/ML ~~LOC~~ SOLN
0.0000 [IU] | Freq: Three times a day (TID) | SUBCUTANEOUS | Status: DC
  Administered 2017-02-16 (×2): 7 [IU] via SUBCUTANEOUS
  Administered 2017-02-16 – 2017-02-17 (×2): 5 [IU] via SUBCUTANEOUS
  Administered 2017-02-17: 3 [IU] via SUBCUTANEOUS

## 2017-02-16 MED ORDER — SODIUM CHLORIDE 0.9 % IV SOLN: 250.0000 mL | INTRAVENOUS | Status: DC | PRN

## 2017-02-16 MED ORDER — HYDROCODONE-ACETAMINOPHEN 10-325 MG PO TABS
1.0000 | ORAL_TABLET | ORAL | Status: DC | PRN
  Administered 2017-02-16: 1 via ORAL
  Filled 2017-02-16: qty 1

## 2017-02-16 MED ORDER — PREGABALIN 50 MG PO CAPS
50.0000 mg | ORAL_CAPSULE | Freq: Three times a day (TID) | ORAL | Status: DC
  Administered 2017-02-16 – 2017-02-17 (×4): 50 mg via ORAL
  Filled 2017-02-16 (×4): qty 1

## 2017-02-16 MED ORDER — DICLOFENAC SODIUM 1 % TD GEL
2.0000 g | Freq: Three times a day (TID) | TRANSDERMAL | Status: DC | PRN
  Administered 2017-02-16: 2 g via TOPICAL
  Filled 2017-02-16: qty 100

## 2017-02-16 NOTE — Progress Notes (Signed)
02/15/2017  6:46 PM  02/16/2017 12:14 PM  [name] was seen and examined.  The H&P by the admitting provider , orders, imaging was reviewed.  Please see orders.  Will continue to follow.   Murvin Natal, MD [facility]

## 2017-02-16 NOTE — H&P (Signed)
History and Physical    [name] AVW:[id number] DOB: 1933-07-04 DOA: 02/15/2017  PCP: Cassell Smiles., MD  Patient coming from: home  Chief Complaint:  Low sugar  HPI: [name] is a 83 y.o. female with medical history significant of IDDM, CVA, aflutter, CKD comes in after feeling "whoozy" and her sugar was low at home.  She denies any recent illnesses.  No falling.  No fevers.  No n/v/d.  Her appetite was poor yesterday and she didn't eat very well.  No pain anywhere.  Her sugar was in the 30 in the ED.  It has improved with d50.  Pt referred for admission to ensure her hypoglycemia will resolve completely.  She has not been given any solid food yet in the ED tonight or on the floor.    Review of Systems: As per HPI otherwise 10 point review of systems negative.   Past Medical History:  Diagnosis Date  . Anemia   . Aortic stenosis    Mild  . Arthritis   . Asthma   . Atrial flutter (HCC) 2002  . B12 deficiency 06/20/2015  . B12 deficiency 06/20/2015  . Breast carcinoma (HCC)   . Cerebrovascular disease    with a 60% LICA; repeat study in 10/2009-no obstructive disease; modest ASVD  . Chronic kidney disease    Creatinine-1.5 in 2010 and 2.5-3 in 2011; 1.5-10/2010;  Klebsiella UTI-10/2010. urine protein 36 mg/dl, mildly elevated  . Decreased bone density   . Depression   . Diabetes mellitus   . Diabetic retinopathy   . DJD (degenerative joint disease) 11/14/2011  . Falls infrequently 01/2010   fracture of pelvis and left humerus  . Fibromyalgia   . Headaches, cluster   . Hyperlipidemia   . Hypertension   . IBS (irritable bowel syndrome)    diverticulosis, gastroesophageal reflux disease  . Lower back pain   . Obesity 11/14/2011    Past Surgical History:  Procedure Laterality Date  . APPENDECTOMY    . CATARACT EXTRACTION, BILATERAL     Lens implants  . CENTRAL VENOUS CATHETER TUNNELED INSERTION SINGLE LUMEN    . CHOLECYSTECTOMY    . COLONOSCOPY  2012  . EYE  SURGERY  nov 2012   for diabetic retinopathy  . LUMBAR DISC SURGERY     lumbosacral spine procedure x 2  . MASTECTOMY     Right breast for carcinoma     reports that she quit smoking about 36 years ago. Her smoking use included Cigarettes. She has a 30.00 pack-year smoking history. She has never used smokeless tobacco. She reports that she does not drink alcohol or use drugs.  Allergies  Allergen Reactions  . Mold Extract [Trichophyton Mentagrophyte] Other (See Comments)    Reaction:  Itchy, watery eyes/sneezing   . Morphine And Related Other (See Comments)    Reaction:  Drowsiness. "I didn't wake up for almost 1 week" after taking morphine.   . Niaspan [Niacin] Other (See Comments)    Reaction:  Flushing   . Pollen Extract Other (See Comments)    Reaction:  Itchy, watery eyes/sneezing     Family History  Problem Relation Age of Onset  . Alzheimer's disease Mother   . Heart attack Father   . Aneurysm Sister   . Coronary artery disease Brother     Prior to Admission medications   Medication Sig Start Date End Date Taking? Authorizing Provider  albuterol (PROAIR HFA) 108 (90 BASE) MCG/ACT inhaler Inhale 2 puffs into  the lungs every 6 (six) hours as needed for wheezing or shortness of breath. 06/16/14  Yes Ellouise Newer, PA-C  aspirin EC 81 MG tablet Take 81 mg by mouth daily.   Yes Historical Provider, MD  cyanocobalamin (,VITAMIN B-12,) 1000 MCG/ML injection Inject 1,000 mcg into the muscle every 30 (thirty) days.   Yes Historical Provider, MD  Diclofenac Sodium 3 % GEL Apply 1 application topically 3 (three) times daily. 01/30/17  Yes Historical Provider, MD  esomeprazole (NEXIUM) 40 MG capsule Take 40 mg by mouth daily.    Yes Historical Provider, MD  fenofibrate (TRICOR) 145 MG tablet Take 145 mg by mouth daily.   Yes Historical Provider, MD  fluticasone (FLONASE) 50 MCG/ACT nasal spray Place 1 spray into both nostrils daily. 01/28/17  Yes Maurine Minister Kefalas, PA-C  furosemide  (LASIX) 40 MG tablet Take 1 tablet (40 mg total) by mouth daily. 11/23/16  Yes Leroy Sea, MD  glipiZIDE (GLUCOTROL) 10 MG tablet Take 10 mg by mouth 2 (two) times daily.    Yes Historical Provider, MD  HYDROcodone-acetaminophen (NORCO) 10-325 MG per tablet Take 1 tablet by mouth every 4 (four) hours as needed for moderate pain.    Yes Historical Provider, MD  insulin aspart (NOVOLOG FLEXPEN) 100 UNIT/ML FlexPen Inject 16 Units into the skin 3 (three) times daily with meals.    Yes Historical Provider, MD  Insulin Degludec (TRESIBA FLEXTOUCH) 200 UNIT/ML SOPN Inject 16 Units into the skin at bedtime.   Yes Historical Provider, MD  PARoxetine (PAXIL) 40 MG tablet Take 40 mg by mouth daily.    Yes Historical Provider, MD  rosuvastatin (CRESTOR) 40 MG tablet Take 1 tablet (40 mg total) by mouth at bedtime. 07/20/15  Yes Laqueta Linden, MD  pregabalin (LYRICA) 50 MG capsule Take 50 mg by mouth 3 (three) times daily.     Historical Provider, MD  raloxifene (EVISTA) 60 MG tablet Take 60 mg by mouth daily.    Historical Provider, MD    Physical Exam: Vitals:   02/15/17 2100 02/15/17 2130 02/15/17 2239 02/15/17 2300  BP: 120/90 135/69 94/68 (!) 119/51  Pulse: 70   65  Resp: 18 16 17 17   Temp:      TempSrc:      SpO2: 98%  94% 92%  Weight:      Height:        Constitutional: NAD, calm, comfortable Vitals:   02/15/17 2100 02/15/17 2130 02/15/17 2239 02/15/17 2300  BP: 120/90 135/69 94/68 (!) 119/51  Pulse: 70   65  Resp: 18 16 17 17   Temp:      TempSrc:      SpO2: 98%  94% 92%  Weight:      Height:       Eyes: PERRL, lids and conjunctivae normal ENMT: Mucous membranes are moist. Posterior pharynx clear of any exudate or lesions.Normal dentition.  Neck: normal, supple, no masses, no thyromegaly Respiratory: clear to auscultation bilaterally, no wheezing, no crackles. Normal respiratory effort. No accessory muscle use.  Cardiovascular: Regular rate and rhythm, no murmurs / rubs  / gallops. No extremity edema. 2+ pedal pulses. No carotid bruits.  Abdomen: no tenderness, no masses palpated. No hepatosplenomegaly. Bowel sounds positive.  Musculoskeletal: no clubbing / cyanosis. No joint deformity upper and lower extremities. Good ROM, no contractures. Normal muscle tone.  Skin: no rashes, lesions, ulcers. No induration Neurologic: CN 2-12 grossly intact. Sensation intact, DTR normal. Strength 5/5 in all 4.  Psychiatric:  Normal judgment and insight. Alert and oriented x 3. Normal mood.    Labs on Admission: I have personally reviewed following labs and imaging studies  CBC:  Recent Labs Lab 02/15/17 2029  WBC 8.6  NEUTROABS 7.0  HGB 8.6*  HCT 26.2*  MCV 86.8  PLT 342   Basic Metabolic Panel:  Recent Labs Lab 02/15/17 2029  NA 136  K 3.2*  CL 101  CO2 26  GLUCOSE 204*  BUN 87*  CREATININE 2.97*  CALCIUM 8.6*   GFR: Estimated Creatinine Clearance: 19.5 mL/min (A) (by C-G formula based on SCr of 2.97 mg/dL (H)). Liver Function Tests:  Recent Labs Lab 02/15/17 2029  AST 18  ALT 12*  ALKPHOS 33*  BILITOT 0.3  PROT 6.3*  ALBUMIN 2.5*   Coagulation Profile:  Recent Labs Lab 02/15/17 2029  INR 1.13   Cardiac Enzymes:  Recent Labs Lab 02/15/17 2029  TROPONINI <0.03   CBG:  Recent Labs Lab 02/15/17 2031 02/15/17 2040 02/15/17 2107 02/15/17 2154  GLUCAP 36* 133* 134* 112*   Urine analysis:    Component Value Date/Time   COLORURINE YELLOW 02/15/2017 2215   APPEARANCEUR CLEAR 02/15/2017 2215   LABSPEC 1.015 02/15/2017 2215   PHURINE 5.5 02/15/2017 2215   GLUCOSEU NEGATIVE 02/15/2017 2215   GLUCOSEU neg 04/07/2010   HGBUR NEGATIVE 02/15/2017 2215   BILIRUBINUR NEGATIVE 02/15/2017 2215   KETONESUR NEGATIVE 02/15/2017 2215   PROTEINUR NEGATIVE 02/15/2017 2215   UROBILINOGEN 0.2 09/27/2014 1630   NITRITE NEGATIVE 02/15/2017 2215   LEUKOCYTESUR NEGATIVE 02/15/2017 2215   Radiological Exams on Admission: Ct Head Wo  Contrast  Result Date: 02/15/2017 CLINICAL DATA:  83 year old female with altered mental status. EXAM: CT HEAD WITHOUT CONTRAST TECHNIQUE: Contiguous axial images were obtained from the base of the skull through the vertex without intravenous contrast. COMPARISON:  Head CT dated 12/02/2011 FINDINGS: Brain: There is moderate age-related atrophy and chronic microvascular ischemic changes. There is no acute intracranial hemorrhage. No mass effect or midline shift noted. There is no extra-axial fluid collection. Vascular: No hyperdense vessel or unexpected calcification. Skull: Normal. Negative for fracture or focal lesion. Sinuses/Orbits: There is mild mucoperiosteal thickening of paranasal sinuses. No air-fluid levels. The mastoid air cells are clear. Other: None IMPRESSION: No acute intracranial hemorrhage. Age-related atrophy and chronic microvascular ischemic disease. If symptoms persist and there are no contraindications, MRI may provide better evaluation if clinically indicated. Electronically Signed   By: Elgie Collard M.D.   On: 02/15/2017 20:18   Dg Chest Port 1 View  Result Date: 02/15/2017 CLINICAL DATA:  83 year old female with altered mental status. Patient fell on the right side. History of breast cancer and mastectomy. EXAM: PORTABLE CHEST 1 VIEW COMPARISON:  Chest radiograph dated 12/26/2016 FINDINGS: Left subclavian central venous line with tip over central SVC in stable positioning. There is cardiomegaly with interval development of vascular congestion and mild edema. Patchy area of hazy density in the left mid to lower lung fields likely involving the left upper lobe and lingula may be related to edema or pneumonia. Clinical correlation is recommended. Lymphatic spread of tumor is not entirely excluded in this patient with history of breast cancer. A small left pleural effusion may be present. There is no pneumothorax. No acute osseous pathology identified. Right axillary surgical clips  noted. IMPRESSION: Cardiomegaly with interval development of congestive changes and interstitial edema. Superimposed pneumonia involving the left upper lobe/ lingula or lymphangitic spread of tumor are not excluded. Clinical correlation and follow-up is  recommended. Electronically Signed   By: Elgie Collard M.D.   On: 02/15/2017 20:37    Assessment/Plan 83 yo female h/o IDDM comes in with confusion/whoozy and hypoglycemia  Principal Problem:   Hypoglycemia- feed now.  Check glucose q 2 hours.  Hold oral dm meds but cont her long acting insulin.  Was actually hospitalized recently for hyperglycemia FYI.  This episode may be due to decreased po intake in the last day or so.  Active Problems:   Acute metabolic encephalopathy- resolved with improvement of her glucose level   Type 2 DM with CKD stage 4 and hypertension (HCC)- noted, as above   Atrial fibrillation (HCC)- noted   Essential hypertension- stable   Chronic kidney disease- stable, mildly worse than normal   Fibromyalgia- noted    DVT prophylaxis:  scds Code Status:  full Family Communication:  none Disposition Plan:  Per day team Consults called:  none Admission status:  observation   Manhattan Mccuen A MD [facility]  If 7PM-7AM, please contact night-coverage [email or web address] Password [facility]  02/16/2017, 12:46 AM

## 2017-02-16 NOTE — ED Notes (Signed)
Pt given apple juice  

## 2017-02-17 DIAGNOSIS — I129 Hypertensive chronic kidney disease with stage 1 through stage 4 chronic kidney disease, or unspecified chronic kidney disease: Secondary | ICD-10-CM | POA: Diagnosis not present

## 2017-02-17 DIAGNOSIS — I10 Essential (primary) hypertension: Secondary | ICD-10-CM

## 2017-02-17 DIAGNOSIS — I482 Chronic atrial fibrillation: Secondary | ICD-10-CM

## 2017-02-17 DIAGNOSIS — N189 Chronic kidney disease, unspecified: Secondary | ICD-10-CM

## 2017-02-17 DIAGNOSIS — N184 Chronic kidney disease, stage 4 (severe): Secondary | ICD-10-CM | POA: Diagnosis not present

## 2017-02-17 DIAGNOSIS — E1122 Type 2 diabetes mellitus with diabetic chronic kidney disease: Secondary | ICD-10-CM | POA: Diagnosis not present

## 2017-02-17 DIAGNOSIS — E162 Hypoglycemia, unspecified: Secondary | ICD-10-CM | POA: Diagnosis not present

## 2017-02-17 LAB — GLUCOSE, CAPILLARY
Glucose-Capillary: 217 mg/dL — ABNORMAL HIGH (ref 65–99)
Glucose-Capillary: 220 mg/dL — ABNORMAL HIGH (ref 65–99)
Glucose-Capillary: 266 mg/dL — ABNORMAL HIGH (ref 65–99)

## 2017-02-17 MED ORDER — INSULIN DEGLUDEC 200 UNIT/ML ~~LOC~~ SOPN: 12.0000 [IU] | PEN_INJECTOR | Freq: Every day | SUBCUTANEOUS | Status: DC

## 2017-02-17 MED ORDER — INSULIN ASPART 100 UNIT/ML FLEXPEN: 8.0000 [IU] | PEN_INJECTOR | Freq: Three times a day (TID) | SUBCUTANEOUS | 11 refills | Status: DC

## 2017-02-17 MED ORDER — ALUM & MAG HYDROXIDE-SIMETH 200-200-20 MG/5ML PO SUSP
30.0000 mL | ORAL | Status: DC | PRN
  Administered 2017-02-17: 30 mL via ORAL
  Filled 2017-02-17: qty 30

## 2017-02-17 NOTE — Progress Notes (Signed)
Patient had c/o hip pain stemming from her fall before admission.  Staff told patient that they would  attempt to have MD to order pain medication that patient takes prn, and diclofenac gel ordered prn.  Patient then stated that she had her own medication that she had been taking from home, and that she had "hidden it."  Explained to patient that home medication could not be taken by patient as she desired.  Received order from MD to patient's pain medication and diclofenac, and made a verbal agreement with patient to not take her own pain medication.  Patient was very upset, stating it was her medication, and she could use it if she wanted.  Staff RN informed patient that she could not.  Encouraged patient to disclose where home medication was.  Patient showed staff medication, and attempted to take home med in front of staff.  Staff removed medication from patient (Hydrocone 10-325 16 white oval tabs in bottle) , and placed back in pocket book, which was placed in a patient belonging bag.  Patient very adamant that her personal belongings not be removed from room. Patient insistent that things she came with not taken from room, patient became very upset. Again, made another verbal agreement with patient not to take meds from home, which she agreed to.  Patient is alert and oriented x 4. Belongings removed from patient, and bed alarm placed on patient to hear when patient is attempting to get out of bed to alert staff to patient movement.  Patient has been complaint with agreement through the rest of the shift.  Patient has been given pain medication as request, and diclofenac given as well.

## 2017-02-17 NOTE — Discharge Summary (Signed)
Physician Discharge Summary  [name] IRC:[id number] DOB: Mar 26, 1933 DOA: 02/15/2017  PCP: [name]., MD  Admit date: 02/15/2017 Discharge date: 02/17/2017  Admitted From: Home  Disposition:  Home  Recommendations for Outpatient Follow-up:  1. Follow up with PCP in 1 weeks 2. Please establish care with an endocrinologist / diabetologist asap  3. Please consider avoiding sulfonylureas in this patient  Discharge Condition: STaBLE CODE STATUS: FULL   Brief/Interim Summary: HPI: [name] is a 83 y.o. female with medical history significant of IDDM, CVA, aflutter, CKD comes in after feeling "whoozy" and her sugar was low at home.  She denies any recent illnesses.  No falling.  No fevers.  No n/v/d.  Her appetite was poor yesterday and she didn't eat very well.  No pain anywhere.  Her sugar was in the 30 in the ED.  It has improved with d50.  Pt referred for admission to ensure her hypoglycemia will resolve completely.  She has not been given any solid food yet in the ED tonight or on the floor.   The patient was admitted into the hospital and the glipizide was stopped. The blood glucose was monitored closely and improved with no further hypoglycemia. I don't think it is safe for her to continue taking glipizide at this point as this is her second recent admission for hypoglycemia. I'm also concerned that she's not taking her insulin correctly. I have decreased all of her insulin doses prior to discharge. I would like for her to go see an endocrinologist for diabetologist as soon as possible. I've recommended that she see her PCP within a week. The patient verbalized understanding. I would recommend avoiding sulfonylureas in this patient given her chronic kidney disease she is at very high risk for hypoglycemia related to that. In addition I reduced the extra long-acting glargine dose because of her chronic kidney disease.  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 "I didn't wake up for almost 1 week" after taking morphine.    Niaspan [niacin] Other (See Comments)   Reaction:  Flushing    Pollen Extract Other (See Comments)   Reaction:  Itchy, watery eyes/sneezing       Medication List    STOP taking these medications   glipiZIDE 10 MG tablet Commonly known as:  GLUCOTROL     TAKE these medications   albuterol 108 (90 Base) MCG/ACT inhaler Commonly known as:  PROAIR HFA Inhale 2 puffs into the lungs every 6 (six) hours as needed for wheezing or shortness of breath.   aspirin EC 81 MG tablet Take 81 mg by mouth daily.   cyanocobalamin 1000 MCG/ML injection Commonly known as:  (VITAMIN B-12) Inject 1,000 mcg into the muscle every 30 (thirty) days.   Diclofenac Sodium 3 % Gel Apply 1 application topically 3 (three) times daily.   esomeprazole 40 MG capsule  Commonly known as:  NEXIUM Take 40 mg by mouth daily.   fenofibrate 145 MG tablet Commonly known as:  TRICOR Take 145 mg by mouth daily.   fluticasone 50 MCG/ACT nasal spray Commonly known as:  FLONASE Place 1 spray into both nostrils daily.   furosemide 40 MG tablet Commonly known as:  LASIX Take 1 tablet (40 mg total) by mouth daily.   HYDROcodone-acetaminophen 10-325 MG tablet Commonly known as:  NORCO Take 1 tablet by mouth every 4 (four) hours as needed for moderate pain.   insulin aspart 100 UNIT/ML  FlexPen Commonly known as:  NOVOLOG FLEXPEN Inject 8 Units into the skin 3 (three) times daily with meals. What changed:  how much to take   Insulin Degludec 200 UNIT/ML Sopn Commonly known as:  TRESIBA FLEXTOUCH Inject 12 Units into the skin at bedtime. What changed:  how much to take   PARoxetine 40 MG tablet Commonly known as:  PAXIL Take 40 mg by mouth daily.   pregabalin 50 MG capsule Commonly known as:  LYRICA Take 50 mg by mouth 3 (three) times daily.   raloxifene 60 MG tablet Commonly known as:  EVISTA Take 60 mg by mouth daily.   rosuvastatin 40 MG tablet Commonly known as:  CRESTOR Take 1 tablet (40 mg total) by mouth at bedtime.      Follow-up Information    FUSCO,LAWRENCE J., MD Follow up in 1 week(s).   Specialty:  Internal Medicine Contact information: [address] [place] [address] [phone]          Allergies  Allergen Reactions  . Mold Extract [Trichophyton Mentagrophyte] Other (See Comments)    Reaction:  Itchy, watery eyes/sneezing   . Morphine And Related Other (See Comments)    Reaction:  Drowsiness. "I didn't wake up for almost 1 week" after taking morphine.   . Niaspan [Niacin] Other (See Comments)    Reaction:  Flushing   . Pollen Extract Other (See Comments)    Reaction:  Itchy, watery eyes/sneezing    Procedures/Studies: Ct Head Wo Contrast  Result Date: 02/15/2017 CLINICAL DATA:  84 year old female with altered mental status. EXAM: CT HEAD WITHOUT CONTRAST TECHNIQUE: Contiguous axial images were obtained from the base of the skull through the vertex without intravenous contrast. COMPARISON:  Head CT dated 12/02/2011 FINDINGS: Brain: There is moderate age-related atrophy and chronic microvascular ischemic changes. There is no acute intracranial hemorrhage. No mass effect or midline shift noted. There is no extra-axial fluid collection. Vascular: No hyperdense vessel or unexpected calcification. Skull: Normal. Negative for  fracture or focal lesion. Sinuses/Orbits: There is mild mucoperiosteal thickening of paranasal sinuses. No air-fluid levels. The mastoid air cells are clear. Other: None IMPRESSION: No acute intracranial hemorrhage. Age-related atrophy and chronic microvascular ischemic disease. If symptoms persist and there are no contraindications, MRI may provide better evaluation if clinically indicated. Electronically Signed   By: Anner Crete M.D.   On: 02/15/2017 20:18   Dg Chest Port 1 View  Result Date: 02/15/2017 CLINICAL DATA:  83 year old female with altered mental status. Patient fell on the right side. History of breast cancer and mastectomy. EXAM: PORTABLE CHEST 1 VIEW COMPARISON:  Chest radiograph dated 12/26/2016 FINDINGS: Left subclavian central venous line with tip over central SVC in stable positioning. There is cardiomegaly with interval development of vascular congestion and mild edema. Patchy area of hazy density in the left mid to lower lung fields likely involving the left upper lobe and lingula may be related to  edema or pneumonia. Clinical correlation is recommended. Lymphatic spread of tumor is not entirely excluded in this patient with history of breast cancer. A small left pleural effusion may be present. There is no pneumothorax. No acute osseous pathology identified. Right axillary surgical clips noted. IMPRESSION: Cardiomegaly with interval development of congestive changes and interstitial edema. Superimposed pneumonia involving the left upper lobe/ lingula or lymphangitic spread of tumor are not excluded. Clinical correlation and follow-up is recommended. Electronically Signed   By: Anner Crete M.D.   On: 02/15/2017 20:37    (Echo, Carotid, EGD, Colonoscopy, ERCP)   Subjective: Pt without complaints.   Discharge Exam: Vitals:   02/16/17 2140 02/17/17 0659  BP: 121/66 (!) 124/44  Pulse: 82 75  Resp: 18 18  Temp: 98.3 F (36.8 C) 98 F (36.7 C)   Vitals:   02/16/17  0642 02/16/17 1508 02/16/17 2140 02/17/17 0659  BP: (!) 132/46 (!) 150/53 121/66 (!) 124/44  Pulse: 76 73 82 75  Resp: $Remo'18 18 18 18  'aPVqa$ Temp: 98.2 F (36.8 C) 98 F (36.7 C) 98.3 F (36.8 C) 98 F (36.7 C)  TempSrc: Oral Oral Oral Oral  SpO2: 92% 96% 96% 95%  Weight:      Height:        General: Pt is alert, awake, not in acute distress Cardiovascular: RRR, S1/S2 +, no rubs, no gallops Respiratory: CTA bilaterally, no wheezing, no rhonchi Abdominal: Soft, NT, ND, bowel sounds + Extremities: no edema, no cyanosis    The results of significant diagnostics from this hospitalization (including imaging, microbiology, ancillary and laboratory) are listed below for reference.     Microbiology: Recent Results (from the past 240 hour(s))  MRSA PCR Screening     Status: None   Collection Time: 02/16/17  5:17 AM  Result Value Ref Range Status   MRSA by PCR NEGATIVE NEGATIVE Final    Comment:        The GeneXpert MRSA Assay (FDA approved for NASAL specimens only), is one component of a comprehensive MRSA colonization surveillance program. It is not intended to diagnose MRSA infection nor to guide or monitor treatment for MRSA infections.      Labs: BNP (last 3 results)  Recent Labs  11/21/16 2304  BNP 295.6*   Basic Metabolic Panel:  Recent Labs Lab 02/15/17 2029 02/16/17 0616  NA 136 138  K 3.2* 4.9  CL 101 101  CO2 26 26  GLUCOSE 204* 307*  BUN 87* 80*  CREATININE 2.97* 2.80*  CALCIUM 8.6* 9.0   Liver Function Tests:  Recent Labs Lab 02/15/17 2029  AST 18  ALT 12*  ALKPHOS 33*  BILITOT 0.3  PROT 6.3*  ALBUMIN 2.5*   No results for input(s): LIPASE, AMYLASE in the last 168 hours. No results for input(s): AMMONIA in the last 168 hours. CBC:  Recent Labs Lab 02/15/17 2029 02/16/17 0616  WBC 8.6 8.4  NEUTROABS 7.0  --   HGB 8.6* 8.7*  HCT 26.2* 27.2*  MCV 86.8 87.2  PLT 342 377   Cardiac Enzymes:  Recent Labs Lab 02/15/17 2029   TROPONINI <0.03   BNP: Invalid input(s): POCBNP CBG:  Recent Labs Lab 02/16/17 1131 02/16/17 2139 02/17/17 0356 02/17/17 0850 02/17/17 1204  GLUCAP 309* 229* 217* 220* 266*   D-Dimer No results for input(s): DDIMER in the last 72 hours. Hgb A1c No results for input(s): HGBA1C in the last 72 hours. Lipid Profile No results for input(s): CHOL, HDL, LDLCALC, TRIG, CHOLHDL, LDLDIRECT  in the last 72 hours. Thyroid function studies No results for input(s): TSH, T4TOTAL, T3FREE, THYROIDAB in the last 72 hours.  Invalid input(s): FREET3 Anemia work up No results for input(s): VITAMINB12, FOLATE, FERRITIN, TIBC, IRON, RETICCTPCT in the last 72 hours. Urinalysis    Component Value Date/Time   COLORURINE YELLOW 02/15/2017 2215   APPEARANCEUR CLEAR 02/15/2017 2215   LABSPEC 1.015 02/15/2017 2215   PHURINE 5.5 02/15/2017 2215   GLUCOSEU NEGATIVE 02/15/2017 2215   GLUCOSEU neg 04/07/2010   HGBUR NEGATIVE 02/15/2017 2215   BILIRUBINUR NEGATIVE 02/15/2017 2215   KETONESUR NEGATIVE 02/15/2017 2215   PROTEINUR NEGATIVE 02/15/2017 2215   UROBILINOGEN 0.2 09/27/2014 1630   NITRITE NEGATIVE 02/15/2017 2215   LEUKOCYTESUR NEGATIVE 02/15/2017 2215   Sepsis Labs Invalid input(s): PROCALCITONIN,  WBC,  LACTICIDVEN Microbiology Recent Results (from the past 240 hour(s))  MRSA PCR Screening     Status: None   Collection Time: 02/16/17  5:17 AM  Result Value Ref Range Status   MRSA by PCR NEGATIVE NEGATIVE Final    Comment:        The GeneXpert MRSA Assay (FDA approved for NASAL specimens only), is one component of a comprehensive MRSA colonization surveillance program. It is not intended to diagnose MRSA infection nor to guide or monitor treatment for MRSA infections.    Time coordinating discharge: Over 30 minutes  SIGNED:  Irwin Brakeman, MD  [facility] 02/17/2017, 12:41 PM Pager   If 7PM-7AM, please contact night-coverage [email or web address] Password [email or web address]

## 2017-02-17 NOTE — Progress Notes (Signed)
Pt's VSS and discharge instructions and medications have been reviewed.  Pt given printed information on hypoglycemia, different kinds of insulins, diabetic diet.  Handouts were reviewed w/pt along with her follow-up appointments.  Pt is discharged home w/grandson and his wife.

## 2017-02-18 ENCOUNTER — Encounter (HOSPITAL_COMMUNITY): Payer: Self-pay

## 2017-02-18 ENCOUNTER — Encounter (HOSPITAL_COMMUNITY): Payer: Medicare HMO | Attending: Oncology

## 2017-02-18 VITALS — BP 125/44 | HR 71 | Temp 97.6°F | Resp 18

## 2017-02-18 DIAGNOSIS — E538 Deficiency of other specified B group vitamins: Secondary | ICD-10-CM | POA: Diagnosis not present

## 2017-02-18 LAB — GLUCOSE, CAPILLARY: Glucose-Capillary: 306 mg/dL — ABNORMAL HIGH (ref 65–99)

## 2017-02-18 MED ORDER — CYANOCOBALAMIN 1000 MCG/ML IJ SOLN
1000.0000 ug | Freq: Once | INTRAMUSCULAR | Status: AC
  Administered 2017-02-18: 1000 ug via INTRAMUSCULAR
  Filled 2017-02-18: qty 1

## 2017-02-18 NOTE — Patient Instructions (Signed)
[facility] at [facility] Discharge Instructions  RECOMMENDATIONS MADE BY THE CONSULTANT AND ANY TEST RESULTS WILL BE SENT TO YOUR REFERRING PHYSICIAN.  B12 injection today. Return as scheduled.   Thank you for choosing [facility] at [facility] to provide your oncology and hematology care.  To afford each patient quality time with our provider, please arrive at least 15 minutes before your scheduled appointment time.    If you have a lab appointment with the Cancer Center please come in thru the  Main Entrance and check in at the main information desk  You need to re-schedule your appointment should you arrive 10 or more minutes late.  We strive to give you quality time with our providers, and arriving late affects you and other patients whose appointments are after yours.  Also, if you no show three or more times for appointments you may be dismissed from the clinic at the providers discretion.     Again, thank you for choosing [facility].  Our hope is that these requests will decrease the amount of time that you wait before being seen by our physicians.       _____________________________________________________________  Should you have questions after your visit to  Cancer Center, please contact our office at [phone] between the hours of 8:30 a.m. and 4:30 p.m.  Voicemails left after 4:30 p.m. will not be returned until the following business day.  For prescription refill requests, have your pharmacy contact our office.       Resources For Cancer Patients and their Caregivers ? American Cancer Society: Can assist with transportation, wigs, general needs, runs Look Good Feel Better.        1-888-227-6333 ? Cancer Care: Provides financial assistance, online support groups, medication/co-pay assistance.  1-800-813-HOPE (4673) ? [facility] Assists [place] cancer patients and their  families through emotional , educational and financial support.  336-427-4357 ? [place] DSS Where to apply for food stamps, Medicaid and utility assistance. 336-342-1394 ? [employer]: Transportation to medical appointments. 336-347-2287 ? Social Security Administration: May apply for disability if have a Stage IV cancer. 336-342-7796 1-800-772-1213 ? [place] Aging, Disability and Transit Services: Assists with nutrition, care and transit needs. 336-349-2343  Cancer Center Support Programs: @[email or web address]@ > Cancer Support Group  2nd Tuesday of the month 1pm-2pm, Journey Room  > Creative Journey  3rd Tuesday of the month 1130am-1pm, Journey Room  > Look Good Feel Better  1st Wednesday of the month 10am-12 noon, Journey Room (Call American Cancer Society to register 1-800-395-5775)   

## 2017-02-18 NOTE — Progress Notes (Signed)
[name] presents today for injection per the provider's orders.  B12 administration without incident; see MAR for injection details.  Patient tolerated procedure well and without incident.  No questions or complaints noted at this time.

## 2017-02-26 DIAGNOSIS — N183 Chronic kidney disease, stage 3 (moderate): Secondary | ICD-10-CM | POA: Diagnosis not present

## 2017-02-26 DIAGNOSIS — D631 Anemia in chronic kidney disease: Secondary | ICD-10-CM | POA: Diagnosis not present

## 2017-02-26 DIAGNOSIS — R809 Proteinuria, unspecified: Secondary | ICD-10-CM | POA: Diagnosis not present

## 2017-02-26 DIAGNOSIS — N2581 Secondary hyperparathyroidism of renal origin: Secondary | ICD-10-CM | POA: Diagnosis not present

## 2017-03-08 DIAGNOSIS — R69 Illness, unspecified: Secondary | ICD-10-CM | POA: Diagnosis not present

## 2017-03-12 DIAGNOSIS — G894 Chronic pain syndrome: Secondary | ICD-10-CM | POA: Diagnosis not present

## 2017-03-12 DIAGNOSIS — M810 Age-related osteoporosis without current pathological fracture: Secondary | ICD-10-CM | POA: Diagnosis not present

## 2017-03-12 DIAGNOSIS — E113391 Type 2 diabetes mellitus with moderate nonproliferative diabetic retinopathy without macular edema, right eye: Secondary | ICD-10-CM | POA: Diagnosis not present

## 2017-03-19 DIAGNOSIS — E113591 Type 2 diabetes mellitus with proliferative diabetic retinopathy without macular edema, right eye: Secondary | ICD-10-CM | POA: Diagnosis not present

## 2017-03-19 DIAGNOSIS — E1129 Type 2 diabetes mellitus with other diabetic kidney complication: Secondary | ICD-10-CM | POA: Diagnosis not present

## 2017-03-19 DIAGNOSIS — H35351 Cystoid macular degeneration, right eye: Secondary | ICD-10-CM | POA: Diagnosis not present

## 2017-03-19 DIAGNOSIS — E113392 Type 2 diabetes mellitus with moderate nonproliferative diabetic retinopathy without macular edema, left eye: Secondary | ICD-10-CM | POA: Diagnosis not present

## 2017-03-19 DIAGNOSIS — H43811 Vitreous degeneration, right eye: Secondary | ICD-10-CM | POA: Diagnosis not present

## 2017-03-21 ENCOUNTER — Encounter (HOSPITAL_COMMUNITY): Payer: Medicare HMO | Attending: Oncology

## 2017-03-21 VITALS — BP 135/43 | HR 72 | Temp 98.1°F | Resp 20

## 2017-03-21 DIAGNOSIS — Z95828 Presence of other vascular implants and grafts: Secondary | ICD-10-CM

## 2017-03-21 DIAGNOSIS — G894 Chronic pain syndrome: Secondary | ICD-10-CM | POA: Diagnosis not present

## 2017-03-21 DIAGNOSIS — E538 Deficiency of other specified B group vitamins: Secondary | ICD-10-CM | POA: Insufficient documentation

## 2017-03-21 DIAGNOSIS — Z6838 Body mass index (BMI) 38.0-38.9, adult: Secondary | ICD-10-CM | POA: Diagnosis not present

## 2017-03-21 MED ORDER — CYANOCOBALAMIN 1000 MCG/ML IJ SOLN
1000.0000 ug | Freq: Once | INTRAMUSCULAR | Status: AC
  Administered 2017-03-21: 1000 ug via INTRAMUSCULAR

## 2017-03-21 MED ORDER — CYANOCOBALAMIN 1000 MCG/ML IJ SOLN
INTRAMUSCULAR | Status: AC
  Filled 2017-03-21: qty 1

## 2017-03-21 MED ORDER — SODIUM CHLORIDE 0.9% FLUSH
10.0000 mL | Freq: Once | INTRAVENOUS | Status: AC
  Administered 2017-03-21: 10 mL via INTRAVENOUS

## 2017-03-21 MED ORDER — HEPARIN SOD (PORK) LOCK FLUSH 100 UNIT/ML IV SOLN
500.0000 [IU] | Freq: Once | INTRAVENOUS | Status: AC
  Administered 2017-03-21: 500 [IU] via INTRAVENOUS
  Filled 2017-03-21: qty 5

## 2017-03-21 NOTE — Patient Instructions (Signed)
[facility] at [facility] Discharge Instructions  RECOMMENDATIONS MADE BY THE CONSULTANT AND ANY TEST RESULTS WILL BE SENT TO YOUR REFERRING PHYSICIAN.  Vitamin B12 1000 mcg injection given as ordered. Port flush done today as ordered. Return as scheduled.  Thank you for choosing [facility] at [facility] to provide your oncology and hematology care.  To afford each patient quality time with our provider, please arrive at least 15 minutes before your scheduled appointment time.    If you have a lab appointment with the [facility] please come in thru the  Main Entrance and check in at the main information desk  You need to re-schedule your appointment should you arrive 10 or more minutes late.  We strive to give you quality time with our providers, and arriving late affects you and other patients whose appointments are after yours.  Also, if you no show three or more times for appointments you may be dismissed from the clinic at the providers discretion.     Again, thank you for choosing [facility].  Our hope is that these requests will decrease the amount of time that you wait before being seen by our physicians.       _____________________________________________________________  Should you have questions after your visit to [facility], please contact our office at [phone] between the hours of 8:30 a.m. and 4:30 p.m.  Voicemails left after 4:30 p.m. will not be returned until the following business day.  For prescription refill requests, have your pharmacy contact our office.       Resources For Cancer Patients and their Caregivers ? American Cancer Society: Can assist with transportation, wigs, general needs, runs Look Good Feel Better.        636-581-8071 ? Cancer Care: Provides financial assistance, online support groups, medication/co-pay assistance.  1-800-813-HOPE 339-595-7975) ? [facility] Assists [place] cancer patients and their families through emotional , educational and financial support.  279-589-1786 ? [place] DSS Where to apply for food stamps, Medicaid and utility assistance. 9733334091 ? RCATS: Transportation to medical appointments. 3473383514 ? Social Security Administration: May apply for disability if have a Stage IV cancer. 403-219-4601 650-240-5983 ? [employer], Disability and Transit Services: Assists with nutrition, care and transit needs. [facility] Support Programs: $RemoveBeforeDEI'@[email or web address]'[email or web address]$ @ > Cancer Support Group  2nd Tuesday of the month 1pm-2pm, Journey Room  > Creative Journey  3rd Tuesday of the month 1130am-1pm, Journey Room  > Look Good Feel Better  1st Wednesday of the month 10am-12 noon, Journey Room (Call [facility] to register [phone])

## 2017-03-21 NOTE — Progress Notes (Signed)
[name] presents today for injection per MD orders. B12 1000 mcg administered IM in left Upper Arm. Administration without incident. Patient tolerated well.  [name] presented for Portacath access and flush. Proper placement of portacath confirmed by CXR. Portacath located left chest wall accessed with  H 20 needle. Good blood return present. Portacath flushed with 39ml NS and 500U/55ml Heparin and needle removed intact. Procedure without incident. Patient tolerated procedure well.

## 2017-03-25 ENCOUNTER — Encounter (HOSPITAL_COMMUNITY): Payer: Self-pay

## 2017-04-10 DIAGNOSIS — R69 Illness, unspecified: Secondary | ICD-10-CM | POA: Diagnosis not present

## 2017-04-11 DIAGNOSIS — G894 Chronic pain syndrome: Secondary | ICD-10-CM | POA: Diagnosis not present

## 2017-04-11 DIAGNOSIS — M810 Age-related osteoporosis without current pathological fracture: Secondary | ICD-10-CM | POA: Diagnosis not present

## 2017-04-11 DIAGNOSIS — E113391 Type 2 diabetes mellitus with moderate nonproliferative diabetic retinopathy without macular edema, right eye: Secondary | ICD-10-CM | POA: Diagnosis not present

## 2017-04-18 DIAGNOSIS — Z6838 Body mass index (BMI) 38.0-38.9, adult: Secondary | ICD-10-CM | POA: Diagnosis not present

## 2017-04-18 DIAGNOSIS — M2550 Pain in unspecified joint: Secondary | ICD-10-CM | POA: Diagnosis not present

## 2017-04-18 DIAGNOSIS — G253 Myoclonus: Secondary | ICD-10-CM | POA: Diagnosis not present

## 2017-04-18 DIAGNOSIS — G894 Chronic pain syndrome: Secondary | ICD-10-CM | POA: Diagnosis not present

## 2017-04-22 ENCOUNTER — Ambulatory Visit (HOSPITAL_COMMUNITY): Payer: Self-pay

## 2017-04-25 ENCOUNTER — Encounter (HOSPITAL_COMMUNITY): Payer: Medicare HMO | Attending: Oncology

## 2017-04-25 VITALS — BP 148/43 | HR 59 | Resp 18

## 2017-04-25 DIAGNOSIS — E538 Deficiency of other specified B group vitamins: Secondary | ICD-10-CM | POA: Diagnosis not present

## 2017-04-25 MED ORDER — CYANOCOBALAMIN 1000 MCG/ML IJ SOLN
INTRAMUSCULAR | Status: AC
  Filled 2017-04-25: qty 1

## 2017-04-25 MED ORDER — CYANOCOBALAMIN 1000 MCG/ML IJ SOLN
1000.0000 ug | Freq: Once | INTRAMUSCULAR | Status: AC
Start: 2017-04-25 — End: 2017-04-25
  Administered 2017-04-25: 1000 ug via INTRAMUSCULAR

## 2017-04-25 NOTE — Patient Instructions (Signed)
[facility] at [facility] Discharge Instructions  RECOMMENDATIONS MADE BY THE CONSULTANT AND ANY TEST RESULTS WILL BE SENT TO YOUR REFERRING PHYSICIAN.  Vitamin B12 1000 mcg injection given as ordered.  Thank you for choosing [facility] at [facility] to provide your oncology and hematology care.  To afford each patient quality time with our provider, please arrive at least 15 minutes before your scheduled appointment time.    If you have a lab appointment with the Cancer Center please come in thru the  Main Entrance and check in at the main information desk  You need to re-schedule your appointment should you arrive 10 or more minutes late.  We strive to give you quality time with our providers, and arriving late affects you and other patients whose appointments are after yours.  Also, if you no show three or more times for appointments you may be dismissed from the clinic at the providers discretion.     Again, thank you for choosing [facility].  Our hope is that these requests will decrease the amount of time that you wait before being seen by our physicians.       _____________________________________________________________  Should you have questions after your visit to [facility], please contact our office at [phone] between the hours of 8:30 a.m. and 4:30 p.m.  Voicemails left after 4:30 p.m. will not be returned until the following business day.  For prescription refill requests, have your pharmacy contact our office.       Resources For Cancer Patients and their Caregivers ? American Cancer Society: Can assist with transportation, wigs, general needs, runs Look Good Feel Better.        1-888-227-6333 ? Cancer Care: Provides financial assistance, online support groups, medication/co-pay assistance.  1-800-813-HOPE (4673) ? [facility] Assists [place] cancer patients and  their families through emotional , educational and financial support.  336-427-4357 ? [place] DSS Where to apply for food stamps, Medicaid and utility assistance. 336-342-1394 ? [employer]: Transportation to medical appointments. 336-347-2287 ? Social Security Administration: May apply for disability if have a Stage IV cancer. 336-342-7796 1-800-772-1213 ? [place] Aging, Disability and Transit Services: Assists with nutrition, care and transit needs. 336-349-2343  Cancer Center Support Programs: @[email or web address]@ > Cancer Support Group  2nd Tuesday of the month 1pm-2pm, Journey Room  > Creative Journey  3rd Tuesday of the month 1130am-1pm, Journey Room  > Look Good Feel Better  1st Wednesday of the month 10am-12 noon, Journey Room (Call American Cancer [employer] to register 1-800-395-5775)   

## 2017-04-25 NOTE — Progress Notes (Signed)
[name] presents today for injection per MD orders. B12 1000 mcg administered IM in left Upper Arm. Administration without incident. Patient tolerated well. Patient requested port flush appointment be scheduled with b12 injection due to transportation issues. Stable on discharge home via wheelchair.

## 2017-05-12 DIAGNOSIS — E113391 Type 2 diabetes mellitus with moderate nonproliferative diabetic retinopathy without macular edema, right eye: Secondary | ICD-10-CM | POA: Diagnosis not present

## 2017-05-12 DIAGNOSIS — G894 Chronic pain syndrome: Secondary | ICD-10-CM | POA: Diagnosis not present

## 2017-05-12 DIAGNOSIS — M810 Age-related osteoporosis without current pathological fracture: Secondary | ICD-10-CM | POA: Diagnosis not present

## 2017-05-16 ENCOUNTER — Encounter (HOSPITAL_COMMUNITY): Payer: Self-pay

## 2017-05-20 ENCOUNTER — Encounter: Payer: Self-pay | Admitting: Neurology

## 2017-05-20 DIAGNOSIS — R69 Illness, unspecified: Secondary | ICD-10-CM | POA: Diagnosis not present

## 2017-05-20 DIAGNOSIS — R269 Unspecified abnormalities of gait and mobility: Secondary | ICD-10-CM | POA: Diagnosis not present

## 2017-05-20 DIAGNOSIS — R011 Cardiac murmur, unspecified: Secondary | ICD-10-CM | POA: Diagnosis not present

## 2017-05-20 DIAGNOSIS — M797 Fibromyalgia: Secondary | ICD-10-CM | POA: Diagnosis not present

## 2017-05-20 DIAGNOSIS — M810 Age-related osteoporosis without current pathological fracture: Secondary | ICD-10-CM | POA: Diagnosis not present

## 2017-05-20 DIAGNOSIS — D519 Vitamin B12 deficiency anemia, unspecified: Secondary | ICD-10-CM | POA: Diagnosis not present

## 2017-05-20 DIAGNOSIS — I482 Chronic atrial fibrillation: Secondary | ICD-10-CM | POA: Diagnosis not present

## 2017-05-20 DIAGNOSIS — R29898 Other symptoms and signs involving the musculoskeletal system: Secondary | ICD-10-CM | POA: Diagnosis not present

## 2017-05-20 DIAGNOSIS — N189 Chronic kidney disease, unspecified: Secondary | ICD-10-CM | POA: Diagnosis not present

## 2017-05-20 DIAGNOSIS — Z791 Long term (current) use of non-steroidal anti-inflammatories (NSAID): Secondary | ICD-10-CM | POA: Diagnosis not present

## 2017-05-20 DIAGNOSIS — E785 Hyperlipidemia, unspecified: Secondary | ICD-10-CM | POA: Diagnosis not present

## 2017-05-20 DIAGNOSIS — E1122 Type 2 diabetes mellitus with diabetic chronic kidney disease: Secondary | ICD-10-CM | POA: Diagnosis not present

## 2017-05-20 DIAGNOSIS — Z794 Long term (current) use of insulin: Secondary | ICD-10-CM | POA: Diagnosis not present

## 2017-05-20 DIAGNOSIS — R001 Bradycardia, unspecified: Secondary | ICD-10-CM | POA: Diagnosis not present

## 2017-05-20 DIAGNOSIS — J309 Allergic rhinitis, unspecified: Secondary | ICD-10-CM | POA: Diagnosis not present

## 2017-05-20 DIAGNOSIS — K08409 Partial loss of teeth, unspecified cause, unspecified class: Secondary | ICD-10-CM | POA: Diagnosis not present

## 2017-05-20 DIAGNOSIS — K089 Disorder of teeth and supporting structures, unspecified: Secondary | ICD-10-CM | POA: Diagnosis not present

## 2017-05-20 DIAGNOSIS — Z0000 Encounter for general adult medical examination without abnormal findings: Secondary | ICD-10-CM | POA: Diagnosis not present

## 2017-05-20 DIAGNOSIS — J449 Chronic obstructive pulmonary disease, unspecified: Secondary | ICD-10-CM | POA: Diagnosis not present

## 2017-05-21 ENCOUNTER — Encounter: Payer: Self-pay | Admitting: Neurology

## 2017-05-21 ENCOUNTER — Ambulatory Visit (INDEPENDENT_AMBULATORY_CARE_PROVIDER_SITE_OTHER): Payer: Medicare HMO | Admitting: Neurology

## 2017-05-21 VITALS — BP 152/68 | HR 50

## 2017-05-21 DIAGNOSIS — G253 Myoclonus: Secondary | ICD-10-CM | POA: Diagnosis not present

## 2017-05-21 NOTE — Patient Instructions (Signed)
We will investigate things further with blood work today and call you with the test results. We will do an EEG (brainwave test), which we will schedule. We will call you with the results. 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 This can be seen in the context of certain medications including narcotic pain medication and kidney disease.  There may not be much I can do for these intermittent jerking.  If your test results are benign, I may see you back as needed.

## 2017-05-21 NOTE — Progress Notes (Signed)
Subjective:    Patient ID: [name] is a 83 y.o. female.  HPI     Star Age, MD, PhD [facility] [address], [address] [place], [place] [address]  Dear Dr. Gerarda Fraction,   I saw your patient, Brittni Hult, upon your kind request in my neurologic clinic today for initial consultation of her involuntary movements, concern for myoclonus. The patient is unaccompanied today. As you know, Ms. Ayesha Rumpf is a 83 year old right-handed woman with an underlying medical history of type 2 diabetes, chronic kidney disease, hypertension, history of A. fib, recent admission to the hospital for hypoglycemia hyperlipidemia, irritable bowel syndrome, obesity, diabetic retinopathy, depression, vitamin B12 deficiency, breast cancer, and chronic back pain, on narcotic pain medication as well as Ultram prn, who reports intermittent jerking in her UEs for the past year. I reviewed your office note from 04/18/2017, which you kindly included. She is status post back surgeries twice, had right mastectomy, TE as a child, b/l cataract extractions, hysterectomy and cholecystectomy. She lives alone, has 3 grown children, is widowed, gets meals on wheels, has 2 sons (older son with ALS), daughter died at age 37 from lung disease.    She had a head CT without contrast on 02/15/2017 which I reviewed: IMPRESSION: No acute intracranial hemorrhage.   Age-related atrophy and chronic microvascular ischemic disease.   If symptoms persist and there are no contraindications, MRI may provide better evaluation if clinically indicated. She quit smoking in 1982. She drinks alcohol occasionally. She drinks caffeine in the form of coffee, 1-2 cups daily. She is retired. She is in a WC for the most part, no longer drives x 2 years. She has not fallen recently. The muscle jerking affects mostly her upper body when she tries to do something such as feed herself or right something. She does not have any twitching  or jerking in her sleep. She has not had any abnormal movements today. Most recent A1c around 10 per patient.   Her Past Medical History Is Significant For: Past Medical History:  Diagnosis Date  . Anemia   . Aortic stenosis    Mild  . Arthritis   . Asthma   . Atrial flutter ([place]) 2002  . B12 deficiency 06/20/2015  . B12 deficiency 06/20/2015  . Breast carcinoma ([place])   . Cerebrovascular disease    with a 78% LICA; repeat study in 10/2009-no obstructive disease; modest ASVD  . Chronic kidney disease    Creatinine-1.5 in 2010 and 2.5-3 in 2011; 1.5-10/2010;  Klebsiella UTI-10/2010. urine protein 36 mg/dl, mildly elevated  . Decreased bone density   . Depression   . Diabetes mellitus   . Diabetic retinopathy   . DJD (degenerative joint disease) 11/14/2011  . Falls infrequently 01/2010   fracture of pelvis and left humerus  . Fibromyalgia   . Headaches, cluster   . Hyperlipidemia   . Hypertension   . IBS (irritable bowel syndrome)    diverticulosis, gastroesophageal reflux disease  . Lower back pain   . Obesity 11/14/2011  . Spinal stenosis     Her Past Surgical History Is Significant For: Past Surgical History:  Procedure Laterality Date  . APPENDECTOMY    . CATARACT EXTRACTION, BILATERAL     Lens implants  . CENTRAL VENOUS CATHETER TUNNELED INSERTION SINGLE LUMEN    . CHOLECYSTECTOMY    . COLONOSCOPY  2012  . EYE SURGERY  nov 2012   for diabetic retinopathy  . LUMBAR DISC SURGERY  lumbosacral spine procedure x 2  . MASTECTOMY     Right breast for carcinoma    Her Family History Is Significant For: Family History  Problem Relation Age of Onset  . Alzheimer's disease Mother   . Heart attack Father   . Aneurysm Sister   . Coronary artery disease Brother     Her Social History Is Significant For: Social History   Social History  . Marital status: Widowed    Spouse name: N/A  . Number of children: 2  . Years of education: N/A   Occupational History   . On disability Retired   Social History Main Topics  . Smoking status: Former Smoker    Packs/day: 1.00    Years: 30.00    Types: Cigarettes    Quit date: 69  . Smokeless tobacco: Never Used  . Alcohol use No  . Drug use: No  . Sexual activity: Not Asked   Other Topics Concern  . None   Social History Narrative   Lives alone    Her Allergies Are:  Allergies  Allergen Reactions  . Mold Extract [Trichophyton Mentagrophyte] Other (See Comments)    Reaction:  Itchy, watery eyes/sneezing   . Morphine And Related Other (See Comments)    Reaction:  Drowsiness. "I didn't wake up for almost 1 week" after taking morphine.   . Niaspan [Niacin] Other (See Comments)    Reaction:  Flushing   . Pollen Extract Other (See Comments)    Reaction:  Itchy, watery eyes/sneezing   :   Her Current Medications Are:  Outpatient Encounter Prescriptions as of 05/21/2017  Medication Sig  . albuterol (PROAIR HFA) 108 (90 BASE) MCG/ACT inhaler Inhale 2 puffs into the lungs every 6 (six) hours as needed for wheezing or shortness of breath.  Marland Kitchen aspirin EC 81 MG tablet Take 81 mg by mouth daily.  . cyanocobalamin (,VITAMIN B-12,) 1000 MCG/ML injection Inject 1,000 mcg into the muscle every 30 (thirty) days.  . Diclofenac Sodium 3 % GEL Apply 1 application topically 3 (three) times daily.  Marland Kitchen esomeprazole (NEXIUM) 40 MG capsule Take 40 mg by mouth daily.   . fenofibrate (TRICOR) 145 MG tablet Take 145 mg by mouth daily.  . fluticasone (FLONASE) 50 MCG/ACT nasal spray Place 1 spray into both nostrils daily.  . furosemide (LASIX) 40 MG tablet Take 1 tablet (40 mg total) by mouth daily.  Marland Kitchen HYDROcodone-acetaminophen (NORCO) 10-325 MG per tablet Take 1 tablet by mouth 2 (two) times daily as needed for moderate pain.   Marland Kitchen insulin aspart (NOVOLOG FLEXPEN) 100 UNIT/ML FlexPen Inject 8 Units into the skin 3 (three) times daily with meals.  . Insulin Degludec (TRESIBA FLEXTOUCH) 200 UNIT/ML SOPN Inject 12 Units  into the skin at bedtime.  Marland Kitchen lisinopril (PRINIVIL,ZESTRIL) 20 MG tablet Take 20 mg by mouth daily.  . metoprolol tartrate (LOPRESSOR) 50 MG tablet Take 50 mg by mouth daily.  Marland Kitchen PARoxetine (PAXIL) 40 MG tablet Take 40 mg by mouth daily.   . pregabalin (LYRICA) 50 MG capsule Take 50 mg by mouth 3 (three) times daily.   . raloxifene (EVISTA) 60 MG tablet Take 60 mg by mouth daily.  . rosuvastatin (CRESTOR) 40 MG tablet Take 1 tablet (40 mg total) by mouth at bedtime.  . traMADol (ULTRAM) 50 MG tablet Take 50-100 mg by mouth 4 (four) times daily as needed.   Facility-Administered Encounter Medications as of 05/21/2017  Medication  . sodium chloride 0.9 % injection 10 mL  :  Review of Systems:  Out of a complete 14 point review of systems, all are reviewed and negative with the exception of these symptoms as listed below: Review of Systems  Neurological:       Pt presents today to discuss arm jerking that happens every other day. When it happens, she will drop or throw whatever she is holding. Pt is taking norco once in the morning and again later in the day if needed. Pt also takes tramadol when she has tablets available.    Objective:  Neurologic Exam  Physical Exam Physical Examination:   Vitals:   05/21/17 1434  BP: (!) 152/68  Pulse: (!) 50    General Examination: The patient is a very pleasant 83 y.o. female in no acute distress. She appears deconditioned. In WC.   HEENT: Normocephalic, atraumatic, pupils are equal, round and reactive to light and accommodation. S/p cataract repairs. Extraocular tracking is good without limitation to gaze excursion or nystagmus noted. Normal smooth pursuit is noted. Hearing is grossly intact. Face is symmetric with normal facial animation and normal facial sensation. Speech is clear with no dysarthria noted. There is no hypophonia. There is a mild lower lip and chin tremor. No voice tremor. Neck is supple with full range of passive and active  motion. Oropharynx exam reveals: moderate mouth dryness, adequate dental hygiene. Tongue protrudes centrally and palate elevates symmetrically. No orofacial dyskinesias, no motor impersistence.  Chest: Clear to auscultation without wheezing, rhonchi or crackles noted. Status post right mastectomy.  Heart: S1+S2+0, regular and normal without murmurs, rubs or gallops noted.   Abdomen: Soft, non-tender and non-distended with normal bowel sounds appreciated on auscultation.  Extremities: There is 2+ pitting edema in the distal lower extremities bilaterally. Chronic stasis-like changes in her distal lower extremities, her extremities are pale, skin very thickened.  Skin: Cooler and pale appearing legs, significant swelling distally, thicker skin, leathery feel. No significant varicose veins.  Musculoskeletal: exam reveals no obvious joint deformities, tenderness or joint swelling or erythema.   Neurologically:  Mental status: The patient is awake, alert and oriented in all 4 spheres. Her immediate and remote memory, attention, language skills and fund of knowledge are appropriate. There is no evidence of aphasia, agnosia, apraxia or anomia. Speech is clear with normal prosody and enunciation. Thought process is linear. Mood is normal and affect is normal.  Cranial nerves II - XII are as described above under HEENT exam. In addition: shoulder shrug is normal with equal shoulder height noted. Motor exam: Normal bulk, strength and tone is noted. There is no drift, tremor or rebound. Romberg is negative. Reflexes are 1+ in the upper extremities, absent in the lower extremities. Fine motor skills are globally mildly impaired. She has no resting tremor, no postural or action tremor. No myoclonic jerking is seen. No athetosis or dyskinesias are seen.  Cerebellar testing: No dysmetria or intention tremor in the upper extremities, heel-to-shin is not possible. She cannot stand or walk for me. She complains of  right-sided low back pain. Sensory exam is intact in the upper extremities but decrease to all modalities including pinprick, temperature and even light touch up to the knees bilaterally.   Assessment and Plan:   In summary, MERRY POND is a very pleasant 83 y.o.-year old female with an underlying medical history of type 2 diabetes, chronic kidney disease, hypertension, history of A. fib, recent admission to the hospital for hypoglycemia hyperlipidemia, irritable bowel syndrome, obesity, diabetic retinopathy, depression, vitamin B12 deficiency, breast  cancer, and chronic back pain, on narcotic pain medication as well as Ultram prn, who reports intermittent jerking in her UEs for the past year.  her history is suggestive of myoclonic jerking, on examination she did not have any myoclonus or tremor with the exception of a very mild lower lip and jaw tremor which does not seem to bother her. Myoclonic jerking can be seen in the context of certain medications I explained to her, in particular narcotic pain medication. In addition, myoclonus can be seen in patients with chronic kidney disease. Often times, additional medication to try to subdue myoclonus can cause side effects and is not always successful either. I suggested we investigate things further with some blood test today including TSH, ANA, and we will call her with her test results. In addition, I suggested we do an EEG, rarely myoclonus is epileptiform in nature. She does not have any history suggestive of epilepsy. She is advised to stay well-hydrated, work on good blood sugar control and overall good health. If her test results are benign I will see her back on an as-needed basis. I answered all her questions today and she was in agreement.   Thank you very much for allowing me to participate in the care of this nice patient. If I can be of any further assistance to you please do not hesitate to call me at [phone].  Sincerely,   Star Age, MD, PhD

## 2017-05-22 LAB — RPR: RPR Ser Ql: NONREACTIVE

## 2017-05-22 LAB — ANA W/REFLEX: Anti Nuclear Antibody(ANA): NEGATIVE

## 2017-05-22 LAB — CK: Total CK: 69 U/L (ref 24–173)

## 2017-05-22 LAB — TSH: TSH: 2.3 u[IU]/mL (ref 0.450–4.500)

## 2017-05-22 NOTE — Progress Notes (Signed)
Labs okay, please advise patient.  Star Age, MD, PhD [facility])

## 2017-05-23 ENCOUNTER — Encounter (HOSPITAL_COMMUNITY): Payer: Medicare HMO

## 2017-05-23 ENCOUNTER — Encounter (HOSPITAL_COMMUNITY): Payer: Medicare HMO | Attending: Oncology

## 2017-05-23 ENCOUNTER — Telehealth: Payer: Self-pay | Admitting: *Deleted

## 2017-05-23 ENCOUNTER — Encounter (HOSPITAL_COMMUNITY): Payer: Self-pay

## 2017-05-23 VITALS — BP 143/40 | HR 66 | Temp 98.0°F | Resp 17 | Wt 243.6 lb

## 2017-05-23 DIAGNOSIS — E538 Deficiency of other specified B group vitamins: Secondary | ICD-10-CM | POA: Diagnosis not present

## 2017-05-23 DIAGNOSIS — Z95828 Presence of other vascular implants and grafts: Secondary | ICD-10-CM

## 2017-05-23 MED ORDER — SODIUM CHLORIDE 0.9% FLUSH
10.0000 mL | INTRAVENOUS | Status: DC | PRN
  Administered 2017-05-23: 10 mL via INTRAVENOUS
  Filled 2017-05-23: qty 10

## 2017-05-23 MED ORDER — HEPARIN SOD (PORK) LOCK FLUSH 100 UNIT/ML IV SOLN
500.0000 [IU] | Freq: Once | INTRAVENOUS | Status: AC
  Administered 2017-05-23: 500 [IU] via INTRAVENOUS

## 2017-05-23 MED ORDER — CYANOCOBALAMIN 1000 MCG/ML IJ SOLN
1000.0000 ug | Freq: Once | INTRAMUSCULAR | Status: AC
  Administered 2017-05-23: 1000 ug via INTRAMUSCULAR
  Filled 2017-05-23: qty 1

## 2017-05-23 NOTE — Progress Notes (Signed)
[name] presented for Portacath access and flush. Portacath located left chest wall accessed with  H 20 needle. Good blood return present. Portacath flushed with 28ml NS and 500U/40ml Heparin and needle removed intact. Procedure without incident. Patient tolerated procedure well.  [name] presents today for injection per MD orders. B12 1,090mcg administered IM  in left Upper Arm. Administration without incident. Patient tolerated well.  Vitals stable and discharged home from clinic via wheelchair. Follow up as scheduled.

## 2017-05-23 NOTE — Patient Instructions (Signed)
[place] at [facility] Discharge Instructions  RECOMMENDATIONS MADE BY THE CONSULTANT AND ANY TEST RESULTS WILL BE SENT TO YOUR REFERRING PHYSICIAN.  Port flush done, B12 injection done Follow up as scheduled.  Thank you for choosing [place] at [facility] to provide your oncology and hematology care.  To afford each patient quality time with our provider, please arrive at least 15 minutes before your scheduled appointment time.    If you have a lab appointment with the [facility] please come in thru the  Main Entrance and check in at the main information desk  You need to re-schedule your appointment should you arrive 10 or more minutes late.  We strive to give you quality time with our providers, and arriving late affects you and other patients whose appointments are after yours.  Also, if you no show three or more times for appointments you may be dismissed from the clinic at the providers discretion.     Again, thank you for choosing [facility].  Our hope is that these requests will decrease the amount of time that you wait before being seen by our physicians.       _____________________________________________________________  Should you have questions after your visit to [facility], please contact our office at [phone] between the hours of 8:30 a.m. and 4:30 p.m.  Voicemails left after 4:30 p.m. will not be returned until the following business day.  For prescription refill requests, have your pharmacy contact our office.       Resources For Cancer Patients and their Caregivers ? American Cancer Society: Can assist with transportation, wigs, general needs, runs Look Good Feel Better.        204-681-3752 ? Cancer Care: Provides financial assistance, online support groups, medication/co-pay assistance.  1-800-813-HOPE (445)139-5712) ? [place] Assists [place] cancer  patients and their families through emotional , educational and financial support.  315-881-6496 ? [place] DSS Where to apply for food stamps, Medicaid and utility assistance. (769)525-3745 ? [facility]: Transportation to medical appointments. (347)228-3773 ? Social Security Administration: May apply for disability if have a Stage IV cancer. 725-774-0805 306-874-2723 ? [employer], Disability and Transit Services: Assists with nutrition, care and transit needs. [place] Support Programs: $RemoveBeforeDEI'@10RELATIVEDAYS'WMtXCWVCBwBnZPTS$ @ > Cancer Support Group  2nd Tuesday of the month 1pm-2pm, Journey Room  > Creative Journey  3rd Tuesday of the month 1130am-1pm, Journey Room  > Look Good Feel Better  1st Wednesday of the month 10am-12 noon, Journey Room (Call [place] to register [phone])

## 2017-05-23 NOTE — Telephone Encounter (Signed)
LVM informing patient her labs are fine. Repeated this message and left number for any questions. Gave office hours for tomorrow.

## 2017-06-11 DIAGNOSIS — M810 Age-related osteoporosis without current pathological fracture: Secondary | ICD-10-CM | POA: Diagnosis not present

## 2017-06-11 DIAGNOSIS — G894 Chronic pain syndrome: Secondary | ICD-10-CM | POA: Diagnosis not present

## 2017-06-11 DIAGNOSIS — E113391 Type 2 diabetes mellitus with moderate nonproliferative diabetic retinopathy without macular edema, right eye: Secondary | ICD-10-CM | POA: Diagnosis not present

## 2017-06-20 ENCOUNTER — Ambulatory Visit (INDEPENDENT_AMBULATORY_CARE_PROVIDER_SITE_OTHER): Payer: Medicare HMO | Admitting: Diagnostic Neuroimaging

## 2017-06-20 DIAGNOSIS — R259 Unspecified abnormal involuntary movements: Secondary | ICD-10-CM | POA: Diagnosis not present

## 2017-06-20 DIAGNOSIS — G253 Myoclonus: Secondary | ICD-10-CM

## 2017-06-24 ENCOUNTER — Ambulatory Visit (HOSPITAL_COMMUNITY): Payer: Self-pay

## 2017-06-25 ENCOUNTER — Ambulatory Visit (HOSPITAL_COMMUNITY): Payer: Self-pay

## 2017-06-26 NOTE — Procedures (Signed)
   [facility]  EEG (ELECTROENCEPHALOGRAM) REPORT   STUDY DATE: 06/20/17 PATIENT NAME: [name] DOB: Sep 22, 1933 MRN: [id number]  ORDERING CLINICIAN: Star Age, MD PhD   TECHNOLOGIST: Milana Na TECHNIQUE: Electroencephalogram was recorded utilizing standard 10-20 system of lead placement and reformatted into average and bipolar montages.  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  FINDINGS: Background rhythms of 7-8 hertz and 30-40 microvolts. Intermittent muscle artifact noted without epileptiform correlate. No focal, lateralizing, epileptiform activity or seizures are seen. Patient recorded in the awake and drowsy state. EKG channel shows regular rhythm of 50-60 beats per minute.   IMPRESSION:  Mild diffuse slowing (7-8 hertz). Intermittent muscle artifact noted without epileptiform correlate. No focal, lateralizing, epileptiform activity or seizures are seen.    INTERPRETING PHYSICIAN:  Penni Bombard, MD Certified in Neurology, Neurophysiology and Neuroimaging  [facility] [address] [place], [place] [address] [phone]

## 2017-06-27 ENCOUNTER — Telehealth: Payer: Self-pay

## 2017-06-27 NOTE — Progress Notes (Signed)
Please call and advise the patient that the EEG or brain wave test we performed was reported as mildly abnormal, showing some slowness in her brain wave activity but this is a nonspecific finding, no seizure-like activity was seen, no explanation for her muscle jerks. The slowness in the brain wave activity was mild not localized to one area or one side, and could be secondary to age and also in conjunction with certain sedating medications. No further action is required on this test at this time. I can see her back on an as-needed basis at this point. Thanks,  Star Age, MD, PhD

## 2017-06-27 NOTE — Telephone Encounter (Signed)
I called pt to discuss her EEG results. No answer, left a message asking her to call me back.

## 2017-06-27 NOTE — Telephone Encounter (Signed)
-----   Message from Star Age, MD sent at 06/27/2017  5:02 PM EDT ----- Please call and advise the patient that the EEG or brain wave test we performed was reported as mildly abnormal, showing some slowness in her brain wave activity but this is a nonspecific finding, no seizure-like activity was seen, no explanation for her muscle jerks. The slowness in the brain wave activity was mild not localized to one area or one side, and could be secondary to age and also in conjunction with certain sedating medications. No further action is required on this test at this time. I can see her back on an as-needed basis at this point. Thanks,  Star Age, MD, PhD

## 2017-07-01 NOTE — Telephone Encounter (Signed)
Pt returned Rn's call °

## 2017-07-01 NOTE — Telephone Encounter (Signed)
I called pt again to discuss her EEG results. No answer, left a message asking her to call me back.

## 2017-07-01 NOTE — Telephone Encounter (Signed)
I returned pt's call, no answer, left a message asking her to call me back.

## 2017-07-01 NOTE — Telephone Encounter (Signed)
Pt returned my call. I advised her that her EEG was mildly abnormal, showed some slowness in her brain wave activity. This was a non-specific finding, no seizure like activity was seen and no explanation for her muscle jerks. The slowness in her brain wave actiivty was mild, not localized, and could be secondary to age and in conjunction with certain sedating medications. No further action was required on this test at this time. Dr. Rexene Alberts can see her on an as-needed basis. Pt says that her muscle jerking is still happening and is very bothersome and is asking for another appt with Dr. Rexene Alberts. I explained that there might not be anything that Dr. Rexene Alberts can do for these muscle jerks, as Dr. Rexene Alberts told pt at the last appt. Pt is still insistent that she would like an appt with Dr. Rexene Alberts and an appt was made for 09/26/17 at 2:00pm. Pt verbalized understanding of results.

## 2017-07-11 DIAGNOSIS — R69 Illness, unspecified: Secondary | ICD-10-CM | POA: Diagnosis not present

## 2017-07-22 ENCOUNTER — Ambulatory Visit: Payer: Medicare HMO | Admitting: Orthotics

## 2017-07-22 DIAGNOSIS — E139 Other specified diabetes mellitus without complications: Secondary | ICD-10-CM

## 2017-07-22 NOTE — Progress Notes (Signed)
Patient came in today for DBS consult.  Patient is new and she was rescheduled to see DPM first.

## 2017-07-26 ENCOUNTER — Ambulatory Visit (HOSPITAL_COMMUNITY): Payer: Self-pay

## 2017-07-29 DIAGNOSIS — G894 Chronic pain syndrome: Secondary | ICD-10-CM | POA: Diagnosis not present

## 2017-07-29 DIAGNOSIS — R32 Unspecified urinary incontinence: Secondary | ICD-10-CM | POA: Diagnosis not present

## 2017-07-29 DIAGNOSIS — E1165 Type 2 diabetes mellitus with hyperglycemia: Secondary | ICD-10-CM | POA: Diagnosis not present

## 2017-07-29 DIAGNOSIS — Z681 Body mass index (BMI) 19 or less, adult: Secondary | ICD-10-CM | POA: Diagnosis not present

## 2017-08-07 ENCOUNTER — Encounter (HOSPITAL_COMMUNITY): Payer: Medicare HMO | Attending: Oncology

## 2017-08-07 ENCOUNTER — Encounter (HOSPITAL_COMMUNITY): Payer: Self-pay

## 2017-08-07 VITALS — BP 141/77 | HR 71 | Temp 98.1°F | Resp 16

## 2017-08-07 DIAGNOSIS — E538 Deficiency of other specified B group vitamins: Secondary | ICD-10-CM

## 2017-08-07 DIAGNOSIS — Z95828 Presence of other vascular implants and grafts: Secondary | ICD-10-CM

## 2017-08-07 MED ORDER — HEPARIN SOD (PORK) LOCK FLUSH 100 UNIT/ML IV SOLN
500.0000 [IU] | Freq: Once | INTRAVENOUS | Status: AC
  Administered 2017-08-07: 500 [IU] via INTRAVENOUS
  Filled 2017-08-07: qty 5

## 2017-08-07 MED ORDER — CYANOCOBALAMIN 1000 MCG/ML IJ SOLN
1000.0000 ug | Freq: Once | INTRAMUSCULAR | Status: AC
  Administered 2017-08-07: 1000 ug via INTRAMUSCULAR

## 2017-08-07 MED ORDER — CYANOCOBALAMIN 1000 MCG/ML IJ SOLN
INTRAMUSCULAR | Status: AC
  Filled 2017-08-07: qty 1

## 2017-08-07 MED ORDER — SODIUM CHLORIDE 0.9% FLUSH
10.0000 mL | Freq: Once | INTRAVENOUS | Status: AC
  Administered 2017-08-07: 10 mL via INTRAVENOUS

## 2017-08-07 NOTE — Patient Instructions (Signed)
[place] at [facility]  Discharge Instructions:  Port flush and vitamin b12 shot today.   _______________________________________________________________  Thank you for choosing [place] at [facility] to provide your oncology and hematology care.  To afford each patient quality time with our providers, please arrive at least 15 minutes before your scheduled appointment.  You need to re-schedule your appointment if you arrive 10 or more minutes late.  We strive to give you quality time with our providers, and arriving late affects you and other patients whose appointments are after yours.  Also, if you no show three or more times for appointments you may be dismissed from the clinic.  Again, thank you for choosing [place] at [place] hope is that these requests will allow you access to exceptional care and in a timely manner. _______________________________________________________________  If you have questions after your visit, please contact our office at [phone] between the hours of 8:30 a.m. and 5:00 p.m. Voicemails left after 4:30 p.m. will not be returned until the following business day. _______________________________________________________________  For prescription refill requests, have your pharmacy contact our office. _______________________________________________________________  Recommendations made by the consultant and any test results will be sent to your referring physician. _______________________________________________________________

## 2017-08-07 NOTE — Progress Notes (Signed)
Port flushed per protocol.  No complaints with flush.  Port site clean and dry with no bruising or swelling noted at site.  Band aid applied.  Vitamin b12 shot given with no complaints.  Band aid applied.  VSS with discharge and left via wheelchair with family.

## 2017-09-06 ENCOUNTER — Ambulatory Visit (HOSPITAL_COMMUNITY): Payer: Self-pay

## 2017-09-09 ENCOUNTER — Encounter (HOSPITAL_COMMUNITY): Payer: Medicare HMO | Attending: Oncology

## 2017-09-09 ENCOUNTER — Ambulatory Visit (HOSPITAL_COMMUNITY)
Admission: RE | Admit: 2017-09-09 | Discharge: 2017-09-09 | Disposition: A | Discharge status: Home / Self Care | Payer: Medicare HMO | Source: Ambulatory Visit | Attending: Oncology | Admitting: Oncology

## 2017-09-09 ENCOUNTER — Encounter (HOSPITAL_COMMUNITY): Payer: Self-pay

## 2017-09-09 VITALS — BP 135/60 | HR 50 | Temp 98.0°F | Resp 16

## 2017-09-09 DIAGNOSIS — Z1231 Encounter for screening mammogram for malignant neoplasm of breast: Secondary | ICD-10-CM | POA: Diagnosis not present

## 2017-09-09 DIAGNOSIS — Z23 Encounter for immunization: Secondary | ICD-10-CM

## 2017-09-09 DIAGNOSIS — E538 Deficiency of other specified B group vitamins: Secondary | ICD-10-CM | POA: Diagnosis not present

## 2017-09-09 DIAGNOSIS — C50911 Malignant neoplasm of unspecified site of right female breast: Secondary | ICD-10-CM

## 2017-09-09 MED ORDER — CYANOCOBALAMIN 1000 MCG/ML IJ SOLN
1000.0000 ug | Freq: Once | INTRAMUSCULAR | Status: AC
  Administered 2017-09-09: 1000 ug via INTRAMUSCULAR
  Filled 2017-09-09: qty 1

## 2017-09-09 MED ORDER — INFLUENZA VAC SPLIT QUAD 0.5 ML IM SUSY
PREFILLED_SYRINGE | INTRAMUSCULAR | Status: AC
  Filled 2017-09-09: qty 0.5

## 2017-09-09 MED ORDER — INFLUENZA VAC SPLIT QUAD 0.5 ML IM SUSY
0.5000 mL | PREFILLED_SYRINGE | Freq: Once | INTRAMUSCULAR | Status: AC
  Administered 2017-09-09: 0.5 mL via INTRAMUSCULAR

## 2017-09-09 NOTE — Progress Notes (Signed)
[name] presents today for injection per the provider's orders.  B12 and flu vaccine administrations without incident; see MAR for injection details.  Patient tolerated procedure well and without incident.  No questions or complaints noted at this time.  Discharged via wheelchair in c/o family.

## 2017-09-18 ENCOUNTER — Ambulatory Visit: Payer: Self-pay | Admitting: Podiatry

## 2017-09-26 ENCOUNTER — Ambulatory Visit: Payer: Self-pay | Admitting: Neurology

## 2017-10-03 DIAGNOSIS — R809 Proteinuria, unspecified: Secondary | ICD-10-CM | POA: Diagnosis not present

## 2017-10-03 DIAGNOSIS — D631 Anemia in chronic kidney disease: Secondary | ICD-10-CM | POA: Diagnosis not present

## 2017-10-03 DIAGNOSIS — N2581 Secondary hyperparathyroidism of renal origin: Secondary | ICD-10-CM | POA: Diagnosis not present

## 2017-10-03 DIAGNOSIS — E875 Hyperkalemia: Secondary | ICD-10-CM | POA: Diagnosis not present

## 2017-10-03 DIAGNOSIS — M1990 Unspecified osteoarthritis, unspecified site: Secondary | ICD-10-CM | POA: Diagnosis not present

## 2017-10-03 DIAGNOSIS — N189 Chronic kidney disease, unspecified: Secondary | ICD-10-CM | POA: Diagnosis not present

## 2017-10-03 DIAGNOSIS — E1129 Type 2 diabetes mellitus with other diabetic kidney complication: Secondary | ICD-10-CM | POA: Diagnosis not present

## 2017-10-03 DIAGNOSIS — N390 Urinary tract infection, site not specified: Secondary | ICD-10-CM | POA: Diagnosis not present

## 2017-10-03 DIAGNOSIS — I129 Hypertensive chronic kidney disease with stage 1 through stage 4 chronic kidney disease, or unspecified chronic kidney disease: Secondary | ICD-10-CM | POA: Diagnosis not present

## 2017-10-03 DIAGNOSIS — N184 Chronic kidney disease, stage 4 (severe): Secondary | ICD-10-CM | POA: Diagnosis not present

## 2017-10-03 DIAGNOSIS — E669 Obesity, unspecified: Secondary | ICD-10-CM | POA: Diagnosis not present

## 2017-10-07 ENCOUNTER — Ambulatory Visit (HOSPITAL_COMMUNITY): Payer: Self-pay

## 2017-10-14 ENCOUNTER — Ambulatory Visit (HOSPITAL_COMMUNITY): Payer: Self-pay

## 2017-10-16 ENCOUNTER — Other Ambulatory Visit: Payer: Self-pay | Admitting: "Endocrinology

## 2017-10-17 ENCOUNTER — Other Ambulatory Visit: Payer: Self-pay | Admitting: "Endocrinology

## 2017-10-17 ENCOUNTER — Ambulatory Visit (HOSPITAL_COMMUNITY): Payer: Self-pay

## 2017-10-22 ENCOUNTER — Encounter (HOSPITAL_COMMUNITY): Payer: Self-pay

## 2017-10-22 ENCOUNTER — Encounter (HOSPITAL_COMMUNITY): Payer: Medicare HMO | Attending: Oncology

## 2017-10-22 ENCOUNTER — Other Ambulatory Visit: Payer: Self-pay | Admitting: "Endocrinology

## 2017-10-22 ENCOUNTER — Other Ambulatory Visit: Payer: Self-pay

## 2017-10-22 VITALS — BP 140/40 | HR 66 | Temp 98.5°F | Resp 20

## 2017-10-22 DIAGNOSIS — E538 Deficiency of other specified B group vitamins: Secondary | ICD-10-CM

## 2017-10-22 MED ORDER — CYANOCOBALAMIN 1000 MCG/ML IJ SOLN
1000.0000 ug | Freq: Once | INTRAMUSCULAR | Status: AC
  Administered 2017-10-22: 1000 ug via INTRAMUSCULAR
  Filled 2017-10-22: qty 1

## 2017-10-22 MED ORDER — SODIUM CHLORIDE 0.9% FLUSH
10.0000 mL | INTRAVENOUS | Status: DC | PRN
  Administered 2017-10-22: 10 mL via INTRAVENOUS
  Filled 2017-10-22: qty 10

## 2017-10-22 MED ORDER — HEPARIN SOD (PORK) LOCK FLUSH 100 UNIT/ML IV SOLN
500.0000 [IU] | Freq: Once | INTRAVENOUS | Status: AC
  Administered 2017-10-22: 500 [IU] via INTRAVENOUS
  Filled 2017-10-22: qty 5

## 2017-10-22 NOTE — Progress Notes (Signed)
[name] presents today for injection per the provider's orders. Pt states she has not given herself B12 injections "in a long time". B12 administration without incident; see MAR for injection details.  Patient tolerated procedure well and without incident.  No questions or complaints noted at this time.  [name] presented for Portacath access and flush. Portacath located left chest wall accessed with  H 20 needle.  Good blood return present. Portacath flushed with 66ml NS and 500U/57ml Heparin and needle removed intact.  Procedure tolerated well and without incident.  Discharged via wheelchair.

## 2017-11-06 DIAGNOSIS — E875 Hyperkalemia: Secondary | ICD-10-CM | POA: Diagnosis not present

## 2017-11-06 DIAGNOSIS — I129 Hypertensive chronic kidney disease with stage 1 through stage 4 chronic kidney disease, or unspecified chronic kidney disease: Secondary | ICD-10-CM | POA: Diagnosis not present

## 2017-11-06 DIAGNOSIS — D631 Anemia in chronic kidney disease: Secondary | ICD-10-CM | POA: Diagnosis not present

## 2017-11-06 DIAGNOSIS — E669 Obesity, unspecified: Secondary | ICD-10-CM | POA: Diagnosis not present

## 2017-11-06 DIAGNOSIS — N184 Chronic kidney disease, stage 4 (severe): Secondary | ICD-10-CM | POA: Diagnosis not present

## 2017-11-06 DIAGNOSIS — E1122 Type 2 diabetes mellitus with diabetic chronic kidney disease: Secondary | ICD-10-CM | POA: Diagnosis not present

## 2017-11-06 DIAGNOSIS — N2581 Secondary hyperparathyroidism of renal origin: Secondary | ICD-10-CM | POA: Diagnosis not present

## 2017-11-06 DIAGNOSIS — R809 Proteinuria, unspecified: Secondary | ICD-10-CM | POA: Diagnosis not present

## 2017-11-07 DIAGNOSIS — N393 Stress incontinence (female) (male): Secondary | ICD-10-CM | POA: Diagnosis not present

## 2017-11-07 DIAGNOSIS — Z6838 Body mass index (BMI) 38.0-38.9, adult: Secondary | ICD-10-CM | POA: Diagnosis not present

## 2017-11-07 DIAGNOSIS — R201 Hypoesthesia of skin: Secondary | ICD-10-CM | POA: Diagnosis not present

## 2017-11-07 DIAGNOSIS — Z1389 Encounter for screening for other disorder: Secondary | ICD-10-CM | POA: Diagnosis not present

## 2017-11-07 DIAGNOSIS — E1165 Type 2 diabetes mellitus with hyperglycemia: Secondary | ICD-10-CM | POA: Diagnosis not present

## 2017-11-07 DIAGNOSIS — E1129 Type 2 diabetes mellitus with other diabetic kidney complication: Secondary | ICD-10-CM | POA: Diagnosis not present

## 2017-11-07 DIAGNOSIS — Z0001 Encounter for general adult medical examination with abnormal findings: Secondary | ICD-10-CM | POA: Diagnosis not present

## 2017-11-07 DIAGNOSIS — I4891 Unspecified atrial fibrillation: Secondary | ICD-10-CM | POA: Diagnosis not present

## 2017-11-07 DIAGNOSIS — N184 Chronic kidney disease, stage 4 (severe): Secondary | ICD-10-CM | POA: Diagnosis not present

## 2018-01-03 ENCOUNTER — Emergency Department (HOSPITAL_COMMUNITY): Payer: Medicare HMO

## 2018-01-03 ENCOUNTER — Emergency Department (HOSPITAL_COMMUNITY)
Admission: EM | Admit: 2018-01-03 | Discharge: 2018-01-04 | Disposition: A | Discharge status: Home / Self Care | Payer: Medicare HMO | Attending: Emergency Medicine | Admitting: Emergency Medicine

## 2018-01-03 ENCOUNTER — Encounter (HOSPITAL_COMMUNITY): Payer: Self-pay

## 2018-01-03 ENCOUNTER — Other Ambulatory Visit: Payer: Self-pay

## 2018-01-03 DIAGNOSIS — Z87891 Personal history of nicotine dependence: Secondary | ICD-10-CM | POA: Insufficient documentation

## 2018-01-03 DIAGNOSIS — R531 Weakness: Secondary | ICD-10-CM | POA: Diagnosis not present

## 2018-01-03 DIAGNOSIS — N189 Chronic kidney disease, unspecified: Secondary | ICD-10-CM | POA: Insufficient documentation

## 2018-01-03 DIAGNOSIS — Z794 Long term (current) use of insulin: Secondary | ICD-10-CM | POA: Insufficient documentation

## 2018-01-03 DIAGNOSIS — R4182 Altered mental status, unspecified: Secondary | ICD-10-CM | POA: Diagnosis not present

## 2018-01-03 DIAGNOSIS — Z9011 Acquired absence of right breast and nipple: Secondary | ICD-10-CM | POA: Diagnosis not present

## 2018-01-03 DIAGNOSIS — R404 Transient alteration of awareness: Secondary | ICD-10-CM | POA: Diagnosis not present

## 2018-01-03 DIAGNOSIS — E1122 Type 2 diabetes mellitus with diabetic chronic kidney disease: Secondary | ICD-10-CM | POA: Insufficient documentation

## 2018-01-03 DIAGNOSIS — R4020 Unspecified coma: Secondary | ICD-10-CM | POA: Diagnosis not present

## 2018-01-03 DIAGNOSIS — Z8673 Personal history of transient ischemic attack (TIA), and cerebral infarction without residual deficits: Secondary | ICD-10-CM | POA: Insufficient documentation

## 2018-01-03 DIAGNOSIS — R05 Cough: Secondary | ICD-10-CM | POA: Diagnosis not present

## 2018-01-03 DIAGNOSIS — Z7982 Long term (current) use of aspirin: Secondary | ICD-10-CM | POA: Insufficient documentation

## 2018-01-03 DIAGNOSIS — I129 Hypertensive chronic kidney disease with stage 1 through stage 4 chronic kidney disease, or unspecified chronic kidney disease: Secondary | ICD-10-CM | POA: Diagnosis not present

## 2018-01-03 DIAGNOSIS — Z79899 Other long term (current) drug therapy: Secondary | ICD-10-CM | POA: Insufficient documentation

## 2018-01-03 NOTE — ED Triage Notes (Signed)
Pt arrives via [facility] from home. Pt lives alone, but family checked on her today and called EMS reporting Pt is abnormally weak and has been for the past few days. Pt is non-ambulatory and utilizes a wheelchair to get around.

## 2018-01-03 NOTE — ED Notes (Signed)
Family now at bedside and report Pt has had previous falls and Pt refuses to attempt to walk and the Pt would require intense PT to be able to walk at home by herself.

## 2018-01-03 NOTE — ED Provider Notes (Signed)
[facility] EMERGENCY DEPARTMENT Provider Note   CSN: [id number] Arrival date & time: 01/03/18  2030     History   Chief Complaint Chief Complaint  Patient presents with  . Weakness    HPI [name] is a 84 y.o. female.  She is here for evaluation of confusion, which was noticed tonight by family members who came to see her.  Her grandson had called her earlier, and she told him, that she was hungry.  They last saw her about 10 days ago.  The patient lives alone, and manages her own medications.  When family got to her, they brought her a sandwich, which which she ate easily.  Per family members, at her home, they found a whole week's worth of medicine in her pill planner, that apparently had not been taken for the last week.  Also they did not see evidence that she had eaten today.  This is unusual for the patient, and they have "never seen her like this before."  There are no other known complaints and the patient does not offer any other information.  There is no reported fever, chills, cough, shortness of breath, complaint of chest or back pain.  There are no other known modifying factors.  HPI  Past Medical History:  Diagnosis Date  . Anemia   . Aortic stenosis    Mild  . Arthritis   . Asthma   . Atrial flutter (HCC) 2002  . B12 deficiency 06/20/2015  . B12 deficiency 06/20/2015  . Breast carcinoma (HCC)   . Cerebrovascular disease    with a 60% LICA; repeat study in 10/2009-no obstructive disease; modest ASVD  . Chronic kidney disease    Creatinine-1.5 in 2010 and 2.5-3 in 2011; 1.5-10/2010;  Klebsiella UTI-10/2010. urine protein 36 mg/dl, mildly elevated  . Decreased bone density   . Depression   . Diabetes mellitus   . Diabetic retinopathy   . DJD (degenerative joint disease) 11/14/2011  . Falls infrequently 01/2010   fracture of pelvis and left humerus  . Fibromyalgia   . Headaches, cluster   . Hyperlipidemia   . Hypertension   . IBS (irritable bowel syndrome)     diverticulosis, gastroesophageal reflux disease  . Lower back pain   . Obesity 11/14/2011  . Spinal stenosis     Patient Active Problem List   Diagnosis Date Noted  . Hypoglycemia 02/16/2017  . Acute metabolic encephalopathy 02/16/2017  . Pressure injury of skin 02/16/2017  . Acute kidney injury superimposed on chronic kidney disease (HCC) 11/21/2016  . Hyperglycemia 11/21/2016  . Acute cystitis without hematuria 11/21/2016  . Diarrhea of presumed infectious origin 11/21/2016  . UTI (urinary tract infection) 11/10/2016  . Colitis 11/10/2016  . Elevated troponin I level 11/10/2016  . B12 deficiency 06/20/2015  . UTI (lower urinary tract infection) 05/21/2014  . DJD (degenerative joint disease) 11/14/2011  . Obesity 11/14/2011  . Inflammatory carcinoma of right breast   . Anemia   . IBS (irritable bowel syndrome)   . Essential hypertension   . Aortic stenosis   . Chronic kidney disease   . Fibromyalgia   . Cerebrovascular disease   . Atrial fibrillation (HCC) 07/14/2010  . Falls infrequently 01/31/2010  . HYPERLIPIDEMIA 11/15/2009  . 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 DEPRESSION/ANXIETY 10/12/2009  . SPINAL STENOSIS, LUMBAR 10/12/2009    Past Surgical History:  Procedure Laterality Date  . APPENDECTOMY    . CATARACT EXTRACTION, BILATERAL  Lens implants  . CENTRAL VENOUS CATHETER TUNNELED INSERTION SINGLE LUMEN    . CHOLECYSTECTOMY    . COLONOSCOPY  2012  . EYE SURGERY  nov 2012   for diabetic retinopathy  . LUMBAR DISC SURGERY     lumbosacral spine procedure x 2  . MASTECTOMY     Right breast for carcinoma    OB History    No data available       Home Medications    Prior to Admission medications   Medication Sig Start Date End Date Taking? Authorizing Provider  albuterol (PROAIR HFA) 108 (90 BASE) MCG/ACT inhaler Inhale 2 puffs into the lungs every 6 (six) hours as needed for wheezing or shortness of breath. 06/16/14    Baird Cancer, PA-C  aspirin EC 81 MG tablet Take 81 mg by mouth daily.    [provider]  cyanocobalamin (,VITAMIN B-12,) 1000 MCG/ML injection Inject 1,000 mcg into the muscle every 30 (thirty) days.    [provider]  Diclofenac Sodium 3 % GEL Apply 1 application topically 3 (three) times daily. 01/30/17   [provider]  esomeprazole (NEXIUM) 40 MG capsule Take 40 mg by mouth daily.     [provider]  fenofibrate (TRICOR) 145 MG tablet Take 145 mg by mouth daily.    [provider]  fluticasone (FLONASE) 50 MCG/ACT nasal spray Place 1 spray into both nostrils daily. 01/28/17   Baird Cancer, PA-C  furosemide (LASIX) 40 MG tablet Take 1 tablet (40 mg total) by mouth daily. 11/23/16   Thurnell Lose, MD  HYDROcodone-acetaminophen (NORCO) 10-325 MG per tablet Take 1 tablet by mouth 2 (two) times daily as needed for moderate pain.     [provider]  insulin aspart (NOVOLOG FLEXPEN) 100 UNIT/ML FlexPen Inject 8 Units into the skin 3 (three) times daily with meals. 02/17/17   Johnson, Clanford L, MD  Insulin Degludec (TRESIBA FLEXTOUCH) 200 UNIT/ML SOPN Inject 12 Units into the skin at bedtime. 02/17/17   Johnson, Clanford L, MD  lisinopril (PRINIVIL,ZESTRIL) 20 MG tablet Take 20 mg by mouth daily.    [provider]  metoprolol tartrate (LOPRESSOR) 50 MG tablet Take 50 mg by mouth daily.    [provider]  PARoxetine (PAXIL) 40 MG tablet Take 40 mg by mouth daily.     [provider]  pregabalin (LYRICA) 50 MG capsule Take 50 mg by mouth 3 (three) times daily.     [provider]  raloxifene (EVISTA) 60 MG tablet Take 60 mg by mouth daily.    [provider]  rosuvastatin (CRESTOR) 40 MG tablet Take 1 tablet (40 mg total) by mouth at bedtime. 07/20/15   Herminio Commons, MD  traMADol (ULTRAM) 50 MG tablet Take 50-100 mg by mouth 4 (four) times daily as needed.    [provider]    Family History Family History  Problem Relation Age of Onset  . Alzheimer's disease Mother   . Heart attack Father   . Aneurysm Sister   . Coronary artery disease Brother     Social History Social History   Tobacco Use  . Smoking status: Former Smoker    Packs/day: 1.00    Years: 30.00    Pack years: 30.00    Types: Cigarettes    Last attempt to quit: 1982    Years since quitting: 37.1  . Smokeless tobacco: Never Used  Substance Use Topics  . Alcohol use: No  .  Drug use: No     Allergies   Mold extract [trichophyton mentagrophyte]; Morphine and related; Niaspan [niacin]; and Pollen extract   Review of Systems Review of Systems  All other systems reviewed and are negative.    Physical Exam Updated Vital Signs BP (!) 162/61   Pulse 73   Temp 98.1 F (36.7 C) (Oral)   Resp 20   Ht $R'5\' 10"'fM$  (1.778 m)   Wt 108.9 kg (240 lb)   SpO2 94%   BMI 34.44 kg/m   Physical Exam  Constitutional: She is oriented to person, place, and time. She appears well-developed. No distress.  Overweight, elderly  HENT:  Head: Normocephalic and atraumatic.  Eyes: EOM are normal. Pupils are equal, round, and reactive to light.  Neck: Normal range of motion and phonation normal. Neck supple.  Cardiovascular: Normal rate and regular rhythm.  Pulmonary/Chest: Effort normal and breath sounds normal. No respiratory distress. She exhibits no tenderness.  Abdominal: Soft. She exhibits no distension. There is no tenderness. There is no guarding.  Musculoskeletal: Normal range of motion. She exhibits edema (2-3+ bilateral lower legs).  Neurological: She is alert and oriented to person, place, and time. She exhibits normal muscle tone.  No dysarthria, or aphasia.  Normal strength arms and legs bilaterally.  Skin: Skin is warm and dry.  Psychiatric: She has a normal mood and affect. Her behavior is normal.  Nursing note and vitals reviewed.    ED Treatments / Results  Labs (all  labs ordered are listed, but only abnormal results are displayed) Labs Reviewed - No data to display  EKG  EKG Interpretation  Date/Time:  Friday January 03 2018 20:38:41 EST Ventricular Rate:  73 PR Interval:    QRS Duration: 110 QT Interval:  421 QTC Calculation: 464 R Axis:   35 Text Interpretation:  Sinus rhythm Left atrial enlargement Nonspecific T abnormalities, lateral leads Abnormal ekg Confirmed by Carmin Muskrat [id number]) on 01/03/2018 8:44:25 PM       Radiology No results found.  Procedures Procedures (including critical care time)  Medications Ordered in ED Medications - No data to display   Initial Impression / Assessment and Plan / ED Course  I have reviewed the triage vital signs and the nursing notes.  Pertinent labs & imaging results that were available during my care of the patient were reviewed by me and considered in my medical decision making (see chart for details).      Patient Vitals for the past 24 hrs:  BP Temp Temp src Pulse Resp SpO2 Height Weight  01/03/18 2130 (!) 162/61 - - - 20 - - -  01/03/18 2100 (!) 160/62 - - - 12 - - -  01/03/18 2039 - - - - - - $R'5\' 10"'sy$  (1.778 m) 108.9 kg (240 lb)  01/03/18 2036 (!) 173/62 98.1 F (36.7 C) Oral 73 15 94 % - -    12:27 AM Reevaluation with update and discussion. After initial assessment and treatment, an updated evaluation reveals no change in clinical status.  Initial evaluation, CT normal, labs pending. Daleen Bo     Final Clinical Impressions(s) / ED Diagnoses   Final diagnoses:  Altered mental status, unspecified altered mental status type   Altered mental status apparently onset today, without focal abnormalities.  Patient alert in the emergency department and has eaten prior to arrival.  Suspect nonspecific delirium.  Nursing Notes Reviewed/ Care Coordinated Applicable Imaging Reviewed Interpretation of Laboratory Data incorporated into ED treatment  Plan: Care  to oncoming provider  team to evaluate after completion of workup.  ED Discharge Orders    None       Daleen Bo, MD 01/04/18 [phone]

## 2018-01-04 DIAGNOSIS — I129 Hypertensive chronic kidney disease with stage 1 through stage 4 chronic kidney disease, or unspecified chronic kidney disease: Secondary | ICD-10-CM | POA: Diagnosis not present

## 2018-01-04 DIAGNOSIS — Z7982 Long term (current) use of aspirin: Secondary | ICD-10-CM | POA: Diagnosis not present

## 2018-01-04 DIAGNOSIS — Z794 Long term (current) use of insulin: Secondary | ICD-10-CM | POA: Diagnosis not present

## 2018-01-04 DIAGNOSIS — Z87891 Personal history of nicotine dependence: Secondary | ICD-10-CM | POA: Diagnosis not present

## 2018-01-04 DIAGNOSIS — Z8673 Personal history of transient ischemic attack (TIA), and cerebral infarction without residual deficits: Secondary | ICD-10-CM | POA: Diagnosis not present

## 2018-01-04 DIAGNOSIS — N189 Chronic kidney disease, unspecified: Secondary | ICD-10-CM | POA: Diagnosis not present

## 2018-01-04 DIAGNOSIS — Z79899 Other long term (current) drug therapy: Secondary | ICD-10-CM | POA: Diagnosis not present

## 2018-01-04 DIAGNOSIS — E1122 Type 2 diabetes mellitus with diabetic chronic kidney disease: Secondary | ICD-10-CM | POA: Diagnosis not present

## 2018-01-04 DIAGNOSIS — R4182 Altered mental status, unspecified: Secondary | ICD-10-CM | POA: Diagnosis not present

## 2018-01-04 DIAGNOSIS — Z9011 Acquired absence of right breast and nipple: Secondary | ICD-10-CM | POA: Diagnosis not present

## 2018-01-04 LAB — CBC WITH DIFFERENTIAL/PLATELET
Basophils Absolute: 0 10*3/uL (ref 0.0–0.1)
Basophils Relative: 0 %
EOS ABS: 0.1 10*3/uL (ref 0.0–0.7)
EOS PCT: 2 %
HCT: 31.1 % — ABNORMAL LOW (ref 36.0–46.0)
HEMOGLOBIN: 9.4 g/dL — AB (ref 12.0–15.0)
LYMPHS ABS: 1.4 10*3/uL (ref 0.7–4.0)
Lymphocytes Relative: 19 %
MCH: 27 pg (ref 26.0–34.0)
MCHC: 30.2 g/dL (ref 30.0–36.0)
MCV: 89.4 fL (ref 78.0–100.0)
MONO ABS: 0.4 10*3/uL (ref 0.1–1.0)
MONOS PCT: 6 %
Neutro Abs: 5.3 10*3/uL (ref 1.7–7.7)
Neutrophils Relative %: 73 %
PLATELETS: 171 10*3/uL (ref 150–400)
RBC: 3.48 MIL/uL — ABNORMAL LOW (ref 3.87–5.11)
RDW: 13.6 % (ref 11.5–15.5)
WBC: 7.2 10*3/uL (ref 4.0–10.5)

## 2018-01-04 LAB — INFLUENZA PANEL BY PCR (TYPE A & B)
INFLAPCR: NEGATIVE
Influenza B By PCR: NEGATIVE

## 2018-01-04 LAB — COMPREHENSIVE METABOLIC PANEL WITH GFR
ALT: 12 U/L — ABNORMAL LOW (ref 14–54)
AST: 14 U/L — ABNORMAL LOW (ref 15–41)
Albumin: 3.2 g/dL — ABNORMAL LOW (ref 3.5–5.0)
Alkaline Phosphatase: 46 U/L (ref 38–126)
Anion gap: 11 (ref 5–15)
BUN: 83 mg/dL — ABNORMAL HIGH (ref 6–20)
CO2: 26 mmol/L (ref 22–32)
Calcium: 8.8 mg/dL — ABNORMAL LOW (ref 8.9–10.3)
Chloride: 106 mmol/L (ref 101–111)
Creatinine, Ser: 2.46 mg/dL — ABNORMAL HIGH (ref 0.44–1.00)
GFR calc Af Amer: 20 mL/min — ABNORMAL LOW
GFR calc non Af Amer: 17 mL/min — ABNORMAL LOW
Glucose, Bld: 228 mg/dL — ABNORMAL HIGH (ref 65–99)
Potassium: 3.9 mmol/L (ref 3.5–5.1)
Sodium: 143 mmol/L (ref 135–145)
Total Bilirubin: 0.5 mg/dL (ref 0.3–1.2)
Total Protein: 6.9 g/dL (ref 6.5–8.1)

## 2018-01-04 LAB — URINALYSIS, ROUTINE W REFLEX MICROSCOPIC
Bacteria, UA: NONE SEEN
Bilirubin Urine: NEGATIVE
Glucose, UA: NEGATIVE mg/dL
Ketones, ur: NEGATIVE mg/dL
Leukocytes, UA: NEGATIVE
Nitrite: NEGATIVE
Protein, ur: NEGATIVE mg/dL
Specific Gravity, Urine: 1.01 (ref 1.005–1.030)
Squamous Epithelial / HPF: NONE SEEN
pH: 5 (ref 5.0–8.0)

## 2018-01-04 LAB — CBG MONITORING, ED: Glucose-Capillary: 253 mg/dL — ABNORMAL HIGH (ref 65–99)

## 2018-01-04 LAB — TROPONIN I: Troponin I: 0.03 ng/mL (ref <0.03)

## 2018-01-04 LAB — ETHANOL: Alcohol, Ethyl (B): <10 mg/dL (ref <10)

## 2018-01-04 LAB — AMMONIA: AMMONIA: 18 umol/L (ref 9–35)

## 2018-01-04 MED ORDER — HEPARIN SOD (PORK) LOCK FLUSH 100 UNIT/ML IV SOLN
500.0000 [IU] | Freq: Once | INTRAVENOUS | Status: AC
  Administered 2018-01-04: 500 [IU]
  Filled 2018-01-04: qty 5

## 2018-01-04 NOTE — ED Notes (Addendum)
Pt given fluids to complete Fluid Challenge. Result: PASSED. Pt able to tolerate fluids.

## 2018-01-04 NOTE — ED Notes (Signed)
RN asked Pt to ambulate with him around the room to assess gait and mobility despite previous remarks of being wheelchair bound at home, and Pt refused to try to ambulate, stating she is "too weak in the legs to walk."

## 2018-01-04 NOTE — ED Provider Notes (Signed)
Care assumed from Dr. Eulis Foster.  Patient presents from home with generalized weakness and not acting like herself.  She is alert and oriented x3 and denies any pain.  CT head and chest x-ray are negative.  Labs are pending. Symptoms improved after eating at home.  Laboratory studies are reassuring.  Patient found to have chronic elevated creatinine.  Urinalysis is negative.  Chest x-ray is negative. Minimal troponin elevation similar to previous.  Patient is resting in the ED comfortably.  Family has left.  Blood sugar has been stable.  There is no evidence of infectious process. Patient states she feels at her baseline and normally does not walk. Flu swab is negative.  Patient appears stable for discharge. Followup with PCP. Return precautions discussed. Will ask for home health evaluation.  BP (!) 149/60   Pulse 67   Temp 99.8 F (37.7 C) (Oral)   Resp 18   Ht $R'5\' 10"'HA$  (1.778 m)   Wt 108.9 kg (240 lb)   SpO2 100%   BMI 34.44 kg/m     Ezequiel Essex, MD 01/04/18 [phone]

## 2018-01-04 NOTE — ED Notes (Signed)
Date and time results received: 01/04/18  0344  Test: Troponin Critical Value: 0.03  Name of Provider Notified: Rancour, MD

## 2018-01-04 NOTE — Discharge Instructions (Signed)
Chest x-ray and urinalysis are negative.  Blood sugars have been stable.  You may be developing a viral illness, possibly influenza. follow-up with your doctor this week.  Return to the ED if you develop new or worsening symptoms.

## 2018-01-04 NOTE — ED Notes (Signed)
PT's family to come pick up pt within the hour.

## 2018-01-04 NOTE — ED Notes (Signed)
Date and time results received: 01/04/18 0027   Test: ABG Critical Value: pO2: below readable range                        pCO2: 44.7                        PH:  7.415  Name of Provider Notified: Rancour, MD

## 2018-01-06 LAB — BLOOD GAS, VENOUS
FIO2: 0.21
O2 Saturation: 51.4 %
PCO2 VEN: 43.1 mmHg — AB (ref 44.0–60.0)
pH, Ven: 7.433 — ABNORMAL HIGH (ref 7.250–7.430)

## 2018-01-13 ENCOUNTER — Other Ambulatory Visit: Payer: Self-pay | Admitting: "Endocrinology

## 2018-01-29 ENCOUNTER — Ambulatory Visit (HOSPITAL_COMMUNITY): Payer: Self-pay | Admitting: Adult Health

## 2018-02-05 ENCOUNTER — Other Ambulatory Visit: Payer: Self-pay | Admitting: "Endocrinology

## 2018-02-11 ENCOUNTER — Other Ambulatory Visit: Payer: Self-pay | Admitting: "Endocrinology

## 2018-02-11 DIAGNOSIS — E1140 Type 2 diabetes mellitus with diabetic neuropathy, unspecified: Secondary | ICD-10-CM | POA: Diagnosis not present

## 2018-02-11 DIAGNOSIS — Z6838 Body mass index (BMI) 38.0-38.9, adult: Secondary | ICD-10-CM | POA: Diagnosis not present

## 2018-02-11 DIAGNOSIS — L309 Dermatitis, unspecified: Secondary | ICD-10-CM | POA: Diagnosis not present

## 2018-02-11 DIAGNOSIS — E1129 Type 2 diabetes mellitus with other diabetic kidney complication: Secondary | ICD-10-CM | POA: Diagnosis not present

## 2018-02-11 DIAGNOSIS — G894 Chronic pain syndrome: Secondary | ICD-10-CM | POA: Diagnosis not present

## 2018-02-13 ENCOUNTER — Encounter (HOSPITAL_COMMUNITY): Payer: Self-pay | Admitting: Adult Health

## 2018-02-13 ENCOUNTER — Inpatient Hospital Stay (HOSPITAL_COMMUNITY): Payer: Medicare HMO | Attending: Oncology | Admitting: Adult Health

## 2018-02-13 VITALS — BP 132/36 | HR 69 | Temp 97.6°F | Resp 18

## 2018-02-13 DIAGNOSIS — E669 Obesity, unspecified: Secondary | ICD-10-CM

## 2018-02-13 DIAGNOSIS — Z9011 Acquired absence of right breast and nipple: Secondary | ICD-10-CM | POA: Diagnosis not present

## 2018-02-13 DIAGNOSIS — Z794 Long term (current) use of insulin: Secondary | ICD-10-CM | POA: Insufficient documentation

## 2018-02-13 DIAGNOSIS — Z853 Personal history of malignant neoplasm of breast: Secondary | ICD-10-CM

## 2018-02-13 DIAGNOSIS — R51 Headache: Secondary | ICD-10-CM | POA: Insufficient documentation

## 2018-02-13 DIAGNOSIS — D649 Anemia, unspecified: Secondary | ICD-10-CM | POA: Insufficient documentation

## 2018-02-13 DIAGNOSIS — G629 Polyneuropathy, unspecified: Secondary | ICD-10-CM

## 2018-02-13 DIAGNOSIS — I129 Hypertensive chronic kidney disease with stage 1 through stage 4 chronic kidney disease, or unspecified chronic kidney disease: Secondary | ICD-10-CM | POA: Diagnosis not present

## 2018-02-13 DIAGNOSIS — K589 Irritable bowel syndrome without diarrhea: Secondary | ICD-10-CM | POA: Diagnosis not present

## 2018-02-13 DIAGNOSIS — E785 Hyperlipidemia, unspecified: Secondary | ICD-10-CM | POA: Diagnosis not present

## 2018-02-13 DIAGNOSIS — R21 Rash and other nonspecific skin eruption: Secondary | ICD-10-CM | POA: Insufficient documentation

## 2018-02-13 DIAGNOSIS — M797 Fibromyalgia: Secondary | ICD-10-CM | POA: Insufficient documentation

## 2018-02-13 DIAGNOSIS — R0602 Shortness of breath: Secondary | ICD-10-CM | POA: Diagnosis not present

## 2018-02-13 DIAGNOSIS — K5900 Constipation, unspecified: Secondary | ICD-10-CM | POA: Diagnosis not present

## 2018-02-13 DIAGNOSIS — M129 Arthropathy, unspecified: Secondary | ICD-10-CM | POA: Insufficient documentation

## 2018-02-13 DIAGNOSIS — R197 Diarrhea, unspecified: Secondary | ICD-10-CM

## 2018-02-13 DIAGNOSIS — J449 Chronic obstructive pulmonary disease, unspecified: Secondary | ICD-10-CM

## 2018-02-13 DIAGNOSIS — E1122 Type 2 diabetes mellitus with diabetic chronic kidney disease: Secondary | ICD-10-CM | POA: Diagnosis not present

## 2018-02-13 DIAGNOSIS — M1990 Unspecified osteoarthritis, unspecified site: Secondary | ICD-10-CM | POA: Diagnosis not present

## 2018-02-13 DIAGNOSIS — Z87891 Personal history of nicotine dependence: Secondary | ICD-10-CM | POA: Diagnosis not present

## 2018-02-13 DIAGNOSIS — I350 Nonrheumatic aortic (valve) stenosis: Secondary | ICD-10-CM | POA: Diagnosis not present

## 2018-02-13 DIAGNOSIS — Z1231 Encounter for screening mammogram for malignant neoplasm of breast: Secondary | ICD-10-CM

## 2018-02-13 DIAGNOSIS — F329 Major depressive disorder, single episode, unspecified: Secondary | ICD-10-CM | POA: Insufficient documentation

## 2018-02-13 DIAGNOSIS — R5383 Other fatigue: Secondary | ICD-10-CM | POA: Diagnosis not present

## 2018-02-13 DIAGNOSIS — C50911 Malignant neoplasm of unspecified site of right female breast: Secondary | ICD-10-CM

## 2018-02-13 DIAGNOSIS — N189 Chronic kidney disease, unspecified: Secondary | ICD-10-CM

## 2018-02-13 DIAGNOSIS — Z79899 Other long term (current) drug therapy: Secondary | ICD-10-CM

## 2018-02-13 DIAGNOSIS — E538 Deficiency of other specified B group vitamins: Secondary | ICD-10-CM | POA: Diagnosis not present

## 2018-02-13 NOTE — Progress Notes (Signed)
[name], [name]   CLINIC:  Medical Oncology/Hematology  PCP:  [facility], [place] [address] [phone]   REASON FOR VISIT:  Follow-up for (R) breast inflammatory breast cancer; ER-/PR-/HER2+  CURRENT THERAPY: Observation    HISTORY OF PRESENT ILLNESS:  (From Kirby Crigler, PA-C's note on 01/28/17)       INTERVAL HISTORY:  Ms. Vossler 84 y.o. female returns for routine follow-up for h/o right breast inflammatory breast cancer.   Seen seated in wheelchair. Here today unaccompanied; shares with me that she lives alone.   Overall, she tells me she has been feeling "okay." Appetite 100%; energy levels 50%.  Endorses back and leg pain, which has been chronic. Denies any recent falls.  She is able to use a walker at home, but spends most of her time in a wheelchair.     She has several chronic complaints, which are largely stable and include: fatigue, shortness of breath ("because I have COPD"), constipation, diarrhea, rash/itching to back, headaches (which are periodic and unchanged), peripheral neuropathy, and easy bleeding/bruising.   She thinks she may have felt a lump in her (L) breast ~1-2 weeks ago, "but I'm not sure."   Denies any skin changes or ulcerations to her breast or chest wall.  She tells me that her mammogram is up-to-date.    Her PCP is Dr. Gerarda Fraction; she tells me that she sees him periodically.   Otherwise, she is largely without other complaints today.    REVIEW OF SYSTEMS:  Review of Systems - Oncology Per HPI. 14-point ROS completed and negative except as stated above.   PAST MEDICAL/SURGICAL HISTORY:  Past Medical History:  Diagnosis Date  . Anemia   . Aortic stenosis    Mild  . Arthritis   . Asthma   . Atrial flutter ([place]) 2002  . B12 deficiency 06/20/2015  . B12 deficiency 06/20/2015  . Breast carcinoma ([place])   . Cerebrovascular disease    with a 01% LICA; repeat  study in 10/2009-no obstructive disease; modest ASVD  . Chronic kidney disease    Creatinine-1.5 in 2010 and 2.5-3 in 2011; 1.5-10/2010;  Klebsiella UTI-10/2010. urine protein 36 mg/dl, mildly elevated  . Decreased bone density   . Depression   . Diabetes mellitus   . Diabetic retinopathy   . DJD (degenerative joint disease) 11/14/2011  . Falls infrequently 01/2010   fracture of pelvis and left humerus  . Fibromyalgia   . Headaches, cluster   . Hyperlipidemia   . Hypertension   . IBS (irritable bowel syndrome)    diverticulosis, gastroesophageal reflux disease  . Lower back pain   . Obesity 11/14/2011  . Spinal stenosis    Past Surgical History:  Procedure Laterality Date  . APPENDECTOMY    . CATARACT EXTRACTION, BILATERAL     Lens implants  . CENTRAL VENOUS CATHETER TUNNELED INSERTION SINGLE LUMEN    . CHOLECYSTECTOMY    . COLONOSCOPY  2012  . EYE SURGERY  nov 2012   for diabetic retinopathy  . LUMBAR DISC SURGERY     lumbosacral spine procedure x 2  . MASTECTOMY     Right breast for carcinoma     SOCIAL HISTORY:  Social History   Socioeconomic History  . Marital status: Widowed    Spouse name: Not on file  . Number of children: 2  . Years of education: Not on file  . Highest education level: Not on  file  Social Needs  . Financial resource strain: Not on file  . Food insecurity - worry: Not on file  . Food insecurity - inability: Not on file  . Transportation needs - medical: Not on file  . Transportation needs - non-medical: Not on file  Occupational History  . Occupation: On disability    Employer: RETIRED  Tobacco Use  . Smoking status: Former Smoker    Packs/day: 1.00    Years: 30.00    Pack years: 30.00    Types: Cigarettes    Last attempt to quit: 1982    Years since quitting: 37.2  . Smokeless tobacco: Never Used  Substance and Sexual Activity  . Alcohol use: No  . Drug use: No  . Sexual activity: Not on file  Other Topics Concern  . Not on  file  Social History Narrative   Lives alone    FAMILY HISTORY:  Family History  Problem Relation Age of Onset  . Alzheimer's disease Mother   . Heart attack Father   . Aneurysm Sister   . Coronary artery disease Brother     CURRENT MEDICATIONS:  Outpatient Encounter Medications as of 02/13/2018  Medication Sig  . albuterol (PROAIR HFA) 108 (90 BASE) MCG/ACT inhaler Inhale 2 puffs into the lungs every 6 (six) hours as needed for wheezing or shortness of breath.  Marland Kitchen aspirin EC 81 MG tablet Take 81 mg by mouth daily.  . cyanocobalamin (,VITAMIN B-12,) 1000 MCG/ML injection Inject 1,000 mcg into the muscle every 30 (thirty) days.  . Diclofenac Sodium 3 % GEL Apply 1 application topically 3 (three) times daily.  Marland Kitchen esomeprazole (NEXIUM) 40 MG capsule Take 40 mg by mouth daily.   . fenofibrate (TRICOR) 145 MG tablet Take 145 mg by mouth daily.  . fluticasone (FLONASE) 50 MCG/ACT nasal spray Place 1 spray into both nostrils daily.  . furosemide (LASIX) 40 MG tablet Take 1 tablet (40 mg total) by mouth daily.  Marland Kitchen HYDROcodone-acetaminophen (NORCO) 10-325 MG per tablet Take 1 tablet by mouth 2 (two) times daily as needed for moderate pain.   Marland Kitchen insulin aspart (NOVOLOG FLEXPEN) 100 UNIT/ML FlexPen Inject 8 Units into the skin 3 (three) times daily with meals.  . Insulin Degludec (TRESIBA FLEXTOUCH) 200 UNIT/ML SOPN Inject 12 Units into the skin at bedtime.  Marland Kitchen lisinopril (PRINIVIL,ZESTRIL) 20 MG tablet Take 20 mg by mouth daily.  . metoprolol tartrate (LOPRESSOR) 50 MG tablet Take 50 mg by mouth daily.  Marland Kitchen PARoxetine (PAXIL) 40 MG tablet Take 40 mg by mouth daily.   . pregabalin (LYRICA) 50 MG capsule Take 50 mg by mouth 3 (three) times daily.   . raloxifene (EVISTA) 60 MG tablet Take 60 mg by mouth daily.  . rosuvastatin (CRESTOR) 40 MG tablet Take 1 tablet (40 mg total) by mouth at bedtime.  . traMADol (ULTRAM) 50 MG tablet Take 50-100 mg by mouth 4 (four) times daily as needed.    Facility-Administered Encounter Medications as of 02/13/2018  Medication  . sodium chloride 0.9 % injection 10 mL    ALLERGIES:  Allergies  Allergen Reactions  . Mold Extract [Trichophyton Mentagrophyte] Other (See Comments)    Reaction:  Itchy, watery eyes/sneezing   . Morphine And Related Other (See Comments)    Reaction:  Drowsiness. "I didn't wake up for almost 1 week" after taking morphine.   . Niaspan [Niacin] Other (See Comments)    Reaction:  Flushing   . Pollen Extract Other (See Comments)  Reaction:  Itchy, watery eyes/sneezing      PHYSICAL EXAM:  ECOG Performance status: 1-2 - Symptomatic; may require occasional assistance (but lives alone per her report)  Vitals:   02/13/18 1446  BP: (!) 132/36  Pulse: 69  Resp: 18  Temp: 97.6 F (36.4 C)  SpO2: 94%   There were no vitals filed for this visit.  Physical Exam  Constitutional: She is oriented to person, place, and time and well-developed, well-nourished, and in no distress.  Chronically-ill appearing female in no acute distress.  Exam done with patient seated in wheelchair   HENT:  Head: Normocephalic.  Mouth/Throat: Oropharynx is clear and moist. No oropharyngeal exudate.  Eyes: Conjunctivae are normal. Pupils are equal, round, and reactive to light. No scleral icterus.  Neck: Normal range of motion. Neck supple.  Cardiovascular: Normal rate.  Irregularly regular   Pulmonary/Chest: Effort normal. No respiratory distress.    Diminished breath sounds   Abdominal: Soft. Bowel sounds are normal. There is no tenderness.  Musculoskeletal: She exhibits edema (Trace ankle edema bilat ).  Lymphadenopathy:    She has no cervical adenopathy.       Right: No supraclavicular adenopathy present.       Left: No supraclavicular adenopathy present.  Neurological: She is alert and oriented to person, place, and time. No cranial nerve deficit.  Skin: Skin is warm and dry. No rash noted.  Psychiatric: Mood, memory,  affect and judgment normal.  Nursing note and vitals reviewed.    LABORATORY DATA:  I have reviewed the labs as listed.  CBC    Component Value Date/Time   WBC 7.2 01/04/2018 0002   RBC 3.48 (L) 01/04/2018 0002   HGB 9.4 (L) 01/04/2018 0002   HCT 31.1 (L) 01/04/2018 0002   PLT 171 01/04/2018 0002   MCV 89.4 01/04/2018 0002   MCH 27.0 01/04/2018 0002   MCHC 30.2 01/04/2018 0002   RDW 13.6 01/04/2018 0002   LYMPHSABS 1.4 01/04/2018 0002   MONOABS 0.4 01/04/2018 0002   EOSABS 0.1 01/04/2018 0002   BASOSABS 0.0 01/04/2018 0002   CMP Latest Ref Rng & Units 01/04/2018 02/16/2017 02/15/2017  Glucose 65 - 99 mg/dL 228(H) 307(H) 204(H)  BUN 6 - 20 mg/dL 83(H) 80(H) 87(H)  Creatinine 0.44 - 1.00 mg/dL 2.46(H) 2.80(H) 2.97(H)  Sodium 135 - 145 mmol/L 143 138 136  Potassium 3.5 - 5.1 mmol/L 3.9 4.9 3.2(L)  Chloride 101 - 111 mmol/L 106 101 101  CO2 22 - 32 mmol/L _0 Calcium 8.9 - 10.3 mg/dL 8.8(L) 9.0 8.6(L)  Total Protein 6.5 - 8.1 g/dL 6.9 - 6.3(L)  Total Bilirubin 0.3 - 1.2 mg/dL 0.5 - 0.3  Alkaline Phos 38 - 126 U/L 46 - 33(L)  AST 15 - 41 U/L 14(L) - 18  ALT 14 - 54 U/L 12(L) - 12(L)    PENDING LABS:    DIAGNOSTIC IMAGING:  *The following radiologic images and reports have been reviewed independently and agree with below findings.  Last mammogram 09/09/17 CLINICAL DATA:  Screening.  EXAM: 2D DIGITAL SCREENING UNILATERAL LEFT MAMMOGRAM WITH CAD AND ADJUNCT TOMO  COMPARISON:  Previous exam(s).  ACR Breast Density Category b: There are scattered areas of fibroglandular density.  FINDINGS: There are no findings suspicious for malignancy. Images were processed with CAD.  IMPRESSION: No mammographic evidence of malignancy. A result letter of this screening mammogram will be mailed directly to the patient.  RECOMMENDATION: Screening mammogram in one year. (Code:SM-B-01Y)  BI-RADS CATEGORY  1: Negative.   Electronically Signed   By: Marin Olp  M.D.   On: 09/10/2017 12:26    PATHOLOGY:     ASSESSMENT & PLAN:   (R) breast inflammatory breast cancer; ER-/PR-/HER2+: -Diagnosed in 10/2006. Treated with FEC chemotherapy x 4 cycles. Went on to have (R) mastectomy. Then received Taxotere/Navelbine in the adjuvant setting for only 2 of 4 planned cycles (because of side effects), then Herceptin for 52 weeks.  Declined adjuvant radiation therapy, but did complete her anti-HER2 therapy as planned.   -No role for anti-estrogen therapy given ER/PR negative disease.  -Unilateral (L) breast screening mammogram done on 09/09/17 and negative. She will be due for annual screening exam in 09/2018; orders placed today.  -Clinical breast exam performed today and benign. Pt's reported (L) breast concerns without palpable finding on exam (see below).  -Return to cancer center in 1 year for follow-up. No oncologic reason for labs.   Patient-reported (L) breast concerns:  -No palpable mass or nodularity on clinical breast exam today. I suspect she may have felt either rib or glandular tissue, as her breast is large and pendulous "and I don't wear a bra." She was unable to locate the lump of concern at time of visit today.  -Offered (L) breast diagnostic mammogram for further evaluation given patient's concerns, however she declined. I explained that imaging would help rule out any abnormality there that was not palpable, but she declined.  Encouraged her to call us if she has new/worsening concerns and we can always see her sooner than scheduled and/or obtain additional imaging with diagnostic mammogram or ultrasound, if needed. She agreed with this plan.      Dispo:  -(L) breast screening mammogram 09/2018; orders placed today.  -Return to cancer center in 1 year for follow-up; no labs needed.     All questions were answered to patient's stated satisfaction. Encouraged patient to call with any new concerns or questions before her next visit to the  cancer center and we can certain see her sooner, if needed.      Orders placed this encounter:  Orders Placed This Encounter  Procedures  . MM SCREENING BREAST TOMO [place], NP [place] [phone]

## 2018-02-16 ENCOUNTER — Encounter (HOSPITAL_COMMUNITY): Payer: Self-pay | Admitting: Adult Health

## 2018-03-12 ENCOUNTER — Other Ambulatory Visit: Payer: Self-pay | Admitting: "Endocrinology

## 2018-04-03 ENCOUNTER — Other Ambulatory Visit: Payer: Self-pay | Admitting: "Endocrinology

## 2018-04-10 ENCOUNTER — Other Ambulatory Visit: Payer: Self-pay | Admitting: "Endocrinology

## 2018-05-02 DIAGNOSIS — E1129 Type 2 diabetes mellitus with other diabetic kidney complication: Secondary | ICD-10-CM | POA: Diagnosis not present

## 2018-05-02 DIAGNOSIS — I10 Essential (primary) hypertension: Secondary | ICD-10-CM | POA: Diagnosis not present

## 2018-05-02 DIAGNOSIS — Z6837 Body mass index (BMI) 37.0-37.9, adult: Secondary | ICD-10-CM | POA: Diagnosis not present

## 2018-05-02 DIAGNOSIS — Z1389 Encounter for screening for other disorder: Secondary | ICD-10-CM | POA: Diagnosis not present

## 2018-05-02 DIAGNOSIS — N184 Chronic kidney disease, stage 4 (severe): Secondary | ICD-10-CM | POA: Diagnosis not present

## 2018-05-02 DIAGNOSIS — G894 Chronic pain syndrome: Secondary | ICD-10-CM | POA: Diagnosis not present

## 2018-05-09 ENCOUNTER — Other Ambulatory Visit: Payer: Self-pay | Admitting: "Endocrinology

## 2018-05-15 DIAGNOSIS — C50912 Malignant neoplasm of unspecified site of left female breast: Secondary | ICD-10-CM | POA: Diagnosis not present

## 2018-05-21 DIAGNOSIS — C50912 Malignant neoplasm of unspecified site of left female breast: Secondary | ICD-10-CM | POA: Diagnosis not present

## 2018-06-06 ENCOUNTER — Other Ambulatory Visit: Payer: Self-pay | Admitting: "Endocrinology

## 2018-06-09 ENCOUNTER — Other Ambulatory Visit: Payer: Self-pay | Admitting: "Endocrinology

## 2018-06-13 ENCOUNTER — Ambulatory Visit: Payer: Self-pay | Admitting: Podiatry

## 2018-07-09 DIAGNOSIS — C50919 Malignant neoplasm of unspecified site of unspecified female breast: Secondary | ICD-10-CM | POA: Diagnosis not present

## 2018-07-09 DIAGNOSIS — E1122 Type 2 diabetes mellitus with diabetic chronic kidney disease: Secondary | ICD-10-CM | POA: Diagnosis not present

## 2018-07-09 DIAGNOSIS — M1990 Unspecified osteoarthritis, unspecified site: Secondary | ICD-10-CM | POA: Diagnosis not present

## 2018-07-09 DIAGNOSIS — E669 Obesity, unspecified: Secondary | ICD-10-CM | POA: Diagnosis not present

## 2018-07-09 DIAGNOSIS — N2581 Secondary hyperparathyroidism of renal origin: Secondary | ICD-10-CM | POA: Diagnosis not present

## 2018-07-09 DIAGNOSIS — N189 Chronic kidney disease, unspecified: Secondary | ICD-10-CM | POA: Diagnosis not present

## 2018-07-09 DIAGNOSIS — N184 Chronic kidney disease, stage 4 (severe): Secondary | ICD-10-CM | POA: Diagnosis not present

## 2018-07-09 DIAGNOSIS — I129 Hypertensive chronic kidney disease with stage 1 through stage 4 chronic kidney disease, or unspecified chronic kidney disease: Secondary | ICD-10-CM | POA: Diagnosis not present

## 2018-07-09 DIAGNOSIS — D631 Anemia in chronic kidney disease: Secondary | ICD-10-CM | POA: Diagnosis not present

## 2018-07-15 DIAGNOSIS — L8991 Pressure ulcer of unspecified site, stage 1: Secondary | ICD-10-CM | POA: Diagnosis not present

## 2018-07-15 DIAGNOSIS — E1165 Type 2 diabetes mellitus with hyperglycemia: Secondary | ICD-10-CM | POA: Diagnosis not present

## 2018-07-15 DIAGNOSIS — Z6837 Body mass index (BMI) 37.0-37.9, adult: Secondary | ICD-10-CM | POA: Diagnosis not present

## 2018-07-16 DIAGNOSIS — E1165 Type 2 diabetes mellitus with hyperglycemia: Secondary | ICD-10-CM | POA: Diagnosis not present

## 2018-07-16 DIAGNOSIS — E1129 Type 2 diabetes mellitus with other diabetic kidney complication: Secondary | ICD-10-CM | POA: Diagnosis not present

## 2018-07-16 IMAGING — CT CT ABD-PELV W/O CM
2 of 4 series · 16 of 46 positions shown, 18 images · non-contrast
Comparison: 05/20/2014

CLINICAL DATA: Confusion, diarrhea, abdominal pain for 3 days.
History of diabetes, breast cancer, IBS. Cholecystectomy,
appendectomy.

EXAM:
CT ABDOMEN AND PELVIS WITHOUT CONTRAST
TECHNIQUE: Multidetector CT imaging of the abdomen and pelvis was performed
following the standard protocol without IV contrast.

[Series 2: axial st · axial · 0.98mm/px · z∈[-797,-342]mm · 13 of 101 slices shown, 15 images]
[im 5/101  soft-tissue]
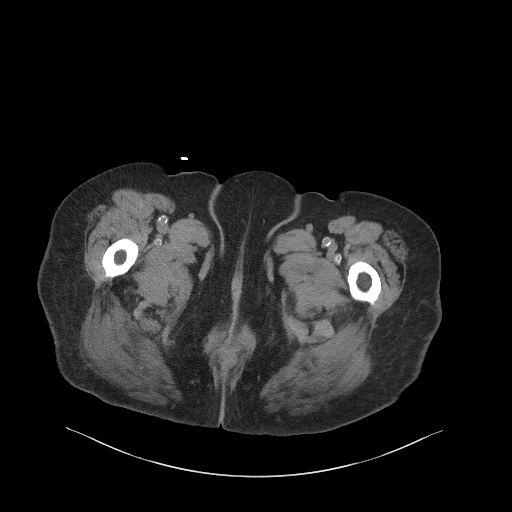
[im 5/101  bone]
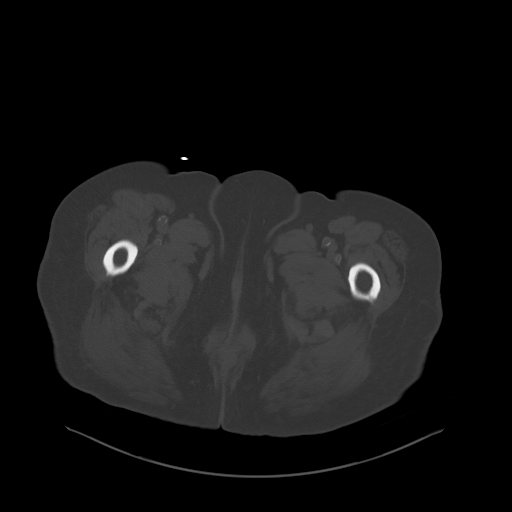
[im 13/101  soft-tissue]
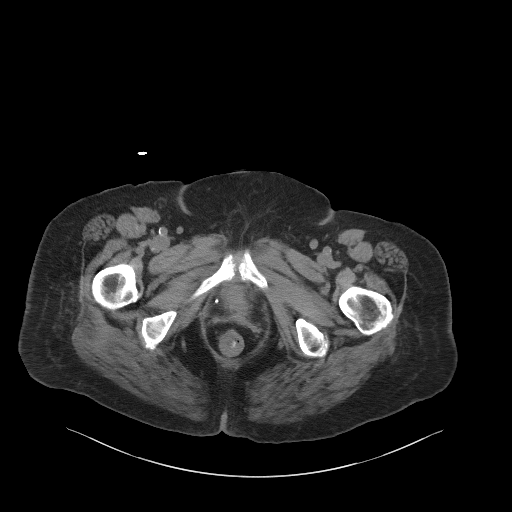
[im 21/101  soft-tissue]
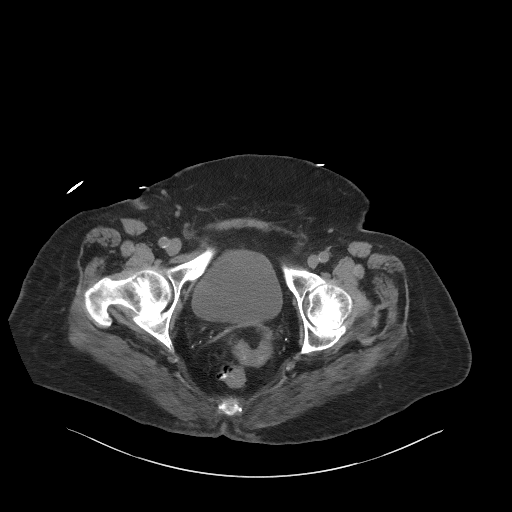
[im 30/101  soft-tissue]
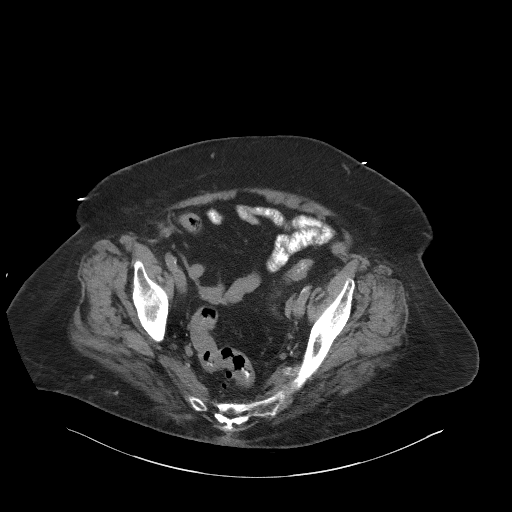
[im 34/101  soft-tissue]
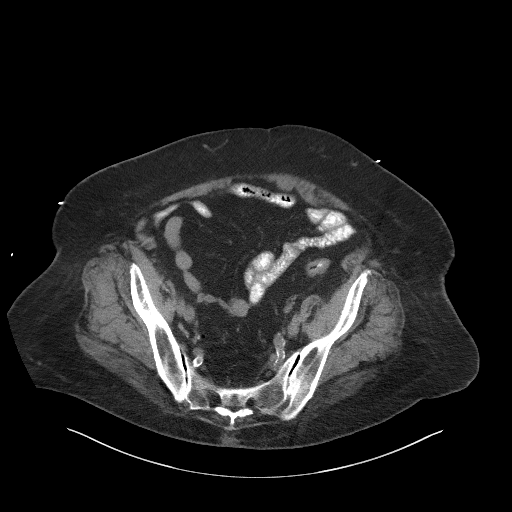
[im 42/101  soft-tissue]
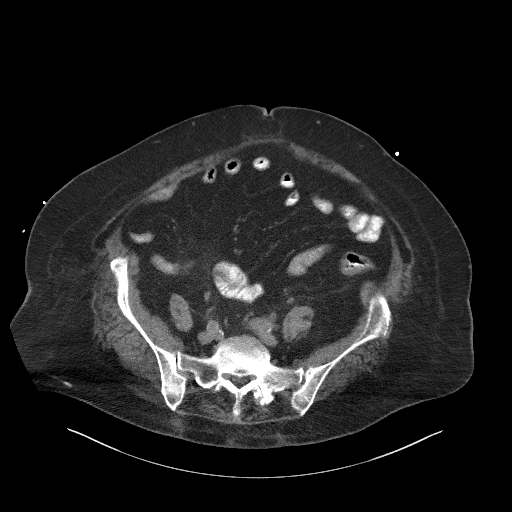
[im 51/101  soft-tissue]
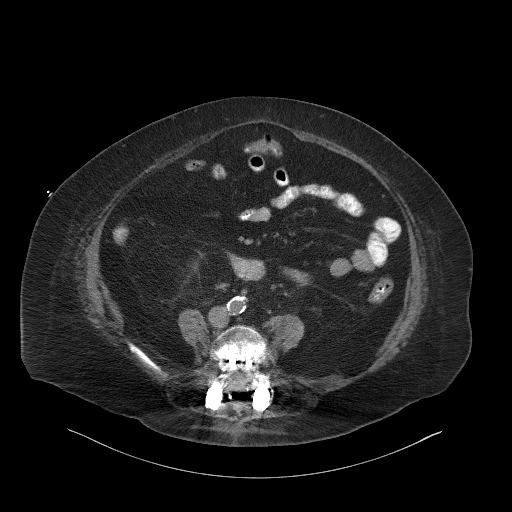
[im 59/101  soft-tissue]
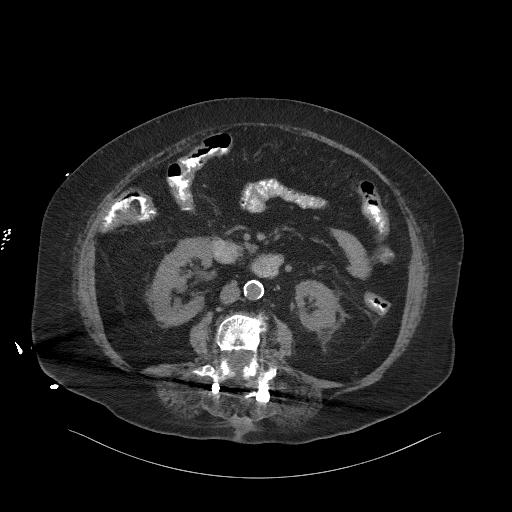
[im 67/101  soft-tissue]
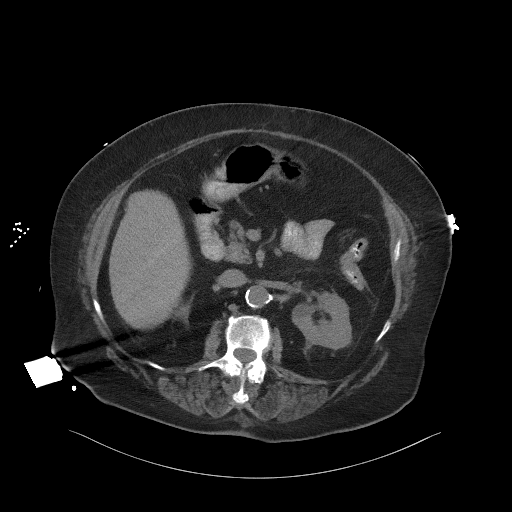
[im 67/101  bone]
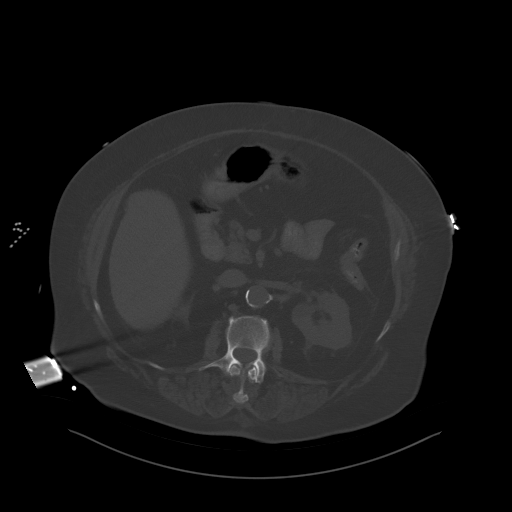
[im 71/101  soft-tissue]
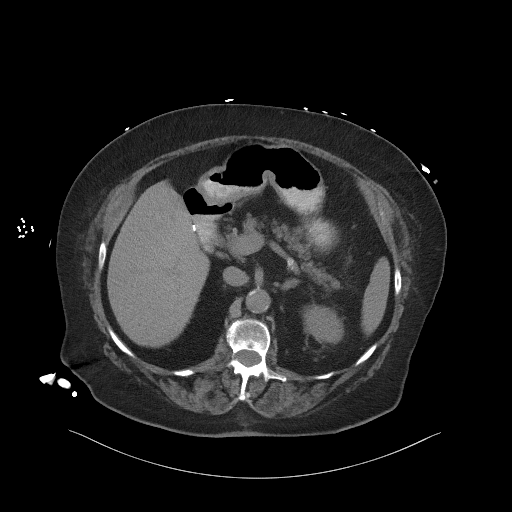
[im 80/101  soft-tissue]
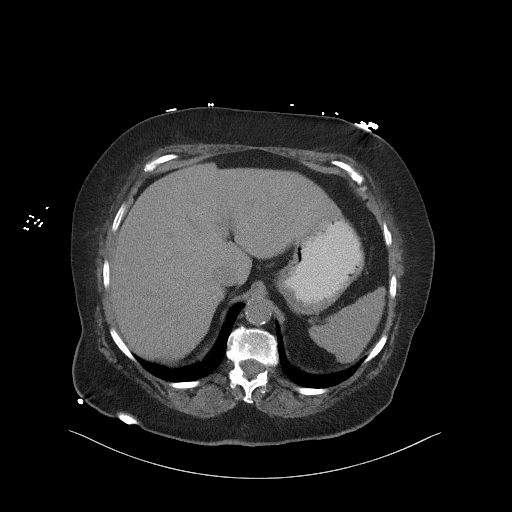
[im 88/101  soft-tissue]
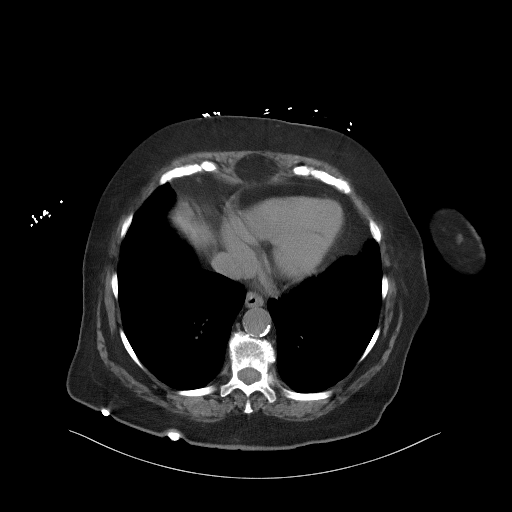
[im 96/101  soft-tissue]
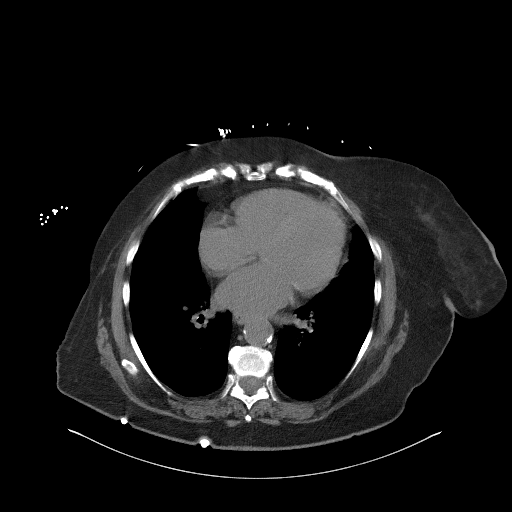

[Series 4: coronal st · coronal · 0.96mm/px · 3 of 119 slices shown]
[im 40/119  soft-tissue]
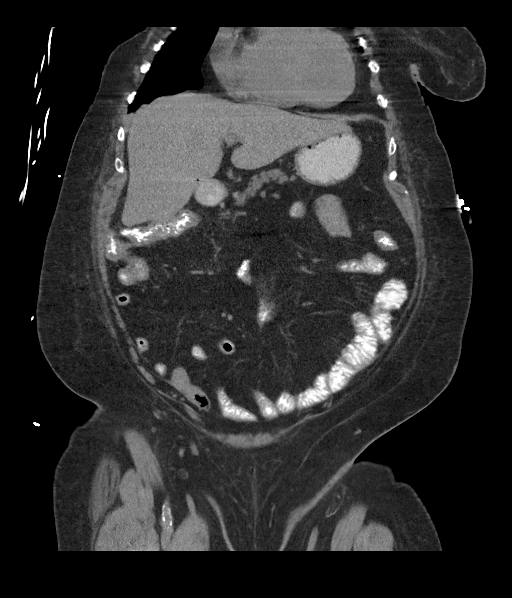
[im 53/119  soft-tissue]
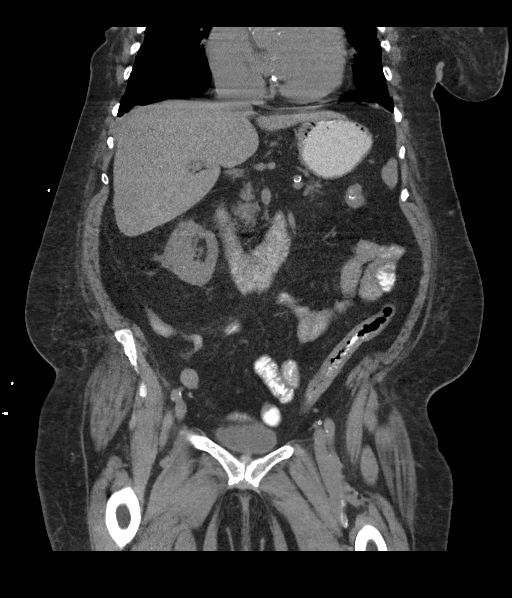
[im 66/119  soft-tissue]
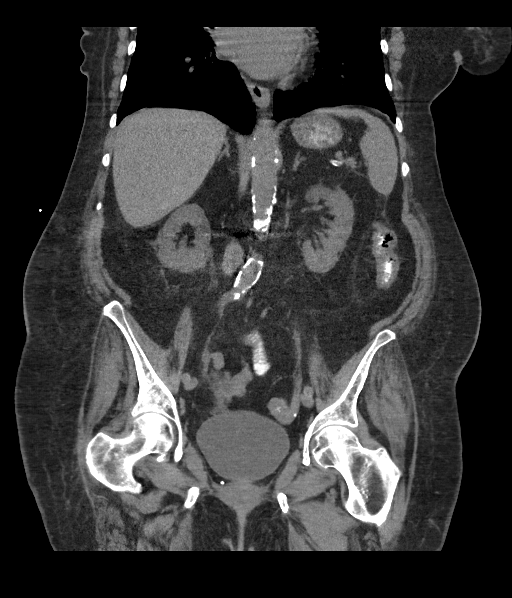

[16 of 46 positions shown; findings below may reference images not displayed]

FINDINGS: Lower chest: No pulmonary nodules, pleural effusions, or
infiltrates. The heart is enlarged. There is calcification of
coronary vessels and mitral annulus. No pericardial effusion.

Hepatobiliary: Status post cholecystectomy. Homogeneous appearance
of the liver. No focal liver lesion.

Pancreas: Unremarkable. No pancreatic ductal dilatation or
surrounding inflammatory changes.

Spleen: Normal in size without focal abnormality.

Adrenals/Urinary Tract: Adrenal glands have a normal appearance.
Punctate intrarenal calculi identified within the kidneys
bilaterally. Within the upper pole of the left kidney there is a
low-attenuation lesion consistent with cysts measuring 1.6 cm. No
hydronephrosis or ureteral stone.

Stomach/Bowel: Stomach and small bowel loops are normal in
appearance. Status post appendectomy. There are numerous
diverticulum but no evidence for acute diverticulitis. There is
diffuse thickening of the sigmoid colon. Mild thickening of the
transverse, ascending, and descending colon as well. No discrete
colonic mass identified.

Vascular/Lymphatic: There is atherosclerotic calcification of the
abdominal aorta. No aneurysm. No retroperitoneal or mesenteric
adenopathy.

Reproductive: Status post hysterectomy.  No adnexal mass.

Other: Small paraumbilical fat containing hernia or hernias.
Postoperative changes in the lower anterior abdominal wall. No free
pelvic fluid. Right mastectomy.

Musculoskeletal: Status post posterior fusion of the lumbar spine.
Significant spondylosis of the lumbar levels.
IMPRESSION: 1. Diffuse thickening of the colonic wall, particularly involving
the sigmoid colon. Findings favor colitis, infectious or
inflammatory. Although there is colonic diverticulosis, the findings
are not consistent with acute diverticulitis.
2. Cardiomegaly and coronary artery disease.
3. Status post cholecystectomy and appendectomy, hysterectomy.
4. Nephrolithiasis.
5. Left upper pole renal cyst.
6.  Aortic atherosclerosis.
7. Small paraumbilical fat containing hernia/hernia
8. Postoperative changes in the lumbar spine.
9. Right mastectomy.

## 2018-07-17 DIAGNOSIS — I10 Essential (primary) hypertension: Secondary | ICD-10-CM | POA: Diagnosis not present

## 2018-07-17 DIAGNOSIS — E1165 Type 2 diabetes mellitus with hyperglycemia: Secondary | ICD-10-CM | POA: Diagnosis not present

## 2018-07-17 DIAGNOSIS — E785 Hyperlipidemia, unspecified: Secondary | ICD-10-CM | POA: Diagnosis not present

## 2018-07-17 DIAGNOSIS — Z853 Personal history of malignant neoplasm of breast: Secondary | ICD-10-CM | POA: Diagnosis not present

## 2018-07-17 DIAGNOSIS — Z794 Long term (current) use of insulin: Secondary | ICD-10-CM | POA: Diagnosis not present

## 2018-07-17 DIAGNOSIS — L8992 Pressure ulcer of unspecified site, stage 2: Secondary | ICD-10-CM | POA: Diagnosis not present

## 2018-07-17 DIAGNOSIS — E669 Obesity, unspecified: Secondary | ICD-10-CM | POA: Diagnosis not present

## 2018-07-17 DIAGNOSIS — Z9011 Acquired absence of right breast and nipple: Secondary | ICD-10-CM | POA: Diagnosis not present

## 2018-07-22 DIAGNOSIS — Z794 Long term (current) use of insulin: Secondary | ICD-10-CM | POA: Diagnosis not present

## 2018-07-22 DIAGNOSIS — I10 Essential (primary) hypertension: Secondary | ICD-10-CM | POA: Diagnosis not present

## 2018-07-22 DIAGNOSIS — Z9011 Acquired absence of right breast and nipple: Secondary | ICD-10-CM | POA: Diagnosis not present

## 2018-07-22 DIAGNOSIS — L8992 Pressure ulcer of unspecified site, stage 2: Secondary | ICD-10-CM | POA: Diagnosis not present

## 2018-07-22 DIAGNOSIS — E1165 Type 2 diabetes mellitus with hyperglycemia: Secondary | ICD-10-CM | POA: Diagnosis not present

## 2018-07-22 DIAGNOSIS — Z853 Personal history of malignant neoplasm of breast: Secondary | ICD-10-CM | POA: Diagnosis not present

## 2018-07-22 DIAGNOSIS — E785 Hyperlipidemia, unspecified: Secondary | ICD-10-CM | POA: Diagnosis not present

## 2018-07-22 DIAGNOSIS — E669 Obesity, unspecified: Secondary | ICD-10-CM | POA: Diagnosis not present

## 2018-07-23 DIAGNOSIS — Z794 Long term (current) use of insulin: Secondary | ICD-10-CM | POA: Diagnosis not present

## 2018-07-23 DIAGNOSIS — I10 Essential (primary) hypertension: Secondary | ICD-10-CM | POA: Diagnosis not present

## 2018-07-23 DIAGNOSIS — E1165 Type 2 diabetes mellitus with hyperglycemia: Secondary | ICD-10-CM | POA: Diagnosis not present

## 2018-07-23 DIAGNOSIS — Z9011 Acquired absence of right breast and nipple: Secondary | ICD-10-CM | POA: Diagnosis not present

## 2018-07-23 DIAGNOSIS — E669 Obesity, unspecified: Secondary | ICD-10-CM | POA: Diagnosis not present

## 2018-07-23 DIAGNOSIS — L8992 Pressure ulcer of unspecified site, stage 2: Secondary | ICD-10-CM | POA: Diagnosis not present

## 2018-07-23 DIAGNOSIS — E785 Hyperlipidemia, unspecified: Secondary | ICD-10-CM | POA: Diagnosis not present

## 2018-07-23 DIAGNOSIS — Z853 Personal history of malignant neoplasm of breast: Secondary | ICD-10-CM | POA: Diagnosis not present

## 2018-07-24 DIAGNOSIS — E785 Hyperlipidemia, unspecified: Secondary | ICD-10-CM | POA: Diagnosis not present

## 2018-07-24 DIAGNOSIS — L8992 Pressure ulcer of unspecified site, stage 2: Secondary | ICD-10-CM | POA: Diagnosis not present

## 2018-07-24 DIAGNOSIS — E669 Obesity, unspecified: Secondary | ICD-10-CM | POA: Diagnosis not present

## 2018-07-24 DIAGNOSIS — Z853 Personal history of malignant neoplasm of breast: Secondary | ICD-10-CM | POA: Diagnosis not present

## 2018-07-24 DIAGNOSIS — E1165 Type 2 diabetes mellitus with hyperglycemia: Secondary | ICD-10-CM | POA: Diagnosis not present

## 2018-07-24 DIAGNOSIS — I10 Essential (primary) hypertension: Secondary | ICD-10-CM | POA: Diagnosis not present

## 2018-07-24 DIAGNOSIS — Z9011 Acquired absence of right breast and nipple: Secondary | ICD-10-CM | POA: Diagnosis not present

## 2018-07-24 DIAGNOSIS — Z794 Long term (current) use of insulin: Secondary | ICD-10-CM | POA: Diagnosis not present

## 2018-07-31 DIAGNOSIS — G8929 Other chronic pain: Secondary | ICD-10-CM | POA: Diagnosis not present

## 2018-07-31 DIAGNOSIS — Z681 Body mass index (BMI) 19 or less, adult: Secondary | ICD-10-CM | POA: Diagnosis not present

## 2018-07-31 DIAGNOSIS — Z1389 Encounter for screening for other disorder: Secondary | ICD-10-CM | POA: Diagnosis not present

## 2018-08-06 DIAGNOSIS — Z9011 Acquired absence of right breast and nipple: Secondary | ICD-10-CM | POA: Diagnosis not present

## 2018-08-06 DIAGNOSIS — Z853 Personal history of malignant neoplasm of breast: Secondary | ICD-10-CM | POA: Diagnosis not present

## 2018-08-06 DIAGNOSIS — I10 Essential (primary) hypertension: Secondary | ICD-10-CM | POA: Diagnosis not present

## 2018-08-06 DIAGNOSIS — E785 Hyperlipidemia, unspecified: Secondary | ICD-10-CM | POA: Diagnosis not present

## 2018-08-06 DIAGNOSIS — Z794 Long term (current) use of insulin: Secondary | ICD-10-CM | POA: Diagnosis not present

## 2018-08-06 DIAGNOSIS — E669 Obesity, unspecified: Secondary | ICD-10-CM | POA: Diagnosis not present

## 2018-08-06 DIAGNOSIS — E1165 Type 2 diabetes mellitus with hyperglycemia: Secondary | ICD-10-CM | POA: Diagnosis not present

## 2018-08-06 DIAGNOSIS — L8992 Pressure ulcer of unspecified site, stage 2: Secondary | ICD-10-CM | POA: Diagnosis not present

## 2018-08-14 DIAGNOSIS — Z794 Long term (current) use of insulin: Secondary | ICD-10-CM | POA: Diagnosis not present

## 2018-08-14 DIAGNOSIS — Z853 Personal history of malignant neoplasm of breast: Secondary | ICD-10-CM | POA: Diagnosis not present

## 2018-08-14 DIAGNOSIS — I10 Essential (primary) hypertension: Secondary | ICD-10-CM | POA: Diagnosis not present

## 2018-08-14 DIAGNOSIS — L8992 Pressure ulcer of unspecified site, stage 2: Secondary | ICD-10-CM | POA: Diagnosis not present

## 2018-08-14 DIAGNOSIS — E785 Hyperlipidemia, unspecified: Secondary | ICD-10-CM | POA: Diagnosis not present

## 2018-08-14 DIAGNOSIS — Z9011 Acquired absence of right breast and nipple: Secondary | ICD-10-CM | POA: Diagnosis not present

## 2018-08-14 DIAGNOSIS — E1165 Type 2 diabetes mellitus with hyperglycemia: Secondary | ICD-10-CM | POA: Diagnosis not present

## 2018-08-14 DIAGNOSIS — E669 Obesity, unspecified: Secondary | ICD-10-CM | POA: Diagnosis not present

## 2018-08-19 DIAGNOSIS — E875 Hyperkalemia: Secondary | ICD-10-CM | POA: Diagnosis not present

## 2018-08-19 DIAGNOSIS — E669 Obesity, unspecified: Secondary | ICD-10-CM | POA: Diagnosis not present

## 2018-08-19 DIAGNOSIS — R809 Proteinuria, unspecified: Secondary | ICD-10-CM | POA: Diagnosis not present

## 2018-08-19 DIAGNOSIS — N184 Chronic kidney disease, stage 4 (severe): Secondary | ICD-10-CM | POA: Diagnosis not present

## 2018-08-19 DIAGNOSIS — Z23 Encounter for immunization: Secondary | ICD-10-CM | POA: Diagnosis not present

## 2018-08-19 DIAGNOSIS — D631 Anemia in chronic kidney disease: Secondary | ICD-10-CM | POA: Diagnosis not present

## 2018-08-19 DIAGNOSIS — N2581 Secondary hyperparathyroidism of renal origin: Secondary | ICD-10-CM | POA: Diagnosis not present

## 2018-08-19 DIAGNOSIS — I129 Hypertensive chronic kidney disease with stage 1 through stage 4 chronic kidney disease, or unspecified chronic kidney disease: Secondary | ICD-10-CM | POA: Diagnosis not present

## 2018-08-19 DIAGNOSIS — E1122 Type 2 diabetes mellitus with diabetic chronic kidney disease: Secondary | ICD-10-CM | POA: Diagnosis not present

## 2018-08-21 DIAGNOSIS — E1165 Type 2 diabetes mellitus with hyperglycemia: Secondary | ICD-10-CM | POA: Diagnosis not present

## 2018-08-21 DIAGNOSIS — I10 Essential (primary) hypertension: Secondary | ICD-10-CM | POA: Diagnosis not present

## 2018-08-21 DIAGNOSIS — E669 Obesity, unspecified: Secondary | ICD-10-CM | POA: Diagnosis not present

## 2018-08-21 DIAGNOSIS — Z853 Personal history of malignant neoplasm of breast: Secondary | ICD-10-CM | POA: Diagnosis not present

## 2018-08-21 DIAGNOSIS — E785 Hyperlipidemia, unspecified: Secondary | ICD-10-CM | POA: Diagnosis not present

## 2018-08-21 DIAGNOSIS — Z794 Long term (current) use of insulin: Secondary | ICD-10-CM | POA: Diagnosis not present

## 2018-08-21 DIAGNOSIS — L8992 Pressure ulcer of unspecified site, stage 2: Secondary | ICD-10-CM | POA: Diagnosis not present

## 2018-08-21 DIAGNOSIS — Z9011 Acquired absence of right breast and nipple: Secondary | ICD-10-CM | POA: Diagnosis not present

## 2018-08-28 DIAGNOSIS — L8992 Pressure ulcer of unspecified site, stage 2: Secondary | ICD-10-CM | POA: Diagnosis not present

## 2018-08-28 DIAGNOSIS — E785 Hyperlipidemia, unspecified: Secondary | ICD-10-CM | POA: Diagnosis not present

## 2018-08-28 DIAGNOSIS — E669 Obesity, unspecified: Secondary | ICD-10-CM | POA: Diagnosis not present

## 2018-08-28 DIAGNOSIS — Z853 Personal history of malignant neoplasm of breast: Secondary | ICD-10-CM | POA: Diagnosis not present

## 2018-08-28 DIAGNOSIS — Z9011 Acquired absence of right breast and nipple: Secondary | ICD-10-CM | POA: Diagnosis not present

## 2018-08-28 DIAGNOSIS — Z794 Long term (current) use of insulin: Secondary | ICD-10-CM | POA: Diagnosis not present

## 2018-08-28 DIAGNOSIS — I10 Essential (primary) hypertension: Secondary | ICD-10-CM | POA: Diagnosis not present

## 2018-08-28 DIAGNOSIS — E1165 Type 2 diabetes mellitus with hyperglycemia: Secondary | ICD-10-CM | POA: Diagnosis not present

## 2018-09-05 ENCOUNTER — Emergency Department (HOSPITAL_COMMUNITY)
Admission: EM | Admit: 2018-09-05 | Discharge: 2018-09-05 | Disposition: A | Discharge status: Home / Self Care | Payer: Medicare HMO | Attending: Emergency Medicine | Admitting: Emergency Medicine

## 2018-09-05 ENCOUNTER — Other Ambulatory Visit: Payer: Self-pay

## 2018-09-05 ENCOUNTER — Encounter (HOSPITAL_COMMUNITY): Payer: Self-pay | Admitting: Emergency Medicine

## 2018-09-05 DIAGNOSIS — Z794 Long term (current) use of insulin: Secondary | ICD-10-CM | POA: Diagnosis not present

## 2018-09-05 DIAGNOSIS — Z9011 Acquired absence of right breast and nipple: Secondary | ICD-10-CM | POA: Diagnosis not present

## 2018-09-05 DIAGNOSIS — N184 Chronic kidney disease, stage 4 (severe): Secondary | ICD-10-CM | POA: Diagnosis not present

## 2018-09-05 DIAGNOSIS — Z87891 Personal history of nicotine dependence: Secondary | ICD-10-CM | POA: Insufficient documentation

## 2018-09-05 DIAGNOSIS — Z79899 Other long term (current) drug therapy: Secondary | ICD-10-CM | POA: Diagnosis not present

## 2018-09-05 DIAGNOSIS — E669 Obesity, unspecified: Secondary | ICD-10-CM | POA: Diagnosis not present

## 2018-09-05 DIAGNOSIS — L8992 Pressure ulcer of unspecified site, stage 2: Secondary | ICD-10-CM | POA: Diagnosis not present

## 2018-09-05 DIAGNOSIS — Z853 Personal history of malignant neoplasm of breast: Secondary | ICD-10-CM | POA: Diagnosis not present

## 2018-09-05 DIAGNOSIS — R197 Diarrhea, unspecified: Secondary | ICD-10-CM | POA: Diagnosis not present

## 2018-09-05 DIAGNOSIS — E1165 Type 2 diabetes mellitus with hyperglycemia: Secondary | ICD-10-CM | POA: Insufficient documentation

## 2018-09-05 DIAGNOSIS — E785 Hyperlipidemia, unspecified: Secondary | ICD-10-CM | POA: Diagnosis not present

## 2018-09-05 DIAGNOSIS — Z7982 Long term (current) use of aspirin: Secondary | ICD-10-CM | POA: Diagnosis not present

## 2018-09-05 DIAGNOSIS — I10 Essential (primary) hypertension: Secondary | ICD-10-CM | POA: Diagnosis not present

## 2018-09-05 DIAGNOSIS — R739 Hyperglycemia, unspecified: Secondary | ICD-10-CM

## 2018-09-05 DIAGNOSIS — I129 Hypertensive chronic kidney disease with stage 1 through stage 4 chronic kidney disease, or unspecified chronic kidney disease: Secondary | ICD-10-CM | POA: Insufficient documentation

## 2018-09-05 LAB — I-STAT CHEM 8, ED
BUN: 26 mg/dL — ABNORMAL HIGH (ref 8–23)
CHLORIDE: 108 mmol/L (ref 98–111)
Calcium, Ion: 1.29 mmol/L (ref 1.15–1.40)
Creatinine, Ser: 1.6 mg/dL — ABNORMAL HIGH (ref 0.44–1.00)
GLUCOSE: 317 mg/dL — AB (ref 70–99)
HCT: 27 % — ABNORMAL LOW (ref 36.0–46.0)
Hemoglobin: 9.2 g/dL — ABNORMAL LOW (ref 12.0–15.0)
POTASSIUM: 4.3 mmol/L (ref 3.5–5.1)
Sodium: 141 mmol/L (ref 135–145)
TCO2: 23 mmol/L (ref 22–32)

## 2018-09-05 LAB — CBG MONITORING, ED: GLUCOSE-CAPILLARY: 305 mg/dL — AB (ref 70–99)

## 2018-09-05 MED ORDER — HEPARIN SOD (PORK) LOCK FLUSH 100 UNIT/ML IV SOLN
INTRAVENOUS | Status: AC
  Administered 2018-09-05: 500 [IU]
  Filled 2018-09-05: qty 5

## 2018-09-05 MED ORDER — SODIUM CHLORIDE 0.9 % IV BOLUS (SEPSIS)
1000.0000 mL | Freq: Once | INTRAVENOUS | Status: AC
  Administered 2018-09-05: 1000 mL via INTRAVENOUS

## 2018-09-05 NOTE — Discharge Instructions (Addendum)
Drink plenty of fluids.  Start back taking your  diabetes medicine and eating meals

## 2018-09-05 NOTE — ED Triage Notes (Signed)
Seen by [facility] nurse today for ducubita and glucose 424 and BP elevated.  EMS glucose 299.  Had diarrhea for last 2 days.  Pt called Dr Lorie Apley office and was ordered OTC meds but pt was not able to pick up medication.  Had not taken insulin in 2 days due to not eating well and diarrhea.  Glucose 305 in ED.  Denies any pain at this time.

## 2018-09-05 NOTE — ED Provider Notes (Signed)
[facility] EMERGENCY DEPARTMENT Provider Note   CSN: [id number] Arrival date & time: 09/05/18  1402     History   Chief Complaint Chief Complaint  Patient presents with  . Hyperglycemia    HPI [name] is a 84 y.o. female.  Patient has had diarrhea for couple days but none recently.  Patient was seen by her home health nurse and her blood sugar was moderately elevated so she sent her to the emergency department.  Patient has not been taking her diabetes medicine since she became ill with the diarrhea  The history is provided by the patient. No language interpreter was used.  Illness  This is a new problem. The current episode started 2 days ago. The problem occurs constantly. The problem has been resolved. Pertinent negatives include no chest pain, no abdominal pain and no headaches. Nothing aggravates the symptoms. Nothing relieves the symptoms. She has tried nothing for the symptoms. The treatment provided no relief.    Past Medical History:  Diagnosis Date  . Anemia   . Aortic stenosis    Mild  . Arthritis   . Asthma   . Atrial flutter ([place]) 2002  . B12 deficiency 06/20/2015  . B12 deficiency 06/20/2015  . Breast carcinoma ([place])   . Cerebrovascular disease    with a 07% LICA; repeat study in 10/2009-no obstructive disease; modest ASVD  . Chronic kidney disease    Creatinine-1.5 in 2010 and 2.5-3 in 2011; 1.5-10/2010;  Klebsiella UTI-10/2010. urine protein 36 mg/dl, mildly elevated  . Decreased bone density   . Depression   . Diabetes mellitus   . Diabetic retinopathy   . DJD (degenerative joint disease) 11/14/2011  . Falls infrequently 01/2010   fracture of pelvis and left humerus  . Fibromyalgia   . Headaches, cluster   . Hyperlipidemia   . Hypertension   . IBS (irritable bowel syndrome)    diverticulosis, gastroesophageal reflux disease  . Lower back pain   . Obesity 11/14/2011  . Spinal stenosis     Patient Active Problem List   Diagnosis Date Noted   . Hypoglycemia 02/16/2017  . Acute metabolic encephalopathy 37/09/6268  . Pressure injury of skin 02/16/2017  . Acute kidney injury superimposed on chronic kidney disease ([place]) 11/21/2016  . Hyperglycemia 11/21/2016  . Acute cystitis without hematuria 11/21/2016  . Diarrhea of presumed infectious origin 11/21/2016  . UTI (urinary tract infection) 11/10/2016  . Colitis 11/10/2016  . Elevated troponin I level 11/10/2016  . B12 deficiency 06/20/2015  . UTI (lower urinary tract infection) 05/21/2014  . DJD (degenerative joint disease) 11/14/2011  . Obesity 11/14/2011  . Inflammatory carcinoma of right breast   . Anemia   . IBS (irritable bowel syndrome)   . Essential hypertension   . Aortic stenosis   . Chronic kidney disease   . Fibromyalgia   . Cerebrovascular disease   . Atrial fibrillation ([place]) 07/14/2010  . Falls infrequently 01/31/2010  . HYPERLIPIDEMIA 11/15/2009  . Type 2 DM with CKD stage 4 and hypertension ([place]) 10/12/2009  . DEPRESSION/ANXIETY 10/12/2009  . SPINAL STENOSIS, LUMBAR 10/12/2009    Past Surgical History:  Procedure Laterality Date  . APPENDECTOMY    . CATARACT EXTRACTION, BILATERAL     Lens implants  . CENTRAL VENOUS CATHETER TUNNELED INSERTION SINGLE LUMEN    . CHOLECYSTECTOMY    . COLONOSCOPY  2012  . EYE SURGERY  nov 2012   for diabetic retinopathy  . LUMBAR DISC SURGERY  lumbosacral spine procedure x 2  . MASTECTOMY     Right breast for carcinoma     OB History   None      Home Medications    Prior to Admission medications   Medication Sig Start Date End Date Taking? Authorizing Provider  albuterol (PROAIR HFA) 108 (90 BASE) MCG/ACT inhaler Inhale 2 puffs into the lungs every 6 (six) hours as needed for wheezing or shortness of breath. 06/16/14   Baird Cancer, PA-C  aspirin EC 81 MG tablet Take 81 mg by mouth daily.    [provider]  cyanocobalamin (,VITAMIN B-12,) 1000 MCG/ML injection Inject 1,000 mcg into the  muscle every 30 (thirty) days.    [provider]  Diclofenac Sodium 3 % GEL Apply 1 application topically 3 (three) times daily. 01/30/17   [provider]  esomeprazole (NEXIUM) 40 MG capsule Take 40 mg by mouth daily.     [provider]  fenofibrate (TRICOR) 145 MG tablet Take 145 mg by mouth daily.    [provider]  fluticasone (FLONASE) 50 MCG/ACT nasal spray Place 1 spray into both nostrils daily. 01/28/17   Baird Cancer, PA-C  furosemide (LASIX) 40 MG tablet Take 1 tablet (40 mg total) by mouth daily. 11/23/16   Thurnell Lose, MD  HYDROcodone-acetaminophen (NORCO) 10-325 MG per tablet Take 1 tablet by mouth 2 (two) times daily as needed for moderate pain.     [provider]  insulin aspart (NOVOLOG FLEXPEN) 100 UNIT/ML FlexPen Inject 8 Units into the skin 3 (three) times daily with meals. 02/17/17   Johnson, Clanford L, MD  Insulin Degludec (TRESIBA FLEXTOUCH) 200 UNIT/ML SOPN Inject 12 Units into the skin at bedtime. 02/17/17   Johnson, Clanford L, MD  lisinopril (PRINIVIL,ZESTRIL) 20 MG tablet Take 20 mg by mouth daily.    [provider]  metoprolol tartrate (LOPRESSOR) 50 MG tablet Take 50 mg by mouth daily.    [provider]  PARoxetine (PAXIL) 40 MG tablet Take 40 mg by mouth daily.     [provider]  pregabalin (LYRICA) 50 MG capsule Take 50 mg by mouth 3 (three) times daily.     [provider]  raloxifene (EVISTA) 60 MG tablet Take 60 mg by mouth daily.    [provider]  rosuvastatin (CRESTOR) 40 MG tablet Take 1 tablet (40 mg total) by mouth at bedtime. 07/20/15   Herminio Commons, MD  traMADol (ULTRAM) 50 MG tablet Take 50-100 mg by mouth 4 (four) times daily as needed.    [provider]    Family History Family History  Problem Relation Age of Onset  . Alzheimer's disease Mother   . Heart attack Father   . Aneurysm Sister   . Coronary artery disease Brother       Social History Social History   Tobacco Use  . Smoking status: Former Smoker    Packs/day: 1.00    Years: 30.00    Pack years: 30.00    Types: Cigarettes    Last attempt to quit: 1982    Years since quitting: 37.7  . Smokeless tobacco: Never Used  Substance Use Topics  . Alcohol use: No  . Drug use: No     Allergies   Mold extract [trichophyton mentagrophyte]; Morphine and related; Niaspan [niacin]; and Pollen extract   Review of Systems Review of Systems  Constitutional: Negative for appetite change and fatigue.  HENT: Negative for congestion, ear discharge  and sinus pressure.   Eyes: Negative for discharge.  Respiratory: Negative for cough.   Cardiovascular: Negative for chest pain.  Gastrointestinal: Positive for diarrhea. Negative for abdominal pain.  Genitourinary: Negative for frequency and hematuria.  Musculoskeletal: Negative for back pain.  Skin: Negative for rash.  Neurological: Negative for seizures and headaches.  Psychiatric/Behavioral: Negative for hallucinations.     Physical Exam Updated Vital Signs Ht $Remove'5\' 11"'gVXSLWh$  (1.803 m)   Wt 99.8 kg   BMI 30.68 kg/m   Physical Exam  Constitutional: She is oriented to person, place, and time. She appears well-developed.  HENT:  Head: Normocephalic.  Dry mucous membranes  Eyes: Conjunctivae and EOM are normal. No scleral icterus.  Neck: Neck supple. No thyromegaly present.  Cardiovascular: Normal rate and regular rhythm. Exam reveals no gallop and no friction rub.  No murmur heard. Pulmonary/Chest: No stridor. She has no wheezes. She has no rales. She exhibits no tenderness.  Abdominal: She exhibits no distension. There is no tenderness. There is no rebound.  Musculoskeletal: Normal range of motion. She exhibits no edema.  Lymphadenopathy:    She has no cervical adenopathy.  Neurological: She is oriented to person, place, and time. She exhibits normal muscle tone. Coordination normal.  Skin: No rash  noted. No erythema.  Psychiatric: She has a normal mood and affect. Her behavior is normal.     ED Treatments / Results  Labs (all labs ordered are listed, but only abnormal results are displayed) Labs Reviewed  CBG MONITORING, ED - Abnormal; Notable for the following components:      Result Value   Glucose-Capillary 305 (*)    All other components within normal limits  I-STAT CHEM 8, ED - Abnormal; Notable for the following components:   BUN 26 (*)    Creatinine, Ser 1.60 (*)    Glucose, Bld 317 (*)    Hemoglobin 9.2 (*)    HCT 27.0 (*)    All other components within normal limits    EKG None  Radiology No results found.  Procedures Procedures (including critical care time)  Medications Ordered in ED Medications  sodium chloride 0.9 % bolus 1,000 mL (1,000 mLs Intravenous New Bag/Given 09/05/18 1442)     Initial Impression / Assessment and Plan / ED Course  I have reviewed the triage vital signs and the nursing notes.  Pertinent labs & imaging results that were available during my care of the patient were reviewed by me and considered in my medical decision making (see chart for details).     She with mild to moderate dehydration secondary to diarrhea.  Also patient with mild to moderately elevated glucose.  Patient was given 1 L of IV fluids and was told to start taking her diabetic medicine at home and start eating meals.  She had no diarrhea while she was in the emergency department  Final Clinical Impressions(s) / ED Diagnoses   Final diagnoses:  Hyperglycemia    ED Discharge Orders    None       Milton Ferguson, MD 09/05/18 1539

## 2018-09-11 DIAGNOSIS — I10 Essential (primary) hypertension: Secondary | ICD-10-CM | POA: Diagnosis not present

## 2018-09-11 DIAGNOSIS — E1165 Type 2 diabetes mellitus with hyperglycemia: Secondary | ICD-10-CM | POA: Diagnosis not present

## 2018-09-11 DIAGNOSIS — Z9011 Acquired absence of right breast and nipple: Secondary | ICD-10-CM | POA: Diagnosis not present

## 2018-09-11 DIAGNOSIS — Z853 Personal history of malignant neoplasm of breast: Secondary | ICD-10-CM | POA: Diagnosis not present

## 2018-09-11 DIAGNOSIS — Z794 Long term (current) use of insulin: Secondary | ICD-10-CM | POA: Diagnosis not present

## 2018-09-11 DIAGNOSIS — E669 Obesity, unspecified: Secondary | ICD-10-CM | POA: Diagnosis not present

## 2018-09-11 DIAGNOSIS — L8992 Pressure ulcer of unspecified site, stage 2: Secondary | ICD-10-CM | POA: Diagnosis not present

## 2018-09-11 DIAGNOSIS — E785 Hyperlipidemia, unspecified: Secondary | ICD-10-CM | POA: Diagnosis not present

## 2018-09-15 ENCOUNTER — Other Ambulatory Visit: Payer: Self-pay

## 2018-09-15 ENCOUNTER — Encounter: Payer: Self-pay | Admitting: Podiatry

## 2018-09-15 ENCOUNTER — Ambulatory Visit: Payer: Medicare HMO | Admitting: Podiatry

## 2018-09-15 VITALS — BP 134/81 | HR 93

## 2018-09-15 DIAGNOSIS — B351 Tinea unguium: Secondary | ICD-10-CM

## 2018-09-15 DIAGNOSIS — M6281 Muscle weakness (generalized): Secondary | ICD-10-CM | POA: Diagnosis not present

## 2018-09-15 DIAGNOSIS — R2681 Unsteadiness on feet: Secondary | ICD-10-CM | POA: Diagnosis not present

## 2018-09-15 DIAGNOSIS — E119 Type 2 diabetes mellitus without complications: Secondary | ICD-10-CM

## 2018-09-15 DIAGNOSIS — E1142 Type 2 diabetes mellitus with diabetic polyneuropathy: Secondary | ICD-10-CM | POA: Diagnosis not present

## 2018-09-15 DIAGNOSIS — E139 Other specified diabetes mellitus without complications: Secondary | ICD-10-CM

## 2018-09-15 DIAGNOSIS — R260 Ataxic gait: Secondary | ICD-10-CM | POA: Diagnosis not present

## 2018-09-15 DIAGNOSIS — Z9181 History of falling: Secondary | ICD-10-CM | POA: Diagnosis not present

## 2018-09-15 NOTE — Progress Notes (Signed)
Subjective:  Patient ID: [name], female    DOB: [birth year],  MRN: [id number]  Chief Complaint  Patient presents with  . Nail Problem    trim  . Diabetes    B/ l feet exam , left great toe has a sore that has been there for 1 year, scabbing small contusion over it    84 y.o. female presents  for diabetic foot care. Last AMBS was 244. Reports concern of sore on the inside of the left great toe. Ambulates with walker, endorses concern of falling. Reports numbness and tingling in their feet. Denies cramping in legs and thighs.  Review of Systems: Negative except as noted in the HPI. Denies N/V/F/Ch.  Past Medical History:  Diagnosis Date  . Anemia   . Aortic stenosis    Mild  . Arthritis   . Asthma   . Atrial flutter ([place]) 2002  . B12 deficiency 06/20/2015  . B12 deficiency 06/20/2015  . Breast carcinoma ([place])   . Cerebrovascular disease    with a 44% LICA; repeat study in 10/2009-no obstructive disease; modest ASVD  . Chronic kidney disease    Creatinine-1.5 in 2010 and 2.5-3 in 2011; 1.5-10/2010;  Klebsiella UTI-10/2010. urine protein 36 mg/dl, mildly elevated  . Decreased bone density   . Depression   . Diabetes mellitus   . Diabetic retinopathy   . DJD (degenerative joint disease) 11/14/2011  . Falls infrequently 01/2010   fracture of pelvis and left humerus  . Fibromyalgia   . Headaches, cluster   . Hyperlipidemia   . Hypertension   . IBS (irritable bowel syndrome)    diverticulosis, gastroesophageal reflux disease  . Lower back pain   . Obesity 11/14/2011  . Spinal stenosis     Current Outpatient Medications:  .  albuterol (PROAIR HFA) 108 (90 BASE) MCG/ACT inhaler, Inhale 2 puffs into the lungs every 6 (six) hours as needed for wheezing or shortness of breath., Disp: 1 Inhaler, Rfl: 3 .  aspirin EC 81 MG tablet, Take 81 mg by mouth daily., Disp: , Rfl:  .  clonazePAM (KLONOPIN) 0.5 MG tablet, , Disp: , Rfl:  .  cyanocobalamin (,VITAMIN B-12,) 1000 MCG/ML  injection, Inject 1,000 mcg into the muscle every 30 (thirty) days., Disp: , Rfl:  .  Diclofenac Sodium 3 % GEL, Apply 1 application topically 3 (three) times daily., Disp: , Rfl:  .  esomeprazole (NEXIUM) 40 MG capsule, Take 40 mg by mouth daily. , Disp: , Rfl:  .  fenofibrate (TRICOR) 145 MG tablet, Take 145 mg by mouth daily., Disp: , Rfl:  .  fluticasone (FLONASE) 50 MCG/ACT nasal spray, Place 1 spray into both nostrils daily., Disp: 16 g, Rfl: 2 .  furosemide (LASIX) 40 MG tablet, Take 1 tablet (40 mg total) by mouth daily., Disp: 30 tablet, Rfl:  .  glipiZIDE (GLUCOTROL) 10 MG tablet, Take by mouth., Disp: , Rfl:  .  HYDROcodone-acetaminophen (NORCO) 10-325 MG per tablet, Take 1 tablet by mouth 2 (two) times daily as needed for moderate pain. , Disp: , Rfl:  .  insulin aspart (NOVOLOG FLEXPEN) 100 UNIT/ML FlexPen, Inject 8 Units into the skin 3 (three) times daily with meals., Disp: 15 mL, Rfl: 11 .  Insulin Degludec (TRESIBA FLEXTOUCH) 200 UNIT/ML SOPN, Inject 12 Units into the skin at bedtime., Disp: , Rfl:  .  lisinopril (PRINIVIL,ZESTRIL) 20 MG tablet, Take 20 mg by mouth daily., Disp: , Rfl:  .  loperamide (IMODIUM) 2 MG capsule, , Disp: ,  Rfl:  .  metoprolol tartrate (LOPRESSOR) 50 MG tablet, Take 50 mg by mouth daily., Disp: , Rfl:  .  PARoxetine (PAXIL) 40 MG tablet, Take 40 mg by mouth daily. , Disp: , Rfl:  .  pregabalin (LYRICA) 50 MG capsule, Take 50 mg by mouth 3 (three) times daily. , Disp: , Rfl:  .  raloxifene (EVISTA) 60 MG tablet, Take 60 mg by mouth daily., Disp: , Rfl:  .  rosuvastatin (CRESTOR) 40 MG tablet, Take 1 tablet (40 mg total) by mouth at bedtime., Disp: 90 tablet, Rfl: 4 .  SURE COMFORT PEN NEEDLES 31G X 8 MM MISC, , Disp: , Rfl:  .  traMADol (ULTRAM) 50 MG tablet, Take 50-100 mg by mouth 4 (four) times daily as needed., Disp: , Rfl:  .  glipiZIDE (GLUCOTROL) 10 MG tablet, , Disp: , Rfl:  No current facility-administered medications for this visit.    Facility-Administered Medications Ordered in Other Visits:  .  sodium chloride 0.9 % injection 10 mL, 10 mL, Intravenous, PRN, Baird Cancer, PA-C, 10 mL at 10/20/15 1205  Social History   Tobacco Use  Smoking Status Former Smoker  . Packs/day: 1.00  . Years: 30.00  . Pack years: 30.00  . Types: Cigarettes  . Last attempt to quit: 1982  . Years since quitting: 37.8  Smokeless Tobacco Never Used    Allergies  Allergen Reactions  . Mold Extract [Trichophyton Mentagrophyte] Other (See Comments)    Reaction:  Itchy, watery eyes/sneezing   . Morphine And Related Other (See Comments)    Reaction:  Drowsiness. "I didn't wake up for almost 1 week" after taking morphine.   . Niaspan [Niacin] Other (See Comments)    Reaction:  Flushing   . Pollen Extract Other (See Comments)    Reaction:  Itchy, watery eyes/sneezing    Objective:   Vitals:   09/15/18 1538  BP: 134/81  Pulse: 93   There is no height or weight on file to calculate BMI. Constitutional Well developed. Well nourished.  Vascular Dorsalis pedis pulses present 1+ bilaterally  Posterior tibial pulses present 1+ bilaterally  Pedal hair growth absent. Capillary refill normal to all digits.  No cyanosis or clubbing noted.  Neurologic Normal speech. Oriented to person, place, and time. Epicritic sensation to light touch grossly present bilaterally. Protective sensation with 5.07 monofilament  absent bilaterally.  Dermatologic Nails elongated, thickened, dystrophic. No open wounds. No skin lesions.  Orthopedic: Normal joint ROM without pain or crepitus bilaterally. Pes planus Hammertoes bilat No bony tenderness. Ataxic gait.   Balance Assessment:  Stay Indepenent Self-Questionnaire: 14 (scores >4 indicative of fall risk) Timed Up and Go: 20 seconds (>12 seconds indicative of fall risk)  30-Second Chair Stand Test: 0 (9 normal for age and gender ; below average indicative of fall risk) Full Tandem Stance:  unable to perform (<10 seconds indicative of fall risk)  Assessment:   1. Diabetes 1.5, managed as type 2 ([place])   2. Encounter for diabetic foot exam ([place])   3. DM type 2 with diabetic peripheral neuropathy ([place])   4. Onychomycosis   5. At risk for falls   6. Ataxic gait   7. Unsteadiness on feet   8. Muscle weakness (generalized)    Plan:  Patient was evaluated and treated and all questions answered.  Diabetes with DPN, Onychomycosis -Educated on diabetic footcare. Diabetic risk level 1 -Nails x10 debrided sharply and manually with large nail nipper and rotary burr.  -Will benefit  from DM shoes.  Procedure: Nail Debridement Rationale: Patient meets criteria for routine foot care due to DPN Type of Debridement: manual, sharp debridement. Instrumentation: Nail nipper, rotary burr. Number of Nails: 10   Fall Risk -Comprehensive fall risk assessment performed as above. -Would benefit from balance braces. Will make appointment for fabrication.  Return in about 3 months (around 12/16/2018) for Diabetic Foot Care.

## 2018-09-18 ENCOUNTER — Ambulatory Visit (HOSPITAL_COMMUNITY): Payer: Self-pay

## 2018-10-08 ENCOUNTER — Other Ambulatory Visit: Payer: Self-pay | Admitting: Orthotics

## 2018-10-20 ENCOUNTER — Ambulatory Visit: Payer: Medicare HMO | Admitting: Orthotics

## 2018-10-20 DIAGNOSIS — E1142 Type 2 diabetes mellitus with diabetic polyneuropathy: Secondary | ICD-10-CM

## 2018-10-20 DIAGNOSIS — M2040 Other hammer toe(s) (acquired), unspecified foot: Secondary | ICD-10-CM

## 2018-10-20 DIAGNOSIS — L84 Corns and callosities: Secondary | ICD-10-CM

## 2018-10-20 DIAGNOSIS — E139 Other specified diabetes mellitus without complications: Secondary | ICD-10-CM

## 2018-10-20 NOTE — Progress Notes (Signed)
Discussed balance braces with patient, their benefit and costs (her 20 % responsibility).   She really doesn't feel she can afford her 20% as it would be approximately $350.  She choses to wait until next year; however she was measured and fitted for DBS.

## 2018-11-03 ENCOUNTER — Telehealth: Payer: Self-pay | Admitting: Podiatry

## 2018-11-03 NOTE — Telephone Encounter (Signed)
Per paperwork from Dr Nolon Rod office pt needs an appt with him for him to sign off on diabetic shoe paperwork.   Left message on home and cell number listed.

## 2018-12-01 ENCOUNTER — Encounter

## 2018-12-01 ENCOUNTER — Ambulatory Visit: Payer: Self-pay | Admitting: Neurology

## 2018-12-02 DIAGNOSIS — I10 Essential (primary) hypertension: Secondary | ICD-10-CM | POA: Diagnosis not present

## 2018-12-02 DIAGNOSIS — Z681 Body mass index (BMI) 19 or less, adult: Secondary | ICD-10-CM | POA: Diagnosis not present

## 2018-12-02 DIAGNOSIS — I872 Venous insufficiency (chronic) (peripheral): Secondary | ICD-10-CM | POA: Diagnosis not present

## 2018-12-02 DIAGNOSIS — R69 Illness, unspecified: Secondary | ICD-10-CM | POA: Diagnosis not present

## 2018-12-02 DIAGNOSIS — E782 Mixed hyperlipidemia: Secondary | ICD-10-CM | POA: Diagnosis not present

## 2018-12-02 DIAGNOSIS — Z0001 Encounter for general adult medical examination with abnormal findings: Secondary | ICD-10-CM | POA: Diagnosis not present

## 2018-12-02 DIAGNOSIS — Z1389 Encounter for screening for other disorder: Secondary | ICD-10-CM | POA: Diagnosis not present

## 2018-12-02 DIAGNOSIS — E1129 Type 2 diabetes mellitus with other diabetic kidney complication: Secondary | ICD-10-CM | POA: Diagnosis not present

## 2018-12-02 DIAGNOSIS — G894 Chronic pain syndrome: Secondary | ICD-10-CM | POA: Diagnosis not present

## 2018-12-02 DIAGNOSIS — E1140 Type 2 diabetes mellitus with diabetic neuropathy, unspecified: Secondary | ICD-10-CM | POA: Diagnosis not present

## 2018-12-02 DIAGNOSIS — M810 Age-related osteoporosis without current pathological fracture: Secondary | ICD-10-CM | POA: Diagnosis not present

## 2018-12-02 DIAGNOSIS — K219 Gastro-esophageal reflux disease without esophagitis: Secondary | ICD-10-CM | POA: Diagnosis not present

## 2018-12-18 ENCOUNTER — Ambulatory Visit (INDEPENDENT_AMBULATORY_CARE_PROVIDER_SITE_OTHER): Payer: Self-pay | Admitting: Podiatry

## 2018-12-18 DIAGNOSIS — Z5329 Procedure and treatment not carried out because of patient's decision for other reasons: Secondary | ICD-10-CM

## 2018-12-29 ENCOUNTER — Encounter: Payer: Self-pay | Admitting: Endocrinology

## 2019-01-03 ENCOUNTER — Emergency Department (HOSPITAL_COMMUNITY): Payer: Medicare HMO

## 2019-01-03 ENCOUNTER — Encounter (HOSPITAL_COMMUNITY): Payer: Self-pay | Admitting: Emergency Medicine

## 2019-01-03 ENCOUNTER — Other Ambulatory Visit: Payer: Self-pay

## 2019-01-03 ENCOUNTER — Inpatient Hospital Stay (HOSPITAL_COMMUNITY)
Admission: EM | Admit: 2019-01-03 | Discharge: 2019-01-08 | DRG: 690 | Disposition: A | Discharge status: Skilled Nursing Facility | Payer: Medicare HMO | Attending: Internal Medicine | Admitting: Internal Medicine

## 2019-01-03 DIAGNOSIS — E860 Dehydration: Secondary | ICD-10-CM | POA: Diagnosis not present

## 2019-01-03 DIAGNOSIS — Z885 Allergy status to narcotic agent status: Secondary | ICD-10-CM

## 2019-01-03 DIAGNOSIS — K589 Irritable bowel syndrome without diarrhea: Secondary | ICD-10-CM | POA: Diagnosis present

## 2019-01-03 DIAGNOSIS — D631 Anemia in chronic kidney disease: Secondary | ICD-10-CM | POA: Diagnosis not present

## 2019-01-03 DIAGNOSIS — N184 Chronic kidney disease, stage 4 (severe): Secondary | ICD-10-CM | POA: Diagnosis not present

## 2019-01-03 DIAGNOSIS — E11319 Type 2 diabetes mellitus with unspecified diabetic retinopathy without macular edema: Secondary | ICD-10-CM | POA: Diagnosis present

## 2019-01-03 DIAGNOSIS — Z7401 Bed confinement status: Secondary | ICD-10-CM | POA: Diagnosis not present

## 2019-01-03 DIAGNOSIS — Z79899 Other long term (current) drug therapy: Secondary | ICD-10-CM

## 2019-01-03 DIAGNOSIS — Z8249 Family history of ischemic heart disease and other diseases of the circulatory system: Secondary | ICD-10-CM

## 2019-01-03 DIAGNOSIS — S0990XA Unspecified injury of head, initial encounter: Secondary | ICD-10-CM | POA: Diagnosis not present

## 2019-01-03 DIAGNOSIS — Z794 Long term (current) use of insulin: Secondary | ICD-10-CM

## 2019-01-03 DIAGNOSIS — S3992XA Unspecified injury of lower back, initial encounter: Secondary | ICD-10-CM | POA: Diagnosis not present

## 2019-01-03 DIAGNOSIS — K219 Gastro-esophageal reflux disease without esophagitis: Secondary | ICD-10-CM | POA: Diagnosis present

## 2019-01-03 DIAGNOSIS — R69 Illness, unspecified: Secondary | ICD-10-CM | POA: Diagnosis not present

## 2019-01-03 DIAGNOSIS — R0902 Hypoxemia: Secondary | ICD-10-CM | POA: Diagnosis not present

## 2019-01-03 DIAGNOSIS — D649 Anemia, unspecified: Secondary | ICD-10-CM | POA: Diagnosis present

## 2019-01-03 DIAGNOSIS — Z7982 Long term (current) use of aspirin: Secondary | ICD-10-CM

## 2019-01-03 DIAGNOSIS — N390 Urinary tract infection, site not specified: Secondary | ICD-10-CM | POA: Diagnosis present

## 2019-01-03 DIAGNOSIS — W1830XA Fall on same level, unspecified, initial encounter: Secondary | ICD-10-CM | POA: Diagnosis present

## 2019-01-03 DIAGNOSIS — I480 Paroxysmal atrial fibrillation: Secondary | ICD-10-CM | POA: Diagnosis not present

## 2019-01-03 DIAGNOSIS — M797 Fibromyalgia: Secondary | ICD-10-CM | POA: Diagnosis not present

## 2019-01-03 DIAGNOSIS — I350 Nonrheumatic aortic (valve) stenosis: Secondary | ICD-10-CM | POA: Diagnosis present

## 2019-01-03 DIAGNOSIS — M6281 Muscle weakness (generalized): Secondary | ICD-10-CM | POA: Diagnosis not present

## 2019-01-03 DIAGNOSIS — N183 Chronic kidney disease, stage 3 (moderate): Secondary | ICD-10-CM | POA: Diagnosis not present

## 2019-01-03 DIAGNOSIS — F341 Dysthymic disorder: Secondary | ICD-10-CM | POA: Diagnosis present

## 2019-01-03 DIAGNOSIS — E669 Obesity, unspecified: Secondary | ICD-10-CM | POA: Diagnosis present

## 2019-01-03 DIAGNOSIS — Z9049 Acquired absence of other specified parts of digestive tract: Secondary | ICD-10-CM | POA: Diagnosis not present

## 2019-01-03 DIAGNOSIS — Z9011 Acquired absence of right breast and nipple: Secondary | ICD-10-CM

## 2019-01-03 DIAGNOSIS — Z9842 Cataract extraction status, left eye: Secondary | ICD-10-CM

## 2019-01-03 DIAGNOSIS — N3000 Acute cystitis without hematuria: Secondary | ICD-10-CM | POA: Diagnosis not present

## 2019-01-03 DIAGNOSIS — I428 Other cardiomyopathies: Secondary | ICD-10-CM | POA: Diagnosis present

## 2019-01-03 DIAGNOSIS — E1165 Type 2 diabetes mellitus with hyperglycemia: Secondary | ICD-10-CM | POA: Diagnosis present

## 2019-01-03 DIAGNOSIS — Z853 Personal history of malignant neoplasm of breast: Secondary | ICD-10-CM

## 2019-01-03 DIAGNOSIS — M5489 Other dorsalgia: Secondary | ICD-10-CM | POA: Diagnosis not present

## 2019-01-03 DIAGNOSIS — F329 Major depressive disorder, single episode, unspecified: Secondary | ICD-10-CM | POA: Diagnosis present

## 2019-01-03 DIAGNOSIS — Z9841 Cataract extraction status, right eye: Secondary | ICD-10-CM | POA: Diagnosis not present

## 2019-01-03 DIAGNOSIS — I4891 Unspecified atrial fibrillation: Secondary | ICD-10-CM | POA: Diagnosis not present

## 2019-01-03 DIAGNOSIS — R52 Pain, unspecified: Secondary | ICD-10-CM | POA: Diagnosis not present

## 2019-01-03 DIAGNOSIS — Z87891 Personal history of nicotine dependence: Secondary | ICD-10-CM

## 2019-01-03 DIAGNOSIS — Z79891 Long term (current) use of opiate analgesic: Secondary | ICD-10-CM

## 2019-01-03 DIAGNOSIS — R2689 Other abnormalities of gait and mobility: Secondary | ICD-10-CM | POA: Diagnosis not present

## 2019-01-03 DIAGNOSIS — B961 Klebsiella pneumoniae [K. pneumoniae] as the cause of diseases classified elsewhere: Secondary | ICD-10-CM | POA: Diagnosis present

## 2019-01-03 DIAGNOSIS — E1122 Type 2 diabetes mellitus with diabetic chronic kidney disease: Secondary | ICD-10-CM | POA: Diagnosis present

## 2019-01-03 DIAGNOSIS — S199XXA Unspecified injury of neck, initial encounter: Secondary | ICD-10-CM | POA: Diagnosis not present

## 2019-01-03 DIAGNOSIS — E785 Hyperlipidemia, unspecified: Secondary | ICD-10-CM | POA: Diagnosis present

## 2019-01-03 DIAGNOSIS — I10 Essential (primary) hypertension: Secondary | ICD-10-CM | POA: Diagnosis present

## 2019-01-03 DIAGNOSIS — E875 Hyperkalemia: Secondary | ICD-10-CM | POA: Diagnosis present

## 2019-01-03 DIAGNOSIS — S299XXA Unspecified injury of thorax, initial encounter: Secondary | ICD-10-CM | POA: Diagnosis not present

## 2019-01-03 DIAGNOSIS — I129 Hypertensive chronic kidney disease with stage 1 through stage 4 chronic kidney disease, or unspecified chronic kidney disease: Secondary | ICD-10-CM | POA: Diagnosis present

## 2019-01-03 DIAGNOSIS — I4892 Unspecified atrial flutter: Secondary | ICD-10-CM | POA: Diagnosis present

## 2019-01-03 DIAGNOSIS — R296 Repeated falls: Secondary | ICD-10-CM | POA: Diagnosis not present

## 2019-01-03 DIAGNOSIS — W19XXXA Unspecified fall, initial encounter: Secondary | ICD-10-CM

## 2019-01-03 DIAGNOSIS — Z993 Dependence on wheelchair: Secondary | ICD-10-CM

## 2019-01-03 DIAGNOSIS — R079 Chest pain, unspecified: Secondary | ICD-10-CM | POA: Diagnosis not present

## 2019-01-03 DIAGNOSIS — Z7951 Long term (current) use of inhaled steroids: Secondary | ICD-10-CM

## 2019-01-03 DIAGNOSIS — E119 Type 2 diabetes mellitus without complications: Secondary | ICD-10-CM

## 2019-01-03 DIAGNOSIS — F419 Anxiety disorder, unspecified: Secondary | ICD-10-CM | POA: Diagnosis present

## 2019-01-03 DIAGNOSIS — R29898 Other symptoms and signs involving the musculoskeletal system: Secondary | ICD-10-CM | POA: Diagnosis not present

## 2019-01-03 DIAGNOSIS — Z888 Allergy status to other drugs, medicaments and biological substances status: Secondary | ICD-10-CM

## 2019-01-03 DIAGNOSIS — R55 Syncope and collapse: Secondary | ICD-10-CM | POA: Diagnosis not present

## 2019-01-03 DIAGNOSIS — M545 Low back pain: Secondary | ICD-10-CM | POA: Diagnosis not present

## 2019-01-03 DIAGNOSIS — T07XXXA Unspecified multiple injuries, initial encounter: Secondary | ICD-10-CM | POA: Diagnosis not present

## 2019-01-03 LAB — CBC WITH DIFFERENTIAL/PLATELET
ABS IMMATURE GRANULOCYTES: 0.01 10*3/uL (ref 0.00–0.07)
BASOS ABS: 0.1 10*3/uL (ref 0.0–0.1)
Basophils Relative: 1 %
Eosinophils Absolute: 0.1 10*3/uL (ref 0.0–0.5)
Eosinophils Relative: 2 %
HCT: 37.5 % (ref 36.0–46.0)
HEMOGLOBIN: 10.5 g/dL — AB (ref 12.0–15.0)
Immature Granulocytes: 0 %
LYMPHS ABS: 2 10*3/uL (ref 0.7–4.0)
LYMPHS PCT: 27 %
MCH: 24.4 pg — ABNORMAL LOW (ref 26.0–34.0)
MCHC: 28 g/dL — AB (ref 30.0–36.0)
MCV: 87 fL (ref 80.0–100.0)
Monocytes Absolute: 0.7 10*3/uL (ref 0.1–1.0)
Monocytes Relative: 9 %
NRBC: 0 % (ref 0.0–0.2)
Neutro Abs: 4.6 10*3/uL (ref 1.7–7.7)
Neutrophils Relative %: 61 %
Platelets: 222 10*3/uL (ref 150–400)
RBC: 4.31 MIL/uL (ref 3.87–5.11)
RDW: 16.1 % — ABNORMAL HIGH (ref 11.5–15.5)
WBC: 7.5 10*3/uL (ref 4.0–10.5)

## 2019-01-03 LAB — COMPREHENSIVE METABOLIC PANEL
ALT: 9 U/L (ref 0–44)
AST: 12 U/L — AB (ref 15–41)
Albumin: 3.5 g/dL (ref 3.5–5.0)
Alkaline Phosphatase: 45 U/L (ref 38–126)
Anion gap: 8 (ref 5–15)
BUN: 41 mg/dL — AB (ref 8–23)
CHLORIDE: 112 mmol/L — AB (ref 98–111)
CO2: 22 mmol/L (ref 22–32)
CREATININE: 1.87 mg/dL — AB (ref 0.44–1.00)
Calcium: 9 mg/dL (ref 8.9–10.3)
GFR calc Af Amer: 28 mL/min — ABNORMAL LOW (ref >60)
GFR calc non Af Amer: 24 mL/min — ABNORMAL LOW (ref >60)
Glucose, Bld: 240 mg/dL — ABNORMAL HIGH (ref 70–99)
POTASSIUM: 5.4 mmol/L — AB (ref 3.5–5.1)
SODIUM: 142 mmol/L (ref 135–145)
Total Bilirubin: 0.7 mg/dL (ref 0.3–1.2)
Total Protein: 6.3 g/dL — ABNORMAL LOW (ref 6.5–8.1)

## 2019-01-03 LAB — URINALYSIS, ROUTINE W REFLEX MICROSCOPIC
Bilirubin Urine: NEGATIVE
Glucose, UA: 50 mg/dL — AB
Ketones, ur: NEGATIVE mg/dL
NITRITE: NEGATIVE
PROTEIN: 100 mg/dL — AB
SPECIFIC GRAVITY, URINE: 1.023 (ref 1.005–1.030)
pH: 5 (ref 5.0–8.0)

## 2019-01-03 LAB — LACTIC ACID, PLASMA
Lactic Acid, Venous: 1.2 mmol/L (ref 0.5–1.9)
Lactic Acid, Venous: 0.9 mmol/L (ref 0.5–1.9)

## 2019-01-03 LAB — CK: CK TOTAL: 84 U/L (ref 38–234)

## 2019-01-03 LAB — TROPONIN I: Troponin I: <0.03 ng/mL (ref <0.03)

## 2019-01-03 MED ORDER — SODIUM CHLORIDE 0.9 % IV SOLN
INTRAVENOUS | Status: DC
  Administered 2019-01-03: 22:00:00 via INTRAVENOUS

## 2019-01-03 MED ORDER — ONDANSETRON HCL 4 MG PO TABS: 4.0000 mg | ORAL_TABLET | Freq: Four times a day (QID) | ORAL | Status: DC | PRN

## 2019-01-03 MED ORDER — SODIUM CHLORIDE 0.9 % IV SOLN
1.0000 g | Freq: Once | INTRAVENOUS | Status: AC
  Administered 2019-01-03: 1 g via INTRAVENOUS
  Filled 2019-01-03: qty 10

## 2019-01-03 MED ORDER — ONDANSETRON HCL 4 MG/2ML IJ SOLN: 4.0000 mg | Freq: Four times a day (QID) | INTRAMUSCULAR | Status: DC | PRN

## 2019-01-03 MED ORDER — HEPARIN SOD (PORK) LOCK FLUSH 100 UNIT/ML IV SOLN
INTRAVENOUS | Status: AC
  Administered 2019-01-03: 21:00:00
  Filled 2019-01-03: qty 5

## 2019-01-03 MED ORDER — SODIUM CHLORIDE 0.9 % IV SOLN
INTRAVENOUS | Status: DC
  Administered 2019-01-04 – 2019-01-05 (×2): via INTRAVENOUS

## 2019-01-03 MED ORDER — ACETAMINOPHEN 650 MG RE SUPP: 650.0000 mg | Freq: Four times a day (QID) | RECTAL | Status: DC | PRN

## 2019-01-03 MED ORDER — ENOXAPARIN SODIUM 40 MG/0.4ML ~~LOC~~ SOLN
40.0000 mg | SUBCUTANEOUS | Status: DC
  Administered 2019-01-04 – 2019-01-08 (×5): 40 mg via SUBCUTANEOUS
  Filled 2019-01-03 (×5): qty 0.4

## 2019-01-03 MED ORDER — SODIUM CHLORIDE 0.9 % IV SOLN
1.0000 g | INTRAVENOUS | Status: DC
  Filled 2019-01-03: qty 10

## 2019-01-03 MED ORDER — ACETAMINOPHEN 325 MG PO TABS
650.0000 mg | ORAL_TABLET | Freq: Four times a day (QID) | ORAL | Status: DC | PRN
  Administered 2019-01-04 – 2019-01-06 (×2): 650 mg via ORAL
  Filled 2019-01-03 (×2): qty 2

## 2019-01-03 NOTE — ED Notes (Addendum)
CONTACT INFO:  Luretha Rued (nephew): [phone] Scheryl Marten (nephew's wife): [phone]

## 2019-01-03 NOTE — ED Provider Notes (Signed)
Blood pressure (!) 152/89, pulse 97, temperature 98 F (36.7 C), resp. rate 16, height $RemoveBe'5\' 10"'WpcVOlUoy$  (1.778 m), weight 99.8 kg, SpO2 (!) 83 %.  Assuming care from Dr. Thurnell Garbe.  In short, [name] is a 85 y.o. female with a chief complaint of Fall .  Refer to the original H&P for additional details.  Patient arrived by EMS after being found down for unknown period of time.  She is complaining of generalized weakness.  CK is normal.  Labs show a mild hyperkalemia and CKD.  Patient with urinalysis suggesting UTI.  Suspect that this is the source of the patient's weakness and started Rocephin.  Imaging shows no acute findings.  Plan for admit for UTI treatment, IV hydration, and PT/OT evaluation.   Discussed patient's case with Hospitalist, Dr. Olevia Bowens to request admission. Patient and family (if present) updated with plan. Care transferred to Hospitalist service.  I reviewed all nursing notes, vitals, pertinent old records, EKGs, labs, imaging (as available).    Margette Fast, MD 01/03/19 2348

## 2019-01-03 NOTE — H&P (Signed)
History and Physical    PHOENYX MELKA XTK:[id number] DOB: 1933-05-11 DOA: 01/03/2019  PCP: Redmond School, MD   Patient coming from: Home.  I have personally briefly reviewed patient's old medical records in [place]  Chief Complaint: Weakness.  HPI: [name] is a 85 y.o. female with medical history significant of anemia, aortic stenosis, osteoarthritis, history of asthma, history of atrial flutter/fibrillation, B12 deficiency, breast cancer, cerebrovascular disease, depression, type 2 diabetes, diabetic retinopathy, frequent falls is coming to the emergency department after having difficulty getting him off from the floor since 10 in the morning.  She states she did not fall, she was just unable to have her legs work after she was trying to back up the stairs with a package.  She complains that she has not been progressively weaker for the past month.  She denies fever, chills, but feels fatigued.  Denies dyspnea, wheezing, hemoptysis, chest pain, palpitations, dizziness, diaphoresis, PND, orthopnea, but get occasional lower extremity edema.  She denies abdominal pain, nausea or vomiting, diarrhea or constipation, melena or hematochezia.  No dysuria, frequency or hematuria.  Denies polyuria, polydipsia, polyphagia or blurred vision.  ED Course: The patient initial vital signs temperature 98 F, pulse 104, respirations 18, blood pressure 156/108 mmHg and O2 sat 96% on room air.  Shewas given Rocephin 1 g IVPB in the emergency department.  Her urinalysis was cloudy, with glucosuria of 50 and proteinuria 100 mg/dL.  There was moderate hemoglobinuria and leukocyte esterase.  Microscopic exam reveals 21-50 RBC more than 50 WBC and many bacteria.  White count was 7.5 with 61% neutrophils, 27% lymphocytes and 9% monocytes.  Hemoglobin was 10.5 g/dL and platelets 222.  Troponin and lactic acid were normal.  Imaging: Chest radiograph shows cardiomegaly.  Possible basilar atelectasis versus  airspace disease versus scaring.  CT head and CT cervical spine did not show any acute abnormalities.  However there was some old small right basal ganglia and left cerebellar infarcts.  There was C-spine spondylolisthesis.  Lumbar spine did not show any acute abnormality, but show multilevel DDD.  Please see images and full radiology report for further detail.  Review of Systems: As per HPI otherwise 10 point review of systems negative.   Past Medical History:  Diagnosis Date  . Anemia   . Aortic stenosis    Mild  . Arthritis   . Asthma   . Atrial flutter ([place]) 2002  . B12 deficiency 06/20/2015  . B12 deficiency 06/20/2015  . Breast carcinoma ([place])   . Cerebrovascular disease    with a 99% LICA; repeat study in 10/2009-no obstructive disease; modest ASVD  . Chronic kidney disease    Creatinine-1.5 in 2010 and 2.5-3 in 2011; 1.5-10/2010;  Klebsiella UTI-10/2010. urine protein 36 mg/dl, mildly elevated  . Decreased bone density   . Depression   . Diabetes mellitus   . Diabetic retinopathy   . DJD (degenerative joint disease) 11/14/2011  . Falls infrequently 01/2010   fracture of pelvis and left humerus  . Fibromyalgia   . Headaches, cluster   . Hyperlipidemia   . Hypertension   . IBS (irritable bowel syndrome)    diverticulosis, gastroesophageal reflux disease  . Lower back pain   . Obesity 11/14/2011  . Spinal stenosis     Past Surgical History:  Procedure Laterality Date  . APPENDECTOMY    . CATARACT EXTRACTION, BILATERAL     Lens implants  . CENTRAL VENOUS CATHETER TUNNELED INSERTION SINGLE LUMEN    .  CHOLECYSTECTOMY    . COLONOSCOPY  2012  . EYE SURGERY  nov 2012   for diabetic retinopathy  . LUMBAR DISC SURGERY     lumbosacral spine procedure x 2  . MASTECTOMY     Right breast for carcinoma     reports that she quit smoking about 38 years ago. Her smoking use included cigarettes. She has a 30.00 pack-year smoking history. She has never used smokeless tobacco. She  reports that she does not drink alcohol or use drugs.  Allergies  Allergen Reactions  . Mold Extract [Trichophyton Mentagrophyte] Other (See Comments)    Reaction:  Itchy, watery eyes/sneezing   . Morphine And Related Other (See Comments)    Reaction:  Drowsiness. "I didn't wake up for almost 1 week" after taking morphine.   . Niaspan [Niacin] Other (See Comments)    Reaction:  Flushing   . Pollen Extract Other (See Comments)    Reaction:  Itchy, watery eyes/sneezing     Family History  Problem Relation Age of Onset  . Alzheimer's disease Mother   . Heart attack Father   . Aneurysm Sister   . Coronary artery disease Brother    Prior to Admission medications   Medication Sig Start Date End Date Taking? Authorizing Provider  albuterol (PROAIR HFA) 108 (90 BASE) MCG/ACT inhaler Inhale 2 puffs into the lungs every 6 (six) hours as needed for wheezing or shortness of breath. 06/16/14  Yes Ellouise Newer, PA-C  aspirin EC 81 MG tablet Take 81 mg by mouth daily.   Yes [provider]  clonazePAM (KLONOPIN) 0.5 MG tablet Take 0.5 mg by mouth daily as needed for anxiety.  07/15/18  Yes [provider]  Diclofenac Sodium 3 % GEL Apply 1 application topically 3 (three) times daily. 01/30/17  Yes [provider]  esomeprazole (NEXIUM) 40 MG capsule Take 40 mg by mouth daily.    Yes [provider]  furosemide (LASIX) 40 MG tablet Take 1 tablet (40 mg total) by mouth daily. Patient taking differently: Take 80 mg by mouth 3 (three) times daily.  11/23/16  Yes Leroy Sea, MD  glipiZIDE (GLUCOTROL) 10 MG tablet Take 10 mg by mouth 2 (two) times daily before a meal.  08/08/18  Yes [provider]  HYDROcodone-acetaminophen (NORCO) 10-325 MG per tablet Take 1 tablet by mouth 2 (two) times daily as needed for moderate pain.    Yes [provider]  insulin aspart (NOVOLOG FLEXPEN) 100 UNIT/ML FlexPen Inject 8 Units into the skin 3 (three) times  daily with meals. Patient taking differently: Inject 20 Units into the skin 2 (two) times daily.  02/17/17  Yes Johnson, Clanford L, MD  Insulin Degludec (TRESIBA FLEXTOUCH) 200 UNIT/ML SOPN Inject 12 Units into the skin at bedtime. Patient taking differently: Inject 20 Units into the skin at bedtime.  02/17/17  Yes Johnson, Clanford L, MD  loperamide (IMODIUM) 2 MG capsule Take 2 mg by mouth as needed for diarrhea or loose stools.  08/13/18  Yes [provider]  PARoxetine (PAXIL) 40 MG tablet Take 40 mg by mouth daily.    Yes [provider]  potassium chloride SA (K-DUR,KLOR-CON) 20 MEQ tablet Take 20 mEq by mouth daily as needed (for low potassium).   Yes [provider]  pregabalin (LYRICA) 50 MG capsule Take 50 mg by mouth 3 (three) times daily.    Yes [provider]  raloxifene (EVISTA) 60 MG tablet Take 60 mg by mouth  daily.   Yes [provider]  rosuvastatin (CRESTOR) 40 MG tablet Take 1 tablet (40 mg total) by mouth at bedtime. 07/20/15  Yes Herminio Commons, MD  cyanocobalamin (,VITAMIN B-12,) 1000 MCG/ML injection Inject 1,000 mcg into the muscle every 30 (thirty) days.    [provider]  fenofibrate (TRICOR) 145 MG tablet Take 145 mg by mouth daily.    [provider]  sitaGLIPtin (JANUVIA) 25 MG tablet Take 25 mg by mouth daily.    [provider]    Physical Exam: Vitals:   01/03/19 2100 01/03/19 2130 01/03/19 2200 01/03/19 2230  BP: (!) 146/95 (!) 138/124 (!) 152/89 (!) 148/78  Pulse: (!) 107 (!) 116 97 98  Resp: $Remo'14 14 16 14  'Mwlzu$ Temp:      SpO2: 97% 96% (!) 83% 95%  Weight:      Height:        Constitutional: NAD, calm, comfortable Eyes: PERRL, lids and conjunctivae normal ENMT: Mucous membranes are mildly dry. Posterior pharynx clear of any exudate or lesions. Neck: normal, supple, no masses, no thyromegaly Respiratory: clear to auscultation bilaterally, no wheezing, no crackles. Normal respiratory  effort. No accessory muscle use.  Cardiovascular: Regular rate and rhythm, no murmurs / rubs / gallops. No extremity edema. 2+ pedal pulses. No carotid bruits.  Abdomen: Obese, soft, positive suprapubic tenderness, no guarding or rebound or masses palpated. No hepatosplenomegaly. Bowel sounds positive.  Musculoskeletal: no clubbing / cyanosis.Good ROM, no contractures. Normal muscle tone.  Skin: no acute rashes, lesions, ulcers on limited dermatological examination. Neurologic: CN 2-12 grossly intact. Sensation intact, DTR normal.  Generalized weakness. Psychiatric: Normal judgment and insight. Alert and oriented x 3. Normal mood.   Labs on Admission: I have personally reviewed following labs and imaging studies  CBC: Recent Labs  Lab 01/03/19 2121  WBC 7.5  NEUTROABS 4.6  HGB 10.5*  HCT 37.5  MCV 87.0  PLT 213   Basic Metabolic Panel: Recent Labs  Lab 01/03/19 2121  NA 142  K 5.4*  CL 112*  CO2 22  GLUCOSE 240*  BUN 41*  CREATININE 1.87*  CALCIUM 9.0   GFR: Estimated Creatinine Clearance: 29.1 mL/min (A) (by C-G formula based on SCr of 1.87 mg/dL (H)). Liver Function Tests: Recent Labs  Lab 01/03/19 2121  AST 12*  ALT 9  ALKPHOS 45  BILITOT 0.7  PROT 6.3*  ALBUMIN 3.5   No results for input(s): LIPASE, AMYLASE in the last 168 hours. No results for input(s): AMMONIA in the last 168 hours. Coagulation Profile: No results for input(s): INR, PROTIME in the last 168 hours. Cardiac Enzymes: Recent Labs  Lab 01/03/19 2121  CKTOTAL 84  TROPONINI <0.03   BNP (last 3 results) No results for input(s): PROBNP in the last 8760 hours. HbA1C: No results for input(s): HGBA1C in the last 72 hours. CBG: No results for input(s): GLUCAP in the last 168 hours. Lipid Profile: No results for input(s): CHOL, HDL, LDLCALC, TRIG, CHOLHDL, LDLDIRECT in the last 72 hours. Thyroid Function Tests: No results for input(s): TSH, T4TOTAL, FREET4, T3FREE, THYROIDAB in the last 72  hours. Anemia Panel: No results for input(s): VITAMINB12, FOLATE, FERRITIN, TIBC, IRON, RETICCTPCT in the last 72 hours. Urine analysis:    Component Value Date/Time   COLORURINE YELLOW 01/03/2019 2055   APPEARANCEUR CLOUDY (A) 01/03/2019 2055   LABSPEC 1.023 01/03/2019 2055   PHURINE 5.0 01/03/2019 2055   GLUCOSEU 50 (A) 01/03/2019 2055   GLUCOSEU neg 04/07/2010  HGBUR MODERATE (A) 01/03/2019 2055   BILIRUBINUR NEGATIVE 01/03/2019 2055   [place] 01/03/2019 2055   PROTEINUR 100 (A) 01/03/2019 2055   UROBILINOGEN 0.2 09/27/2014 1630   NITRITE NEGATIVE 01/03/2019 2055   LEUKOCYTESUR MODERATE (A) 01/03/2019 2055    Radiological Exams on Admission: Dg Chest 1 View  Result Date: 01/03/2019 CLINICAL DATA:  Fall and pain. EXAM: CHEST  1 VIEW COMPARISON:  01/03/2018 and prior radiographs FINDINGS: Cardiomegaly and pulmonary vascular congestion noted. Increased LEFT mid and lower opacities noted which may represent atelectasis, scarring or airspace disease. No definite pleural effusion noted. No pneumothorax. A LEFT sided Port-A-Cath is again noted. IMPRESSION: Cardiomegaly with pulmonary vascular congestion. Increased LEFT mid and lower lung opacities which may represent atelectasis, scarring or possibly airspace disease. Electronically Signed   By: Margarette Canada M.D.   On: 01/03/2019 22:11   Dg Lumbar Spine Complete  Result Date: 01/03/2019 CLINICAL DATA:  Acute low back pain following fall. Initial encounter. EXAM: LUMBAR SPINE - COMPLETE 4+ VIEW COMPARISON:  11/10/2016 CT and prior studies FINDINGS: Five non rib-bearing lumbar type vertebra are again identified. No acute fracture subluxation. Posterior rod/bipedicular screw and interbody fusion at L3-L4-L5 again noted without significant change. Very mild remote compression fracture of the L1 SUPERIOR endplate identified. Multilevel degenerative changes are again noted with moderate to severe degenerative disc disease at L2-3. Aortic  atherosclerotic calcifications are present. IMPRESSION: 1. No evidence of acute intracranial abnormality 2. L3-L5 fusion, multilevel degenerative disc disease and mild remote L1 SUPERIOR endplate compression fracture. Electronically Signed   By: Margarette Canada M.D.   On: 01/03/2019 22:15   Ct Head Wo Contrast  Result Date: 01/03/2019 CLINICAL DATA:  [place] this morning, found down. History of frequent falls, stroke, breast cancer, hypertension and hyperlipidemia. EXAM: CT HEAD WITHOUT CONTRAST CT CERVICAL SPINE WITHOUT CONTRAST TECHNIQUE: Multidetector CT imaging of the head and cervical spine was performed following the standard protocol without intravenous contrast. Multiplanar CT image reconstructions of the cervical spine were also generated. COMPARISON:  CT HEAD January 03, 2018 and CT cervical spine December 02, 2011 FINDINGS: CT HEAD FINDINGS BRAIN: No intraparenchymal hemorrhage, mass effect nor midline shift. No parenchymal brain volume loss for age. No hydrocephalus. Patchy supratentorial white matter hypodensities less than expected for patient's age, though non-specific are most compatible with chronic small vessel ischemic disease. 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 No acute large vascular territory infarcts. No abnormal extra-axial fluid collections. Basal cisterns are patent. VASCULAR: Moderate to severe calcific atherosclerosis of the carotid siphons. SKULL: No skull fracture. No significant scalp soft tissue swelling. SINUSES/ORBITS: RIGHT sphenoid sinus mucosal thickening unchanged. Mastoid air cells are well aerated.The included ocular globes and orbital contents are non-suspicious. OTHER: None. CT CERVICAL SPINE FINDINGS ALIGNMENT: Maintained lordosis. Grade 1 C4-5 through C7-T1 anterolisthesis. Mild levoscoliosis mid cervical spine. SKULL BASE AND VERTEBRAE: Cervical vertebral bodies and posterior elements are intact. Severe RIGHT upper cervical facet arthropathy. SOFT  TISSUES AND SPINAL CANAL: Nonacute. Moderate calcific atherosclerosis carotid bifurcations. DISC LEVELS: No severe osseous canal stenosis or neural foraminal narrowing. UPPER CHEST: Lung apices are clear. OTHER: None. IMPRESSION: CT HEAD: 1. No acute intracranial process. 2. Stable examination including old small RIGHT basal ganglia and LEFT cerebellar infarcts. CT CERVICAL SPINE: 1. No fracture. 2. Grade 1 C4-5 through C7-T1 spondylolisthesis on degenerative basis. Electronically Signed   By: Elon Alas M.D.   On: 01/03/2019 22:10   Ct Cervical Spine Wo Contrast  Result Date: 01/03/2019  CLINICAL DATA:  [place] this morning, found down. History of frequent falls, stroke, breast cancer, hypertension and hyperlipidemia. EXAM: CT HEAD WITHOUT CONTRAST CT CERVICAL SPINE WITHOUT CONTRAST TECHNIQUE: Multidetector CT imaging of the head and cervical spine was performed following the standard protocol without intravenous contrast. Multiplanar CT image reconstructions of the cervical spine were also generated. COMPARISON:  CT HEAD January 03, 2018 and CT cervical spine December 02, 2011 FINDINGS: CT HEAD FINDINGS BRAIN: No intraparenchymal hemorrhage, mass effect nor midline shift. No parenchymal brain volume loss for age. No hydrocephalus. Patchy supratentorial white matter hypodensities less than expected for patient's age, though non-specific are most compatible with chronic small vessel ischemic disease. 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 No acute large vascular territory infarcts. No abnormal extra-axial fluid collections. Basal cisterns are patent. VASCULAR: Moderate to severe calcific atherosclerosis of the carotid siphons. SKULL: No skull fracture. No significant scalp soft tissue swelling. SINUSES/ORBITS: RIGHT sphenoid sinus mucosal thickening unchanged. Mastoid air cells are well aerated.The included ocular globes and orbital contents are non-suspicious. OTHER: None. CT CERVICAL  SPINE FINDINGS ALIGNMENT: Maintained lordosis. Grade 1 C4-5 through C7-T1 anterolisthesis. Mild levoscoliosis mid cervical spine. SKULL BASE AND VERTEBRAE: Cervical vertebral bodies and posterior elements are intact. Severe RIGHT upper cervical facet arthropathy. SOFT TISSUES AND SPINAL CANAL: Nonacute. Moderate calcific atherosclerosis carotid bifurcations. DISC LEVELS: No severe osseous canal stenosis or neural foraminal narrowing. UPPER CHEST: Lung apices are clear. OTHER: None. IMPRESSION: CT HEAD: 1. No acute intracranial process. 2. Stable examination including old small RIGHT basal ganglia and LEFT cerebellar infarcts. CT CERVICAL SPINE: 1. No fracture. 2. Grade 1 C4-5 through C7-T1 spondylolisthesis on degenerative basis. Electronically Signed   By: Elon Alas M.D.   On: 01/03/2019 22:10    EKG: Independently reviewed. Vent. rate 118 BPM PR interval * ms QRS duration 94 ms QT/QTc 346/485 ms P-R-T axes * 94 16 Atrial flutter with predominant 3:1 AV block Right axis deviation Low voltage, precordial leads  Assessment/Plan Principal Problem:   Acute lower UTI (urinary tract infection) Admit to telemetry/inpatient. Gentle IV fluids. Continue ceftriaxone 2 g IVPB every 24. Follow-up urine culture and sensitivity.  Active Problems:   Hyperlipidemia Continue Crestor 40 mg p.o. bedtime.    DEPRESSION/ANXIETY Continue paroxetine 40 mg p.o. daily. Continue clonazepam 0.5 mg p.o. at bedtime.    Anemia Monitor H&H.    Essential hypertension Patient on furosemide. Currently holding. Monitor BP, renal function electrolytes.    Atrial flutter (HCC)   Atrial fibrillation ([facility]) CHA?DS?-VASc Score of at least 8. Not on anticoagulation due to risk of falls. She states that to the best of her recollection she has never being on the blood thinner.  Not on rate control medication.  Continue daily 81 mg aspirin.  Check echocardiogram.    Type 2 diabetes mellitus (HCC) Carbohydrate  modified diet. Continue Lantus 20 units SQ at bedtime. Continue glipizide 10 mg p.o. twice daily. (Januvia nonformulary) Tradjenta 5 mg p.o. daily while in the hospital. Continue Lyrica 50 mg p.o. 3 times daily for neuropathy. CBG monitoring with regular insulin sliding scale.    CKD stage 3 due to type 2 diabetes mellitus (HCC) Continue gentle IV fluids. Follow-up renal function electrolytes    DVT prophylaxis: Lovenox sQ. Code Status: Full code. Family Communication:  Disposition Plan: Admit for UTI treatment and PT evaluation. Consults called: Admission status: Inpatient/telemetry.   Reubin Milan MD [facility]  01/03/2019, 10:53 PM

## 2019-01-03 NOTE — ED Provider Notes (Signed)
[facility] EMERGENCY DEPARTMENT Provider Note   CSN: [id number] Arrival date & time: 01/03/19  2009     History   Chief Complaint Chief Complaint  Patient presents with  . Fall    HPI [name] is a 85 y.o. female.  The history is provided by the patient and the EMS personnel. The history is limited by the condition of the patient (confusion).  Fall     Pt was seen at 2030. Per EMS and pt report: EMS states pt fell to the floor approximately 10am today and could not get up. Pt states she "backed up the stairs with a package." Pt denies being on the floor at all, but then states she "got up the steps by sitting down on them."  Pt very unclear regarding events today. Endorses increasing generalized weakness for the past 1 month. Describes this as "my legs are weak and just don't work." Pt c/o neck and back pain. Denies CP/palpitations, no SOB/cough, no abd pain, no N/V/D, no focal motor weakness, no tingling/numbness in extremities, no LOC/syncope.   Past Medical History:  Diagnosis Date  . Anemia   . Aortic stenosis    Mild  . Arthritis   . Asthma   . Atrial flutter ([place]) 2002  . B12 deficiency 06/20/2015  . B12 deficiency 06/20/2015  . Breast carcinoma ([place])   . Cerebrovascular disease    with a 58% LICA; repeat study in 10/2009-no obstructive disease; modest ASVD  . Chronic kidney disease    Creatinine-1.5 in 2010 and 2.5-3 in 2011; 1.5-10/2010;  Klebsiella UTI-10/2010. urine protein 36 mg/dl, mildly elevated  . Decreased bone density   . Depression   . Diabetes mellitus   . Diabetic retinopathy   . DJD (degenerative joint disease) 11/14/2011  . Falls infrequently 01/2010   fracture of pelvis and left humerus  . Fibromyalgia   . Headaches, cluster   . Hyperlipidemia   . Hypertension   . IBS (irritable bowel syndrome)    diverticulosis, gastroesophageal reflux disease  . Lower back pain   . Obesity 11/14/2011  . Spinal stenosis     Patient Active Problem List    Diagnosis Date Noted  . Hypoglycemia 02/16/2017  . Acute metabolic encephalopathy 83/25/4982  . Pressure injury of skin 02/16/2017  . Acute kidney injury superimposed on chronic kidney disease ([place]) 11/21/2016  . Hyperglycemia 11/21/2016  . Acute cystitis without hematuria 11/21/2016  . Diarrhea of presumed infectious origin 11/21/2016  . UTI (urinary tract infection) 11/10/2016  . Colitis 11/10/2016  . Elevated troponin I level 11/10/2016  . B12 deficiency 06/20/2015  . UTI (lower urinary tract infection) 05/21/2014  . DJD (degenerative joint disease) 11/14/2011  . Obesity 11/14/2011  . Inflammatory carcinoma of right breast   . Anemia   . IBS (irritable bowel syndrome)   . Essential hypertension   . Aortic stenosis   . Chronic kidney disease   . Fibromyalgia   . Cerebrovascular disease   . Atrial fibrillation ([place]) 07/14/2010  . Falls infrequently 01/31/2010  . HYPERLIPIDEMIA 11/15/2009  . 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 DEPRESSION/ANXIETY 10/12/2009  . SPINAL STENOSIS, LUMBAR 10/12/2009    Past Surgical History:  Procedure Laterality Date  . APPENDECTOMY    . CATARACT EXTRACTION, BILATERAL     Lens implants  . CENTRAL VENOUS CATHETER TUNNELED INSERTION SINGLE LUMEN    . CHOLECYSTECTOMY    . COLONOSCOPY  2012  . EYE SURGERY  nov 2012   for diabetic retinopathy  . LUMBAR DISC SURGERY     lumbosacral spine procedure x 2  . MASTECTOMY     Right breast for carcinoma     OB History   No obstetric history on file.      Home Medications    Prior to Admission medications   Medication Sig Start Date End Date Taking? Authorizing Provider  albuterol (PROAIR HFA) 108 (90 BASE) MCG/ACT inhaler Inhale 2 puffs into the lungs every 6 (six) hours as needed for wheezing or shortness of breath. 06/16/14   Baird Cancer, PA-C  aspirin EC 81 MG tablet Take 81 mg by mouth daily.    [provider]  clonazePAM Bobbye [place]) 0.5 MG  tablet  07/15/18   [provider]  cyanocobalamin (,VITAMIN B-12,) 1000 MCG/ML injection Inject 1,000 mcg into the muscle every 30 (thirty) days.    [provider]  Diclofenac Sodium 3 % GEL Apply 1 application topically 3 (three) times daily. 01/30/17   [provider]  esomeprazole (NEXIUM) 40 MG capsule Take 40 mg by mouth daily.     [provider]  fenofibrate (TRICOR) 145 MG tablet Take 145 mg by mouth daily.    [provider]  fluticasone (FLONASE) 50 MCG/ACT nasal spray Place 1 spray into both nostrils daily. 01/28/17   Baird Cancer, PA-C  furosemide (LASIX) 40 MG tablet Take 1 tablet (40 mg total) by mouth daily. 11/23/16   Thurnell Lose, MD  glipiZIDE (GLUCOTROL) 10 MG tablet  08/08/18   [provider]  glipiZIDE (GLUCOTROL) 10 MG tablet Take by mouth.    [provider]  HYDROcodone-acetaminophen (NORCO) 10-325 MG per tablet Take 1 tablet by mouth 2 (two) times daily as needed for moderate pain.     [provider]  insulin aspart (NOVOLOG FLEXPEN) 100 UNIT/ML FlexPen Inject 8 Units into the skin 3 (three) times daily with meals. 02/17/17   Johnson, Clanford L, MD  Insulin Degludec (TRESIBA FLEXTOUCH) 200 UNIT/ML SOPN Inject 12 Units into the skin at bedtime. 02/17/17   Johnson, Clanford L, MD  lisinopril (PRINIVIL,ZESTRIL) 20 MG tablet Take 20 mg by mouth daily.    [provider]  loperamide (IMODIUM) 2 MG capsule  08/13/18   [provider]  metoprolol tartrate (LOPRESSOR) 50 MG tablet Take 50 mg by mouth daily.    [provider]  PARoxetine (PAXIL) 40 MG tablet Take 40 mg by mouth daily.     [provider]  pregabalin (LYRICA) 50 MG capsule Take 50 mg by mouth 3 (three) times daily.     [provider]  raloxifene (EVISTA) 60 MG tablet Take 60 mg by mouth daily.    [provider]  rosuvastatin (CRESTOR) 40 MG tablet Take 1 tablet (40 mg total) by mouth  at bedtime. 07/20/15   Herminio Commons, MD  SURE COMFORT PEN NEEDLES 31G X 8 MM [place]  09/09/18   [provider]  traMADol (ULTRAM) 50 MG tablet Take 50-100 mg by mouth 4 (four) times daily as needed.    [provider]    Family History Family History  Problem Relation Age of Onset  . Alzheimer's disease Mother   . Heart attack Father   . Aneurysm Sister   . Coronary artery disease Brother     Social History Social History   Tobacco Use  . Smoking status: Former Smoker    Packs/day: 1.00  Years: 30.00    Pack years: 30.00    Types: Cigarettes    Last attempt to quit: 1982    Years since quitting: 38.1  . Smokeless tobacco: Never Used  Substance Use Topics  . Alcohol use: No  . Drug use: No     Allergies   Mold extract [trichophyton mentagrophyte]; Morphine and related; Niaspan [niacin]; and Pollen extract   Review of Systems Review of Systems  Unable to perform ROS: Mental status change     Physical Exam Updated Vital Signs BP (!) 146/95   Pulse (!) 107   Temp 98 F (36.7 C)   Resp 14   Ht $R'5\' 10"'hk$  (1.778 m)   Wt 99.8 kg   SpO2 97%   BMI 31.57 kg/m   Physical Exam 2035: Physical examination:  Nursing notes reviewed; Vital signs and O2 SAT reviewed;  Constitutional: Well developed, Well nourished, In no acute distress; Head:  Normocephalic, atraumatic; Eyes: EOMI, PERRL, No scleral icterus; ENMT: Mouth and pharynx normal, Mucous membranes dry;; Neck: Supple, Full range of motion, No lymphadenopathy; Cardiovascular: Irregular rate and rhythm, No gallop; Respiratory: Breath sounds clear & equal bilaterally, No wheezes.  Speaking full sentences with ease, Normal respiratory effort/excursion; Chest: Nontender, Movement normal; Abdomen: Soft, Nontender, Nondistended, Normal bowel sounds; Genitourinary: No CVA tenderness; Spine:  No midline CS, TS, LS tenderness.;; Extremities: Peripheral pulses normal, Pelvis stable. No tenderness, +2 pedal  edema bilat with right anterior tibia erythema > left. No calf asymmetry.; Neuro: Awake, alert, confused re: events. No facial droop. Major CN grossly intact.  Speech clear. Grips equal. Strength 4-5/5 bilat UE's and LE's. No gross focal motor deficits in extremities.; Skin: Color normal, Warm, Dry.   ED Treatments / Results  Labs (all labs ordered are listed, but only abnormal results are displayed)   EKG EKG Interpretation  Date/Time:  Saturday January 03 2019 20:43:24 EST Ventricular Rate:  118 PR Interval:    QRS Duration: 94 QT Interval:  346 QTC Calculation: 485 R Axis:   94 Text Interpretation:  Atrial flutter with predominant 3:1 AV block Right axis deviation Low voltage, precordial leads When compared with ECG of 01/03/2018 Atrial flutter has replaced Normal sinus rhythm Confirmed by Francine Graven [phone]) on 01/03/2019 8:54:58 PM   Radiology   Procedures Procedures (including critical care time)  Medications Ordered in ED Medications  0.9 %  sodium chloride infusion (has no administration in time range)  heparin lock flush 100 UNIT/ML injection (  Given 01/03/19 2108)     Initial Impression / Assessment and Plan / ED Course  I have reviewed the triage vital signs and the nursing notes.  Pertinent labs & imaging results that were available during my care of the patient were reviewed by me and considered in my medical decision making (see chart for details).  MDM Reviewed: previous chart, nursing note and vitals Interpretation: labs and ECG    2215:  +UTI, UC pending; IV rocephin ordered. Labs, CT/XR, orthostatic VS pending. Question pt's safety to return home tonight. Sign out to Dr. Laverta Baltimore.   Final Clinical Impressions(s) / ED Diagnoses   Final diagnoses:  None    ED Discharge Orders    None       Francine Graven, DO 01/03/19 2219

## 2019-01-03 NOTE — ED Triage Notes (Addendum)
Pt comes in by [facility] after falling at 10am today and has been on the floor every since. Pt states she did not fall she "just couldn't get off the floor because her legs didn't work, after backing up the stairs with her package." Pt c/o neck, back, and leg pain. States her arms and legs are weak and has been like this for a month. C-Collar in place upon arrival. Pt has fallen multiple times in the past month

## 2019-01-04 ENCOUNTER — Encounter (HOSPITAL_COMMUNITY): Payer: Self-pay | Admitting: Internal Medicine

## 2019-01-04 ENCOUNTER — Inpatient Hospital Stay (HOSPITAL_COMMUNITY): Payer: Medicare HMO

## 2019-01-04 DIAGNOSIS — E860 Dehydration: Secondary | ICD-10-CM

## 2019-01-04 LAB — CBC WITH DIFFERENTIAL/PLATELET
Abs Immature Granulocytes: 0.01 10*3/uL (ref 0.00–0.07)
Basophils Absolute: 0.1 10*3/uL (ref 0.0–0.1)
Basophils Relative: 1 %
EOS PCT: 2 %
Eosinophils Absolute: 0.1 10*3/uL (ref 0.0–0.5)
HCT: 34.7 % — ABNORMAL LOW (ref 36.0–46.0)
Hemoglobin: 9.7 g/dL — ABNORMAL LOW (ref 12.0–15.0)
Immature Granulocytes: 0 %
Lymphocytes Relative: 26 %
Lymphs Abs: 1.5 10*3/uL (ref 0.7–4.0)
MCH: 24.1 pg — ABNORMAL LOW (ref 26.0–34.0)
MCHC: 28 g/dL — ABNORMAL LOW (ref 30.0–36.0)
MCV: 86.1 fL (ref 80.0–100.0)
Monocytes Absolute: 0.6 10*3/uL (ref 0.1–1.0)
Monocytes Relative: 9 %
Neutro Abs: 3.7 10*3/uL (ref 1.7–7.7)
Neutrophils Relative %: 62 %
Platelets: 193 10*3/uL (ref 150–400)
RBC: 4.03 MIL/uL (ref 3.87–5.11)
RDW: 16.4 % — ABNORMAL HIGH (ref 11.5–15.5)
WBC: 6 10*3/uL (ref 4.0–10.5)
nRBC: 0 % (ref 0.0–0.2)

## 2019-01-04 LAB — BASIC METABOLIC PANEL
Anion gap: 5 (ref 5–15)
BUN: 40 mg/dL — ABNORMAL HIGH (ref 8–23)
CO2: 22 mmol/L (ref 22–32)
Calcium: 8.7 mg/dL — ABNORMAL LOW (ref 8.9–10.3)
Chloride: 113 mmol/L — ABNORMAL HIGH (ref 98–111)
Creatinine, Ser: 1.82 mg/dL — ABNORMAL HIGH (ref 0.44–1.00)
GFR calc non Af Amer: 25 mL/min — ABNORMAL LOW (ref >60)
GFR, EST AFRICAN AMERICAN: 29 mL/min — AB (ref >60)
Glucose, Bld: 311 mg/dL — ABNORMAL HIGH (ref 70–99)
Potassium: 5.1 mmol/L (ref 3.5–5.1)
SODIUM: 140 mmol/L (ref 135–145)

## 2019-01-04 LAB — GLUCOSE, CAPILLARY
GLUCOSE-CAPILLARY: 185 mg/dL — AB (ref 70–99)
Glucose-Capillary: 266 mg/dL — ABNORMAL HIGH (ref 70–99)
Glucose-Capillary: 220 mg/dL — ABNORMAL HIGH (ref 70–99)
Glucose-Capillary: 173 mg/dL — ABNORMAL HIGH (ref 70–99)
Glucose-Capillary: 148 mg/dL — ABNORMAL HIGH (ref 70–99)

## 2019-01-04 LAB — MRSA PCR SCREENING: MRSA BY PCR: NEGATIVE

## 2019-01-04 LAB — ECHOCARDIOGRAM COMPLETE
Height: 71 in
Weight: 3933.01 oz

## 2019-01-04 MED ORDER — LINAGLIPTIN 5 MG PO TABS
5.0000 mg | ORAL_TABLET | Freq: Every day | ORAL | Status: DC
  Administered 2019-01-04 – 2019-01-08 (×5): 5 mg via ORAL
  Filled 2019-01-04 (×5): qty 1

## 2019-01-04 MED ORDER — HYDROCODONE-ACETAMINOPHEN 10-325 MG PO TABS
1.0000 | ORAL_TABLET | Freq: Two times a day (BID) | ORAL | Status: DC | PRN
  Administered 2019-01-04 – 2019-01-08 (×8): 1 via ORAL
  Filled 2019-01-04 (×8): qty 1

## 2019-01-04 MED ORDER — INSULIN ASPART 100 UNIT/ML ~~LOC~~ SOLN
0.0000 [IU] | Freq: Three times a day (TID) | SUBCUTANEOUS | Status: DC
  Administered 2019-01-04: 5 [IU] via SUBCUTANEOUS
  Administered 2019-01-04: 3 [IU] via SUBCUTANEOUS
  Administered 2019-01-04: 8 [IU] via SUBCUTANEOUS
  Administered 2019-01-05: 3 [IU] via SUBCUTANEOUS
  Administered 2019-01-05: 5 [IU] via SUBCUTANEOUS
  Administered 2019-01-06: 3 [IU] via SUBCUTANEOUS
  Administered 2019-01-06 – 2019-01-07 (×3): 5 [IU] via SUBCUTANEOUS
  Administered 2019-01-07: 3 [IU] via SUBCUTANEOUS
  Administered 2019-01-07: 5 [IU] via SUBCUTANEOUS
  Administered 2019-01-08 (×2): 3 [IU] via SUBCUTANEOUS

## 2019-01-04 MED ORDER — ROSUVASTATIN CALCIUM 20 MG PO TABS
40.0000 mg | ORAL_TABLET | Freq: Every day | ORAL | Status: DC
  Administered 2019-01-04 – 2019-01-07 (×5): 40 mg via ORAL
  Filled 2019-01-04 (×5): qty 2

## 2019-01-04 MED ORDER — CLONAZEPAM 0.5 MG PO TABS: 0.5000 mg | ORAL_TABLET | Freq: Every day | ORAL | Status: DC | PRN

## 2019-01-04 MED ORDER — INSULIN GLARGINE 100 UNIT/ML ~~LOC~~ SOLN
20.0000 [IU] | Freq: Every day | SUBCUTANEOUS | Status: DC
  Filled 2019-01-04 (×2): qty 0.2

## 2019-01-04 MED ORDER — SODIUM CHLORIDE 0.9 % IV SOLN
1.0000 g | Freq: Once | INTRAVENOUS | Status: AC
  Administered 2019-01-04: 1 g via INTRAVENOUS
  Filled 2019-01-04: qty 10

## 2019-01-04 MED ORDER — ASPIRIN EC 81 MG PO TBEC
81.0000 mg | DELAYED_RELEASE_TABLET | Freq: Every day | ORAL | Status: DC
  Administered 2019-01-04 – 2019-01-08 (×5): 81 mg via ORAL
  Filled 2019-01-04 (×5): qty 1

## 2019-01-04 MED ORDER — RALOXIFENE HCL 60 MG PO TABS
60.0000 mg | ORAL_TABLET | Freq: Every day | ORAL | Status: DC
  Administered 2019-01-04 – 2019-01-08 (×5): 60 mg via ORAL
  Filled 2019-01-04 (×5): qty 1

## 2019-01-04 MED ORDER — GLIPIZIDE 5 MG PO TABS
10.0000 mg | ORAL_TABLET | Freq: Two times a day (BID) | ORAL | Status: DC
  Administered 2019-01-04 – 2019-01-08 (×9): 10 mg via ORAL
  Filled 2019-01-04 (×9): qty 2

## 2019-01-04 MED ORDER — HYDRALAZINE HCL 20 MG/ML IJ SOLN: 10.0000 mg | INTRAMUSCULAR | Status: DC | PRN

## 2019-01-04 MED ORDER — LOPERAMIDE HCL 2 MG PO CAPS: 2.0000 mg | ORAL_CAPSULE | ORAL | Status: DC | PRN

## 2019-01-04 MED ORDER — PAROXETINE HCL 20 MG PO TABS
40.0000 mg | ORAL_TABLET | Freq: Every day | ORAL | Status: DC
  Administered 2019-01-04 – 2019-01-08 (×5): 40 mg via ORAL
  Filled 2019-01-04 (×5): qty 2

## 2019-01-04 MED ORDER — PANTOPRAZOLE SODIUM 40 MG PO TBEC
40.0000 mg | DELAYED_RELEASE_TABLET | Freq: Every day | ORAL | Status: DC
  Administered 2019-01-04 – 2019-01-08 (×5): 40 mg via ORAL
  Filled 2019-01-04 (×5): qty 1

## 2019-01-04 MED ORDER — SODIUM CHLORIDE 0.9 % IV SOLN
2.0000 g | INTRAVENOUS | Status: DC
  Administered 2019-01-04 – 2019-01-05 (×2): 2 g via INTRAVENOUS
  Filled 2019-01-04: qty 20
  Filled 2019-01-04 (×2): qty 2
  Filled 2019-01-04: qty 20

## 2019-01-04 MED ORDER — INSULIN GLARGINE 100 UNIT/ML ~~LOC~~ SOLN
25.0000 [IU] | Freq: Every day | SUBCUTANEOUS | Status: DC
  Administered 2019-01-04 – 2019-01-07 (×3): 25 [IU] via SUBCUTANEOUS
  Filled 2019-01-04 (×5): qty 0.25

## 2019-01-04 MED ORDER — PREGABALIN 50 MG PO CAPS
50.0000 mg | ORAL_CAPSULE | Freq: Three times a day (TID) | ORAL | Status: DC
  Administered 2019-01-04 – 2019-01-08 (×13): 50 mg via ORAL
  Filled 2019-01-04 (×13): qty 1

## 2019-01-04 NOTE — Progress Notes (Signed)
*  PRELIMINARY RESULTS* Echocardiogram 2D Echocardiogram has been performed.  Leavy Cella 01/04/2019, 2:45 PM

## 2019-01-04 NOTE — Progress Notes (Signed)
PROGRESS NOTE    [name]  ZOX:[id number] DOB: 05/25/1933 DOA: 01/03/2019 PCP: Elfredia Nevins, MD   Brief Narrative:  Per HPI: [name] is a 85 y.o. female with medical history significant of anemia, aortic stenosis, osteoarthritis, history of asthma, history of atrial flutter/fibrillation, B12 deficiency, breast cancer, cerebrovascular disease, depression, type 2 diabetes, diabetic retinopathy, frequent falls is coming to the emergency department after having difficulty getting him off from the floor since 10 in the morning.  She states she did not fall, she was just unable to have her legs work after she was trying to back up the stairs with a package.  She complains that she has not been progressively weaker for the past month.  She denies fever, chills, but feels fatigued.  Denies dyspnea, wheezing, hemoptysis, chest pain, palpitations, dizziness, diaphoresis, PND, orthopnea, but get occasional lower extremity edema.  She denies abdominal pain, nausea or vomiting, diarrhea or constipation, melena or hematochezia.  No dysuria, frequency or hematuria.  Denies polyuria, polydipsia, polyphagia or blurred vision.  Patient was admitted for worsening weakness and higher frequency of falls with noted acute lower UTI for which she has been started on Rocephin.  Assessment & Plan:   Principal Problem:   Acute lower UTI (urinary tract infection) Active Problems:   Hyperlipidemia   DEPRESSION/ANXIETY   Atrial fibrillation (HCC)   Anemia   Essential hypertension   Atrial flutter (HCC)   Type 2 diabetes mellitus (HCC)   CKD stage 3 due to type 2 diabetes mellitus (HCC)   1. Worsening weakness with increased frequency of falls secondary to acute lower UTI.  Continue on Rocephin and follow urine cultures and sensitivity.  Maintain on gentle IV fluid.  Fall precautions with PT evaluation pending. 2. Depression/anxiety.  Continue on paroxetine and clonazepam. 3. Dyslipidemia.  Continue on  Crestor 40 mg at bedtime. 4. Anemia.  Continue to monitor H&H. 5. Essential hypertension.  Currently with holding furosemide, but noted with elevated blood pressures for which hydralazine will be initiated. 6. CKD stage III due to type 2 diabetes.  Continue gentle IV fluid and follow-up repeat renal panel in a.m. 7. Type 2 diabetes.  With noted hyperglycemia.  Continue on Lantus, glipizide, Tradjenta and SSI.  Will increase doses as needed. 8. Atrial fibrillation.  Without anticoagulation due to risk of falls.  Continue aspirin 81 mg daily.  Checking 2D echocardiogram due to some cardiomegaly and noted prior aortic stenosis which may play a role.   DVT prophylaxis: Lovenox Code Status: Full code Family Communication: None at bedside Disposition Plan: Continue treatment for UTI and monitor cultures.  Maintain on Rocephin.  PT evaluation and 2D echocardiogram pending.   Consultants:   None  Procedures:   None  Antimicrobials:   Rocephin 2/1->   Subjective: Patient seen and evaluated today with no new acute complaints or concerns. No acute concerns or events noted overnight.  She is having a bowel movement on her bedpan and appears to be overall feeling still quite weak and overcome with malaise.  Objective: Vitals:   01/04/19 0012 01/04/19 0017 01/04/19 0034 01/04/19 0508  BP:  (!) 187/92 (!) 168/82 127/73  Pulse:  84  100  Resp:  16  18  Temp:  97.8 F (36.6 C)  98.4 F (36.9 C)  TempSrc:  Oral  Oral  SpO2:  97%  95%  Weight: 111.5 kg     Height: 5\' 11"  (1.803 m)       Intake/Output Summary (  Last 24 hours) at 01/04/2019 0949 Last data filed at 01/04/2019 0300 Gross per 24 hour  Intake 905.84 ml  Output -  Net 905.84 ml   Filed Weights   01/03/19 2019 01/04/19 0012  Weight: 99.8 kg 111.5 kg    Examination:  General exam: Appears calm and comfortable  Respiratory system: Clear to auscultation. Respiratory effort normal.  Currently on nasal cannula. Cardiovascular  system: S1 & S2 heard, RRR. No JVD, murmurs, rubs, gallops or clicks. No pedal edema. Gastrointestinal system: Abdomen is nondistended, soft and nontender. No organomegaly or masses felt. Normal bowel sounds heard. Central nervous system: Alert and oriented. No focal neurological deficits. Extremities: Symmetric 5 x 5 power. Skin: No rashes, lesions or ulcers Psychiatry: Cannot be evaluated.    Data Reviewed: I have personally reviewed following labs and imaging studies  CBC: Recent Labs  Lab 01/03/19 2121 01/04/19 0442  WBC 7.5 6.0  NEUTROABS 4.6 3.7  HGB 10.5* 9.7*  HCT 37.5 34.7*  MCV 87.0 86.1  PLT 222 193   Basic Metabolic Panel: Recent Labs  Lab 01/03/19 2121 01/04/19 0442  NA 142 140  K 5.4* 5.1  CL 112* 113*  CO2 22 22  GLUCOSE 240* 311*  BUN 41* 40*  CREATININE 1.87* 1.82*  CALCIUM 9.0 8.7*   GFR: Estimated Creatinine Clearance: 32.2 mL/min (A) (by C-G formula based on SCr of 1.82 mg/dL (H)). Liver Function Tests: Recent Labs  Lab 01/03/19 2121  AST 12*  ALT 9  ALKPHOS 45  BILITOT 0.7  PROT 6.3*  ALBUMIN 3.5   No results for input(s): LIPASE, AMYLASE in the last 168 hours. No results for input(s): AMMONIA in the last 168 hours. Coagulation Profile: No results for input(s): INR, PROTIME in the last 168 hours. Cardiac Enzymes: Recent Labs  Lab 01/03/19 2121  CKTOTAL 84  TROPONINI <0.03   BNP (last 3 results) No results for input(s): PROBNP in the last 8760 hours. HbA1C: No results for input(s): HGBA1C in the last 72 hours. CBG: Recent Labs  Lab 01/04/19 0011 01/04/19 0729  GLUCAP 185* 266*   Lipid Profile: No results for input(s): CHOL, HDL, LDLCALC, TRIG, CHOLHDL, LDLDIRECT in the last 72 hours. Thyroid Function Tests: No results for input(s): TSH, T4TOTAL, FREET4, T3FREE, THYROIDAB in the last 72 hours. Anemia Panel: No results for input(s): VITAMINB12, FOLATE, FERRITIN, TIBC, IRON, RETICCTPCT in the last 72 hours. Sepsis  Labs: Recent Labs  Lab 01/03/19 2121 01/03/19 2246  LATICACIDVEN 1.2 0.9    Recent Results (from the past 240 hour(s))  MRSA PCR Screening     Status: None   Collection Time: 01/04/19 12:52 AM  Result Value Ref Range Status   MRSA by PCR NEGATIVE NEGATIVE Final    Comment:        The GeneXpert MRSA Assay (FDA approved for NASAL specimens only), is one component of a comprehensive MRSA colonization surveillance program. It is not intended to diagnose MRSA infection nor to guide or monitor treatment for MRSA infections. Performed at [facility], [address] [place], [place] [address]          Radiology Studies: Dg Chest 1 View  Result Date: 01/03/2019 CLINICAL DATA:  Fall and pain. EXAM: CHEST  1 VIEW COMPARISON:  01/03/2018 and prior radiographs FINDINGS: Cardiomegaly and pulmonary vascular congestion noted. Increased LEFT mid and lower opacities noted which may represent atelectasis, scarring or airspace disease. No definite pleural effusion noted. No pneumothorax. A LEFT sided Port-A-Cath is again noted. IMPRESSION: Cardiomegaly  with pulmonary vascular congestion. Increased LEFT mid and lower lung opacities which may represent atelectasis, scarring or possibly airspace disease. Electronically Signed   By: Harmon Pier M.D.   On: 01/03/2019 22:11   Dg Lumbar Spine Complete  Result Date: 01/03/2019 CLINICAL DATA:  Acute low back pain following fall. Initial encounter. EXAM: LUMBAR SPINE - COMPLETE 4+ VIEW COMPARISON:  11/10/2016 CT and prior studies FINDINGS: Five non rib-bearing lumbar type vertebra are again identified. No acute fracture subluxation. Posterior rod/bipedicular screw and interbody fusion at L3-L4-L5 again noted without significant change. Very mild remote compression fracture of the L1 SUPERIOR endplate identified. Multilevel degenerative changes are again noted with moderate to severe degenerative disc disease at L2-3. Aortic atherosclerotic calcifications are  present. IMPRESSION: 1. No evidence of acute intracranial abnormality 2. L3-L5 fusion, multilevel degenerative disc disease and mild remote L1 SUPERIOR endplate compression fracture. Electronically Signed   By: Harmon Pier M.D.   On: 01/03/2019 22:15   Ct Head Wo Contrast  Result Date: 01/03/2019 CLINICAL DATA:  Larey Seat this morning, found down. History of frequent falls, stroke, breast cancer, hypertension and hyperlipidemia. EXAM: CT HEAD WITHOUT CONTRAST CT CERVICAL SPINE WITHOUT CONTRAST TECHNIQUE: Multidetector CT imaging of the head and cervical spine was performed following the standard protocol without intravenous contrast. Multiplanar CT image reconstructions of the cervical spine were also generated. COMPARISON:  CT HEAD January 03, 2018 and CT cervical spine December 02, 2011 FINDINGS: CT HEAD FINDINGS BRAIN: No intraparenchymal hemorrhage, mass effect nor midline shift. No parenchymal brain volume loss for age. No hydrocephalus. Patchy supratentorial white matter hypodensities less than expected for patient's age, though non-specific are most compatible with chronic small vessel ischemic disease. 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 No acute large vascular territory infarcts. No abnormal extra-axial fluid collections. Basal cisterns are patent. VASCULAR: Moderate to severe calcific atherosclerosis of the carotid siphons. SKULL: No skull fracture. No significant scalp soft tissue swelling. SINUSES/ORBITS: RIGHT sphenoid sinus mucosal thickening unchanged. Mastoid air cells are well aerated.The included ocular globes and orbital contents are non-suspicious. OTHER: None. CT CERVICAL SPINE FINDINGS ALIGNMENT: Maintained lordosis. Grade 1 C4-5 through C7-T1 anterolisthesis. Mild levoscoliosis mid cervical spine. SKULL BASE AND VERTEBRAE: Cervical vertebral bodies and posterior elements are intact. Severe RIGHT upper cervical facet arthropathy. SOFT TISSUES AND SPINAL CANAL: Nonacute.  Moderate calcific atherosclerosis carotid bifurcations. DISC LEVELS: No severe osseous canal stenosis or neural foraminal narrowing. UPPER CHEST: Lung apices are clear. OTHER: None. IMPRESSION: CT HEAD: 1. No acute intracranial process. 2. Stable examination including old small RIGHT basal ganglia and LEFT cerebellar infarcts. CT CERVICAL SPINE: 1. No fracture. 2. Grade 1 C4-5 through C7-T1 spondylolisthesis on degenerative basis. Electronically Signed   By: Awilda Metro M.D.   On: 01/03/2019 22:10   Ct Cervical Spine Wo Contrast  Result Date: 01/03/2019 CLINICAL DATA:  Larey Seat this morning, found down. History of frequent falls, stroke, breast cancer, hypertension and hyperlipidemia. EXAM: CT HEAD WITHOUT CONTRAST CT CERVICAL SPINE WITHOUT CONTRAST TECHNIQUE: Multidetector CT imaging of the head and cervical spine was performed following the standard protocol without intravenous contrast. Multiplanar CT image reconstructions of the cervical spine were also generated. COMPARISON:  CT HEAD January 03, 2018 and CT cervical spine December 02, 2011 FINDINGS: CT HEAD FINDINGS BRAIN: No intraparenchymal hemorrhage, mass effect nor midline shift. No parenchymal brain volume loss for age. No hydrocephalus. Patchy supratentorial white matter hypodensities less than expected for patient's age, though non-specific are most compatible with chronic small  vessel ischemic disease. 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 No acute large vascular territory infarcts. No abnormal extra-axial fluid collections. Basal cisterns are patent. VASCULAR: Moderate to severe calcific atherosclerosis of the carotid siphons. SKULL: No skull fracture. No significant scalp soft tissue swelling. SINUSES/ORBITS: RIGHT sphenoid sinus mucosal thickening unchanged. Mastoid air cells are well aerated.The included ocular globes and orbital contents are non-suspicious. OTHER: None. CT CERVICAL SPINE FINDINGS ALIGNMENT: Maintained  lordosis. Grade 1 C4-5 through C7-T1 anterolisthesis. Mild levoscoliosis mid cervical spine. SKULL BASE AND VERTEBRAE: Cervical vertebral bodies and posterior elements are intact. Severe RIGHT upper cervical facet arthropathy. SOFT TISSUES AND SPINAL CANAL: Nonacute. Moderate calcific atherosclerosis carotid bifurcations. DISC LEVELS: No severe osseous canal stenosis or neural foraminal narrowing. UPPER CHEST: Lung apices are clear. OTHER: None. IMPRESSION: CT HEAD: 1. No acute intracranial process. 2. Stable examination including old small RIGHT basal ganglia and LEFT cerebellar infarcts. CT CERVICAL SPINE: 1. No fracture. 2. Grade 1 C4-5 through C7-T1 spondylolisthesis on degenerative basis. Electronically Signed   By: Awilda Metro M.D.   On: 01/03/2019 22:10        Scheduled Meds: . aspirin EC  81 mg Oral Daily  . enoxaparin (LOVENOX) injection  40 mg Subcutaneous Q24H  . glipiZIDE  10 mg Oral BID AC  . insulin aspart  0-15 Units Subcutaneous TID WC  . insulin glargine  20 Units Subcutaneous QHS  . linagliptin  5 mg Oral Daily  . pantoprazole  40 mg Oral Daily  . PARoxetine  40 mg Oral Daily  . pregabalin  50 mg Oral TID  . raloxifene  60 mg Oral Daily  . rosuvastatin  40 mg Oral QHS   Continuous Infusions: . sodium chloride 75 mL/hr at 01/03/19 2307  . cefTRIAXone (ROCEPHIN)  IV    . cefTRIAXone (ROCEPHIN)  IV       LOS: 1 day    Time spent: 30 minutes    Diontae Route Hoover Brunette, DO [facility] Pager [phone]  If 7PM-7AM, please contact night-coverage [email or web address] Password [facility] 01/04/2019, 9:49 AM

## 2019-01-04 NOTE — Progress Notes (Signed)
Late Entry: Patient refusing orthostatic vitals and ambulation on admission. Patient stated that she felt too weak to slide herself from the stretcher to the bed. Will continue to monitor.

## 2019-01-04 NOTE — Progress Notes (Signed)
Second attempt to do echo. Patient needed to have another bowel movement. I asked the patient did she want me to come back tomorrow. She said that would be fine.   First attempt 945  Second attempt 1042

## 2019-01-05 LAB — CBC WITH DIFFERENTIAL/PLATELET
Abs Immature Granulocytes: 0.02 10*3/uL (ref 0.00–0.07)
BASOS PCT: 1 %
Basophils Absolute: 0.1 10*3/uL (ref 0.0–0.1)
Eosinophils Absolute: 0.3 10*3/uL (ref 0.0–0.5)
Eosinophils Relative: 4 %
HCT: 35.8 % — ABNORMAL LOW (ref 36.0–46.0)
Hemoglobin: 10.2 g/dL — ABNORMAL LOW (ref 12.0–15.0)
Immature Granulocytes: 0 %
Lymphocytes Relative: 21 %
Lymphs Abs: 1.5 10*3/uL (ref 0.7–4.0)
MCH: 24.9 pg — ABNORMAL LOW (ref 26.0–34.0)
MCHC: 28.5 g/dL — ABNORMAL LOW (ref 30.0–36.0)
MCV: 87.3 fL (ref 80.0–100.0)
Monocytes Absolute: 0.6 10*3/uL (ref 0.1–1.0)
Monocytes Relative: 8 %
Neutro Abs: 4.9 10*3/uL (ref 1.7–7.7)
Neutrophils Relative %: 66 %
PLATELETS: 201 10*3/uL (ref 150–400)
RBC: 4.1 MIL/uL (ref 3.87–5.11)
RDW: 16.4 % — ABNORMAL HIGH (ref 11.5–15.5)
WBC: 7.4 10*3/uL (ref 4.0–10.5)
nRBC: 0 % (ref 0.0–0.2)

## 2019-01-05 LAB — BASIC METABOLIC PANEL
Anion gap: 5 (ref 5–15)
BUN: 38 mg/dL — ABNORMAL HIGH (ref 8–23)
CO2: 22 mmol/L (ref 22–32)
Calcium: 9.1 mg/dL (ref 8.9–10.3)
Chloride: 116 mmol/L — ABNORMAL HIGH (ref 98–111)
Creatinine, Ser: 1.87 mg/dL — ABNORMAL HIGH (ref 0.44–1.00)
GFR calc Af Amer: 28 mL/min — ABNORMAL LOW (ref >60)
GFR calc non Af Amer: 24 mL/min — ABNORMAL LOW (ref >60)
Glucose, Bld: 162 mg/dL — ABNORMAL HIGH (ref 70–99)
Potassium: 5 mmol/L (ref 3.5–5.1)
Sodium: 143 mmol/L (ref 135–145)

## 2019-01-05 LAB — GLUCOSE, CAPILLARY
Glucose-Capillary: 153 mg/dL — ABNORMAL HIGH (ref 70–99)
Glucose-Capillary: 239 mg/dL — ABNORMAL HIGH (ref 70–99)
Glucose-Capillary: 114 mg/dL — ABNORMAL HIGH (ref 70–99)
Glucose-Capillary: 133 mg/dL — ABNORMAL HIGH (ref 70–99)

## 2019-01-05 MED ORDER — FUROSEMIDE 40 MG PO TABS
40.0000 mg | ORAL_TABLET | Freq: Every day | ORAL | Status: DC
  Administered 2019-01-05 – 2019-01-08 (×4): 40 mg via ORAL
  Filled 2019-01-05 (×4): qty 1

## 2019-01-05 MED ORDER — METOPROLOL TARTRATE 5 MG/5ML IV SOLN
5.0000 mg | Freq: Once | INTRAVENOUS | Status: AC
  Administered 2019-01-05: 5 mg via INTRAVENOUS
  Filled 2019-01-05: qty 5

## 2019-01-05 MED ORDER — METOPROLOL TARTRATE 25 MG PO TABS
25.0000 mg | ORAL_TABLET | Freq: Two times a day (BID) | ORAL | Status: DC
  Administered 2019-01-05 – 2019-01-07 (×5): 25 mg via ORAL
  Filled 2019-01-05 (×5): qty 1

## 2019-01-05 NOTE — NC FL2 (Signed)
[place] LEVEL OF CARE SCREENING TOOL     IDENTIFICATION  Patient Name: [name] Birthdate: 1932/12/28 Sex: female Admission Date (Current Location): 01/03/2019  [facility] and [place] Number:  [employer] and Address:  [facility] [address], [place]      Provider Number: [id number]  Attending Physician Name and Address:  Rodena Goldmann, DO  Relative Name and Phone Number:       Current Level of Care: Hospital Recommended Level of Care: [place] Prior Approval Number:    Date Approved/Denied:   PASRR Number: [id number]  Discharge Plan: SNF    Current Diagnoses: Patient Active Problem List   Diagnosis Date Noted  . Acute lower UTI (urinary tract infection) 01/03/2019  . Type 2 diabetes mellitus ([place]) 01/03/2019  . CKD stage 3 due to type 2 diabetes mellitus ([place]) 01/03/2019  . Hypoglycemia 02/16/2017  . Acute metabolic encephalopathy 73/22/0254  . Pressure injury of skin 02/16/2017  . Acute kidney injury superimposed on chronic kidney disease ([place]) 11/21/2016  . Hyperglycemia 11/21/2016  . Acute cystitis without hematuria 11/21/2016  . Diarrhea of presumed infectious origin 11/21/2016  . UTI (urinary tract infection) 11/10/2016  . Colitis 11/10/2016  . Elevated troponin I level 11/10/2016  . B12 deficiency 06/20/2015  . UTI (lower urinary tract infection) 05/21/2014  . DJD (degenerative joint disease) 11/14/2011  . Obesity 11/14/2011  . Inflammatory carcinoma of right breast   . Anemia   . IBS (irritable bowel syndrome)   . Essential hypertension   . Aortic stenosis   . Chronic kidney disease   . Fibromyalgia   . Cerebrovascular disease   . Atrial fibrillation ([place]) 07/14/2010  . Falls infrequently 01/31/2010  . Hyperlipidemia 11/15/2009  . Type 2 DM with CKD stage 4 and hypertension ([place]) 10/12/2009  . DEPRESSION/ANXIETY 10/12/2009  . SPINAL STENOSIS, LUMBAR 10/12/2009  . Atrial  flutter ([place]) 12/03/2000    Orientation RESPIRATION BLADDER Height & Weight     Self, Time, Situation, Place  Normal Continent Weight: 245 lb 13 oz (111.5 kg) Height:  $Remove'5\' 11"'aFBMunO$  (180.3 cm)  BEHAVIORAL SYMPTOMS/MOOD NEUROLOGICAL BOWEL NUTRITION STATUS      Continent Diet(heart healthy/carb modified)  AMBULATORY STATUS COMMUNICATION OF NEEDS Skin   Extensive Assist Verbally Normal                       Personal Care Assistance Level of Assistance  Bathing, Feeding, Dressing Bathing Assistance: Limited assistance Feeding assistance: Independent Dressing Assistance: Limited assistance     Functional Limitations Info  Sight, Hearing, Speech Sight Info: Adequate Hearing Info: Adequate Speech Info: Adequate    SPECIAL CARE FACTORS FREQUENCY  PT (By licensed PT)     PT Frequency: 5x/week              Contractures Contractures Info: Not present    Additional Factors Info  Code Status, Allergies, Psychotropic Code Status Info: Full code Allergies Info: mold extract, morphine and related, niaspan, pollen exrract Psychotropic Info: Klonopin, Paxil         Current Medications (01/05/2019):  This is the current hospital active medication list Current Facility-Administered Medications  Medication Dose Route Frequency Provider Last Rate Last Dose  . acetaminophen (TYLENOL) tablet 650 mg  650 mg Oral Q6H PRN Reubin Milan, MD   650 mg at 01/04/19 1156   Or  . acetaminophen (TYLENOL) suppository 650 mg  650 mg Rectal Q6H PRN  Reubin Milan, MD      . aspirin EC tablet 81 mg  81 mg Oral Daily Reubin Milan, MD   81 mg at 01/05/19 0849  . cefTRIAXone (ROCEPHIN) 2 g in sodium chloride 0.9 % 100 mL IVPB  2 g Intravenous Q24H Reubin Milan, MD 200 mL/hr at 01/04/19 2213 2 g at 01/04/19 2213  . clonazePAM (KLONOPIN) tablet 0.5 mg  0.5 mg Oral Daily PRN Reubin Milan, MD      . enoxaparin (LOVENOX) injection 40 mg  40 mg Subcutaneous Q24H Reubin Milan, MD   40 mg at 01/05/19 7416  . furosemide (LASIX) tablet 40 mg  40 mg Oral Daily Manuella Ghazi, Pratik D, DO   40 mg at 01/05/19 1037  . glipiZIDE (GLUCOTROL) tablet 10 mg  10 mg Oral BID AC Reubin Milan, MD   10 mg at 01/05/19 0849  . hydrALAZINE (APRESOLINE) injection 10 mg  10 mg Intravenous Q4H PRN Manuella Ghazi, Pratik D, DO      . HYDROcodone-acetaminophen (NORCO) 10-325 MG per tablet 1 tablet  1 tablet Oral BID PRN Reubin Milan, MD   1 tablet at 01/05/19 3845  . insulin aspart (novoLOG) injection 0-15 Units  0-15 Units Subcutaneous TID WC Reubin Milan, MD   5 Units at 01/05/19 1248  . insulin glargine (LANTUS) injection 25 Units  25 Units Subcutaneous QHS Heath Lark D, DO   25 Units at 01/04/19 2208  . linagliptin (TRADJENTA) tablet 5 mg  5 mg Oral Daily Reubin Milan, MD   5 mg at 01/05/19 0849  . loperamide (IMODIUM) capsule 2 mg  2 mg Oral PRN Reubin Milan, MD      . metoprolol tartrate (LOPRESSOR) tablet 25 mg  25 mg Oral BID Heath Lark D, DO   25 mg at 01/05/19 1034  . ondansetron (ZOFRAN) tablet 4 mg  4 mg Oral Q6H PRN Reubin Milan, MD       Or  . ondansetron [facility]) injection 4 mg  4 mg Intravenous Q6H PRN Reubin Milan, MD      . pantoprazole (PROTONIX) EC tablet 40 mg  40 mg Oral Daily Reubin Milan, MD   40 mg at 01/05/19 0849  . PARoxetine (PAXIL) tablet 40 mg  40 mg Oral Daily Reubin Milan, MD   40 mg at 01/05/19 0848  . pregabalin (LYRICA) capsule 50 mg  50 mg Oral TID Reubin Milan, MD   50 mg at 01/05/19 0849  . raloxifene (EVISTA) tablet 60 mg  60 mg Oral Daily Reubin Milan, MD   60 mg at 01/05/19 0849  . rosuvastatin (CRESTOR) tablet 40 mg  40 mg Oral QHS Reubin Milan, MD   40 mg at 01/04/19 2208   Facility-Administered Medications Ordered in Other Encounters  Medication Dose Route Frequency Provider Last Rate Last Dose  . sodium chloride 0.9 % injection 10 mL  10 mL Intravenous PRN Baird Cancer, PA-C   10 mL at 10/20/15 1205     Discharge Medications: Please see discharge summary for a list of discharge medications.  Relevant Imaging Results:  Relevant Lab Results:   Additional Information SSN [id number] Clydene Pugh, LCSW

## 2019-01-05 NOTE — Clinical Social Work Placement (Signed)
   CLINICAL SOCIAL WORK PLACEMENT  NOTE  Date:  01/05/2019  Patient Details  Name: [name] MRN: [id number] Date of Birth: Jun 14, 1933  Clinical Social Work is seeking post-discharge placement for this patient at the [place] level of care (*CSW will initial, date and re-position this form in  chart as items are completed):  Yes   Patient/family provided with [place] Work Department's list of facilities offering this level of care within the geographic area requested by the patient (or if unable, by the patient's family).  Yes   Patient/family informed of their freedom to choose among providers that offer the needed level of care, that participate in Medicare, Medicaid or managed care program needed by the patient, have an available bed and are willing to accept the patient.  Yes   Patient/family informed of [facility]'s ownership interest in [facility] and [facility], as well as of the fact that they are under no obligation to receive care at these facilities.  PASRR submitted to EDS on       PASRR number received on       Existing PASRR number confirmed on 01/05/19     FL2 transmitted to all facilities in geographic area requested by pt/family on 01/05/19     FL2 transmitted to all facilities within larger geographic area on       Patient informed that his/her managed care company has contracts with or will negotiate with certain facilities, including the following:            Patient/family informed of bed offers received.  Patient chooses bed at       Physician recommends and patient chooses bed at      Patient to be transferred to   on  .  Patient to be transferred to facility by       Patient family notified on   of transfer.  Name of family member notified:        PHYSICIAN       Additional Comment:    _______________________________________________ Ihor Gully, LCSW 01/05/2019, 3:20 PM

## 2019-01-05 NOTE — Progress Notes (Signed)
PROGRESS NOTE    [name]  WUJ:[id number] DOB: Feb 04, 1933 DOA: 01/03/2019 PCP: Elfredia Nevins, MD   Brief Narrative:  Per HPI: [name] a 85 y.o.femalewith medical history significant ofanemia, aortic stenosis, osteoarthritis, history of asthma, history of atrial flutter/fibrillation, B12 deficiency, breast cancer, cerebrovascular disease, depression, type 2 diabetes, diabetic retinopathy, frequent falls is coming to the emergency department after having difficulty getting him off from the floor since 10 in the morning. She states she did not fall, she was just unable to have her legs work after she was trying to back up the stairs with a package. She complains that she has not been progressively weaker for the past month. She denies fever, chills, but feels fatigued. Denies dyspnea, wheezing, hemoptysis, chest pain, palpitations, dizziness, diaphoresis, PND, orthopnea, but get occasional lower extremity edema. She denies abdominal pain, nausea or vomiting, diarrhea or constipation, melena or hematochezia. No dysuria, frequency or hematuria. Denies polyuria, polydipsia, polyphagia or blurred vision.  Patient was admitted for worsening weakness and higher frequency of falls with noted acute lower UTI for which she has been started on Rocephin.  Assessment & Plan:   Principal Problem:   Acute lower UTI (urinary tract infection) Active Problems:   Hyperlipidemia   DEPRESSION/ANXIETY   Atrial fibrillation (HCC)   Anemia   Essential hypertension   Atrial flutter (HCC)   Type 2 diabetes mellitus (HCC)   CKD stage 3 due to type 2 diabetes mellitus (HCC)  1. Worsening weakness with increased frequency of falls secondary to acute lower UTI.  Continue on Rocephin and follow urine cultures and sensitivity.    Stop IV fluid. Fall precautions with PT evaluation pending. 2. Cardiomyopathy with LVEF 45 to 50%.  This will need further evaluation in the outpatient setting as this is  a change from previous.  No significant findings of valvular stenosis identified. 3. Depression/anxiety.  Continue on paroxetine and clonazepam. 4. Dyslipidemia.  Continue on Crestor 40 mg at bedtime. 5. Anemia.  Continue to monitor H&H. 6. Essential hypertension.  Restart home furosemide today.  Hydralazine added as needed. 7. CKD stage III due to type 2 diabetes.    Follow-up renal panel in a.m. and stop IV fluid today.  Type 2 diabetes.  With noted hyperglycemia that has improved.  Continue on Lantus, glipizide, Tradjenta and SSI.  Will increase doses as needed. 8. Atrial fibrillation with RVR.  Without anticoagulation due to risk of falls.  Continue aspirin 81 mg daily.    2D echocardiogram with depressed LVEF noted.  Will start beta-blocker at this time and continue to monitor on telemetry for signs of improvement.  Will need outpatient cardiology follow-up.   DVT prophylaxis: Lovenox Code Status: Full code Family Communication: None at bedside Disposition Plan: Continue treatment for UTI and monitor cultures.  Maintain on Rocephin.  PT evaluation pending for today.  Patient is wheelchair-bound at home.  Will need cardiology outpatient follow-up on discharge.  Wean oxygen to room air.   Consultants:   None  Procedures:   None  Antimicrobials:   Rocephin 2/1->   Subjective: Patient seen and evaluated today with no new acute complaints or concerns. No acute concerns or events noted overnight.  She was noted to have elevated heart rate overnight and went into atrial fibrillation with RVR with some improved control with IV metoprolol, but continues to maintain heart rate in the 120 range this morning.  She is otherwise asymptomatic with no chest pain or shortness of breath noted.  Objective: Vitals:   01/04/19 0508 01/04/19 1332 01/04/19 2158 01/05/19 0530  BP: 127/73 123/74 (!) 147/81 (!) 157/97  Pulse: 100 (!) 107 (!) 110 (!) 120  Resp: 18 18 16 17   Temp: 98.4 F (36.9  C) 98.3 F (36.8 C) 99.3 F (37.4 C) 98.4 F (36.9 C)  TempSrc: Oral  Oral   SpO2: 95% 97% 98% 100%  Weight:      Height:        Intake/Output Summary (Last 24 hours) at 01/05/2019 0912 Last data filed at 01/05/2019 0400 Gross per 24 hour  Intake 2534.32 ml  Output -  Net 2534.32 ml   Filed Weights   01/03/19 2019 01/04/19 0012  Weight: 99.8 kg 111.5 kg    Examination:  General exam: Appears calm and comfortable  Respiratory system: Clear to auscultation. Respiratory effort normal.  Currently on nasal cannula. Cardiovascular system: S1 & S2 heard, RRR. No JVD, murmurs, rubs, gallops or clicks. No pedal edema. Gastrointestinal system: Abdomen is nondistended, soft and nontender. No organomegaly or masses felt. Normal bowel sounds heard. Central nervous system: Alert and oriented. No focal neurological deficits. Extremities: Symmetric 5 x 5 power. Skin: No rashes, lesions or ulcers Psychiatry: Judgement and insight appear normal. Mood & affect appropriate.     Data Reviewed: I have personally reviewed following labs and imaging studies  CBC: Recent Labs  Lab 01/03/19 2121 01/04/19 0442 01/05/19 0522  WBC 7.5 6.0 7.4  NEUTROABS 4.6 3.7 4.9  HGB 10.5* 9.7* 10.2*  HCT 37.5 34.7* 35.8*  MCV 87.0 86.1 87.3  PLT 222 193 201   Basic Metabolic Panel: Recent Labs  Lab 01/03/19 2121 01/04/19 0442 01/05/19 0522  NA 142 140 143  K 5.4* 5.1 5.0  CL 112* 113* 116*  CO2 22 22 22   GLUCOSE 240* 311* 162*  BUN 41* 40* 38*  CREATININE 1.87* 1.82* 1.87*  CALCIUM 9.0 8.7* 9.1   GFR: Estimated Creatinine Clearance: 31.3 mL/min (A) (by C-G formula based on SCr of 1.87 mg/dL (H)). Liver Function Tests: Recent Labs  Lab 01/03/19 2121  AST 12*  ALT 9  ALKPHOS 45  BILITOT 0.7  PROT 6.3*  ALBUMIN 3.5   No results for input(s): LIPASE, AMYLASE in the last 168 hours. No results for input(s): AMMONIA in the last 168 hours. Coagulation Profile: No results for input(s):  INR, PROTIME in the last 168 hours. Cardiac Enzymes: Recent Labs  Lab 01/03/19 2121  CKTOTAL 84  TROPONINI <0.03   BNP (last 3 results) No results for input(s): PROBNP in the last 8760 hours. HbA1C: No results for input(s): HGBA1C in the last 72 hours. CBG: Recent Labs  Lab 01/04/19 0729 01/04/19 1125 01/04/19 1554 01/04/19 2214 01/05/19 0747  GLUCAP 266* 220* 173* 148* 153*   Lipid Profile: No results for input(s): CHOL, HDL, LDLCALC, TRIG, CHOLHDL, LDLDIRECT in the last 72 hours. Thyroid Function Tests: No results for input(s): TSH, T4TOTAL, FREET4, T3FREE, THYROIDAB in the last 72 hours. Anemia Panel: No results for input(s): VITAMINB12, FOLATE, FERRITIN, TIBC, IRON, RETICCTPCT in the last 72 hours. Sepsis Labs: Recent Labs  Lab 01/03/19 2121 01/03/19 2246  LATICACIDVEN 1.2 0.9    Recent Results (from the past 240 hour(s))  MRSA PCR Screening     Status: None   Collection Time: 01/04/19 12:52 AM  Result Value Ref Range Status   MRSA by PCR NEGATIVE NEGATIVE Final    Comment:        The GeneXpert MRSA Assay (FDA approved  for NASAL specimens only), is one component of a comprehensive MRSA colonization surveillance program. It is not intended to diagnose MRSA infection nor to guide or monitor treatment for MRSA infections. Performed at [facility], [address], [place], [place] [address]          Radiology Studies: Dg Chest 1 View  Result Date: 01/03/2019 CLINICAL DATA:  Fall and pain. EXAM: CHEST  1 VIEW COMPARISON:  01/03/2018 and prior radiographs FINDINGS: Cardiomegaly and pulmonary vascular congestion noted. Increased LEFT mid and lower opacities noted which may represent atelectasis, scarring or airspace disease. No definite pleural effusion noted. No pneumothorax. A LEFT sided Port-A-Cath is again noted. IMPRESSION: Cardiomegaly with pulmonary vascular congestion. Increased LEFT mid and lower lung opacities which may represent atelectasis,  scarring or possibly airspace disease. Electronically Signed   By: Harmon Pier M.D.   On: 01/03/2019 22:11   Dg Lumbar Spine Complete  Result Date: 01/03/2019 CLINICAL DATA:  Acute low back pain following fall. Initial encounter. EXAM: LUMBAR SPINE - COMPLETE 4+ VIEW COMPARISON:  11/10/2016 CT and prior studies FINDINGS: Five non rib-bearing lumbar type vertebra are again identified. No acute fracture subluxation. Posterior rod/bipedicular screw and interbody fusion at L3-L4-L5 again noted without significant change. Very mild remote compression fracture of the L1 SUPERIOR endplate identified. Multilevel degenerative changes are again noted with moderate to severe degenerative disc disease at L2-3. Aortic atherosclerotic calcifications are present. IMPRESSION: 1. No evidence of acute intracranial abnormality 2. L3-L5 fusion, multilevel degenerative disc disease and mild remote L1 SUPERIOR endplate compression fracture. Electronically Signed   By: Harmon Pier M.D.   On: 01/03/2019 22:15   Ct Head Wo Contrast  Result Date: 01/03/2019 CLINICAL DATA:  Larey Seat this morning, found down. History of frequent falls, stroke, breast cancer, hypertension and hyperlipidemia. EXAM: CT HEAD WITHOUT CONTRAST CT CERVICAL SPINE WITHOUT CONTRAST TECHNIQUE: Multidetector CT imaging of the head and cervical spine was performed following the standard protocol without intravenous contrast. Multiplanar CT image reconstructions of the cervical spine were also generated. COMPARISON:  CT HEAD January 03, 2018 and CT cervical spine December 02, 2011 FINDINGS: CT HEAD FINDINGS BRAIN: No intraparenchymal hemorrhage, mass effect nor midline shift. No parenchymal brain volume loss for age. No hydrocephalus. Patchy supratentorial white matter hypodensities less than expected for patient's age, though non-specific are most compatible with chronic small vessel ischemic disease. 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 No acute  large vascular territory infarcts. No abnormal extra-axial fluid collections. Basal cisterns are patent. VASCULAR: Moderate to severe calcific atherosclerosis of the carotid siphons. SKULL: No skull fracture. No significant scalp soft tissue swelling. SINUSES/ORBITS: RIGHT sphenoid sinus mucosal thickening unchanged. Mastoid air cells are well aerated.The included ocular globes and orbital contents are non-suspicious. OTHER: None. CT CERVICAL SPINE FINDINGS ALIGNMENT: Maintained lordosis. Grade 1 C4-5 through C7-T1 anterolisthesis. Mild levoscoliosis mid cervical spine. SKULL BASE AND VERTEBRAE: Cervical vertebral bodies and posterior elements are intact. Severe RIGHT upper cervical facet arthropathy. SOFT TISSUES AND SPINAL CANAL: Nonacute. Moderate calcific atherosclerosis carotid bifurcations. DISC LEVELS: No severe osseous canal stenosis or neural foraminal narrowing. UPPER CHEST: Lung apices are clear. OTHER: None. IMPRESSION: CT HEAD: 1. No acute intracranial process. 2. Stable examination including old small RIGHT basal ganglia and LEFT cerebellar infarcts. CT CERVICAL SPINE: 1. No fracture. 2. Grade 1 C4-5 through C7-T1 spondylolisthesis on degenerative basis. Electronically Signed   By: Awilda Metro M.D.   On: 01/03/2019 22:10   Ct Cervical Spine Wo Contrast  Result Date: 01/03/2019 CLINICAL DATA:  Larey Seat this morning, found down. History of frequent falls, stroke, breast cancer, hypertension and hyperlipidemia. EXAM: CT HEAD WITHOUT CONTRAST CT CERVICAL SPINE WITHOUT CONTRAST TECHNIQUE: Multidetector CT imaging of the head and cervical spine was performed following the standard protocol without intravenous contrast. Multiplanar CT image reconstructions of the cervical spine were also generated. COMPARISON:  CT HEAD January 03, 2018 and CT cervical spine December 02, 2011 FINDINGS: CT HEAD FINDINGS BRAIN: No intraparenchymal hemorrhage, mass effect nor midline shift. No parenchymal brain volume loss  for age. No hydrocephalus. Patchy supratentorial white matter hypodensities less than expected for patient's age, though non-specific are most compatible with chronic small vessel ischemic disease. 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 No acute large vascular territory infarcts. No abnormal extra-axial fluid collections. Basal cisterns are patent. VASCULAR: Moderate to severe calcific atherosclerosis of the carotid siphons. SKULL: No skull fracture. No significant scalp soft tissue swelling. SINUSES/ORBITS: RIGHT sphenoid sinus mucosal thickening unchanged. Mastoid air cells are well aerated.The included ocular globes and orbital contents are non-suspicious. OTHER: None. CT CERVICAL SPINE FINDINGS ALIGNMENT: Maintained lordosis. Grade 1 C4-5 through C7-T1 anterolisthesis. Mild levoscoliosis mid cervical spine. SKULL BASE AND VERTEBRAE: Cervical vertebral bodies and posterior elements are intact. Severe RIGHT upper cervical facet arthropathy. SOFT TISSUES AND SPINAL CANAL: Nonacute. Moderate calcific atherosclerosis carotid bifurcations. DISC LEVELS: No severe osseous canal stenosis or neural foraminal narrowing. UPPER CHEST: Lung apices are clear. OTHER: None. IMPRESSION: CT HEAD: 1. No acute intracranial process. 2. Stable examination including old small RIGHT basal ganglia and LEFT cerebellar infarcts. CT CERVICAL SPINE: 1. No fracture. 2. Grade 1 C4-5 through C7-T1 spondylolisthesis on degenerative basis. Electronically Signed   By: Awilda Metro M.D.   On: 01/03/2019 22:10        Scheduled Meds: . aspirin EC  81 mg Oral Daily  . enoxaparin (LOVENOX) injection  40 mg Subcutaneous Q24H  . glipiZIDE  10 mg Oral BID AC  . insulin aspart  0-15 Units Subcutaneous TID WC  . insulin glargine  25 Units Subcutaneous QHS  . linagliptin  5 mg Oral Daily  . metoprolol tartrate  25 mg Oral BID  . pantoprazole  40 mg Oral Daily  . PARoxetine  40 mg Oral Daily  . pregabalin  50 mg Oral  TID  . raloxifene  60 mg Oral Daily  . rosuvastatin  40 mg Oral QHS   Continuous Infusions: . sodium chloride 75 mL/hr at 01/05/19 0326  . cefTRIAXone (ROCEPHIN)  IV 2 g (01/04/19 2213)     LOS: 2 days    Time spent: 30 minutes    Drevon Plog Hoover Brunette, DO [facility] Pager [phone]  If 7PM-7AM, please contact night-coverage [email or web address] Password [email or web address] 01/05/2019, 9:12 AM

## 2019-01-05 NOTE — Clinical Social Work Note (Signed)
Clinical Social Work Assessment  Patient Details  Name: [name] MRN: [id number] Date of Birth: 04-16-1933  Date of referral:  01/05/19               Reason for consult:  Facility Placement                Permission sought to share information with:    Permission granted to share information::     Name::        Agency::     Relationship::     Contact Information:     Housing/Transportation Living arrangements for the past 2 months:  Single Family Home Source of Information:  Patient Patient Interpreter Needed:  None Criminal Activity/Legal Involvement Pertinent to Current Situation/Hospitalization:  No - Comment as needed Significant Relationships:  Adult Children Lives with:  Self Do you feel safe going back to the place where you live?  Yes Need for family participation in patient care:  Yes (Comment)  Care giving concerns:  None identified.    Social Worker assessment / plan:  At baseline patient uses a wheelchair.  She is independent in ADLs.  She stated that she does some cooking.  Patient is agreeable to short term rehab at [facility].   Employment status:  Retired Nurse, adult PT Recommendations:  [place] / Referral to community resources:  [place]  Patient/Family's Response to care:  Patient is agreeable to short term rehab at [facility].   Patient/Family's Understanding of and Emotional Response to Diagnosis, Current Treatment, and Prognosis:  Patient understands her diagnosis, treatment and prognosis and feels short term rehab is most appropriate.   Emotional Assessment Appearance:  Appears stated age Attitude/Demeanor/Rapport:    Affect (typically observed):  Accepting, Calm Orientation:  Oriented to Self, Oriented to Place, Oriented to Situation, Oriented to  Time Alcohol / Substance use:  Not Applicable Psych involvement (Current and /or in the community):  No (Comment)  Discharge Needs   Concerns to be addressed:  Discharge Planning Concerns Readmission within the last 30 days:  No Current discharge risk:  None Barriers to Discharge:  [place], LCSW 01/05/2019, 3:29 PM

## 2019-01-05 NOTE — Progress Notes (Signed)
Patients heart rate sustaining 120's in Afib. Dr. Olevia Bowens notified. New orders placed. Will continue to monitor.

## 2019-01-05 NOTE — Plan of Care (Signed)
  Problem: Acute Rehab PT Goals(only PT should resolve) Goal: Pt Will Go Supine/Side To Sit Outcome: Progressing Flowsheets (Taken 01/05/2019 1239) Pt will go Supine/Side to Sit: with modified independence Goal: Patient Will Transfer Sit To/From Stand Outcome: Progressing Flowsheets (Taken 01/05/2019 1239) Patient will transfer sit to/from stand: with min guard assist Goal: Pt Will Transfer Bed To Chair/Chair To Bed Outcome: Progressing Flowsheets (Taken 01/05/2019 1239) Pt will Transfer Bed to Chair/Chair to Bed: min guard assist Goal: Pt Will Ambulate Outcome: Progressing Flowsheets (Taken 01/05/2019 1239) Pt will Ambulate: 25 feet; with minimal assist; with rolling walker   12:39 PM, 01/05/19 [name], MPT Physical Therapist with [facility] [phone] office [phone] mobile phone

## 2019-01-05 NOTE — Care Management Important Message (Signed)
Important Message  Patient Details  Name: [name] MRN: [id number] Date of Birth: 12/30/1932   Medicare Important Message Given:  Yes    Tommy Medal 01/05/2019, 11:23 AM

## 2019-01-05 NOTE — Evaluation (Signed)
Physical Therapy Evaluation Patient Details Name: [name] MRN: [id number] DOB: 1933/11/29 Today's Date: 01/05/2019   History of Present Illness  [name] is a 85 y.o. female with medical history significant of anemia, aortic stenosis, osteoarthritis, history of asthma, history of atrial flutter/fibrillation, B12 deficiency, breast cancer, cerebrovascular disease, depression, type 2 diabetes, diabetic retinopathy, frequent falls is coming to the emergency department after having difficulty getting him off from the floor since 10 in the morning.  She states she did not fall, she was just unable to have her legs work after she was trying to back up the stairs with a package.  She complains that she has not been progressively weaker for the past month.  She denies fever, chills, but feels fatigued.  Denies dyspnea, wheezing, hemoptysis, chest pain, palpitations, dizziness, diaphoresis, PND, orthopnea, but get occasional lower extremity edema.  She denies abdominal pain, nausea or vomiting, diarrhea or constipation, melena or hematochezia.  No dysuria, frequency or hematuria.  Denies polyuria, polydipsia, polyphagia or blurred vision.    Clinical Impression  Patient functioning well below baseline and limited for functional mobility and giat as stated below secondary to BLE weakness, fatigue and poor standing balance, limited to a few side steps at bedside and tolerated sitting up in chair after therapy.  Patient will benefit from continued physical therapy in hospital and recommended venue below to increase strength, balance, endurance for safe ADLs and gait.    Follow Up Recommendations SNF    Equipment Recommendations  None recommended by PT    Recommendations for Other Services       Precautions / Restrictions Precautions Precautions: Fall Restrictions Weight Bearing Restrictions: No      Mobility  Bed Mobility Overal bed mobility: Needs Assistance Bed Mobility: Supine to Sit      Supine to sit: Min guard     General bed mobility comments: increased time, labored movement  Transfers Overall transfer level: Needs assistance Equipment used: Rolling walker (2 wheeled) Transfers: Sit to/from [facility] Sit to Stand: Min assist;Mod assist Stand pivot transfers: Mod assist       General transfer comment: slow labored movement  Ambulation/Gait Ambulation/Gait assistance: Mod assist;Max assist Gait Distance (Feet): 4 Feet Assistive device: Rolling walker (2 wheeled) Gait Pattern/deviations: Decreased step length - right;Decreased step length - left;Decreased stride length Gait velocity: slow   General Gait Details: limited to 4-5 slow labored side steps due to generalized weakness, fair/poor standing balance  Stairs            Wheelchair Mobility    Modified Rankin (Stroke Patients Only)       Balance Overall balance assessment: Needs assistance Sitting-balance support: Feet supported;No upper extremity supported Sitting balance-Leahy Scale: Good     Standing balance support: Bilateral upper extremity supported;During functional activity Standing balance-Leahy Scale: Fair Standing balance comment: using RW                             Pertinent Vitals/Pain Pain Assessment: 0-10 Pain Score: 7  Pain Location: low back and right side of lower trunk Pain Descriptors / Indicators: Sore;Aching Pain Intervention(s): Limited activity within patient's tolerance;Monitored during session    Home Living Family/patient expects to be discharged to:: Private residence Living Arrangements: Alone Available Help at Discharge: Family;Available PRN/intermittently;Other (Comment)(has limited help per patient) Type of Home: Mobile home Home Access: Stairs to enter   Entrance Stairs-Number of Steps: 5 Home Layout: One  level Home Equipment: Wheelchair - [employer] - 2 wheels;Shower seat;Bedside commode;Hospital  bed Additional Comments: states her hospital bed is old and not able to use    Prior Function Level of Independence: Independent with assistive device(s);Needs assistance   Gait / Transfers Assistance Needed: uses wheelchair mostly in house, can ambulate short household distances using RW  ADL's / Homemaking Assistance Needed: family assist with community ADLs        Hand Dominance        Extremity/Trunk Assessment   Upper Extremity Assessment Upper Extremity Assessment: Generalized weakness    Lower Extremity Assessment Lower Extremity Assessment: Generalized weakness    Cervical / Trunk Assessment Cervical / Trunk Assessment: Normal  Communication   Communication: No difficulties  Cognition Arousal/Alertness: Awake/alert Behavior During Therapy: WFL for tasks assessed/performed Overall Cognitive Status: Within Functional Limits for tasks assessed                                        General Comments      Exercises     Assessment/Plan    PT Assessment Patient needs continued PT services  PT Problem List Decreased strength;Decreased activity tolerance;Decreased balance;Decreased mobility       PT Treatment Interventions Gait training;Stair training;Functional mobility training;Therapeutic activities;Patient/family education;Therapeutic exercise    PT Goals (Current goals can be found in the Care Plan section)  Acute Rehab PT Goals Patient Stated Goal: return home after rehab PT Goal Formulation: With patient Time For Goal Achievement: 01/19/19 Potential to Achieve Goals: Good    Frequency Min 3X/week   Barriers to discharge        Co-evaluation               AM-PAC PT "6 Clicks" Mobility  Outcome Measure Help needed turning from your back to your side while in a flat bed without using bedrails?: None Help needed moving from lying on your back to sitting on the side of a flat bed without using bedrails?: A Little Help needed  moving to and from a bed to a chair (including a wheelchair)?: A Lot Help needed standing up from a chair using your arms (e.g., wheelchair or bedside chair)?: A Lot Help needed to walk in hospital room?: A Lot Help needed climbing 3-5 steps with a railing? : Total 6 Click Score: 14    End of Session Equipment Utilized During Treatment: Gait belt Activity Tolerance: Patient tolerated treatment well;Patient limited by fatigue Patient left: in chair;with call bell/phone within reach Nurse Communication: Mobility status PT Visit Diagnosis: Unsteadiness on feet (R26.81);Other abnormalities of gait and mobility (R26.89);Muscle weakness (generalized) (M62.81)    Time: 6301-6010 PT Time Calculation (min) (ACUTE ONLY): 25 min   Charges:   PT Evaluation $PT Eval Moderate Complexity: 1 Mod          12:38 PM, 01/05/19 [name], MPT Physical Therapist with [facility] [phone] office [phone] mobile phone w

## 2019-01-06 LAB — GLUCOSE, CAPILLARY
GLUCOSE-CAPILLARY: 246 mg/dL — AB (ref 70–99)
Glucose-Capillary: 204 mg/dL — ABNORMAL HIGH (ref 70–99)
Glucose-Capillary: 188 mg/dL — ABNORMAL HIGH (ref 70–99)
Glucose-Capillary: 158 mg/dL — ABNORMAL HIGH (ref 70–99)

## 2019-01-06 LAB — CBC WITH DIFFERENTIAL/PLATELET
ABS IMMATURE GRANULOCYTES: 0.03 10*3/uL (ref 0.00–0.07)
Basophils Absolute: 0.1 10*3/uL (ref 0.0–0.1)
Basophils Relative: 1 %
Eosinophils Absolute: 0.4 10*3/uL (ref 0.0–0.5)
Eosinophils Relative: 4 %
HEMATOCRIT: 36.2 % (ref 36.0–46.0)
Hemoglobin: 10.2 g/dL — ABNORMAL LOW (ref 12.0–15.0)
Immature Granulocytes: 0 %
Lymphocytes Relative: 19 %
Lymphs Abs: 1.6 10*3/uL (ref 0.7–4.0)
MCH: 24.5 pg — ABNORMAL LOW (ref 26.0–34.0)
MCHC: 28.2 g/dL — ABNORMAL LOW (ref 30.0–36.0)
MCV: 87 fL (ref 80.0–100.0)
MONO ABS: 0.6 10*3/uL (ref 0.1–1.0)
Monocytes Relative: 7 %
Neutro Abs: 5.9 10*3/uL (ref 1.7–7.7)
Neutrophils Relative %: 69 %
Platelets: 208 10*3/uL (ref 150–400)
RBC: 4.16 MIL/uL (ref 3.87–5.11)
RDW: 16.6 % — ABNORMAL HIGH (ref 11.5–15.5)
WBC: 8.5 10*3/uL (ref 4.0–10.5)
nRBC: 0 % (ref 0.0–0.2)

## 2019-01-06 MED ORDER — CIPROFLOXACIN HCL 250 MG PO TABS
250.0000 mg | ORAL_TABLET | Freq: Two times a day (BID) | ORAL | Status: DC
  Administered 2019-01-06 – 2019-01-08 (×4): 250 mg via ORAL
  Filled 2019-01-06 (×4): qty 1

## 2019-01-06 MED ORDER — CIPROFLOXACIN HCL 500 MG PO TABS: 500.0000 mg | ORAL_TABLET | Freq: Two times a day (BID) | ORAL | 0 refills | Status: AC

## 2019-01-06 MED ORDER — METOPROLOL TARTRATE 25 MG PO TABS: 25.0000 mg | ORAL_TABLET | Freq: Two times a day (BID) | ORAL | 0 refills | Status: DC

## 2019-01-06 NOTE — Discharge Summary (Signed)
Physician Discharge Summary  [name] CXA:[id number] DOB: Aug 07, 1933 DOA: 01/03/2019  PCP: Lauren Nevins, MD  Admit date: 01/03/2019  Discharge date: 01/06/2019  Admitted From:Home  Disposition:  SNF  Recommendations for Outpatient Follow-up:  1. Follow up with PCP in 1-2 weeks 2. Continue on ciprofloxacin for Klebsiella pneumoniae UTI as prescribed for 3 more days to complete course of treatment 3. Continue metoprolol as noted for rate control.  Home Health: None  Equipment/Devices: None  Discharge Condition: Stable  CODE STATUS: Full  Diet recommendation: Heart Healthy/carb modified  Brief/Interim Summary: Per HPI: [name] a 85 y.o.femalewith medical history significant ofanemia, aortic stenosis, osteoarthritis, history of asthma, history of atrial flutter/fibrillation, B12 deficiency, breast cancer, cerebrovascular disease, depression, type 2 diabetes, diabetic retinopathy, frequent falls is coming to the emergency department after having difficulty getting him off from the floor since 10 in the morning. She states she did not fall, she was just unable to have her legs work after she was trying to back up the stairs with a package. She complains that she has not been progressively weaker for the past month. She denies fever, chills, but feels fatigued. Denies dyspnea, wheezing, hemoptysis, chest pain, palpitations, dizziness, diaphoresis, PND, orthopnea, but get occasional lower extremity edema. She denies abdominal pain, nausea or vomiting, diarrhea or constipation, melena or hematochezia. No dysuria, frequency or hematuria. Denies polyuria, polydipsia, polyphagia or blurred vision.  Patient was admitted for worsening weakness and higher frequency of falls with noted acute lower UTI for which she has been started on Rocephin, but now transition to oral ciprofloxacin for Klebsiella pneumonia that has been seen in urine cultures.  She will require a total 7-day  course of treatment and there for 3 more days on ciprofloxacin.  She has been seen by physical therapy with recommendations for inpatient rehabilitation.  During this course of admission, she did have some atrial fibrillation with RVR which was treated with IV metoprolol with resolution.  She has been started on some metoprolol to help with rate control which she was not taking previously.  She does continue with anticoagulation using low-dose aspirin on account of recurrent falls.  No other acute events have been noted throughout the course of this admission.  Discharge Diagnoses:  Principal Problem:   Acute lower UTI (urinary tract infection) Active Problems:   Hyperlipidemia   DEPRESSION/ANXIETY   Atrial fibrillation (HCC)   Anemia   Essential hypertension   Atrial flutter (HCC)   Type 2 diabetes mellitus (HCC)   CKD stage 3 due to type 2 diabetes mellitus (HCC)  Principal discharge diagnosis: Worsening weakness secondary to acute Klebsiella pneumonia UTI.  Discharge Instructions  Discharge Instructions    Diet - low sodium heart healthy   Complete by:  As directed    Increase activity slowly   Complete by:  As directed      Allergies as of 01/06/2019      Reactions   Mold Extract [trichophyton Mentagrophyte] Other (See Comments)   Reaction:  Itchy, watery eyes/sneezing    Morphine And Related Other (See Comments)   Reaction:  Drowsiness. "I didn't wake up for almost 1 week" after taking morphine.    Niaspan [niacin] Other (See Comments)   Reaction:  Flushing    Pollen Extract Other (See Comments)   Reaction:  Itchy, watery eyes/sneezing       Medication List    TAKE these medications   albuterol 108 (90 Base) MCG/ACT inhaler Commonly known as:  PROAIR  HFA Inhale 2 puffs into the lungs every 6 (six) hours as needed for wheezing or shortness of breath.   aspirin EC 81 MG tablet Take 81 mg by mouth daily.   ciprofloxacin 500 MG tablet Commonly known as:  CIPRO Take 1  tablet (500 mg total) by mouth 2 (two) times daily for 3 days.   clonazePAM 0.5 MG tablet Commonly known as:  KLONOPIN Take 0.5 mg by mouth daily as needed for anxiety.   cyanocobalamin 1000 MCG/ML injection Commonly known as:  (VITAMIN B-12) Inject 1,000 mcg into the muscle every 30 (thirty) days.   Diclofenac Sodium 3 % Gel Apply 1 application topically 3 (three) times daily.   esomeprazole 40 MG capsule Commonly known as:  NEXIUM Take 40 mg by mouth daily.   fenofibrate 145 MG tablet Commonly known as:  TRICOR Take 145 mg by mouth daily.   furosemide 40 MG tablet Commonly known as:  LASIX Take 1 tablet (40 mg total) by mouth daily. What changed:    how much to take  when to take this   glipiZIDE 10 MG tablet Commonly known as:  GLUCOTROL Take 10 mg by mouth 2 (two) times daily before a meal.   HYDROcodone-acetaminophen 10-325 MG tablet Commonly known as:  NORCO Take 1 tablet by mouth 2 (two) times daily as needed for moderate pain.   insulin aspart 100 UNIT/ML FlexPen Commonly known as:  NOVOLOG FLEXPEN Inject 8 Units into the skin 3 (three) times daily with meals. What changed:    how much to take  when to take this   Insulin Degludec 200 UNIT/ML Sopn Commonly known as:  TRESIBA FLEXTOUCH Inject 12 Units into the skin at bedtime. What changed:  how much to take   loperamide 2 MG capsule Commonly known as:  IMODIUM Take 2 mg by mouth as needed for diarrhea or loose stools.   metoprolol tartrate 25 MG tablet Commonly known as:  LOPRESSOR Take 1 tablet (25 mg total) by mouth 2 (two) times daily for 30 days.   PARoxetine 40 MG tablet Commonly known as:  PAXIL Take 40 mg by mouth daily.   potassium chloride SA 20 MEQ tablet Commonly known as:  K-DUR,KLOR-CON Take 20 mEq by mouth daily as needed (for low potassium).   pregabalin 50 MG capsule Commonly known as:  LYRICA Take 50 mg by mouth 3 (three) times daily.   raloxifene 60 MG tablet Commonly  known as:  EVISTA Take 60 mg by mouth daily.   rosuvastatin 40 MG tablet Commonly known as:  CRESTOR Take 1 tablet (40 mg total) by mouth at bedtime.   sitaGLIPtin 25 MG tablet Commonly known as:  JANUVIA Take 25 mg by mouth daily.      Follow-up Information    Lauren Nevins, MD Follow up in 1 week(s).   Specialty:  Internal Medicine Contact information: [address] [place] [address] [phone]          Allergies  Allergen Reactions  . Mold Extract [Trichophyton Mentagrophyte] Other (See Comments)    Reaction:  Itchy, watery eyes/sneezing   . Morphine And Related Other (See Comments)    Reaction:  Drowsiness. "I didn't wake up for almost 1 week" after taking morphine.   . Niaspan [Niacin] Other (See Comments)    Reaction:  Flushing   . Pollen Extract Other (See Comments)    Reaction:  Itchy, watery eyes/sneezing     Consultations:  None   Procedures/Studies: Dg Chest 1 View  Result Date: 01/03/2019 CLINICAL DATA:  Fall and pain. EXAM: CHEST  1 VIEW COMPARISON:  01/03/2018 and prior radiographs FINDINGS: Cardiomegaly and pulmonary vascular congestion noted. Increased LEFT mid and lower opacities noted which may represent atelectasis, scarring or airspace disease. No definite pleural effusion noted. No pneumothorax. A LEFT sided Port-A-Cath is again noted. IMPRESSION: Cardiomegaly with pulmonary vascular congestion. Increased LEFT mid and lower lung opacities which may represent atelectasis, scarring or possibly airspace disease. Electronically Signed   By: Margarette Canada M.D.   On: 01/03/2019 22:11   Dg Lumbar Spine Complete  Result Date: 01/03/2019 CLINICAL DATA:  Acute low back pain following fall. Initial encounter. EXAM: LUMBAR SPINE - COMPLETE 4+ VIEW COMPARISON:  11/10/2016 CT and prior studies FINDINGS: Five non rib-bearing lumbar type vertebra are again identified. No acute fracture subluxation. Posterior rod/bipedicular screw and interbody fusion  at L3-L4-L5 again noted without significant change. Very mild remote compression fracture of the L1 SUPERIOR endplate identified. Multilevel degenerative changes are again noted with moderate to severe degenerative disc disease at L2-3. Aortic atherosclerotic calcifications are present. IMPRESSION: 1. No evidence of acute intracranial abnormality 2. L3-L5 fusion, multilevel degenerative disc disease and mild remote L1 SUPERIOR endplate compression fracture. Electronically Signed   By: Margarette Canada M.D.   On: 01/03/2019 22:15   Ct Head Wo Contrast  Result Date: 01/03/2019 CLINICAL DATA:  [place] this morning, found down. History of frequent falls, stroke, breast cancer, hypertension and hyperlipidemia. EXAM: CT HEAD WITHOUT CONTRAST CT CERVICAL SPINE WITHOUT CONTRAST TECHNIQUE: Multidetector CT imaging of the head and cervical spine was performed following the standard protocol without intravenous contrast. Multiplanar CT image reconstructions of the cervical spine were also generated. COMPARISON:  CT HEAD January 03, 2018 and CT cervical spine December 02, 2011 FINDINGS: CT HEAD FINDINGS BRAIN: No intraparenchymal hemorrhage, mass effect nor midline shift. No parenchymal brain volume loss for age. No hydrocephalus. Patchy supratentorial white matter hypodensities less than expected for patient's age, though non-specific are most compatible with chronic small vessel ischemic disease. 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 No acute large vascular territory infarcts. No abnormal extra-axial fluid collections. Basal cisterns are patent. VASCULAR: Moderate to severe calcific atherosclerosis of the carotid siphons. SKULL: No skull fracture. No significant scalp soft tissue swelling. SINUSES/ORBITS: RIGHT sphenoid sinus mucosal thickening unchanged. Mastoid air cells are well aerated.The included ocular globes and orbital contents are non-suspicious. OTHER: None. CT CERVICAL SPINE FINDINGS ALIGNMENT:  Maintained lordosis. Grade 1 C4-5 through C7-T1 anterolisthesis. Mild levoscoliosis mid cervical spine. SKULL BASE AND VERTEBRAE: Cervical vertebral bodies and posterior elements are intact. Severe RIGHT upper cervical facet arthropathy. SOFT TISSUES AND SPINAL CANAL: Nonacute. Moderate calcific atherosclerosis carotid bifurcations. DISC LEVELS: No severe osseous canal stenosis or neural foraminal narrowing. UPPER CHEST: Lung apices are clear. OTHER: None. IMPRESSION: CT HEAD: 1. No acute intracranial process. 2. Stable examination including old small RIGHT basal ganglia and LEFT cerebellar infarcts. CT CERVICAL SPINE: 1. No fracture. 2. Grade 1 C4-5 through C7-T1 spondylolisthesis on degenerative basis. Electronically Signed   By: Elon Alas M.D.   On: 01/03/2019 22:10   Ct Cervical Spine Wo Contrast  Result Date: 01/03/2019 CLINICAL DATA:  [place] this morning, found down. History of frequent falls, stroke, breast cancer, hypertension and hyperlipidemia. EXAM: CT HEAD WITHOUT CONTRAST CT CERVICAL SPINE WITHOUT CONTRAST TECHNIQUE: Multidetector CT imaging of the head and cervical spine was performed following the standard protocol without intravenous contrast. Multiplanar CT image reconstructions of the cervical spine  were also generated. COMPARISON:  CT HEAD January 03, 2018 and CT cervical spine December 02, 2011 FINDINGS: CT HEAD FINDINGS BRAIN: No intraparenchymal hemorrhage, mass effect nor midline shift. No parenchymal brain volume loss for age. No hydrocephalus. Patchy supratentorial white matter hypodensities less than expected for patient's age, though non-specific are most compatible with chronic small vessel ischemic disease. 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 No acute large vascular territory infarcts. No abnormal extra-axial fluid collections. Basal cisterns are patent. VASCULAR: Moderate to severe calcific atherosclerosis of the carotid siphons. SKULL: No skull fracture.  No significant scalp soft tissue swelling. SINUSES/ORBITS: RIGHT sphenoid sinus mucosal thickening unchanged. Mastoid air cells are well aerated.The included ocular globes and orbital contents are non-suspicious. OTHER: None. CT CERVICAL SPINE FINDINGS ALIGNMENT: Maintained lordosis. Grade 1 C4-5 through C7-T1 anterolisthesis. Mild levoscoliosis mid cervical spine. SKULL BASE AND VERTEBRAE: Cervical vertebral bodies and posterior elements are intact. Severe RIGHT upper cervical facet arthropathy. SOFT TISSUES AND SPINAL CANAL: Nonacute. Moderate calcific atherosclerosis carotid bifurcations. DISC LEVELS: No severe osseous canal stenosis or neural foraminal narrowing. UPPER CHEST: Lung apices are clear. OTHER: None. IMPRESSION: CT HEAD: 1. No acute intracranial process. 2. Stable examination including old small RIGHT basal ganglia and LEFT cerebellar infarcts. CT CERVICAL SPINE: 1. No fracture. 2. Grade 1 C4-5 through C7-T1 spondylolisthesis on degenerative basis. Electronically Signed   By: Elon Alas M.D.   On: 01/03/2019 22:10     Discharge Exam: Vitals:   01/05/19 2139 01/06/19 0636  BP: (!) 164/92 126/76  Pulse: 100 (!) 105  Resp: 17 16  Temp: 98.3 F (36.8 C) 98.3 F (36.8 C)  SpO2: 100% 96%   Vitals:   01/05/19 1642 01/05/19 2113 01/05/19 2139 01/06/19 0636  BP: (!) 128/58 102/83 (!) 164/92 126/76  Pulse: (!) 112 (!) 112 100 (!) 105  Resp: $Remo'20  17 16  'bMIPg$ Temp: 98 F (36.7 C)  98.3 F (36.8 C) 98.3 F (36.8 C)  TempSrc: Oral  Oral Oral  SpO2: (!) 84%  100% 96%  Weight:      Height:        General: Pt is alert, awake, not in acute distress Cardiovascular: RRR, S1/S2 +, no rubs, no gallops Respiratory: CTA bilaterally, no wheezing, no rhonchi Abdominal: Soft, NT, ND, bowel sounds + Extremities: no edema, no cyanosis    The results of significant diagnostics from this hospitalization (including imaging, microbiology, ancillary and laboratory) are listed below for reference.      Microbiology: Recent Results (from the past 240 hour(s))  Urine culture     Status: Abnormal (Preliminary result)   Collection Time: 01/03/19  8:55 PM  Result Value Ref Range Status   Specimen Description   Final    URINE, CLEAN CATCH Performed at [facility], [address] [place], [place] [address]    Special Requests   Final    NONE Performed at [facility], [address], [place], [place] [address]    Culture (A)  Final    >=100,000 COLONIES/mL KLEBSIELLA PNEUMONIAE CULTURE REINCUBATED FOR BETTER GROWTH Performed at [facility], [place] [address], [place], [place] [address]    Report Status PENDING  Incomplete   Organism ID, Bacteria KLEBSIELLA PNEUMONIAE (A)  Final      Susceptibility   Klebsiella pneumoniae - MIC*    AMPICILLIN RESISTANT Resistant     CEFAZOLIN <=4 SENSITIVE Sensitive     CEFTRIAXONE <=1 SENSITIVE Sensitive     CIPROFLOXACIN <=0.25 SENSITIVE  Sensitive     GENTAMICIN <=1 SENSITIVE Sensitive     IMIPENEM <=0.25 SENSITIVE Sensitive     NITROFURANTOIN 64 INTERMEDIATE Intermediate     TRIMETH/SULFA <=20 SENSITIVE Sensitive     AMPICILLIN/SULBACTAM 4 SENSITIVE Sensitive     PIP/TAZO <=4 SENSITIVE Sensitive     Extended ESBL NEGATIVE Sensitive     * >=100,000 COLONIES/mL KLEBSIELLA PNEUMONIAE  MRSA PCR Screening     Status: None   Collection Time: 01/04/19 12:52 AM  Result Value Ref Range Status   MRSA by PCR NEGATIVE NEGATIVE Final    Comment:        The GeneXpert MRSA Assay (FDA approved for NASAL specimens only), is one component of a comprehensive MRSA colonization surveillance program. It is not intended to diagnose MRSA infection nor to guide or monitor treatment for MRSA infections. Performed at [facility], [address] [place], [place] [address]      Labs: BNP (last 3 results) No results for input(s): BNP in the last 8760 hours. Basic Metabolic Panel: Recent Labs  Lab 01/03/19 2121 01/04/19 0442 01/05/19 0522  NA  142 140 143  K 5.4* 5.1 5.0  CL 112* 113* 116*  CO2 $Re'22 22 22  'zYQ$ GLUCOSE 240* 311* 162*  BUN 41* 40* 38*  CREATININE 1.87* 1.82* 1.87*  CALCIUM 9.0 8.7* 9.1   Liver Function Tests: Recent Labs  Lab 01/03/19 2121  AST 12*  ALT 9  ALKPHOS 45  BILITOT 0.7  PROT 6.3*  ALBUMIN 3.5   No results for input(s): LIPASE, AMYLASE in the last 168 hours. No results for input(s): AMMONIA in the last 168 hours. CBC: Recent Labs  Lab 01/03/19 2121 01/04/19 0442 01/05/19 0522 01/06/19 0524  WBC 7.5 6.0 7.4 8.5  NEUTROABS 4.6 3.7 4.9 5.9  HGB 10.5* 9.7* 10.2* 10.2*  HCT 37.5 34.7* 35.8* 36.2  MCV 87.0 86.1 87.3 87.0  PLT 222 193 201 208   Cardiac Enzymes: Recent Labs  Lab 01/03/19 2121  CKTOTAL 84  TROPONINI <0.03   BNP: Invalid input(s): POCBNP CBG: Recent Labs  Lab 01/05/19 1117 01/05/19 1638 01/05/19 2140 01/06/19 0719 01/06/19 1132  GLUCAP 239* 114* 133* 204* 246*   D-Dimer No results for input(s): DDIMER in the last 72 hours. Hgb A1c No results for input(s): HGBA1C in the last 72 hours. Lipid Profile No results for input(s): CHOL, HDL, LDLCALC, TRIG, CHOLHDL, LDLDIRECT in the last 72 hours. Thyroid function studies No results for input(s): TSH, T4TOTAL, T3FREE, THYROIDAB in the last 72 hours.  Invalid input(s): FREET3 Anemia work up No results for input(s): VITAMINB12, FOLATE, FERRITIN, TIBC, IRON, RETICCTPCT in the last 72 hours. Urinalysis    Component Value Date/Time   COLORURINE YELLOW 01/03/2019 2055   APPEARANCEUR CLOUDY (A) 01/03/2019 2055   LABSPEC 1.023 01/03/2019 2055   PHURINE 5.0 01/03/2019 2055   GLUCOSEU 50 (A) 01/03/2019 2055   GLUCOSEU neg 04/07/2010   HGBUR MODERATE (A) 01/03/2019 2055   BILIRUBINUR NEGATIVE 01/03/2019 2055   KETONESUR NEGATIVE 01/03/2019 2055   PROTEINUR 100 (A) 01/03/2019 2055   UROBILINOGEN 0.2 09/27/2014 1630   NITRITE NEGATIVE 01/03/2019 2055   LEUKOCYTESUR MODERATE (A) 01/03/2019 2055   Sepsis Labs Invalid  input(s): PROCALCITONIN,  WBC,  LACTICIDVEN Microbiology Recent Results (from the past 240 hour(s))  Urine culture     Status: Abnormal (Preliminary result)   Collection Time: 01/03/19  8:55 PM  Result Value Ref Range Status   Specimen Description   Final    URINE,  CLEAN CATCH Performed at [facility], [address], [place], [place] [address]    Special Requests   Final    NONE Performed at [facility], [address], [place], [place] [address]    Culture (A)  Final    >=100,000 COLONIES/mL KLEBSIELLA PNEUMONIAE CULTURE REINCUBATED FOR BETTER GROWTH Performed at [facility], [place] [address] [place], [place] [address]    Report Status PENDING  Incomplete   Organism ID, Bacteria KLEBSIELLA PNEUMONIAE (A)  Final      Susceptibility   Klebsiella pneumoniae - MIC*    AMPICILLIN RESISTANT Resistant     CEFAZOLIN <=4 SENSITIVE Sensitive     CEFTRIAXONE <=1 SENSITIVE Sensitive     CIPROFLOXACIN <=0.25 SENSITIVE Sensitive     GENTAMICIN <=1 SENSITIVE Sensitive     IMIPENEM <=0.25 SENSITIVE Sensitive     NITROFURANTOIN 64 INTERMEDIATE Intermediate     TRIMETH/SULFA <=20 SENSITIVE Sensitive     AMPICILLIN/SULBACTAM 4 SENSITIVE Sensitive     PIP/TAZO <=4 SENSITIVE Sensitive     Extended ESBL NEGATIVE Sensitive     * >=100,000 COLONIES/mL KLEBSIELLA PNEUMONIAE  MRSA PCR Screening     Status: None   Collection Time: 01/04/19 12:52 AM  Result Value Ref Range Status   MRSA by PCR NEGATIVE NEGATIVE Final    Comment:        The GeneXpert MRSA Assay (FDA approved for NASAL specimens only), is one component of a comprehensive MRSA colonization surveillance program. It is not intended to diagnose MRSA infection nor to guide or monitor treatment for MRSA infections. Performed at [facility], [address], [place], [place] [address]      Time coordinating discharge: 35 minutes  SIGNED:   Rodena Goldmann, DO [facility] 01/06/2019, 12:15 PM  If 7PM-7AM, please  contact night-coverage [email or web address] Password [email or web address]

## 2019-01-06 NOTE — Progress Notes (Signed)
Physical Therapy Treatment Patient Details Name: [name] MRN: [id number] DOB: May 05, 1933 Today's Date: 01/06/2019    History of Present Illness [name] is a 85 y.o. female with medical history significant of anemia, aortic stenosis, osteoarthritis, history of asthma, history of atrial flutter/fibrillation, B12 deficiency, breast cancer, cerebrovascular disease, depression, type 2 diabetes, diabetic retinopathy, frequent falls is coming to the emergency department after having difficulty getting him off from the floor since 10 in the morning.  She states she did not fall, she was just unable to have her legs work after she was trying to back up the stairs with a package.  She complains that she has not been progressively weaker for the past month.  She denies fever, chills, but feels fatigued.  Denies dyspnea, wheezing, hemoptysis, chest pain, palpitations, dizziness, diaphoresis, PND, orthopnea, but get occasional lower extremity edema.  She denies abdominal pain, nausea or vomiting, diarrhea or constipation, melena or hematochezia.  No dysuria, frequency or hematuria.  Denies polyuria, polydipsia, polyphagia or blurred vision.    PT Comments    Patient presents supine in bed, pleasant and agreeable to therapy. Patient performs sit to stands from bed x3 reps using RW progressing from min assist to min guard assist with reps. Patient maintains static bil knee flexion with steps at bedside and during pivoting using RW despite education for knee extension and upright posture. Patient tolerates seated bil strengthening exercises with therapeutic rest breaks to recover. Patient with decreased active L knee extension during LAQ, patient reports the L leg is weaker from a past vein removal surgery. Patient left up in chair with BLE elevated and call bell in hand and educated to call nurse tech when wanting to return to bed; patient verbalized understanding. Patient will benefit from continued physical  therapy in hospital and recommended venue below to increase strength, balance, endurance for safe ADLs and gait.    Follow Up Recommendations  SNF     Equipment Recommendations  None recommended by PT    Recommendations for Other Services       Precautions / Restrictions Precautions Precautions: Fall Restrictions Weight Bearing Restrictions: No    Mobility  Bed Mobility Overal bed mobility: Needs Assistance Bed Mobility: Supine to Sit     Supine to sit: Min guard     General bed mobility comments: increased time, use of bedrail, elevated HOB  Transfers Overall transfer level: Needs assistance Equipment used: Rolling walker (2 wheeled) Transfers: Sit to/from [facility] Sit to Stand: Min assist;Min guard Stand pivot transfers: Min assist       General transfer comment: increased time, verbal cues for hand placement  Ambulation/Gait Ambulation/Gait assistance: Mod assist Gait Distance (Feet): 4 Feet Assistive device: Rolling walker (2 wheeled) Gait Pattern/deviations: Decreased step length - right;Decreased step length - left;Decreased stride length;Shuffle Gait velocity: decreased   General Gait Details: slow, labored 4-5 steps at bedside, static bil knee flexion throughout gait cycle   Stairs             Wheelchair Mobility    Modified Rankin (Stroke Patients Only)       Balance Overall balance assessment: Needs assistance Sitting-balance support: Feet supported;No upper extremity supported Sitting balance-Leahy Scale: Good Sitting balance - Comments: seated edge of bed   Standing balance support: Bilateral upper extremity supported;During functional activity Standing balance-Leahy Scale: Fair Standing balance comment: with RW  Cognition Arousal/Alertness: Awake/alert Behavior During Therapy: WFL for tasks assessed/performed Overall Cognitive Status: Within Functional Limits for tasks  assessed                                        Exercises General Exercises - Lower Extremity Long Arc Quad: Seated;AROM;Strengthening;Both;10 reps Hip Flexion/Marching: Seated;AROM;Strengthening;Both;10 reps Toe Raises: Seated;AROM;Strengthening;Both;10 reps Heel Raises: Seated;AROM;Strengthening;Both;10 reps    General Comments        Pertinent Vitals/Pain Pain Assessment: No/denies pain    Home Living                      Prior Function            PT Goals (current goals can now be found in the care plan section) Acute Rehab PT Goals Patient Stated Goal: return home after rehab PT Goal Formulation: With patient Time For Goal Achievement: 01/19/19 Potential to Achieve Goals: Good Progress towards PT goals: Progressing toward goals    Frequency    Min 3X/week      PT Plan Current plan remains appropriate    Co-evaluation              AM-PAC PT "6 Clicks" Mobility   Outcome Measure  Help needed turning from your back to your side while in a flat bed without using bedrails?: None Help needed moving from lying on your back to sitting on the side of a flat bed without using bedrails?: A Little Help needed moving to and from a bed to a chair (including a wheelchair)?: A Little Help needed standing up from a chair using your arms (e.g., wheelchair or bedside chair)?: A Lot Help needed to walk in hospital room?: A Lot Help needed climbing 3-5 steps with a railing? : Total 6 Click Score: 15    End of Session Equipment Utilized During Treatment: Gait belt Activity Tolerance: Patient tolerated treatment well;Patient limited by fatigue Patient left: in chair;with call bell/phone within reach Nurse Communication: Mobility status PT Visit Diagnosis: Unsteadiness on feet (R26.81);Other abnormalities of gait and mobility (R26.89);Muscle weakness (generalized) (M62.81)     Time: 2122-4825 PT Time Calculation (min) (ACUTE ONLY): 23  min  Charges:  $Therapeutic Exercise: 8-22 mins $Therapeutic Activity: 8-22 mins                     2:30 PM, 01/06/19 Talbot Grumbling, DPT Physical Therapist with [facility] [phone] office

## 2019-01-07 DIAGNOSIS — E785 Hyperlipidemia, unspecified: Secondary | ICD-10-CM

## 2019-01-07 DIAGNOSIS — D631 Anemia in chronic kidney disease: Secondary | ICD-10-CM

## 2019-01-07 DIAGNOSIS — E1122 Type 2 diabetes mellitus with diabetic chronic kidney disease: Secondary | ICD-10-CM

## 2019-01-07 DIAGNOSIS — N184 Chronic kidney disease, stage 4 (severe): Secondary | ICD-10-CM

## 2019-01-07 DIAGNOSIS — I4891 Unspecified atrial fibrillation: Secondary | ICD-10-CM

## 2019-01-07 DIAGNOSIS — I10 Essential (primary) hypertension: Secondary | ICD-10-CM

## 2019-01-07 DIAGNOSIS — N183 Chronic kidney disease, stage 3 (moderate): Secondary | ICD-10-CM

## 2019-01-07 LAB — GLUCOSE, CAPILLARY
Glucose-Capillary: 185 mg/dL — ABNORMAL HIGH (ref 70–99)
Glucose-Capillary: 209 mg/dL — ABNORMAL HIGH (ref 70–99)
Glucose-Capillary: 202 mg/dL — ABNORMAL HIGH (ref 70–99)
Glucose-Capillary: 162 mg/dL — ABNORMAL HIGH (ref 70–99)

## 2019-01-07 MED ORDER — METOPROLOL TARTRATE 25 MG PO TABS
37.5000 mg | ORAL_TABLET | Freq: Two times a day (BID) | ORAL | Status: DC
  Administered 2019-01-07 – 2019-01-08 (×2): 37.5 mg via ORAL
  Filled 2019-01-07 (×2): qty 2

## 2019-01-07 NOTE — Progress Notes (Signed)
PROGRESS NOTE    [name]  XBL:[id number] DOB: 1933/01/20 DOA: 01/03/2019 PCP: [name]   Brief Narrative:  Per HPI: [name] a 85 y.o.femalewith medical history significant ofanemia, aortic stenosis, osteoarthritis, history of asthma, history of atrial flutter/fibrillation, B12 deficiency, breast cancer, cerebrovascular disease, depression, type 2 diabetes, diabetic retinopathy, frequent falls is coming to the emergency department after having difficulty getting him off from the floor since 10 in the morning. She states she did not fall, she was just unable to have her legs work after she was trying to back up the stairs with a package. She complains that she has not been progressively weaker for the past month. She denies fever, chills, but feels fatigued. Denies dyspnea, wheezing, hemoptysis, chest pain, palpitations, dizziness, diaphoresis, PND, orthopnea, but get occasional lower extremity edema. She denies abdominal pain, nausea or vomiting, diarrhea or constipation, melena or hematochezia. No dysuria, frequency or hematuria. Denies polyuria, polydipsia, polyphagia or blurred vision.  Patient was admitted for worsening weakness and higher frequency of falls with noted acute lower UTI for which she has been started on Rocephin.  Assessment & Plan:   Principal Problem:   Acute lower UTI (urinary tract infection) Active Problems:   Hyperlipidemia   DEPRESSION/ANXIETY   Atrial fibrillation (HCC)   Anemia   Essential hypertension   Atrial flutter (HCC)   Type 2 diabetes mellitus ([name])   CKD stage 3 due to type 2 diabetes mellitus ([name])  1. Worsening weakness with increased frequency of falls secondary to acute lower UTI.    Treated with Rocephin and based on sensitivities, transitioned to ciprofloxacin. Seen by physical therapy with recommendations for skilled nursing facility placement. 2. Cardiomyopathy with LVEF 45 to 50%.  This will need further  evaluation in the outpatient setting as this is a change from previous.  No significant findings of valvular stenosis identified.  Not having chest pain at this time. 3. Depression/anxiety.  Continue on paroxetine and clonazepam. 4. Dyslipidemia.  Continue on Crestor 40 mg at bedtime. 5. Anemia.  Likely related to chronic kidney disease.  Hemoglobin stable. Continue to monitor H&H. 6. Essential hypertension.  Pressures currently stable.  Hydralazine added as needed. 7. CKD stage IV due to type 2 diabetes. Creatinine is currently stable.  Continue to follow.   8. Type 2 diabetes.  With noted hyperglycemia that has improved.  Continue on Lantus, glipizide, Tradjenta and SSI.  Will increase doses as needed. 9. Atrial fibrillation with RVR.  Without anticoagulation due to risk of falls.  Continue aspirin 81 mg daily.    2D echocardiogram with depressed LVEF noted.  Heart rate stable on beta-blockers..  Will need outpatient cardiology follow-up.   DVT prophylaxis: Lovenox Code Status: Full code Family Communication: None at bedside Disposition Plan:  Currently stable, awaiting insurance authorization to go to skilled nursing facility   Consultants:   None  Procedures:   None  Antimicrobials:   Rocephin 2/1-> 2/4  Ciprofloxacin 2/4 >   Subjective: Feels pretty good today.  No chest pain or shortness of breath.  Objective: Vitals:   01/06/19 0636 01/06/19 2209 01/07/19 0445 01/07/19 1435  BP: 126/76 137/87 (!) 110/100 113/69  Pulse: (!) 105 (!) 114 (!) 110 (!) 102  Resp: $Remo'16 17 18 19  'TSqfG$ Temp: 98.3 F (36.8 C) 98.5 F (36.9 C) 98.2 F (36.8 C) 98.3 F (36.8 C)  TempSrc: Oral Oral Oral Oral  SpO2: 96% 96% 95% 95%  Weight:  Height:        Intake/Output Summary (Last 24 hours) at 01/07/2019 1851 Last data filed at 01/07/2019 1700 Gross per 24 hour  Intake 720 ml  Output 1800 ml  Net -1080 ml   Filed Weights   01/03/19 2019 01/04/19 0012  Weight: 99.8 kg 111.5 kg     Examination:  General exam: Alert, awake, oriented x 3 Respiratory system: Clear to auscultation. Respiratory effort normal. Cardiovascular system:RRR. No murmurs, rubs, gallops.  1+ edema bilaterally Gastrointestinal system: Abdomen is nondistended, soft and nontender. No organomegaly or masses felt. Normal bowel sounds heard.  Data Reviewed: I have personally reviewed following labs and imaging studies  CBC: Recent Labs  Lab 01/03/19 2121 01/04/19 0442 01/05/19 0522 01/06/19 0524  WBC 7.5 6.0 7.4 8.5  NEUTROABS 4.6 3.7 4.9 5.9  HGB 10.5* 9.7* 10.2* 10.2*  HCT 37.5 34.7* 35.8* 36.2  MCV 87.0 86.1 87.3 87.0  PLT 222 193 201 664   Basic Metabolic Panel: Recent Labs  Lab 01/03/19 2121 01/04/19 0442 01/05/19 0522  NA 142 140 143  K 5.4* 5.1 5.0  CL 112* 113* 116*  CO2 $Re'22 22 22  'sCH$ GLUCOSE 240* 311* 162*  BUN 41* 40* 38*  CREATININE 1.87* 1.82* 1.87*  CALCIUM 9.0 8.7* 9.1   GFR: Estimated Creatinine Clearance: 31.3 mL/min (A) (by C-G formula based on SCr of 1.87 mg/dL (H)). Liver Function Tests: Recent Labs  Lab 01/03/19 2121  AST 12*  ALT 9  ALKPHOS 45  BILITOT 0.7  PROT 6.3*  ALBUMIN 3.5   No results for input(s): LIPASE, AMYLASE in the last 168 hours. No results for input(s): AMMONIA in the last 168 hours. Coagulation Profile: No results for input(s): INR, PROTIME in the last 168 hours. Cardiac Enzymes: Recent Labs  Lab 01/03/19 2121  CKTOTAL 84  TROPONINI <0.03   BNP (last 3 results) No results for input(s): PROBNP in the last 8760 hours. HbA1C: No results for input(s): HGBA1C in the last 72 hours. CBG: Recent Labs  Lab 01/06/19 1648 01/06/19 2210 01/07/19 0730 01/07/19 1106 01/07/19 1631  GLUCAP 188* 158* 185* 209* 202*   Lipid Profile: No results for input(s): CHOL, HDL, LDLCALC, TRIG, CHOLHDL, LDLDIRECT in the last 72 hours. Thyroid Function Tests: No results for input(s): TSH, T4TOTAL, FREET4, T3FREE, THYROIDAB in the last 72  hours. Anemia Panel: No results for input(s): VITAMINB12, FOLATE, FERRITIN, TIBC, IRON, RETICCTPCT in the last 72 hours. Sepsis Labs: Recent Labs  Lab 01/03/19 2121 01/03/19 2246  LATICACIDVEN 1.2 0.9    Recent Results (from the past 240 hour(s))  Urine culture     Status: Abnormal (Preliminary result)   Collection Time: 01/03/19  8:55 PM  Result Value Ref Range Status   Specimen Description   Final    URINE, CLEAN CATCH Performed at [facility], [address], [place], [place] [address]    Special Requests   Final    NONE Performed at [facility], [address] [place] [address] [place], [place] [address]    Culture (A)  Final    >=100,000 COLONIES/mL KLEBSIELLA PNEUMONIAE CULTURE REINCUBATED FOR BETTER GROWTH Performed at [facility] [address] [place], [place] [address]    Report Status PENDING  Incomplete   Organism ID, Bacteria KLEBSIELLA PNEUMONIAE (A)  Final      Susceptibility   Klebsiella pneumoniae - MIC*    AMPICILLIN RESISTANT Resistant     CEFAZOLIN <=4 SENSITIVE Sensitive     CEFTRIAXONE <=1 SENSITIVE Sensitive  CIPROFLOXACIN <=0.25 SENSITIVE Sensitive     GENTAMICIN <=1 SENSITIVE Sensitive     IMIPENEM <=0.25 SENSITIVE Sensitive     NITROFURANTOIN 64 INTERMEDIATE Intermediate     TRIMETH/SULFA <=20 SENSITIVE Sensitive     AMPICILLIN/SULBACTAM 4 SENSITIVE Sensitive     PIP/TAZO <=4 SENSITIVE Sensitive     Extended ESBL NEGATIVE Sensitive     * >=100,000 COLONIES/mL KLEBSIELLA PNEUMONIAE  MRSA PCR Screening     Status: None   Collection Time: 01/04/19 12:52 AM  Result Value Ref Range Status   MRSA by PCR NEGATIVE NEGATIVE Final    Comment:        The GeneXpert MRSA Assay (FDA approved for NASAL specimens only), is one component of a comprehensive MRSA colonization surveillance program. It is not intended to diagnose MRSA infection nor to guide or monitor treatment for MRSA infections. Performed at [facility], [address], [place],  [place] [address]          Radiology Studies: No results found.      Scheduled Meds: . aspirin EC  81 mg Oral Daily  . ciprofloxacin  250 mg Oral BID  . enoxaparin (LOVENOX) injection  40 mg Subcutaneous Q24H  . furosemide  40 mg Oral Daily  . glipiZIDE  10 mg Oral BID AC  . insulin aspart  0-15 Units Subcutaneous TID WC  . insulin glargine  25 Units Subcutaneous QHS  . linagliptin  5 mg Oral Daily  . metoprolol tartrate  25 mg Oral BID  . pantoprazole  40 mg Oral Daily  . PARoxetine  40 mg Oral Daily  . pregabalin  50 mg Oral TID  . raloxifene  60 mg Oral Daily  . rosuvastatin  40 mg Oral QHS   Continuous Infusions:    LOS: 4 days    Time spent: 30 minutes    Kathie Dike, [facility]  If 7PM-7AM, please contact night-coverage [email or web address]  01/07/2019, 6:51 PM

## 2019-01-08 DIAGNOSIS — D649 Anemia, unspecified: Secondary | ICD-10-CM | POA: Diagnosis not present

## 2019-01-08 DIAGNOSIS — M6281 Muscle weakness (generalized): Secondary | ICD-10-CM | POA: Diagnosis not present

## 2019-01-08 DIAGNOSIS — E119 Type 2 diabetes mellitus without complications: Secondary | ICD-10-CM | POA: Diagnosis not present

## 2019-01-08 DIAGNOSIS — R5381 Other malaise: Secondary | ICD-10-CM | POA: Diagnosis not present

## 2019-01-08 DIAGNOSIS — I1310 Hypertensive heart and chronic kidney disease without heart failure, with stage 1 through stage 4 chronic kidney disease, or unspecified chronic kidney disease: Secondary | ICD-10-CM | POA: Diagnosis not present

## 2019-01-08 DIAGNOSIS — E11319 Type 2 diabetes mellitus with unspecified diabetic retinopathy without macular edema: Secondary | ICD-10-CM | POA: Diagnosis not present

## 2019-01-08 DIAGNOSIS — Z7401 Bed confinement status: Secondary | ICD-10-CM | POA: Diagnosis not present

## 2019-01-08 DIAGNOSIS — R29898 Other symptoms and signs involving the musculoskeletal system: Secondary | ICD-10-CM | POA: Diagnosis not present

## 2019-01-08 DIAGNOSIS — L97911 Non-pressure chronic ulcer of unspecified part of right lower leg limited to breakdown of skin: Secondary | ICD-10-CM | POA: Diagnosis not present

## 2019-01-08 DIAGNOSIS — I10 Essential (primary) hypertension: Secondary | ICD-10-CM | POA: Diagnosis not present

## 2019-01-08 DIAGNOSIS — R69 Illness, unspecified: Secondary | ICD-10-CM | POA: Diagnosis not present

## 2019-01-08 DIAGNOSIS — E785 Hyperlipidemia, unspecified: Secondary | ICD-10-CM | POA: Diagnosis not present

## 2019-01-08 DIAGNOSIS — N183 Chronic kidney disease, stage 3 (moderate): Secondary | ICD-10-CM | POA: Diagnosis not present

## 2019-01-08 DIAGNOSIS — R2689 Other abnormalities of gait and mobility: Secondary | ICD-10-CM | POA: Diagnosis not present

## 2019-01-08 DIAGNOSIS — R197 Diarrhea, unspecified: Secondary | ICD-10-CM | POA: Diagnosis not present

## 2019-01-08 DIAGNOSIS — N390 Urinary tract infection, site not specified: Secondary | ICD-10-CM | POA: Diagnosis not present

## 2019-01-08 DIAGNOSIS — I4891 Unspecified atrial fibrillation: Secondary | ICD-10-CM | POA: Diagnosis not present

## 2019-01-08 DIAGNOSIS — R296 Repeated falls: Secondary | ICD-10-CM | POA: Diagnosis not present

## 2019-01-08 DIAGNOSIS — M797 Fibromyalgia: Secondary | ICD-10-CM | POA: Diagnosis not present

## 2019-01-08 LAB — URINE CULTURE

## 2019-01-08 LAB — GLUCOSE, CAPILLARY
Glucose-Capillary: 164 mg/dL — ABNORMAL HIGH (ref 70–99)
Glucose-Capillary: 173 mg/dL — ABNORMAL HIGH (ref 70–99)

## 2019-01-08 MED ORDER — HEPARIN SOD (PORK) LOCK FLUSH 100 UNIT/ML IV SOLN
500.0000 [IU] | INTRAVENOUS | Status: AC | PRN
  Administered 2019-01-08: 500 [IU]
  Filled 2019-01-08: qty 5

## 2019-01-08 MED ORDER — METOPROLOL TARTRATE 37.5 MG PO TABS: 37.5000 mg | ORAL_TABLET | Freq: Two times a day (BID) | ORAL | Status: DC

## 2019-01-08 NOTE — Progress Notes (Signed)
Patient left floor in stable condition for discharge to [facility] with EMS transport. Donavan Foil, RN

## 2019-01-08 NOTE — Progress Notes (Signed)
Patient discharging to [facility] SN[facility] today. Called report to Jackelyn Poling, receiving nurse at facility. CSW arranging EMS transport for discharge and facility aware. Donavan Foil, RN

## 2019-01-08 NOTE — Discharge Summary (Addendum)
Physician Discharge Summary  [name] TIR:[id number] DOB: [birth year] DOA: 01/03/2019  PCP: Redmond School, MD  Admit date: 01/03/2019  Discharge date: 01/08/2019  Admitted From:Home  Disposition:  SNF  Recommendations for Outpatient Follow-up:  1. Follow up with PCP in 1-2 weeks 2. Continue on ciprofloxacin for Klebsiella pneumoniae UTI as prescribed for 3 more days to complete course of treatment 3. Continue metoprolol as noted for rate control.  Home Health: None  Equipment/Devices: None  Discharge Condition: Stable  CODE STATUS: Full  Diet recommendation: Heart Healthy/carb modified  Brief/Interim Summary: Per HPI: Lauren Glad Reeceis a 85 y.o.femalewith medical history significant ofanemia, aortic stenosis, osteoarthritis, history of asthma, history of atrial flutter/fibrillation, B12 deficiency, breast cancer, cerebrovascular disease, depression, type 2 diabetes, diabetic retinopathy, frequent falls is coming to the emergency department after having difficulty getting him off from the floor since 10 in the morning. She states she did not fall, she was just unable to have her legs work after she was trying to back up the stairs with a package. She complains that she has not been progressively weaker for the past month. She denies fever, chills, but feels fatigued. Denies dyspnea, wheezing, hemoptysis, chest pain, palpitations, dizziness, diaphoresis, PND, orthopnea, but get occasional lower extremity edema. She denies abdominal pain, nausea or vomiting, diarrhea or constipation, melena or hematochezia. No dysuria, frequency or hematuria. Denies polyuria, polydipsia, polyphagia or blurred vision.  Patient was admitted for worsening weakness and higher frequency of falls with noted acute lower UTI for which she has been started on Rocephin, but now transition to oral ciprofloxacin for Klebsiella pneumonia that has been seen in urine cultures.  She will require a total 7-day  course of treatment and there for 3 more days on ciprofloxacin.  She has been seen by physical therapy with recommendations for inpatient rehabilitation.  During this course of admission, she did have some atrial fibrillation with RVR which was treated with IV metoprolol with resolution.  She has been started on some metoprolol to help with rate control which she was not taking previously.  She does continue with anticoagulation using low-dose aspirin on account of recurrent falls.  No other acute events have been noted throughout the course of this admission.  Discharge Diagnoses:  Principal Problem:   Acute lower UTI (urinary tract infection) Active Problems:   Hyperlipidemia   DEPRESSION/ANXIETY   Paroxysmal Atrial fibrillation (HCC)   Anemia   Essential hypertension   Atrial flutter (HCC)   Type 2 diabetes mellitus ([place])   CKD stage 3 due to type 2 diabetes mellitus ([place])  Principal discharge diagnosis: Worsening weakness secondary to acute Klebsiella pneumonia UTI.  Discharge Instructions  Discharge Instructions    Diet - low sodium heart healthy   Complete by:  As directed    Diet - low sodium heart healthy   Complete by:  As directed    Increase activity slowly   Complete by:  As directed    Increase activity slowly   Complete by:  As directed      Allergies as of 01/08/2019      Reactions   Mold Extract [trichophyton Mentagrophyte] Other (See Comments)   Reaction:  Itchy, watery eyes/sneezing    Morphine And Related Other (See Comments)   Reaction:  Drowsiness. "I didn't wake up for almost 1 week" after taking morphine.    Niaspan [niacin] Other (See Comments)   Reaction:  Flushing    Pollen Extract Other (See Comments)   Reaction:  Itchy, watery eyes/sneezing       Medication List    TAKE these medications   albuterol 108 (90 Base) MCG/ACT inhaler Commonly known as:  PROAIR HFA Inhale 2 puffs into the lungs every 6 (six) hours as needed for wheezing or shortness  of breath.   aspirin EC 81 MG tablet Take 81 mg by mouth daily.   ciprofloxacin 500 MG tablet Commonly known as:  CIPRO Take 1 tablet (500 mg total) by mouth 2 (two) times daily for 3 days.   clonazePAM 0.5 MG tablet Commonly known as:  KLONOPIN Take 0.5 mg by mouth daily as needed for anxiety.   cyanocobalamin 1000 MCG/ML injection Commonly known as:  (VITAMIN B-12) Inject 1,000 mcg into the muscle every 30 (thirty) days.   Diclofenac Sodium 3 % Gel Apply 1 application topically 3 (three) times daily.   esomeprazole 40 MG capsule Commonly known as:  NEXIUM Take 40 mg by mouth daily.   fenofibrate 145 MG tablet Commonly known as:  TRICOR Take 145 mg by mouth daily.   furosemide 40 MG tablet Commonly known as:  LASIX Take 1 tablet (40 mg total) by mouth daily. What changed:    how much to take  when to take this   glipiZIDE 10 MG tablet Commonly known as:  GLUCOTROL Take 10 mg by mouth 2 (two) times daily before a meal.   HYDROcodone-acetaminophen 10-325 MG tablet Commonly known as:  NORCO Take 1 tablet by mouth 2 (two) times daily as needed for moderate pain.   insulin aspart 100 UNIT/ML FlexPen Commonly known as:  NOVOLOG FLEXPEN Inject 8 Units into the skin 3 (three) times daily with meals. What changed:    how much to take  when to take this   Insulin Degludec 200 UNIT/ML Sopn Commonly known as:  TRESIBA FLEXTOUCH Inject 12 Units into the skin at bedtime. What changed:  how much to take   loperamide 2 MG capsule Commonly known as:  IMODIUM Take 2 mg by mouth as needed for diarrhea or loose stools.   Metoprolol Tartrate 37.5 MG Tabs Take 37.5 mg by mouth 2 (two) times daily.   PARoxetine 40 MG tablet Commonly known as:  PAXIL Take 40 mg by mouth daily.   potassium chloride SA 20 MEQ tablet Commonly known as:  K-DUR,KLOR-CON Take 20 mEq by mouth daily as needed (for low potassium).   pregabalin 50 MG capsule Commonly known as:  LYRICA Take  50 mg by mouth 3 (three) times daily.   raloxifene 60 MG tablet Commonly known as:  EVISTA Take 60 mg by mouth daily.   rosuvastatin 40 MG tablet Commonly known as:  CRESTOR Take 1 tablet (40 mg total) by mouth at bedtime.   sitaGLIPtin 25 MG tablet Commonly known as:  JANUVIA Take 25 mg by mouth daily.       Contact information for follow-up providers    Elfredia Nevins, MD Follow up in 1 week(s).   Specialty:  Internal Medicine Contact information: [address] [place] [address] [phone]            Contact information for after-discharge care    Destination    [facility] .   Service:  Skilled Nursing Contact information: [address] [place] [address] [phone]                 Allergies  Allergen Reactions  . Mold Extract [Trichophyton Mentagrophyte] Other (See Comments)    Reaction:  Itchy, watery eyes/sneezing   . Morphine And Related Other (See Comments)    Reaction:  Drowsiness. "I didn't wake up for almost 1 week" after taking morphine.   . Niaspan [Niacin] Other (See Comments)    Reaction:  Flushing   . Pollen Extract Other (See Comments)    Reaction:  Itchy, watery eyes/sneezing     Consultations:  None   Procedures/Studies: Dg Chest 1 View  Result Date: 01/03/2019 CLINICAL DATA:  Fall and pain. EXAM: CHEST  1 VIEW COMPARISON:  01/03/2018 and prior radiographs FINDINGS: Cardiomegaly and pulmonary vascular congestion noted. Increased LEFT mid and lower opacities noted which may represent atelectasis, scarring or airspace disease. No definite pleural effusion noted. No pneumothorax. A LEFT sided Port-A-Cath is again noted. IMPRESSION: Cardiomegaly with pulmonary vascular congestion. Increased LEFT mid and lower lung opacities which may represent atelectasis, scarring or possibly airspace disease. Electronically Signed   By: Margarette Canada M.D.   On: 01/03/2019 22:11   Dg Lumbar Spine  Complete  Result Date: 01/03/2019 CLINICAL DATA:  Acute low back pain following fall. Initial encounter. EXAM: LUMBAR SPINE - COMPLETE 4+ VIEW COMPARISON:  11/10/2016 CT and prior studies FINDINGS: Five non rib-bearing lumbar type vertebra are again identified. No acute fracture subluxation. Posterior rod/bipedicular screw and interbody fusion at L3-L4-L5 again noted without significant change. Very mild remote compression fracture of the L1 SUPERIOR endplate identified. Multilevel degenerative changes are again noted with moderate to severe degenerative disc disease at L2-3. Aortic atherosclerotic calcifications are present. IMPRESSION: 1. No evidence of acute intracranial abnormality 2. L3-L5 fusion, multilevel degenerative disc disease and mild remote L1 SUPERIOR endplate compression fracture. Electronically Signed   By: Margarette Canada M.D.   On: 01/03/2019 22:15   Ct Head Wo Contrast  Result Date: 01/03/2019 CLINICAL DATA:  [place] this morning, found down. History of frequent falls, stroke, breast cancer, hypertension and hyperlipidemia. EXAM: CT HEAD WITHOUT CONTRAST CT CERVICAL SPINE WITHOUT CONTRAST TECHNIQUE: Multidetector CT imaging of the head and cervical spine was performed following the standard protocol without intravenous contrast. Multiplanar CT image reconstructions of the cervical spine were also generated. COMPARISON:  CT HEAD January 03, 2018 and CT cervical spine December 02, 2011 FINDINGS: CT HEAD FINDINGS BRAIN: No intraparenchymal hemorrhage, mass effect nor midline shift. No parenchymal brain volume loss for age. No hydrocephalus. Patchy supratentorial white matter hypodensities less than expected for patient's age, though non-specific are most compatible with chronic small vessel ischemic disease. 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 No acute large vascular territory infarcts. No abnormal extra-axial fluid collections. Basal cisterns are patent. VASCULAR: Moderate to  severe calcific atherosclerosis of the carotid siphons. SKULL: No skull fracture. No significant scalp soft tissue swelling. SINUSES/ORBITS: RIGHT sphenoid sinus mucosal thickening unchanged. Mastoid air cells are well aerated.The included ocular globes and orbital contents are non-suspicious. OTHER: None. CT CERVICAL SPINE FINDINGS ALIGNMENT: Maintained lordosis. Grade 1 C4-5 through C7-T1 anterolisthesis. Mild levoscoliosis mid cervical spine. SKULL BASE AND VERTEBRAE: Cervical vertebral bodies and posterior elements are intact. Severe RIGHT upper cervical facet arthropathy. SOFT TISSUES AND SPINAL CANAL: Nonacute. Moderate calcific atherosclerosis carotid bifurcations. DISC LEVELS: No severe osseous canal stenosis or neural foraminal narrowing. UPPER CHEST: Lung apices are clear. OTHER: None. IMPRESSION: CT HEAD: 1. No acute intracranial process. 2. Stable examination including old small RIGHT basal ganglia and LEFT cerebellar infarcts. CT CERVICAL SPINE: 1. No fracture. 2. Grade 1 C4-5 through C7-T1 spondylolisthesis on degenerative basis. Electronically Signed   By: Sandie Ano  Bloomer M.D.   On: 01/03/2019 22:10   Ct Cervical Spine Wo Contrast  Result Date: 01/03/2019 CLINICAL DATA:  [place] this morning, found down. History of frequent falls, stroke, breast cancer, hypertension and hyperlipidemia. EXAM: CT HEAD WITHOUT CONTRAST CT CERVICAL SPINE WITHOUT CONTRAST TECHNIQUE: Multidetector CT imaging of the head and cervical spine was performed following the standard protocol without intravenous contrast. Multiplanar CT image reconstructions of the cervical spine were also generated. COMPARISON:  CT HEAD January 03, 2018 and CT cervical spine December 02, 2011 FINDINGS: CT HEAD FINDINGS BRAIN: No intraparenchymal hemorrhage, mass effect nor midline shift. No parenchymal brain volume loss for age. No hydrocephalus. Patchy supratentorial white matter hypodensities less than expected for patient's age, though  non-specific are most compatible with chronic small vessel ischemic disease. 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 No acute large vascular territory infarcts. No abnormal extra-axial fluid collections. Basal cisterns are patent. VASCULAR: Moderate to severe calcific atherosclerosis of the carotid siphons. SKULL: No skull fracture. No significant scalp soft tissue swelling. SINUSES/ORBITS: RIGHT sphenoid sinus mucosal thickening unchanged. Mastoid air cells are well aerated.The included ocular globes and orbital contents are non-suspicious. OTHER: None. CT CERVICAL SPINE FINDINGS ALIGNMENT: Maintained lordosis. Grade 1 C4-5 through C7-T1 anterolisthesis. Mild levoscoliosis mid cervical spine. SKULL BASE AND VERTEBRAE: Cervical vertebral bodies and posterior elements are intact. Severe RIGHT upper cervical facet arthropathy. SOFT TISSUES AND SPINAL CANAL: Nonacute. Moderate calcific atherosclerosis carotid bifurcations. DISC LEVELS: No severe osseous canal stenosis or neural foraminal narrowing. UPPER CHEST: Lung apices are clear. OTHER: None. IMPRESSION: CT HEAD: 1. No acute intracranial process. 2. Stable examination including old small RIGHT basal ganglia and LEFT cerebellar infarcts. CT CERVICAL SPINE: 1. No fracture. 2. Grade 1 C4-5 through C7-T1 spondylolisthesis on degenerative basis. Electronically Signed   By: Elon Alas M.D.   On: 01/03/2019 22:10    Discharge Exam: Vitals:   01/08/19 0643 01/08/19 1314  BP: 114/69 127/64  Pulse: 85 (!) 110  Resp: 20 19  Temp: 98.7 F (37.1 C)   SpO2: 94% 95%   Vitals:   01/07/19 2100 01/07/19 2131 01/08/19 0643 01/08/19 1314  BP: 120/70  114/69 127/64  Pulse: 100  85 (!) 110  Resp: $Remo'20  20 19  'tWvVS$ Temp: 98.1 F (36.7 C)  98.7 F (37.1 C)   TempSrc: Oral  Oral   SpO2: 96% (!) 88% 94% 95%  Weight:      Height:        General: Pt is alert, awake, not in acute distress Cardiovascular: RRR, S1/S2 +, no rubs, no  gallops Respiratory: CTA bilaterally, no wheezing, no rhonchi Abdominal: Soft, NT, ND, bowel sounds + Extremities: 1+ edema, no cyanosis    The results of significant diagnostics from this hospitalization (including imaging, microbiology, ancillary and laboratory) are listed below for reference.     Microbiology: Recent Results (from the past 240 hour(s))  Urine culture     Status: Abnormal   Collection Time: 01/03/19  8:55 PM  Result Value Ref Range Status   Specimen Description   Final    URINE, CLEAN CATCH Performed at [facility], [address] [place], [place] [address]    Special Requests   Final    NONE Performed at [facility], [address] [place], [place] [address]    Culture >=100,000 COLONIES/mL KLEBSIELLA PNEUMONIAE (A)  Final   Report Status 01/08/2019 FINAL  Final   Organism ID, Bacteria KLEBSIELLA PNEUMONIAE (A)  Final  Susceptibility   Klebsiella pneumoniae - MIC*    AMPICILLIN RESISTANT Resistant     CEFAZOLIN <=4 SENSITIVE Sensitive     CEFTRIAXONE <=1 SENSITIVE Sensitive     CIPROFLOXACIN <=0.25 SENSITIVE Sensitive     GENTAMICIN <=1 SENSITIVE Sensitive     IMIPENEM <=0.25 SENSITIVE Sensitive     NITROFURANTOIN 64 INTERMEDIATE Intermediate     TRIMETH/SULFA <=20 SENSITIVE Sensitive     AMPICILLIN/SULBACTAM 4 SENSITIVE Sensitive     PIP/TAZO <=4 SENSITIVE Sensitive     Extended ESBL NEGATIVE Sensitive     * >=100,000 COLONIES/mL KLEBSIELLA PNEUMONIAE  MRSA PCR Screening     Status: None   Collection Time: 01/04/19 12:52 AM  Result Value Ref Range Status   MRSA by PCR NEGATIVE NEGATIVE Final    Comment:        The GeneXpert MRSA Assay (FDA approved for NASAL specimens only), is one component of a comprehensive MRSA colonization surveillance program. It is not intended to diagnose MRSA infection nor to guide or monitor treatment for MRSA infections. Performed at [facility], [address], [place], [place] [address]      Labs: BNP  (last 3 results) No results for input(s): BNP in the last 8760 hours. Basic Metabolic Panel: Recent Labs  Lab 01/03/19 2121 01/04/19 0442 01/05/19 0522  NA 142 140 143  K 5.4* 5.1 5.0  CL 112* 113* 116*  CO2 $Re'22 22 22  'dch$ GLUCOSE 240* 311* 162*  BUN 41* 40* 38*  CREATININE 1.87* 1.82* 1.87*  CALCIUM 9.0 8.7* 9.1   Liver Function Tests: Recent Labs  Lab 01/03/19 2121  AST 12*  ALT 9  ALKPHOS 45  BILITOT 0.7  PROT 6.3*  ALBUMIN 3.5   No results for input(s): LIPASE, AMYLASE in the last 168 hours. No results for input(s): AMMONIA in the last 168 hours. CBC: Recent Labs  Lab 01/03/19 2121 01/04/19 0442 01/05/19 0522 01/06/19 0524  WBC 7.5 6.0 7.4 8.5  NEUTROABS 4.6 3.7 4.9 5.9  HGB 10.5* 9.7* 10.2* 10.2*  HCT 37.5 34.7* 35.8* 36.2  MCV 87.0 86.1 87.3 87.0  PLT 222 193 201 208   Cardiac Enzymes: Recent Labs  Lab 01/03/19 2121  CKTOTAL 84  TROPONINI <0.03   BNP: Invalid input(s): POCBNP CBG: Recent Labs  Lab 01/07/19 1106 01/07/19 1631 01/07/19 2102 01/08/19 0727 01/08/19 1126  GLUCAP 209* 202* 162* 164* 173*   D-Dimer No results for input(s): DDIMER in the last 72 hours. Hgb A1c No results for input(s): HGBA1C in the last 72 hours. Lipid Profile No results for input(s): CHOL, HDL, LDLCALC, TRIG, CHOLHDL, LDLDIRECT in the last 72 hours. Thyroid function studies No results for input(s): TSH, T4TOTAL, T3FREE, THYROIDAB in the last 72 hours.  Invalid input(s): FREET3 Anemia work up No results for input(s): VITAMINB12, FOLATE, FERRITIN, TIBC, IRON, RETICCTPCT in the last 72 hours. Urinalysis    Component Value Date/Time   COLORURINE YELLOW 01/03/2019 2055   APPEARANCEUR CLOUDY (A) 01/03/2019 2055   LABSPEC 1.023 01/03/2019 2055   PHURINE 5.0 01/03/2019 2055   GLUCOSEU 50 (A) 01/03/2019 2055   GLUCOSEU neg 04/07/2010   HGBUR MODERATE (A) 01/03/2019 2055   BILIRUBINUR NEGATIVE 01/03/2019 2055   [place] 01/03/2019 2055   PROTEINUR 100  (A) 01/03/2019 2055   UROBILINOGEN 0.2 09/27/2014 1630   NITRITE NEGATIVE 01/03/2019 2055   LEUKOCYTESUR MODERATE (A) 01/03/2019 2055   Sepsis Labs Invalid input(s): PROCALCITONIN,  WBC,  LACTICIDVEN Microbiology Recent Results (from the past 240 hour(s))  Urine culture     Status: Abnormal   Collection Time: 01/03/19  8:55 PM  Result Value Ref Range Status   Specimen Description   Final    URINE, CLEAN CATCH Performed at [facility], [address], [place], [place] [address]    Special Requests   Final    NONE Performed at [facility], [address] [place], [place] [address]    Culture >=100,000 COLONIES/mL KLEBSIELLA PNEUMONIAE (A)  Final   Report Status 01/08/2019 FINAL  Final   Organism ID, Bacteria KLEBSIELLA PNEUMONIAE (A)  Final      Susceptibility   Klebsiella pneumoniae - MIC*    AMPICILLIN RESISTANT Resistant     CEFAZOLIN <=4 SENSITIVE Sensitive     CEFTRIAXONE <=1 SENSITIVE Sensitive     CIPROFLOXACIN <=0.25 SENSITIVE Sensitive     GENTAMICIN <=1 SENSITIVE Sensitive     IMIPENEM <=0.25 SENSITIVE Sensitive     NITROFURANTOIN 64 INTERMEDIATE Intermediate     TRIMETH/SULFA <=20 SENSITIVE Sensitive     AMPICILLIN/SULBACTAM 4 SENSITIVE Sensitive     PIP/TAZO <=4 SENSITIVE Sensitive     Extended ESBL NEGATIVE Sensitive     * >=100,000 COLONIES/mL KLEBSIELLA PNEUMONIAE  MRSA PCR Screening     Status: None   Collection Time: 01/04/19 12:52 AM  Result Value Ref Range Status   MRSA by PCR NEGATIVE NEGATIVE Final    Comment:        The GeneXpert MRSA Assay (FDA approved for NASAL specimens only), is one component of a comprehensive MRSA colonization surveillance program. It is not intended to diagnose MRSA infection nor to guide or monitor treatment for MRSA infections. Performed at [facility], [address], [place], [place] [address]      Time coordinating discharge: 35 minutes  SIGNED:   Kathie Dike, MD [facility] 01/08/2019, 1:58  PM  If 7PM-7AM, please contact night-coverage [email or web address]

## 2019-01-08 NOTE — Clinical Social Work Placement (Addendum)
   CLINICAL SOCIAL WORK PLACEMENT  NOTE  Date:  01/08/2019  Patient Details  Name: [name] MRN: [id number] Date of Birth: March 15, 1933  Clinical Social Work is seeking post-discharge placement for this patient at the [place] level of care (*CSW will initial, date and re-position this form in  chart as items are completed):  Yes   Patient/family provided with [facility] Work Department's list of facilities offering this level of care within the geographic area requested by the patient (or if unable, by the patient's family).  Yes   Patient/family informed of their freedom to choose among providers that offer the needed level of care, that participate in Medicare, Medicaid or managed care program needed by the patient, have an available bed and are willing to accept the patient.  Yes   Patient/family informed of [facility]'s ownership interest in [facility] and [facility], as well as of the fact that they are under no obligation to receive care at these facilities.  PASRR submitted to EDS on       PASRR number received on       Existing PASRR number confirmed on 01/05/19     FL2 transmitted to all facilities in geographic area requested by pt/family on 01/05/19     FL2 transmitted to all facilities within larger geographic area on       Patient informed that his/her managed care company has contracts with or will negotiate with certain facilities, including the following:        Yes   Patient/family informed of bed offers received.  Patient chooses bed at [facility]     Physician recommends and patient chooses bed at      Patient to be transferred to [facility] [place] on 01/08/19.  Patient to be transferred to facility by [facility]     Patient family notified on 01/08/19 of transfer.  Name of family member notified:  [name]     PHYSICIAN       Additional Comment:  Discharge clinicals sent. LCSW signing off.    _______________________________________________ Ihor Gully, LCSW 01/08/2019, 1:58 PM

## 2019-01-08 NOTE — Progress Notes (Signed)
Physical Therapy Treatment Patient Details Name: [name] MRN: [id number] DOB: [birth year] Today's Date: 01/08/2019    History of Present Illness [name] is a 85 y.o. female with medical history significant of anemia, aortic stenosis, osteoarthritis, history of asthma, history of atrial flutter/fibrillation, B12 deficiency, breast cancer, cerebrovascular disease, depression, type 2 diabetes, diabetic retinopathy, frequent falls is coming to the emergency department after having difficulty getting him off from the floor since 10 in the morning.  She states she did not fall, she was just unable to have her legs work after she was trying to back up the stairs with a package.  She complains that she has not been progressively weaker for the past month.  She denies fever, chills, but feels fatigued.  Denies dyspnea, wheezing, hemoptysis, chest pain, palpitations, dizziness, diaphoresis, PND, orthopnea, but get occasional lower extremity edema.  She denies abdominal pain, nausea or vomiting, diarrhea or constipation, melena or hematochezia.  No dysuria, frequency or hematuria.  Denies polyuria, polydipsia, polyphagia or blurred vision.    PT Comments    Patient demonstrates labored movement for sitting up at bedside, c/o discomfort over bottom while seated, good return for completing BLE ROM/strengthening exercises, limited to a few side steps due to BLE weakness with poor standing balance, had to sit after first attempt for transferring to chair requiring approximately 3-4 minutes rest break, the then able to take a few steps to sit up in chair after therapy.  Patient will benefit from continued physical therapy in hospital and recommended venue below to increase strength, balance, endurance for safe ADLs and gait.    Follow Up Recommendations  SNF     Equipment Recommendations  None recommended by PT    Recommendations for Other Services       Precautions / Restrictions  Precautions Precautions: Fall Restrictions Weight Bearing Restrictions: No    Mobility  Bed Mobility Overal bed mobility: Needs Assistance Bed Mobility: Supine to Sit     Supine to sit: Min guard     General bed mobility comments: with use of bedrail  Transfers Overall transfer level: Needs assistance Equipment used: Rolling walker (2 wheeled) Transfers: Sit to/from [facility] Sit to Stand: Mod assist;Min assist Stand pivot transfers: Mod assist       General transfer comment: slow labored movement, unsteady on feet with poor standing balance  Ambulation/Gait Ambulation/Gait assistance: Mod assist;Max assist Gait Distance (Feet): 3 Feet Assistive device: Rolling walker (2 wheeled) Gait Pattern/deviations: Decreased step length - right;Decreased step length - left;Decreased stride length Gait velocity: slow   General Gait Details: limited to 3-4 slow unsteady labored side steps, having to sit due to BLE weakness and poor standing balance   Stairs             Wheelchair Mobility    Modified Rankin (Stroke Patients Only)       Balance Overall balance assessment: Needs assistance Sitting-balance support: Feet supported;No upper extremity supported Sitting balance-Leahy Scale: Good Sitting balance - Comments: seated edge of bed   Standing balance support: Bilateral upper extremity supported;During functional activity Standing balance-Leahy Scale: Poor Standing balance comment: fair/poor using RW                            Cognition Arousal/Alertness: Awake/alert Behavior During Therapy: WFL for tasks assessed/performed Overall Cognitive Status: Within Functional Limits for tasks assessed  Exercises General Exercises - Lower Extremity Long Arc Quad: Seated;AROM;Strengthening;Both;10 reps Hip Flexion/Marching: Seated;AROM;Strengthening;Both;10 reps Toe Raises:  Seated;AROM;Strengthening;Both;10 reps Heel Raises: Seated;Strengthening;Both;10 reps;AROM    General Comments        Pertinent Vitals/Pain Pain Assessment: Faces Faces Pain Scale: Hurts a little bit Pain Location: low back and right side of lower trunk Pain Descriptors / Indicators: Sore;Aching Pain Intervention(s): Limited activity within patient's tolerance    Home Living                      Prior Function            PT Goals (current goals can now be found in the care plan section) Acute Rehab PT Goals Patient Stated Goal: return home after rehab PT Goal Formulation: With patient Time For Goal Achievement: 01/19/19 Potential to Achieve Goals: Good Progress towards PT goals: Progressing toward goals    Frequency    Min 3X/week      PT Plan Current plan remains appropriate    Co-evaluation              AM-PAC PT "6 Clicks" Mobility   Outcome Measure  Help needed turning from your back to your side while in a flat bed without using bedrails?: None Help needed moving from lying on your back to sitting on the side of a flat bed without using bedrails?: A Little Help needed moving to and from a bed to a chair (including a wheelchair)?: A Lot Help needed standing up from a chair using your arms (e.g., wheelchair or bedside chair)?: A Lot Help needed to walk in hospital room?: A Lot Help needed climbing 3-5 steps with a railing? : Total 6 Click Score: 14    End of Session Equipment Utilized During Treatment: Gait belt Activity Tolerance: Patient tolerated treatment well;Patient limited by fatigue Patient left: in chair;with call bell/phone within reach Nurse Communication: Mobility status PT Visit Diagnosis: Unsteadiness on feet (R26.81);Other abnormalities of gait and mobility (R26.89);Muscle weakness (generalized) (M62.81)     Time: 8638-1771 PT Time Calculation (min) (ACUTE ONLY): 31 min  Charges:  $Therapeutic Exercise: 8-22  mins $Therapeutic Activity: 8-22 mins                     11:47 AM, 01/08/19 [name], MPT Physical Therapist with [facility] [phone] office [phone] mobile phone

## 2019-01-13 DIAGNOSIS — N183 Chronic kidney disease, stage 3 (moderate): Secondary | ICD-10-CM | POA: Diagnosis not present

## 2019-01-13 DIAGNOSIS — I4891 Unspecified atrial fibrillation: Secondary | ICD-10-CM | POA: Diagnosis not present

## 2019-01-13 DIAGNOSIS — I1310 Hypertensive heart and chronic kidney disease without heart failure, with stage 1 through stage 4 chronic kidney disease, or unspecified chronic kidney disease: Secondary | ICD-10-CM | POA: Diagnosis not present

## 2019-01-13 DIAGNOSIS — N390 Urinary tract infection, site not specified: Secondary | ICD-10-CM | POA: Diagnosis not present

## 2019-01-16 DIAGNOSIS — N390 Urinary tract infection, site not specified: Secondary | ICD-10-CM | POA: Diagnosis not present

## 2019-01-16 DIAGNOSIS — I4891 Unspecified atrial fibrillation: Secondary | ICD-10-CM | POA: Diagnosis not present

## 2019-01-16 DIAGNOSIS — D649 Anemia, unspecified: Secondary | ICD-10-CM | POA: Diagnosis not present

## 2019-01-16 DIAGNOSIS — E785 Hyperlipidemia, unspecified: Secondary | ICD-10-CM | POA: Diagnosis not present

## 2019-01-20 DIAGNOSIS — R2689 Other abnormalities of gait and mobility: Secondary | ICD-10-CM | POA: Diagnosis not present

## 2019-01-20 DIAGNOSIS — L97911 Non-pressure chronic ulcer of unspecified part of right lower leg limited to breakdown of skin: Secondary | ICD-10-CM | POA: Diagnosis not present

## 2019-01-20 DIAGNOSIS — R197 Diarrhea, unspecified: Secondary | ICD-10-CM | POA: Diagnosis not present

## 2019-01-20 DIAGNOSIS — M6281 Muscle weakness (generalized): Secondary | ICD-10-CM | POA: Diagnosis not present

## 2019-01-22 DIAGNOSIS — R5381 Other malaise: Secondary | ICD-10-CM | POA: Diagnosis not present

## 2019-01-22 DIAGNOSIS — R197 Diarrhea, unspecified: Secondary | ICD-10-CM | POA: Diagnosis not present

## 2019-01-22 DIAGNOSIS — N390 Urinary tract infection, site not specified: Secondary | ICD-10-CM | POA: Diagnosis not present

## 2019-01-29 DIAGNOSIS — R2689 Other abnormalities of gait and mobility: Secondary | ICD-10-CM | POA: Diagnosis not present

## 2019-01-29 DIAGNOSIS — M6281 Muscle weakness (generalized): Secondary | ICD-10-CM | POA: Diagnosis not present

## 2019-01-29 DIAGNOSIS — L97911 Non-pressure chronic ulcer of unspecified part of right lower leg limited to breakdown of skin: Secondary | ICD-10-CM | POA: Diagnosis not present

## 2019-02-02 DIAGNOSIS — R2689 Other abnormalities of gait and mobility: Secondary | ICD-10-CM | POA: Diagnosis not present

## 2019-02-02 DIAGNOSIS — I4891 Unspecified atrial fibrillation: Secondary | ICD-10-CM | POA: Diagnosis not present

## 2019-02-02 DIAGNOSIS — M6281 Muscle weakness (generalized): Secondary | ICD-10-CM | POA: Diagnosis not present

## 2019-02-02 DIAGNOSIS — N390 Urinary tract infection, site not specified: Secondary | ICD-10-CM | POA: Diagnosis not present

## 2019-02-03 DIAGNOSIS — R609 Edema, unspecified: Secondary | ICD-10-CM | POA: Diagnosis not present

## 2019-02-03 DIAGNOSIS — I4891 Unspecified atrial fibrillation: Secondary | ICD-10-CM | POA: Diagnosis not present

## 2019-02-03 DIAGNOSIS — M6281 Muscle weakness (generalized): Secondary | ICD-10-CM | POA: Diagnosis not present

## 2019-02-03 DIAGNOSIS — R197 Diarrhea, unspecified: Secondary | ICD-10-CM | POA: Diagnosis not present

## 2019-02-03 DIAGNOSIS — N183 Chronic kidney disease, stage 3 (moderate): Secondary | ICD-10-CM | POA: Diagnosis not present

## 2019-02-03 DIAGNOSIS — N390 Urinary tract infection, site not specified: Secondary | ICD-10-CM | POA: Diagnosis not present

## 2019-02-03 DIAGNOSIS — L97911 Non-pressure chronic ulcer of unspecified part of right lower leg limited to breakdown of skin: Secondary | ICD-10-CM | POA: Diagnosis not present

## 2019-02-03 DIAGNOSIS — R2689 Other abnormalities of gait and mobility: Secondary | ICD-10-CM | POA: Diagnosis not present

## 2019-02-04 DIAGNOSIS — I4891 Unspecified atrial fibrillation: Secondary | ICD-10-CM | POA: Diagnosis not present

## 2019-02-04 DIAGNOSIS — R2689 Other abnormalities of gait and mobility: Secondary | ICD-10-CM | POA: Diagnosis not present

## 2019-02-04 DIAGNOSIS — M6281 Muscle weakness (generalized): Secondary | ICD-10-CM | POA: Diagnosis not present

## 2019-02-04 DIAGNOSIS — N390 Urinary tract infection, site not specified: Secondary | ICD-10-CM | POA: Diagnosis not present

## 2019-02-05 DIAGNOSIS — I4891 Unspecified atrial fibrillation: Secondary | ICD-10-CM | POA: Diagnosis not present

## 2019-02-05 DIAGNOSIS — R2689 Other abnormalities of gait and mobility: Secondary | ICD-10-CM | POA: Diagnosis not present

## 2019-02-05 DIAGNOSIS — N390 Urinary tract infection, site not specified: Secondary | ICD-10-CM | POA: Diagnosis not present

## 2019-02-05 DIAGNOSIS — M6281 Muscle weakness (generalized): Secondary | ICD-10-CM | POA: Diagnosis not present

## 2019-02-06 DIAGNOSIS — M6281 Muscle weakness (generalized): Secondary | ICD-10-CM | POA: Diagnosis not present

## 2019-02-06 DIAGNOSIS — I4891 Unspecified atrial fibrillation: Secondary | ICD-10-CM | POA: Diagnosis not present

## 2019-02-06 DIAGNOSIS — N390 Urinary tract infection, site not specified: Secondary | ICD-10-CM | POA: Diagnosis not present

## 2019-02-06 DIAGNOSIS — R2689 Other abnormalities of gait and mobility: Secondary | ICD-10-CM | POA: Diagnosis not present

## 2019-02-09 DIAGNOSIS — I1310 Hypertensive heart and chronic kidney disease without heart failure, with stage 1 through stage 4 chronic kidney disease, or unspecified chronic kidney disease: Secondary | ICD-10-CM | POA: Diagnosis not present

## 2019-02-09 DIAGNOSIS — N390 Urinary tract infection, site not specified: Secondary | ICD-10-CM | POA: Diagnosis not present

## 2019-02-09 DIAGNOSIS — M6281 Muscle weakness (generalized): Secondary | ICD-10-CM | POA: Diagnosis not present

## 2019-02-09 DIAGNOSIS — I4891 Unspecified atrial fibrillation: Secondary | ICD-10-CM | POA: Diagnosis not present

## 2019-02-09 DIAGNOSIS — R2689 Other abnormalities of gait and mobility: Secondary | ICD-10-CM | POA: Diagnosis not present

## 2019-02-09 DIAGNOSIS — R609 Edema, unspecified: Secondary | ICD-10-CM | POA: Diagnosis not present

## 2019-02-10 DIAGNOSIS — R2689 Other abnormalities of gait and mobility: Secondary | ICD-10-CM | POA: Diagnosis not present

## 2019-02-10 DIAGNOSIS — M6281 Muscle weakness (generalized): Secondary | ICD-10-CM | POA: Diagnosis not present

## 2019-02-10 DIAGNOSIS — N390 Urinary tract infection, site not specified: Secondary | ICD-10-CM | POA: Diagnosis not present

## 2019-02-10 DIAGNOSIS — L97911 Non-pressure chronic ulcer of unspecified part of right lower leg limited to breakdown of skin: Secondary | ICD-10-CM | POA: Diagnosis not present

## 2019-02-10 DIAGNOSIS — I4891 Unspecified atrial fibrillation: Secondary | ICD-10-CM | POA: Diagnosis not present

## 2019-02-11 ENCOUNTER — Emergency Department (HOSPITAL_COMMUNITY): Payer: Medicare HMO

## 2019-02-11 ENCOUNTER — Encounter (HOSPITAL_COMMUNITY): Payer: Self-pay | Admitting: Emergency Medicine

## 2019-02-11 ENCOUNTER — Emergency Department (HOSPITAL_COMMUNITY)
Admission: EM | Admit: 2019-02-11 | Discharge: 2019-02-11 | Disposition: A | Discharge status: Skilled Nursing Facility | Payer: Medicare HMO | Source: Home / Self Care | Attending: Emergency Medicine | Admitting: Emergency Medicine

## 2019-02-11 ENCOUNTER — Other Ambulatory Visit: Payer: Self-pay

## 2019-02-11 DIAGNOSIS — R2689 Other abnormalities of gait and mobility: Secondary | ICD-10-CM | POA: Diagnosis not present

## 2019-02-11 DIAGNOSIS — Z7982 Long term (current) use of aspirin: Secondary | ICD-10-CM

## 2019-02-11 DIAGNOSIS — Y939 Activity, unspecified: Secondary | ICD-10-CM | POA: Insufficient documentation

## 2019-02-11 DIAGNOSIS — Y999 Unspecified external cause status: Secondary | ICD-10-CM | POA: Insufficient documentation

## 2019-02-11 DIAGNOSIS — J45909 Unspecified asthma, uncomplicated: Secondary | ICD-10-CM

## 2019-02-11 DIAGNOSIS — W06XXXA Fall from bed, initial encounter: Secondary | ICD-10-CM

## 2019-02-11 DIAGNOSIS — Z7401 Bed confinement status: Secondary | ICD-10-CM | POA: Diagnosis not present

## 2019-02-11 DIAGNOSIS — E1122 Type 2 diabetes mellitus with diabetic chronic kidney disease: Secondary | ICD-10-CM | POA: Insufficient documentation

## 2019-02-11 DIAGNOSIS — E1165 Type 2 diabetes mellitus with hyperglycemia: Secondary | ICD-10-CM | POA: Diagnosis not present

## 2019-02-11 DIAGNOSIS — N183 Chronic kidney disease, stage 3 (moderate): Secondary | ICD-10-CM | POA: Insufficient documentation

## 2019-02-11 DIAGNOSIS — Z87891 Personal history of nicotine dependence: Secondary | ICD-10-CM | POA: Insufficient documentation

## 2019-02-11 DIAGNOSIS — W19XXXA Unspecified fall, initial encounter: Secondary | ICD-10-CM | POA: Diagnosis not present

## 2019-02-11 DIAGNOSIS — I132 Hypertensive heart and chronic kidney disease with heart failure and with stage 5 chronic kidney disease, or end stage renal disease: Secondary | ICD-10-CM | POA: Diagnosis not present

## 2019-02-11 DIAGNOSIS — I4891 Unspecified atrial fibrillation: Secondary | ICD-10-CM | POA: Diagnosis not present

## 2019-02-11 DIAGNOSIS — K5730 Diverticulosis of large intestine without perforation or abscess without bleeding: Secondary | ICD-10-CM | POA: Diagnosis not present

## 2019-02-11 DIAGNOSIS — Y92121 Bathroom in nursing home as the place of occurrence of the external cause: Secondary | ICD-10-CM

## 2019-02-11 DIAGNOSIS — N390 Urinary tract infection, site not specified: Secondary | ICD-10-CM | POA: Diagnosis not present

## 2019-02-11 DIAGNOSIS — G8929 Other chronic pain: Secondary | ICD-10-CM | POA: Diagnosis not present

## 2019-02-11 DIAGNOSIS — R410 Disorientation, unspecified: Secondary | ICD-10-CM | POA: Diagnosis not present

## 2019-02-11 DIAGNOSIS — R609 Edema, unspecified: Secondary | ICD-10-CM | POA: Diagnosis not present

## 2019-02-11 DIAGNOSIS — I10 Essential (primary) hypertension: Secondary | ICD-10-CM | POA: Diagnosis not present

## 2019-02-11 DIAGNOSIS — R69 Illness, unspecified: Secondary | ICD-10-CM | POA: Diagnosis not present

## 2019-02-11 DIAGNOSIS — Z794 Long term (current) use of insulin: Secondary | ICD-10-CM

## 2019-02-11 DIAGNOSIS — S42351A Displaced comminuted fracture of shaft of humerus, right arm, initial encounter for closed fracture: Secondary | ICD-10-CM

## 2019-02-11 DIAGNOSIS — I129 Hypertensive chronic kidney disease with stage 1 through stage 4 chronic kidney disease, or unspecified chronic kidney disease: Secondary | ICD-10-CM

## 2019-02-11 DIAGNOSIS — M1612 Unilateral primary osteoarthritis, left hip: Secondary | ICD-10-CM | POA: Diagnosis not present

## 2019-02-11 DIAGNOSIS — I509 Heart failure, unspecified: Secondary | ICD-10-CM | POA: Diagnosis not present

## 2019-02-11 DIAGNOSIS — R531 Weakness: Secondary | ICD-10-CM | POA: Diagnosis not present

## 2019-02-11 DIAGNOSIS — R4182 Altered mental status, unspecified: Secondary | ICD-10-CM | POA: Diagnosis not present

## 2019-02-11 DIAGNOSIS — I110 Hypertensive heart disease with heart failure: Secondary | ICD-10-CM | POA: Diagnosis not present

## 2019-02-11 DIAGNOSIS — R52 Pain, unspecified: Secondary | ICD-10-CM | POA: Diagnosis not present

## 2019-02-11 DIAGNOSIS — M6281 Muscle weakness (generalized): Secondary | ICD-10-CM | POA: Diagnosis not present

## 2019-02-11 DIAGNOSIS — Z79899 Other long term (current) drug therapy: Secondary | ICD-10-CM | POA: Insufficient documentation

## 2019-02-11 DIAGNOSIS — R0902 Hypoxemia: Secondary | ICD-10-CM | POA: Diagnosis not present

## 2019-02-11 DIAGNOSIS — Z853 Personal history of malignant neoplasm of breast: Secondary | ICD-10-CM | POA: Insufficient documentation

## 2019-02-11 DIAGNOSIS — S42335A Nondisplaced oblique fracture of shaft of humerus, left arm, initial encounter for closed fracture: Secondary | ICD-10-CM | POA: Diagnosis not present

## 2019-02-11 DIAGNOSIS — M810 Age-related osteoporosis without current pathological fracture: Secondary | ICD-10-CM | POA: Diagnosis not present

## 2019-02-11 MED ORDER — OXYCODONE-ACETAMINOPHEN 5-325 MG PO TABS
1.0000 | ORAL_TABLET | Freq: Once | ORAL | Status: AC
  Administered 2019-02-11: 1 via ORAL
  Filled 2019-02-11: qty 1

## 2019-02-11 MED ORDER — FENTANYL CITRATE (PF) 100 MCG/2ML IJ SOLN
50.0000 ug | Freq: Once | INTRAMUSCULAR | Status: AC
  Administered 2019-02-11: 50 ug via INTRAMUSCULAR
  Filled 2019-02-11: qty 2

## 2019-02-11 MED ORDER — OXYCODONE-ACETAMINOPHEN 5-325 MG PO TABS: 1.0000 | ORAL_TABLET | Freq: Four times a day (QID) | ORAL | 0 refills | Status: DC | PRN

## 2019-02-11 NOTE — ED Triage Notes (Signed)
Pt bought from [facility] [place] [facility] via [facility]. Pt C/O right shoulder pain after a fall tonight. Pt has crepitus in the right upper extremity however no obvious deformity.

## 2019-02-11 NOTE — ED Notes (Signed)
[facility] called to transport pt back to facility.

## 2019-02-11 NOTE — ED Provider Notes (Signed)
[facility] EMERGENCY DEPARTMENT Provider Note   CSN: [id number] Arrival date & time: 02/11/19  0401  Time seen 4:10 AM  History   Chief Complaint Chief Complaint  Patient presents with  . Fall    HPI [name] is a 85 y.o. female.     HPI patient states she has been falling because her legs feel weak for the past 6 weeks.  She states tonight she was asleep and a caregiver at her facility woke her up and told her to sit on the side of the bed.  She states when she did she slipped off.  EMS states her right arm went behind her and got caught on a wheelchair that was parked next to her bed and when she fell her right arm was hyper extended posteriorly.  They describe crepitance in her shoulder area.  She presents with a sling in her arm in a natural position.  She states she did not hit her head or get knocked out.  She denies any numbness in her fingers.  Patient states she is right-handed  PCP Elfredia Nevins, MD   Past Medical History:  Diagnosis Date  . Anemia   . Aortic stenosis    Mild  . Arthritis   . Asthma   . Atrial flutter (HCC) 2002  . B12 deficiency 06/20/2015  . B12 deficiency 06/20/2015  . Breast carcinoma (HCC)   . Cerebrovascular disease    with a 60% LICA; repeat study in 10/2009-no obstructive disease; modest ASVD  . Chronic kidney disease    Creatinine-1.5 in 2010 and 2.5-3 in 2011; 1.5-10/2010;  Klebsiella UTI-10/2010. urine protein 36 mg/dl, mildly elevated  . Decreased bone density   . Depression   . Diabetes mellitus   . Diabetic retinopathy   . DJD (degenerative joint disease) 11/14/2011  . Falls infrequently 01/2010   fracture of pelvis and left humerus  . Fibromyalgia   . Headaches, cluster   . Hyperlipidemia   . Hypertension   . IBS (irritable bowel syndrome)    diverticulosis, gastroesophageal reflux disease  . Lower back pain   . Obesity 11/14/2011  . Spinal stenosis     Patient Active Problem List   Diagnosis Date Noted  . Acute  lower UTI (urinary tract infection) 01/03/2019  . Type 2 diabetes mellitus (HCC) 01/03/2019  . CKD stage 3 due to type 2 diabetes mellitus (HCC) 01/03/2019  . Hypoglycemia 02/16/2017  . Acute metabolic encephalopathy 02/16/2017  . Pressure injury of skin 02/16/2017  . Acute kidney injury superimposed on chronic kidney disease (HCC) 11/21/2016  . Hyperglycemia 11/21/2016  . Acute cystitis without hematuria 11/21/2016  . Diarrhea of presumed infectious origin 11/21/2016  . UTI (urinary tract infection) 11/10/2016  . Colitis 11/10/2016  . Elevated troponin I level 11/10/2016  . B12 deficiency 06/20/2015  . UTI (lower urinary tract infection) 05/21/2014  . DJD (degenerative joint disease) 11/14/2011  . Obesity 11/14/2011  . Inflammatory carcinoma of right breast   . Anemia   . IBS (irritable bowel syndrome)   . Essential hypertension   . Aortic stenosis   . Chronic kidney disease   . Fibromyalgia   . Cerebrovascular disease   . Atrial fibrillation (HCC) 07/14/2010  . Falls infrequently 01/31/2010  . Hyperlipidemia 11/15/2009  . 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 DEPRESSION/ANXIETY 10/12/2009  . SPINAL STENOSIS, LUMBAR 10/12/2009  . Atrial flutter (HCC) 12/03/2000    Past Surgical History:  Procedure  Laterality Date  . APPENDECTOMY    . CATARACT EXTRACTION, BILATERAL     Lens implants  . CENTRAL VENOUS CATHETER TUNNELED INSERTION SINGLE LUMEN    . CHOLECYSTECTOMY    . COLONOSCOPY  2012  . EYE SURGERY  nov 2012   for diabetic retinopathy  . LUMBAR DISC SURGERY     lumbosacral spine procedure x 2  . MASTECTOMY     Right breast for carcinoma     OB History   No obstetric history on file.      Home Medications    Prior to Admission medications   Medication Sig Start Date End Date Taking? Authorizing Provider  albuterol (PROAIR HFA) 108 (90 BASE) MCG/ACT inhaler Inhale 2 puffs into the lungs every 6 (six) hours as needed for wheezing or  shortness of breath. 06/16/14   Baird Cancer, PA-C  aspirin EC 81 MG tablet Take 81 mg by mouth daily.    [provider]  clonazePAM (KLONOPIN) 0.5 MG tablet Take 0.5 mg by mouth daily as needed for anxiety.  07/15/18   [provider]  cyanocobalamin (,VITAMIN B-12,) 1000 MCG/ML injection Inject 1,000 mcg into the muscle every 30 (thirty) days.    [provider]  Diclofenac Sodium 3 % GEL Apply 1 application topically 3 (three) times daily. 01/30/17   [provider]  esomeprazole (NEXIUM) 40 MG capsule Take 40 mg by mouth daily.     [provider]  fenofibrate (TRICOR) 145 MG tablet Take 145 mg by mouth daily.    [provider]  furosemide (LASIX) 40 MG tablet Take 1 tablet (40 mg total) by mouth daily. Patient taking differently: Take 80 mg by mouth 3 (three) times daily.  11/23/16   Thurnell Lose, MD  glipiZIDE (GLUCOTROL) 10 MG tablet Take 10 mg by mouth 2 (two) times daily before a meal.  08/08/18   [provider]  HYDROcodone-acetaminophen (NORCO) 10-325 MG per tablet Take 1 tablet by mouth 2 (two) times daily as needed for moderate pain.     [provider]  insulin aspart (NOVOLOG FLEXPEN) 100 UNIT/ML FlexPen Inject 8 Units into the skin 3 (three) times daily with meals. Patient taking differently: Inject 20 Units into the skin 2 (two) times daily.  02/17/17   Johnson, Clanford L, MD  Insulin Degludec (TRESIBA FLEXTOUCH) 200 UNIT/ML SOPN Inject 12 Units into the skin at bedtime. Patient taking differently: Inject 20 Units into the skin at bedtime.  02/17/17   Johnson, Clanford L, MD  loperamide (IMODIUM) 2 MG capsule Take 2 mg by mouth as needed for diarrhea or loose stools.  08/13/18   [provider]  metoprolol tartrate 37.5 MG TABS Take 37.5 mg by mouth 2 (two) times daily. 01/08/19   Kathie Dike, MD  oxyCODONE-acetaminophen (PERCOCET/ROXICET) 5-325 MG tablet Take 1 tablet by mouth every 6 (six)  hours as needed for severe pain. 02/11/19   Rolland Porter, MD  PARoxetine (PAXIL) 40 MG tablet Take 40 mg by mouth daily.     [provider]  potassium chloride SA (K-DUR,KLOR-CON) 20 MEQ tablet Take 20 mEq by mouth daily as needed (for low potassium).    [provider]  pregabalin (LYRICA) 50 MG capsule Take 50 mg by mouth 3 (three) times daily.     [provider]  raloxifene (EVISTA) 60 MG tablet Take 60 mg by mouth daily.    [provider]  rosuvastatin (CRESTOR) 40 MG tablet Take 1 tablet (  40 mg total) by mouth at bedtime. 07/20/15   Herminio Commons, MD  sitaGLIPtin (JANUVIA) 25 MG tablet Take 25 mg by mouth daily.    [provider]    Family History Family History  Problem Relation Age of Onset  . Alzheimer's disease Mother   . Heart attack Father   . Aneurysm Sister   . Coronary artery disease Brother     Social History Social History   Tobacco Use  . Smoking status: Former Smoker    Packs/day: 1.00    Years: 30.00    Pack years: 30.00    Types: Cigarettes    Last attempt to quit: 1982    Years since quitting: 38.2  . Smokeless tobacco: Never Used  Substance Use Topics  . Alcohol use: No  . Drug use: No  lives in a Nursing facility Uses a wheelchair   Allergies   Mold extract [trichophyton mentagrophyte]; Morphine and related; Niaspan [niacin]; and Pollen extract   Review of Systems Review of Systems  All other systems reviewed and are negative.    Physical Exam Updated Vital Signs  There were no vitals taken for this visit.   Physical Exam Vitals signs and nursing note reviewed.  Constitutional:      Appearance: Normal appearance. She is obese.  HENT:     Head: Normocephalic and atraumatic.     Right Ear: External ear normal.     Left Ear: External ear normal.     Nose: Nose normal.     Mouth/Throat:     Mouth: Mucous membranes are moist.  Eyes:     Extraocular Movements: Extraocular movements  intact.     Conjunctiva/sclera: Conjunctivae normal.     Pupils: Pupils are equal, round, and reactive to light.  Neck:     Musculoskeletal: Normal range of motion and neck supple.  Cardiovascular:     Rate and Rhythm: Normal rate and regular rhythm.  Pulmonary:     Effort: Pulmonary effort is normal. No respiratory distress.     Breath sounds: Normal breath sounds.  Musculoskeletal:        General: Tenderness present.     Comments: Patient does not have any tenderness over her right clavicle.  She states her pain is in her right shoulder area.  It is hard to tell she has swelling in that area.  She is nontender at the elbow, forearm, or in her hands.  She has good range of motion of her fingers and describes normal sensation.  She has good distal pulses.  Skin:    General: Skin is warm and dry.     Coloration: Skin is pale.  Neurological:     General: No focal deficit present.     Mental Status: She is alert and oriented to person, place, and time.  Psychiatric:        Mood and Affect: Mood normal.        Behavior: Behavior normal.        Thought Content: Thought content normal.      ED Treatments / Results  Labs (all labs ordered are listed, but only abnormal results are displayed) Labs Reviewed - No data to display  EKG None  Radiology Dg Shoulder Right  Result Date: 02/11/2019 CLINICAL DATA:  Right shoulder pain after a fall tonight EXAM: RIGHT SHOULDER - 2+ VIEW COMPARISON:  None. FINDINGS: Mildly comminuted fracture of the humeral shaft with mild angulation. A sharp fracture fragment is present anteriorly and medially.  Osteopenic appearance. Located glenohumeral and acromioclavicular joints. IMPRESSION: 1. Humeral shaft fracture as described on dedicated exam. 2. Located shoulder. Electronically Signed   By: Monte Fantasia M.D.   On: 02/11/2019 05:02   Dg Humerus Right  Result Date: 02/11/2019 CLINICAL DATA:  Fall with right arm pain EXAM: RIGHT HUMERUS - 2+ VIEW  COMPARISON:  None. FINDINGS: Mildly comminuted fracture of the mid humeral shaft with apex ventral and medial angulation. A sharp bone fragment is present medially. Located glenohumeral and acromioclavicular joints. IMPRESSION: Comminuted fracture of the humeral shaft with fracture likely extending to the surgical neck. A sharp bone fragment is present medially. Electronically Signed   By: Monte Fantasia M.D.   On: 02/11/2019 05:03    Procedures Procedures (including critical care time)  Medications Ordered in ED Medications  fentaNYL (SUBLIMAZE) injection 50 mcg (50 mcg Intramuscular Given 02/11/19 0425)  oxyCODONE-acetaminophen (PERCOCET/ROXICET) 5-325 MG per tablet 1 tablet (1 tablet Oral Given 02/11/19 0503)     Initial Impression / Assessment and Plan / ED Course  I have reviewed the triage vital signs and the nursing notes.  Pertinent labs & imaging results that were available during my care of the patient were reviewed by me and considered in my medical decision making (see chart for details).        Patient was given fentanyl IM prior to going to x-ray.  When she returned we gave her oral Percocet prior to putting on her splint.  She was observed to make sure she did okay because she had an allergy to morphine.  Patient was noted to be sleeping after the Percocet.  She was placed in the long-arm splint and placed in a sling.  She was discharged back to her facility to follow-up with Dr. Aline Brochure.  Sometimes this fracture may require surgery and that will be up to Dr. Aline Brochure.   Final Clinical Impressions(s) / ED Diagnoses   Final diagnoses:  Closed displaced comminuted fracture of shaft of right humerus, initial encounter    ED Discharge Orders         Ordered    oxyCODONE-acetaminophen (PERCOCET/ROXICET) 5-325 MG tablet  Every 6 hours PRN     02/11/19 0638         Plan discharge  Rolland Porter, MD, Barbette Or, MD 02/11/19 [id number]

## 2019-02-11 NOTE — ED Notes (Signed)
Report given to Shaiterria at [facility] at this time. Waiting on EMS for transport back to Skilled nursing facility.

## 2019-02-11 NOTE — Discharge Instructions (Addendum)
Have her wear the splint and sling for comfort.  Give her Percocet as needed for severe pain.  Ice packs to the area to help reduce swelling.  Please call Dr. Ruthe Mannan office to get a follow-up appointment.  [name] needs to be seen sooner if [name] gets numbness in her fingers, pain in her fingers, her fingers get cold or change colors such as being extremely pale.

## 2019-02-12 ENCOUNTER — Emergency Department (HOSPITAL_COMMUNITY): Payer: Medicare HMO

## 2019-02-12 ENCOUNTER — Encounter (HOSPITAL_COMMUNITY): Payer: Self-pay | Admitting: Emergency Medicine

## 2019-02-12 ENCOUNTER — Inpatient Hospital Stay (HOSPITAL_COMMUNITY)
Admission: EM | Admit: 2019-02-12 | Discharge: 2019-02-21 | DRG: 291 | Disposition: A | Discharge status: Hospice | Payer: Medicare HMO | Attending: Family Medicine | Admitting: Family Medicine

## 2019-02-12 ENCOUNTER — Other Ambulatory Visit: Payer: Self-pay

## 2019-02-12 DIAGNOSIS — E11319 Type 2 diabetes mellitus with unspecified diabetic retinopathy without macular edema: Secondary | ICD-10-CM | POA: Diagnosis present

## 2019-02-12 DIAGNOSIS — I350 Nonrheumatic aortic (valve) stenosis: Secondary | ICD-10-CM

## 2019-02-12 DIAGNOSIS — M1990 Unspecified osteoarthritis, unspecified site: Secondary | ICD-10-CM | POA: Diagnosis present

## 2019-02-12 DIAGNOSIS — I4891 Unspecified atrial fibrillation: Secondary | ICD-10-CM | POA: Diagnosis present

## 2019-02-12 DIAGNOSIS — I132 Hypertensive heart and chronic kidney disease with heart failure and with stage 5 chronic kidney disease, or end stage renal disease: Secondary | ICD-10-CM | POA: Diagnosis present

## 2019-02-12 DIAGNOSIS — E785 Hyperlipidemia, unspecified: Secondary | ICD-10-CM | POA: Diagnosis present

## 2019-02-12 DIAGNOSIS — Z515 Encounter for palliative care: Secondary | ICD-10-CM | POA: Diagnosis not present

## 2019-02-12 DIAGNOSIS — I480 Paroxysmal atrial fibrillation: Secondary | ICD-10-CM | POA: Diagnosis not present

## 2019-02-12 DIAGNOSIS — I679 Cerebrovascular disease, unspecified: Secondary | ICD-10-CM | POA: Diagnosis present

## 2019-02-12 DIAGNOSIS — K5730 Diverticulosis of large intestine without perforation or abscess without bleeding: Secondary | ICD-10-CM | POA: Diagnosis not present

## 2019-02-12 DIAGNOSIS — E1122 Type 2 diabetes mellitus with diabetic chronic kidney disease: Secondary | ICD-10-CM | POA: Diagnosis not present

## 2019-02-12 DIAGNOSIS — I509 Heart failure, unspecified: Secondary | ICD-10-CM | POA: Diagnosis not present

## 2019-02-12 DIAGNOSIS — E1165 Type 2 diabetes mellitus with hyperglycemia: Secondary | ICD-10-CM | POA: Diagnosis not present

## 2019-02-12 DIAGNOSIS — N390 Urinary tract infection, site not specified: Secondary | ICD-10-CM | POA: Diagnosis not present

## 2019-02-12 DIAGNOSIS — I10 Essential (primary) hypertension: Secondary | ICD-10-CM | POA: Diagnosis not present

## 2019-02-12 DIAGNOSIS — J45909 Unspecified asthma, uncomplicated: Secondary | ICD-10-CM | POA: Diagnosis present

## 2019-02-12 DIAGNOSIS — K7460 Unspecified cirrhosis of liver: Secondary | ICD-10-CM | POA: Diagnosis present

## 2019-02-12 DIAGNOSIS — D509 Iron deficiency anemia, unspecified: Secondary | ICD-10-CM | POA: Diagnosis present

## 2019-02-12 DIAGNOSIS — Z7189 Other specified counseling: Secondary | ICD-10-CM

## 2019-02-12 DIAGNOSIS — K589 Irritable bowel syndrome without diarrhea: Secondary | ICD-10-CM | POA: Diagnosis present

## 2019-02-12 DIAGNOSIS — I5031 Acute diastolic (congestive) heart failure: Secondary | ICD-10-CM

## 2019-02-12 DIAGNOSIS — S42351A Displaced comminuted fracture of shaft of humerus, right arm, initial encounter for closed fracture: Secondary | ICD-10-CM | POA: Diagnosis present

## 2019-02-12 DIAGNOSIS — G9341 Metabolic encephalopathy: Secondary | ICD-10-CM | POA: Diagnosis present

## 2019-02-12 DIAGNOSIS — Z9049 Acquired absence of other specified parts of digestive tract: Secondary | ICD-10-CM

## 2019-02-12 DIAGNOSIS — Y92122 Bedroom in nursing home as the place of occurrence of the external cause: Secondary | ICD-10-CM | POA: Diagnosis not present

## 2019-02-12 DIAGNOSIS — Z9181 History of falling: Secondary | ICD-10-CM | POA: Diagnosis not present

## 2019-02-12 DIAGNOSIS — Z8249 Family history of ischemic heart disease and other diseases of the circulatory system: Secondary | ICD-10-CM

## 2019-02-12 DIAGNOSIS — F05 Delirium due to known physiological condition: Secondary | ICD-10-CM | POA: Diagnosis not present

## 2019-02-12 DIAGNOSIS — N184 Chronic kidney disease, stage 4 (severe): Secondary | ICD-10-CM | POA: Diagnosis present

## 2019-02-12 DIAGNOSIS — Z853 Personal history of malignant neoplasm of breast: Secondary | ICD-10-CM

## 2019-02-12 DIAGNOSIS — Z9842 Cataract extraction status, left eye: Secondary | ICD-10-CM

## 2019-02-12 DIAGNOSIS — Z794 Long term (current) use of insulin: Secondary | ICD-10-CM

## 2019-02-12 DIAGNOSIS — I482 Chronic atrial fibrillation, unspecified: Secondary | ICD-10-CM | POA: Diagnosis not present

## 2019-02-12 DIAGNOSIS — R4182 Altered mental status, unspecified: Secondary | ICD-10-CM | POA: Diagnosis not present

## 2019-02-12 DIAGNOSIS — R0902 Hypoxemia: Secondary | ICD-10-CM | POA: Diagnosis not present

## 2019-02-12 DIAGNOSIS — N189 Chronic kidney disease, unspecified: Secondary | ICD-10-CM | POA: Diagnosis present

## 2019-02-12 DIAGNOSIS — I110 Hypertensive heart disease with heart failure: Secondary | ICD-10-CM | POA: Diagnosis not present

## 2019-02-12 DIAGNOSIS — Z87891 Personal history of nicotine dependence: Secondary | ICD-10-CM

## 2019-02-12 DIAGNOSIS — Z961 Presence of intraocular lens: Secondary | ICD-10-CM | POA: Diagnosis present

## 2019-02-12 DIAGNOSIS — Z66 Do not resuscitate: Secondary | ICD-10-CM | POA: Diagnosis present

## 2019-02-12 DIAGNOSIS — W06XXXA Fall from bed, initial encounter: Secondary | ICD-10-CM | POA: Diagnosis present

## 2019-02-12 DIAGNOSIS — R000 Tachycardia, unspecified: Secondary | ICD-10-CM | POA: Diagnosis present

## 2019-02-12 DIAGNOSIS — Z8673 Personal history of transient ischemic attack (TIA), and cerebral infarction without residual deficits: Secondary | ICD-10-CM

## 2019-02-12 DIAGNOSIS — I130 Hypertensive heart and chronic kidney disease with heart failure and stage 1 through stage 4 chronic kidney disease, or unspecified chronic kidney disease: Secondary | ICD-10-CM | POA: Diagnosis not present

## 2019-02-12 DIAGNOSIS — N179 Acute kidney failure, unspecified: Secondary | ICD-10-CM | POA: Diagnosis not present

## 2019-02-12 DIAGNOSIS — Z87440 Personal history of urinary (tract) infections: Secondary | ICD-10-CM

## 2019-02-12 DIAGNOSIS — I5021 Acute systolic (congestive) heart failure: Secondary | ICD-10-CM

## 2019-02-12 DIAGNOSIS — E538 Deficiency of other specified B group vitamins: Secondary | ICD-10-CM | POA: Diagnosis present

## 2019-02-12 DIAGNOSIS — Z7982 Long term (current) use of aspirin: Secondary | ICD-10-CM

## 2019-02-12 DIAGNOSIS — I5033 Acute on chronic diastolic (congestive) heart failure: Secondary | ICD-10-CM | POA: Diagnosis not present

## 2019-02-12 DIAGNOSIS — Z888 Allergy status to other drugs, medicaments and biological substances status: Secondary | ICD-10-CM

## 2019-02-12 DIAGNOSIS — I5043 Acute on chronic combined systolic (congestive) and diastolic (congestive) heart failure: Secondary | ICD-10-CM | POA: Diagnosis present

## 2019-02-12 DIAGNOSIS — R279 Unspecified lack of coordination: Secondary | ICD-10-CM | POA: Diagnosis not present

## 2019-02-12 DIAGNOSIS — I5041 Acute combined systolic (congestive) and diastolic (congestive) heart failure: Secondary | ICD-10-CM | POA: Diagnosis not present

## 2019-02-12 DIAGNOSIS — Z7401 Bed confinement status: Secondary | ICD-10-CM | POA: Diagnosis not present

## 2019-02-12 DIAGNOSIS — M797 Fibromyalgia: Secondary | ICD-10-CM | POA: Diagnosis not present

## 2019-02-12 DIAGNOSIS — Z885 Allergy status to narcotic agent status: Secondary | ICD-10-CM

## 2019-02-12 DIAGNOSIS — R69 Illness, unspecified: Secondary | ICD-10-CM | POA: Diagnosis not present

## 2019-02-12 DIAGNOSIS — R531 Weakness: Secondary | ICD-10-CM | POA: Diagnosis not present

## 2019-02-12 DIAGNOSIS — Z9841 Cataract extraction status, right eye: Secondary | ICD-10-CM

## 2019-02-12 DIAGNOSIS — Z79899 Other long term (current) drug therapy: Secondary | ICD-10-CM

## 2019-02-12 DIAGNOSIS — Z9109 Other allergy status, other than to drugs and biological substances: Secondary | ICD-10-CM

## 2019-02-12 DIAGNOSIS — R5381 Other malaise: Secondary | ICD-10-CM | POA: Diagnosis not present

## 2019-02-12 LAB — CBC WITH DIFFERENTIAL/PLATELET
Abs Immature Granulocytes: 0.02 10*3/uL (ref 0.00–0.07)
Basophils Absolute: 0 10*3/uL (ref 0.0–0.1)
Basophils Relative: 0 %
Eosinophils Absolute: 0 10*3/uL (ref 0.0–0.5)
Eosinophils Relative: 1 %
HCT: 32.3 % — ABNORMAL LOW (ref 36.0–46.0)
Hemoglobin: 9.1 g/dL — ABNORMAL LOW (ref 12.0–15.0)
Immature Granulocytes: 0 %
Lymphocytes Relative: 17 %
Lymphs Abs: 1.3 10*3/uL (ref 0.7–4.0)
MCH: 23.8 pg — ABNORMAL LOW (ref 26.0–34.0)
MCHC: 28.2 g/dL — ABNORMAL LOW (ref 30.0–36.0)
MCV: 84.6 fL (ref 80.0–100.0)
Monocytes Absolute: 0.8 10*3/uL (ref 0.1–1.0)
Monocytes Relative: 11 %
NEUTROS PCT: 71 %
Neutro Abs: 5.5 10*3/uL (ref 1.7–7.7)
Platelets: 154 10*3/uL (ref 150–400)
RBC: 3.82 MIL/uL — AB (ref 3.87–5.11)
RDW: 17.2 % — ABNORMAL HIGH (ref 11.5–15.5)
WBC: 7.7 10*3/uL (ref 4.0–10.5)
nRBC: 0 % (ref 0.0–0.2)

## 2019-02-12 LAB — URINALYSIS, ROUTINE W REFLEX MICROSCOPIC
Bilirubin Urine: NEGATIVE
Glucose, UA: NEGATIVE mg/dL
Hgb urine dipstick: NEGATIVE
Ketones, ur: NEGATIVE mg/dL
Leukocytes,Ua: NEGATIVE
Nitrite: NEGATIVE
Protein, ur: NEGATIVE mg/dL
SPECIFIC GRAVITY, URINE: 1.011 (ref 1.005–1.030)
pH: 5 (ref 5.0–8.0)

## 2019-02-12 LAB — COMPREHENSIVE METABOLIC PANEL
ALT: 12 U/L (ref 0–44)
AST: 21 U/L (ref 15–41)
Albumin: 2.8 g/dL — ABNORMAL LOW (ref 3.5–5.0)
Alkaline Phosphatase: 29 U/L — ABNORMAL LOW (ref 38–126)
Anion gap: 7 (ref 5–15)
BUN: 60 mg/dL — ABNORMAL HIGH (ref 8–23)
CALCIUM: 8.9 mg/dL (ref 8.9–10.3)
CO2: 30 mmol/L (ref 22–32)
Chloride: 107 mmol/L (ref 98–111)
Creatinine, Ser: 2.57 mg/dL — ABNORMAL HIGH (ref 0.44–1.00)
GFR calc Af Amer: 19 mL/min — ABNORMAL LOW (ref >60)
GFR, EST NON AFRICAN AMERICAN: 17 mL/min — AB (ref >60)
Glucose, Bld: 224 mg/dL — ABNORMAL HIGH (ref 70–99)
Potassium: 4.2 mmol/L (ref 3.5–5.1)
Sodium: 144 mmol/L (ref 135–145)
Total Bilirubin: 0.3 mg/dL (ref 0.3–1.2)
Total Protein: 5.8 g/dL — ABNORMAL LOW (ref 6.5–8.1)

## 2019-02-12 LAB — GLUCOSE, CAPILLARY: Glucose-Capillary: 169 mg/dL — ABNORMAL HIGH (ref 70–99)

## 2019-02-12 LAB — BRAIN NATRIURETIC PEPTIDE: B NATRIURETIC PEPTIDE 5: 1383 pg/mL — AB (ref 0.0–100.0)

## 2019-02-12 LAB — TROPONIN I: Troponin I: 0.03 ng/mL (ref <0.03)

## 2019-02-12 LAB — MRSA PCR SCREENING: MRSA by PCR: POSITIVE — AB

## 2019-02-12 MED ORDER — HYDROCODONE-ACETAMINOPHEN 10-325 MG PO TABS
1.0000 | ORAL_TABLET | Freq: Two times a day (BID) | ORAL | Status: DC | PRN
  Administered 2019-02-12 – 2019-02-21 (×16): 1 via ORAL
  Filled 2019-02-12 (×18): qty 1

## 2019-02-12 MED ORDER — CHLORHEXIDINE GLUCONATE CLOTH 2 % EX PADS
6.0000 | MEDICATED_PAD | Freq: Every day | CUTANEOUS | Status: AC
  Administered 2019-02-13 – 2019-02-17 (×4): 6 via TOPICAL

## 2019-02-12 MED ORDER — FUROSEMIDE 10 MG/ML IJ SOLN
40.0000 mg | Freq: Two times a day (BID) | INTRAMUSCULAR | Status: DC
  Administered 2019-02-12 – 2019-02-18 (×12): 40 mg via INTRAVENOUS
  Filled 2019-02-12 (×12): qty 4

## 2019-02-12 MED ORDER — MUPIROCIN 2 % EX OINT
1.0000 "application " | TOPICAL_OINTMENT | Freq: Two times a day (BID) | CUTANEOUS | Status: AC
  Administered 2019-02-13 – 2019-02-17 (×9): 1 via NASAL
  Filled 2019-02-12 (×4): qty 22

## 2019-02-12 MED ORDER — SODIUM CHLORIDE 0.9% FLUSH
3.0000 mL | INTRAVENOUS | Status: DC | PRN
  Administered 2019-02-18: 3 mL via INTRAVENOUS
  Filled 2019-02-12: qty 3

## 2019-02-12 MED ORDER — INSULIN ASPART 100 UNIT/ML ~~LOC~~ SOLN
0.0000 [IU] | Freq: Three times a day (TID) | SUBCUTANEOUS | Status: DC
  Administered 2019-02-13: 1 [IU] via SUBCUTANEOUS

## 2019-02-12 MED ORDER — POTASSIUM CHLORIDE CRYS ER 20 MEQ PO TBCR
20.0000 meq | EXTENDED_RELEASE_TABLET | Freq: Every day | ORAL | Status: DC
  Administered 2019-02-13 – 2019-02-21 (×9): 20 meq via ORAL
  Filled 2019-02-12 (×10): qty 1

## 2019-02-12 MED ORDER — ASPIRIN EC 81 MG PO TBEC
81.0000 mg | DELAYED_RELEASE_TABLET | Freq: Every day | ORAL | Status: DC
  Administered 2019-02-13 – 2019-02-21 (×9): 81 mg via ORAL
  Filled 2019-02-12 (×9): qty 1

## 2019-02-12 MED ORDER — SODIUM CHLORIDE 0.9% FLUSH
3.0000 mL | Freq: Two times a day (BID) | INTRAVENOUS | Status: DC
  Administered 2019-02-12 – 2019-02-20 (×14): 3 mL via INTRAVENOUS

## 2019-02-12 MED ORDER — FUROSEMIDE 10 MG/ML IJ SOLN
40.0000 mg | Freq: Once | INTRAMUSCULAR | Status: AC
  Administered 2019-02-12: 40 mg via INTRAVENOUS
  Filled 2019-02-12: qty 4

## 2019-02-12 MED ORDER — FENTANYL CITRATE (PF) 100 MCG/2ML IJ SOLN
50.0000 ug | Freq: Once | INTRAMUSCULAR | Status: AC
  Administered 2019-02-12: 50 ug via INTRAVENOUS
  Filled 2019-02-12: qty 2

## 2019-02-12 MED ORDER — SODIUM CHLORIDE 0.9 % IV SOLN: 250.0000 mL | INTRAVENOUS | Status: DC | PRN

## 2019-02-12 MED ORDER — METOPROLOL TARTRATE 25 MG PO TABS
37.5000 mg | ORAL_TABLET | Freq: Two times a day (BID) | ORAL | Status: DC
  Administered 2019-02-12 – 2019-02-17 (×10): 37.5 mg via ORAL
  Filled 2019-02-12 (×10): qty 2

## 2019-02-12 MED ORDER — ONDANSETRON HCL 4 MG/2ML IJ SOLN: 4.0000 mg | Freq: Four times a day (QID) | INTRAMUSCULAR | Status: DC | PRN

## 2019-02-12 MED ORDER — CEPHALEXIN 500 MG PO CAPS
500.0000 mg | ORAL_CAPSULE | Freq: Three times a day (TID) | ORAL | Status: DC
  Administered 2019-02-12 – 2019-02-17 (×15): 500 mg via ORAL
  Filled 2019-02-12 (×15): qty 1

## 2019-02-12 MED ORDER — ALBUTEROL SULFATE (2.5 MG/3ML) 0.083% IN NEBU: 3.0000 mL | INHALATION_SOLUTION | Freq: Four times a day (QID) | RESPIRATORY_TRACT | Status: DC | PRN

## 2019-02-12 MED ORDER — ACETAMINOPHEN 325 MG PO TABS
650.0000 mg | ORAL_TABLET | ORAL | Status: DC | PRN
  Administered 2019-02-13 – 2019-02-17 (×3): 650 mg via ORAL
  Filled 2019-02-12 (×3): qty 2

## 2019-02-12 NOTE — ED Notes (Signed)
ED TO INPATIENT HANDOFF REPORT  ED Nurse Name and Phone #:  Lonn Georgia [phone] S Name/Age/Gender [name] 85 y.o. female Room/Bed: APA06/APA06  Code Status   Code Status: Full Code  Home/SNF/Other Nursing Home Patient oriented to: self, place, time and situation at times  Is this baseline? Yes   Triage Complete: Triage complete  Chief Complaint ams,fever  Triage Note Pt sent from [facility] with fever, ams and increased falls pt is ao x3    Allergies Allergies  Allergen Reactions  . Mold Extract [Trichophyton Mentagrophyte] Other (See Comments)    Reaction:  Itchy, watery eyes/sneezing   . Morphine And Related Other (See Comments)    Reaction:  Drowsiness. "I didn't wake up for almost 1 week" after taking morphine.   . Niaspan [Niacin] Other (See Comments)    Reaction:  Flushing   . Pollen Extract Other (See Comments)    Reaction:  Itchy, watery eyes/sneezing     Level of Care/Admitting Diagnosis ED Disposition    ED Disposition Condition [facility] Area: [facility] [[id number]]  Level of Care: Med-Surg [16]  Diagnosis: Acute CHF (congestive heart failure) [facility]) [[id number]]  Admitting Physician: Phillips Grout [[id number]]  Attending Physician: Derrill Kay A [[id number]]  Estimated length of stay: past midnight tomorrow  Certification:: I certify this patient will need inpatient services for at least 2 midnights  PT Class (Do Not Modify): Inpatient [101]  PT Acc Code (Do Not Modify): Private [1]       B Medical/Surgery History Past Medical History:  Diagnosis Date  . Anemia   . Aortic stenosis    Mild  . Arthritis   . Asthma   . Atrial flutter ([place]) 2002  . B12 deficiency 06/20/2015  . B12 deficiency 06/20/2015  . Breast carcinoma ([place])   . Cerebrovascular disease    with a 01% LICA; repeat study in 10/2009-no obstructive disease; modest ASVD  . Chronic kidney disease    Creatinine-1.5 in 2010 and 2.5-3 in 2011; 1.5-10/2010;  Klebsiella  UTI-10/2010. urine protein 36 mg/dl, mildly elevated  . Decreased bone density   . Depression   . Diabetes mellitus   . Diabetic retinopathy   . DJD (degenerative joint disease) 11/14/2011  . Falls infrequently 01/2010   fracture of pelvis and left humerus  . Fibromyalgia   . Headaches, cluster   . Hyperlipidemia   . Hypertension   . IBS (irritable bowel syndrome)    diverticulosis, gastroesophageal reflux disease  . Lower back pain   . Obesity 11/14/2011  . Spinal stenosis    Past Surgical History:  Procedure Laterality Date  . APPENDECTOMY    . CATARACT EXTRACTION, BILATERAL     Lens implants  . CENTRAL VENOUS CATHETER TUNNELED INSERTION SINGLE LUMEN    . CHOLECYSTECTOMY    . COLONOSCOPY  2012  . EYE SURGERY  nov 2012   for diabetic retinopathy  . LUMBAR DISC SURGERY     lumbosacral spine procedure x 2  . MASTECTOMY     Right breast for carcinoma     A IV Location/Drains/Wounds Patient Lines/Drains/Airways Status   Active Line/Drains/Airways    Name:   Placement date:   Placement time:   Site:   Days:   Implanted Port 11/10/16 Left Chest   11/10/16    2300    Chest   824   Peripheral IV 02/12/19 Left Hand   02/12/19    1759    Hand  less than 1   External Urinary Catheter   01/04/19    0030    -   39   Pressure Injury 02/16/17 Stage II -  Partial thickness loss of dermis presenting as a shallow open ulcer with a red, pink wound bed without slough. 2X1.5CM    02/16/17    0558     726          Intake/Output Last 24 hours No intake or output data in the 24 hours ending 02/12/19 1953  Labs/Imaging Results for orders placed or performed during the hospital encounter of 02/12/19 (from the past 48 hour(s))  Comprehensive metabolic panel     Status: Abnormal   Collection Time: 02/12/19  3:39 PM  Result Value Ref Range   Sodium 144 135 - 145 mmol/L   Potassium 4.2 3.5 - 5.1 mmol/L   Chloride 107 98 - 111 mmol/L   CO2 30 22 - 32 mmol/L   Glucose, Bld 224 (H) 70 -  99 mg/dL   BUN 60 (H) 8 - 23 mg/dL   Creatinine, Ser 2.57 (H) 0.44 - 1.00 mg/dL   Calcium 8.9 8.9 - 10.3 mg/dL   Total Protein 5.8 (L) 6.5 - 8.1 g/dL   Albumin 2.8 (L) 3.5 - 5.0 g/dL   AST 21 15 - 41 U/L   ALT 12 0 - 44 U/L   Alkaline Phosphatase 29 (L) 38 - 126 U/L   Total Bilirubin 0.3 0.3 - 1.2 mg/dL   GFR calc non Af Amer 17 (L) >60 mL/min   GFR calc Af Amer 19 (L) >60 mL/min   Anion gap 7 5 - 15    Comment: Performed at [facility], [address], [place], [place] [address]  CBC with Differential     Status: Abnormal   Collection Time: 02/12/19  3:39 PM  Result Value Ref Range   WBC 7.7 4.0 - 10.5 K/uL   RBC 3.82 (L) 3.87 - 5.11 MIL/uL   Hemoglobin 9.1 (L) 12.0 - 15.0 g/dL   HCT 32.3 (L) 36.0 - 46.0 %   MCV 84.6 80.0 - 100.0 fL   MCH 23.8 (L) 26.0 - 34.0 pg   MCHC 28.2 (L) 30.0 - 36.0 g/dL   RDW 17.2 (H) 11.5 - 15.5 %   Platelets 154 150 - 400 K/uL   nRBC 0.0 0.0 - 0.2 %   Neutrophils Relative % 71 %   Neutro Abs 5.5 1.7 - 7.7 K/uL   Lymphocytes Relative 17 %   Lymphs Abs 1.3 0.7 - 4.0 K/uL   Monocytes Relative 11 %   Monocytes Absolute 0.8 0.1 - 1.0 K/uL   Eosinophils Relative 1 %   Eosinophils Absolute 0.0 0.0 - 0.5 K/uL   Basophils Relative 0 %   Basophils Absolute 0.0 0.0 - 0.1 K/uL   Immature Granulocytes 0 %   Abs Immature Granulocytes 0.02 0.00 - 0.07 K/uL    Comment: Performed at [facility], [address], [place], [place] [address]  Brain natriuretic peptide     Status: Abnormal   Collection Time: 02/12/19  3:39 PM  Result Value Ref Range   B Natriuretic Peptide 1,383.0 (H) 0.0 - 100.0 pg/mL    Comment: Performed at [facility], [address] [place], [place] [address]  Troponin I - Once     Status: Abnormal   Collection Time: 02/12/19  3:39 PM  Result Value Ref Range   Troponin I 0.03 (HH) <0.03 ng/mL    Comment:  CRITICAL RESULT CALLED TO, READ BACK BY AND VERIFIED WITH: [facility] ON 02/12/19 AT 1700 BY LOY,C Performed at [facility],  [address] [place], [place] [address]   Urinalysis, Routine w reflex microscopic     Status: None   Collection Time: 02/12/19  5:34 PM  Result Value Ref Range   Color, Urine YELLOW YELLOW   APPearance CLEAR CLEAR   Specific Gravity, Urine 1.011 1.005 - 1.030   pH 5.0 5.0 - 8.0   Glucose, UA NEGATIVE NEGATIVE mg/dL   Hgb urine dipstick NEGATIVE NEGATIVE   Bilirubin Urine NEGATIVE NEGATIVE   Ketones, ur NEGATIVE NEGATIVE mg/dL   Protein, ur NEGATIVE NEGATIVE mg/dL   Nitrite NEGATIVE NEGATIVE   Leukocytes,Ua NEGATIVE NEGATIVE    Comment: Performed at [facility], [address] [place], [place] [address]   Ct Abdomen Pelvis Wo Contrast  Result Date: 02/12/2019 CLINICAL DATA:  85 year old female with history of abdominal distension, fever and altered mental status. Increasing falls. EXAM: CT ABDOMEN AND PELVIS WITHOUT CONTRAST TECHNIQUE: Multidetector CT imaging of the abdomen and pelvis was performed following the standard protocol without IV contrast. COMPARISON:  CT the abdomen and pelvis 11/10/2016. FINDINGS: Lower chest: Cardiomegaly. Atherosclerotic calcifications in the descending thoracic aorta as well as the left anterior descending, left circumflex and right coronary arteries. Calcifications of the aortic valve and mitral annulus. Moderate left pleural effusion. Areas of passive atelectasis are noted in the left lung base. Edema throughout the lower left chest wall and visualized portions of the left breast. Hepatobiliary: No definite suspicious cystic or solid hepatic lesions are confidently identified on today's noncontrast CT examination. Status post cholecystectomy. Pancreas: Pancreatic atrophy. No definite pancreatic mass or peripancreatic fluid or inflammatory changes. Spleen: Unremarkable. Adrenals/Urinary Tract: 2.3 cm low-attenuation lesion in the medial aspect of the upper pole of the left kidney, incompletely characterized on today's noncontrast CT examination, but statistically  likely a cyst. Unenhanced appearance of the right kidney is normal. No hydroureteronephrosis. Urinary bladder is unremarkable. Stomach/Bowel: Normal appearance of the stomach. No pathologic dilatation of small bowel or colon. Numerous colonic diverticulae are noted. Due to surrounding ascites, accurate assessment for focal inflammation indicative of acute diverticulitis is not possible on today's examination, but no focal inflammatory changes are confidently identified given these limitations. Appendix is not confidently identified may be surgically absent. Vascular/Lymphatic: Aortic atherosclerosis. No lymphadenopathy noted in the abdomen or pelvis. Reproductive: Status post hysterectomy. Ovaries are not confidently identified may be surgically absent or atrophic. Other: Small volume of ascites. No pneumoperitoneum. Diffuse body wall edema. Musculoskeletal: Status post PLIF from L3-L5 with interbody grafts at L3-L4 and L4-L5. There are no aggressive appearing lytic or blastic lesions noted in the visualized portions of the skeleton. IMPRESSION: 1. Small volume of ascites, left pleural effusion and diffuse body wall edema; imaging findings suggestive of a state of anasarca. 2. No other acute findings are confidently identified in the abdomen or pelvis to account for the patient's symptoms. 3. Cardiomegaly. 4. Aortic atherosclerosis, in addition to least 3 vessel coronary artery disease. 5. There are calcifications of the aortic valve and mitral annulus. Echocardiographic correlation for evaluation of potential valvular dysfunction may be warranted if clinically indicated. 6. Colonic diverticulosis. Electronically Signed   By: Vinnie Langton M.D.   On: 02/12/2019 16:50   Dg Chest 1 View  Result Date: 02/12/2019 CLINICAL DATA:  Acute presentation with weakness. EXAM: CHEST  1 VIEW COMPARISON:  01/03/2019 FINDINGS: Artifact overlies the chest. Left Port-A-Cath tip in the  SVC above the right atrium. Right lung  remains clear. There is worsened infiltrate and volume loss in the left lower lobe. There is probably a left effusion as well. IMPRESSION: Worsening of disease on the left. Probable left lower lobe pneumonia and collapse. Suspected left effusion. Electronically Signed   By: Nelson Chimes M.D.   On: 02/12/2019 16:33   Dg Shoulder Right  Result Date: 02/11/2019 CLINICAL DATA:  Right shoulder pain after a fall tonight EXAM: RIGHT SHOULDER - 2+ VIEW COMPARISON:  None. FINDINGS: Mildly comminuted fracture of the humeral shaft with mild angulation. A sharp fracture fragment is present anteriorly and medially. Osteopenic appearance. Located glenohumeral and acromioclavicular joints. IMPRESSION: 1. Humeral shaft fracture as described on dedicated exam. 2. Located shoulder. Electronically Signed   By: Monte Fantasia M.D.   On: 02/11/2019 05:02   Dg Humerus Right  Result Date: 02/11/2019 CLINICAL DATA:  Fall with right arm pain EXAM: RIGHT HUMERUS - 2+ VIEW COMPARISON:  None. FINDINGS: Mildly comminuted fracture of the mid humeral shaft with apex ventral and medial angulation. A sharp bone fragment is present medially. Located glenohumeral and acromioclavicular joints. IMPRESSION: Comminuted fracture of the humeral shaft with fracture likely extending to the surgical neck. A sharp bone fragment is present medially. Electronically Signed   By: Monte Fantasia M.D.   On: 02/11/2019 05:03    Pending Labs Unresulted Labs (From admission, onward)    Start     Ordered   02/13/19 2751  Basic metabolic panel  Daily,   R     02/12/19 1952          Vitals/Pain Today's Vitals   02/12/19 1448 02/12/19 1700 02/12/19 1941 02/12/19 1950  BP: 123/77 102/83  (!) 133/102  Pulse: (!) 108 (!) 117    Resp: 18 (!) 22  16  Temp: 98 F (36.7 C)     TempSrc: Oral     SpO2: 93% 92%    Weight:      Height:      PainSc:   Asleep     Isolation Precautions No active isolations  Medications Medications  aspirin EC  tablet 81 mg (has no administration in time range)  cephALEXin (KEFLEX) capsule 500 mg (has no administration in time range)  HYDROcodone-acetaminophen (NORCO) 10-325 MG per tablet 1 tablet (has no administration in time range)  Metoprolol Tartrate TABS 37.5 mg (has no administration in time range)  potassium chloride SA (K-DUR,KLOR-CON) CR tablet 20 mEq (has no administration in time range)  albuterol (PROVENTIL HFA;VENTOLIN HFA) 108 (90 Base) MCG/ACT inhaler 2 puff (has no administration in time range)  sodium chloride flush (NS) 0.9 % injection 3 mL (has no administration in time range)  sodium chloride flush (NS) 0.9 % injection 3 mL (has no administration in time range)  0.9 %  sodium chloride infusion (has no administration in time range)  acetaminophen (TYLENOL) tablet 650 mg (has no administration in time range)  ondansetron (ZOFRAN) injection 4 mg (has no administration in time range)  furosemide (LASIX) injection 40 mg (has no administration in time range)  insulin aspart (novoLOG) injection 0-9 Units (has no administration in time range)  furosemide (LASIX) injection 40 mg (40 mg Intravenous Given 02/12/19 1800)  fentaNYL (SUBLIMAZE) injection 50 mcg (50 mcg Intravenous Given 02/12/19 1800)    Mobility non-ambulatory High fall risk   Focused Assessments   , Pulmonary Assessment Handoff:  Lung sounds:   O2 Device: Room Air  R Recommendations: See Admitting Provider Note  Report given to:   Additional Notes:

## 2019-02-12 NOTE — ED Notes (Signed)
Pt returned from xray

## 2019-02-12 NOTE — ED Triage Notes (Signed)
Pt sent from [facility] with fever, ams and increased falls pt is ao x3

## 2019-02-12 NOTE — H&P (Signed)
History and Physical    [name] WJX:[id number] DOB: 14-Apr-1933 DOA: 02/12/2019  PCP: Elfredia Nevins, MD  Patient coming from: Nursing home  Chief Complaint: Altered mental status  HPI: [name] is a 85 y.o. female with medical history significant of chronic kidney disease with a baseline creatinine of 1.8, congestive heart failure, obesity, cirrhosis comes in with over a week of worsening swelling in her abdomen and lower extremities.  Her Lasix was recently switched to [facility] due to worsening renal failure.  Patient reports she is swollen much more since this change her medication.  She denies any shortness of breath or any fevers.  Patient be referred for admission for anasarca with volume overload.  Review of Systems: As per HPI otherwise 10 point review of systems negative.   Past Medical History:  Diagnosis Date   Anemia    Aortic stenosis    Mild   Arthritis    Asthma    Atrial flutter (HCC) 2002   B12 deficiency 06/20/2015   B12 deficiency 06/20/2015   Breast carcinoma (HCC)    Cerebrovascular disease    with a 60% LICA; repeat study in 10/2009-no obstructive disease; modest ASVD   Chronic kidney disease    Creatinine-1.5 in 2010 and 2.5-3 in 2011; 1.5-10/2010;  Klebsiella UTI-10/2010. urine protein 36 mg/dl, mildly elevated   Decreased bone density    Depression    Diabetes mellitus    Diabetic retinopathy    DJD (degenerative joint disease) 11/14/2011   Falls infrequently 01/2010   fracture of pelvis and left humerus   Fibromyalgia    Headaches, cluster    Hyperlipidemia    Hypertension    IBS (irritable bowel syndrome)    diverticulosis, gastroesophageal reflux disease   Lower back pain    Obesity 11/14/2011   Spinal stenosis     Past Surgical History:  Procedure Laterality Date   APPENDECTOMY     CATARACT EXTRACTION, BILATERAL     Lens implants   CENTRAL VENOUS CATHETER TUNNELED INSERTION SINGLE LUMEN      CHOLECYSTECTOMY     COLONOSCOPY  2012   EYE SURGERY  nov 2012   for diabetic retinopathy   LUMBAR DISC SURGERY     lumbosacral spine procedure x 2   MASTECTOMY     Right breast for carcinoma     reports that she quit smoking about 38 years ago. Her smoking use included cigarettes. She has a 30.00 pack-year smoking history. She has never used smokeless tobacco. She reports that she does not drink alcohol or use drugs.  Allergies  Allergen Reactions   Mold Extract [Trichophyton Mentagrophyte] Other (See Comments)    Reaction:  Itchy, watery eyes/sneezing    Morphine And Related Other (See Comments)    Reaction:  Drowsiness. "I didn't wake up for almost 1 week" after taking morphine.    Niaspan [Niacin] Other (See Comments)    Reaction:  Flushing    Pollen Extract Other (See Comments)    Reaction:  Itchy, watery eyes/sneezing     Family History  Problem Relation Age of Onset   Alzheimer's disease Mother    Heart attack Father    Aneurysm Sister    Coronary artery disease Brother     Prior to Admission medications   Medication Sig Start Date End Date Taking? Authorizing Provider  albuterol (PROAIR HFA) 108 (90 BASE) MCG/ACT inhaler Inhale 2 puffs into the lungs every 6 (six) hours as needed for wheezing or shortness of  breath. 06/16/14  Yes Ellouise Newer, PA-C  aspirin EC 81 MG tablet Take 81 mg by mouth daily.   Yes [provider]  cephALEXin (KEFLEX) 500 MG capsule Take 500 mg by mouth every 8 (eight) hours. 7 day course starting on 02/10/2019 for cellulitis   Yes [provider]  clonazePAM (KLONOPIN) 0.5 MG tablet Take 0.5 mg by mouth daily as needed for anxiety.  07/15/18  Yes [provider]  esomeprazole (NEXIUM) 40 MG capsule Take 40 mg by mouth daily.    Yes [provider]  fenofibrate (TRICOR) 145 MG tablet Take 145 mg by mouth every evening.    Yes [provider]  fluconazole (DIFLUCAN) 150 MG tablet Take 150  mg by mouth every 3 (three) days. Starting on 02/10/2019 for a total of 2 total doses to be completed on 02/13/2019   Yes [provider]  glipiZIDE (GLUCOTROL) 10 MG tablet Take 10 mg by mouth 2 (two) times daily before a meal.  08/08/18  Yes [provider]  HYDROcodone-acetaminophen (NORCO) 10-325 MG per tablet Take 1 tablet by mouth 2 (two) times daily as needed for moderate pain.    Yes [provider]  insulin aspart (NOVOLOG FLEXPEN) 100 UNIT/ML FlexPen Inject 8 Units into the skin 3 (three) times daily with meals. 02/17/17  Yes Johnson, Clanford L, MD  Insulin Glargine (BASAGLAR KWIKPEN) 100 UNIT/ML SOPN Inject 12 Units into the skin at bedtime.   Yes [provider]  loperamide (IMODIUM) 2 MG capsule Take 2 mg by mouth daily as needed for diarrhea or loose stools.  08/13/18  Yes [provider]  metoprolol tartrate 37.5 MG TABS Take 37.5 mg by mouth 2 (two) times daily. 01/08/19  Yes Erick Blinks, MD  nystatin (MYCOSTATIN/NYSTOP) powder Apply 1 g topically 3 (three) times daily. Apply to abdominal fold topically every day and evening shift for candidas   Yes [provider]  PARoxetine (PAXIL) 40 MG tablet Take 40 mg by mouth daily.    Yes [provider]  potassium chloride SA (K-DUR,KLOR-CON) 20 MEQ tablet Take 20 mEq by mouth daily.    Yes [provider]  pregabalin (LYRICA) 50 MG capsule Take 50 mg by mouth 3 (three) times daily.    Yes [provider]  raloxifene (EVISTA) 60 MG tablet Take 60 mg by mouth daily.   Yes [provider]  rosuvastatin (CRESTOR) 40 MG tablet Take 1 tablet (40 mg total) by mouth at bedtime. 07/20/15  Yes Laqueta Linden, MD  sitaGLIPtin (JANUVIA) 25 MG tablet Take 25 mg by mouth daily.   Yes [provider]  torsemide (DEMADEX) 20 MG tablet Take 40 mg by mouth daily.   Yes [provider]    Physical Exam: Vitals:   02/12/19 1446 02/12/19 1448  02/12/19 1700 02/12/19 1950  BP:  123/77 102/83 (!) 133/102  Pulse:  (!) 108 (!) 117   Resp:  18 (!) 22 16  Temp:  98 F (36.7 C)    TempSrc:  Oral    SpO2:  93% 92%   Weight: 111.5 kg     Height: 5\' 11"  (1.803 m)         Constitutional: NAD, calm, comfortable Vitals:   02/12/19 1446 02/12/19 1448 02/12/19 1700 02/12/19 1950  BP:  123/77 102/83 (!) 133/102  Pulse:  (!) 108 (!) 117   Resp:  18 (!) 22 16  Temp:  98 F (36.7 C)  TempSrc:  Oral    SpO2:  93% 92%   Weight: 111.5 kg     Height: 5\' 11"  (1.803 m)      Eyes: PERRL, lids and conjunctivae normal ENMT: Mucous membranes are moist. Posterior pharynx clear of any exudate or lesions.Normal dentition.  Neck: normal, supple, no masses, no thyromegaly Respiratory: clear to auscultation bilaterally, no wheezing, no crackles. Normal respiratory effort. No accessory muscle use.  Cardiovascular: Regular rate and rhythm, no murmurs / rubs / gallops.  2-3+ extremity edema. 2+ pedal pulses. No carotid bruits.  Abdomen: no tenderness, no masses palpated. No hepatosplenomegaly. Bowel sounds positive.  Edema and pannus Musculoskeletal: no clubbing / cyanosis. No joint deformity upper and lower extremities. Good ROM, no contractures. Normal muscle tone.  Skin: no rashes, lesions, ulcers. No induration Neurologic: CN 2-12 grossly intact. Sensation intact, DTR normal. Strength 5/5 in all 4.  Psychiatric: Normal judgment and insight. Alert and oriented x 3. Normal mood.    Labs on Admission: I have personally reviewed following labs and imaging studies  CBC: Recent Labs  Lab 02/12/19 1539  WBC 7.7  NEUTROABS 5.5  HGB 9.1*  HCT 32.3*  MCV 84.6  PLT 154   Basic Metabolic Panel: Recent Labs  Lab 02/12/19 1539  NA 144  K 4.2  CL 107  CO2 30  GLUCOSE 224*  BUN 60*  CREATININE 2.57*  CALCIUM 8.9   GFR: Estimated Creatinine Clearance: 22.8 mL/min (A) (by C-G formula based on SCr of 2.57 mg/dL (H)). Liver Function  Tests: Recent Labs  Lab 02/12/19 1539  AST 21  ALT 12  ALKPHOS 29*  BILITOT 0.3  PROT 5.8*  ALBUMIN 2.8*   No results for input(s): LIPASE, AMYLASE in the last 168 hours. No results for input(s): AMMONIA in the last 168 hours. Coagulation Profile: No results for input(s): INR, PROTIME in the last 168 hours. Cardiac Enzymes: Recent Labs  Lab 02/12/19 1539  TROPONINI 0.03*   BNP (last 3 results) No results for input(s): PROBNP in the last 8760 hours. HbA1C: No results for input(s): HGBA1C in the last 72 hours. CBG: No results for input(s): GLUCAP in the last 168 hours. Lipid Profile: No results for input(s): CHOL, HDL, LDLCALC, TRIG, CHOLHDL, LDLDIRECT in the last 72 hours. Thyroid Function Tests: No results for input(s): TSH, T4TOTAL, FREET4, T3FREE, THYROIDAB in the last 72 hours. Anemia Panel: No results for input(s): VITAMINB12, FOLATE, FERRITIN, TIBC, IRON, RETICCTPCT in the last 72 hours. Urine analysis:    Component Value Date/Time   COLORURINE YELLOW 02/12/2019 1734   APPEARANCEUR CLEAR 02/12/2019 1734   LABSPEC 1.011 02/12/2019 1734   PHURINE 5.0 02/12/2019 1734   GLUCOSEU NEGATIVE 02/12/2019 1734   GLUCOSEU neg 04/07/2010   HGBUR NEGATIVE 02/12/2019 1734   BILIRUBINUR NEGATIVE 02/12/2019 1734   KETONESUR NEGATIVE 02/12/2019 1734   PROTEINUR NEGATIVE 02/12/2019 1734   UROBILINOGEN 0.2 09/27/2014 1630   NITRITE NEGATIVE 02/12/2019 1734   LEUKOCYTESUR NEGATIVE 02/12/2019 1734   Sepsis Labs: !!!!!!!!!!!!!!!!!!!!!!!!!!!!!!!!!!!!!!!!!!!! @LABRCNTIP (procalcitonin:4,lacticidven:4) )No results found for this or any previous visit (from the past 240 hour(s)).   Radiological Exams on Admission: Ct Abdomen Pelvis Wo Contrast  Result Date: 02/12/2019 CLINICAL DATA:  85 year old female with history of abdominal distension, fever and altered mental status. Increasing falls. EXAM: CT ABDOMEN AND PELVIS WITHOUT CONTRAST TECHNIQUE: Multidetector CT imaging of the  abdomen and pelvis was performed following the standard protocol without IV contrast. COMPARISON:  CT the abdomen and pelvis 11/10/2016. FINDINGS: Lower chest: Cardiomegaly. Atherosclerotic  calcifications in the descending thoracic aorta as well as the left anterior descending, left circumflex and right coronary arteries. Calcifications of the aortic valve and mitral annulus. Moderate left pleural effusion. Areas of passive atelectasis are noted in the left lung base. Edema throughout the lower left chest wall and visualized portions of the left breast. Hepatobiliary: No definite suspicious cystic or solid hepatic lesions are confidently identified on today's noncontrast CT examination. Status post cholecystectomy. Pancreas: Pancreatic atrophy. No definite pancreatic mass or peripancreatic fluid or inflammatory changes. Spleen: Unremarkable. Adrenals/Urinary Tract: 2.3 cm low-attenuation lesion in the medial aspect of the upper pole of the left kidney, incompletely characterized on today's noncontrast CT examination, but statistically likely a cyst. Unenhanced appearance of the right kidney is normal. No hydroureteronephrosis. Urinary bladder is unremarkable. Stomach/Bowel: Normal appearance of the stomach. No pathologic dilatation of small bowel or colon. Numerous colonic diverticulae are noted. Due to surrounding ascites, accurate assessment for focal inflammation indicative of acute diverticulitis is not possible on today's examination, but no focal inflammatory changes are confidently identified given these limitations. Appendix is not confidently identified may be surgically absent. Vascular/Lymphatic: Aortic atherosclerosis. No lymphadenopathy noted in the abdomen or pelvis. Reproductive: Status post hysterectomy. Ovaries are not confidently identified may be surgically absent or atrophic. Other: Small volume of ascites. No pneumoperitoneum. Diffuse body wall edema. Musculoskeletal: Status post PLIF from L3-L5  with interbody grafts at L3-L4 and L4-L5. There are no aggressive appearing lytic or blastic lesions noted in the visualized portions of the skeleton. IMPRESSION: 1. Small volume of ascites, left pleural effusion and diffuse body wall edema; imaging findings suggestive of a state of anasarca. 2. No other acute findings are confidently identified in the abdomen or pelvis to account for the patient's symptoms. 3. Cardiomegaly. 4. Aortic atherosclerosis, in addition to least 3 vessel coronary artery disease. 5. There are calcifications of the aortic valve and mitral annulus. Echocardiographic correlation for evaluation of potential valvular dysfunction may be warranted if clinically indicated. 6. Colonic diverticulosis. Electronically Signed   By: Trudie Reed M.D.   On: 02/12/2019 16:50   Dg Chest 1 View  Result Date: 02/12/2019 CLINICAL DATA:  Acute presentation with weakness. EXAM: CHEST  1 VIEW COMPARISON:  01/03/2019 FINDINGS: Artifact overlies the chest. Left Port-A-Cath tip in the SVC above the right atrium. Right lung remains clear. There is worsened infiltrate and volume loss in the left lower lobe. There is probably a left effusion as well. IMPRESSION: Worsening of disease on the left. Probable left lower lobe pneumonia and collapse. Suspected left effusion. Electronically Signed   By: Paulina Fusi M.D.   On: 02/12/2019 16:33   Dg Shoulder Right  Result Date: 02/11/2019 CLINICAL DATA:  Right shoulder pain after a fall tonight EXAM: RIGHT SHOULDER - 2+ VIEW COMPARISON:  None. FINDINGS: Mildly comminuted fracture of the humeral shaft with mild angulation. A sharp fracture fragment is present anteriorly and medially. Osteopenic appearance. Located glenohumeral and acromioclavicular joints. IMPRESSION: 1. Humeral shaft fracture as described on dedicated exam. 2. Located shoulder. Electronically Signed   By: Marnee Spring M.D.   On: 02/11/2019 05:02   Dg Humerus Right  Result Date:  02/11/2019 CLINICAL DATA:  Fall with right arm pain EXAM: RIGHT HUMERUS - 2+ VIEW COMPARISON:  None. FINDINGS: Mildly comminuted fracture of the mid humeral shaft with apex ventral and medial angulation. A sharp bone fragment is present medially. Located glenohumeral and acromioclavicular joints. IMPRESSION: Comminuted fracture of the humeral shaft with fracture likely extending  to the surgical neck. A sharp bone fragment is present medially. Electronically Signed   By: Marnee Spring M.D.   On: 02/11/2019 05:03   Old chart reviewed Case discussed with EDP  Assessment/Plan 85 year old female with acute on chronic congestive heart failure exacerbation with worsening renal failure Principal Problem:   Acute CHF (congestive heart failure) (HCC)-placed on Lasix 40 mg IV every 12 hours.  Accurate I's and O's daily weights.  CHF pathway.  Monitor renal function closely while diuresing  Active Problems:   Acute kidney injury superimposed on chronic kidney disease (HCC)-creatinine bump up to 2.5.  Baseline is 1.8.  Obtain nephrology consultation in the setting of worsening renal failure and congestive heart failure    Atrial fibrillation (HCC)-currently rate controlled    Essential hypertension-continue home meds    Aortic stenosis-noted    Fibromyalgia-continue home meds    Falls infrequently-noted    DVT prophylaxis: SCDs Code Status: DNR Family Communication: None Disposition Plan: Days Consults called: Nephrology Admission status: Admission   Keilon Ressel A MD [facility]  If 7PM-7AM, please contact night-coverage [email or web address] Password [facility]  02/12/2019, 7:54 PM  \

## 2019-02-12 NOTE — ED Notes (Signed)
Son left. Gave updates.

## 2019-02-12 NOTE — ED Provider Notes (Signed)
[facility] EMERGENCY DEPARTMENT Provider Note   CSN: [id number] Arrival date & time: 02/12/19  1436    History   Chief Complaint Chief Complaint  Patient presents with  . Altered Mental Status  . Fever  . Fall    HPI [name] is a 85 y.o. female.     Patient is an 85 year old female with past medical history of atrial flutter prior CVA, chronic renal insufficiency, diabetes, hypertension, irritable bowel.  She presents today for evaluation of fever and confusion.  She stays at an extended care facility where she was found today with low-grade fever, weakness, and not quite acting herself.  The patient denies any specific symptoms such as abdominal pain, chest pain, cough, headache, or other issues.  Of note is that she was seen 2 mornings ago after a fall.  She was found to have a fractured humerus and is currently wearing a splint.  Family present at bedside.  They are concerned about swelling in her ankles and feel as though her upper abdomen is distended.  The history is provided by the patient.  Altered Mental Status  Presenting symptoms: disorientation   Severity:  Moderate Most recent episode:  Today Episode history:  Continuous Timing:  Constant Progression:  Unchanged Chronicity:  New   Past Medical History:  Diagnosis Date  . Anemia   . Aortic stenosis    Mild  . Arthritis   . Asthma   . Atrial flutter ([place]) 2002  . B12 deficiency 06/20/2015  . B12 deficiency 06/20/2015  . Breast carcinoma ([place])   . Cerebrovascular disease    with a 62% LICA; repeat study in 10/2009-no obstructive disease; modest ASVD  . Chronic kidney disease    Creatinine-1.5 in 2010 and 2.5-3 in 2011; 1.5-10/2010;  Klebsiella UTI-10/2010. urine protein 36 mg/dl, mildly elevated  . Decreased bone density   . Depression   . Diabetes mellitus   . Diabetic retinopathy   . DJD (degenerative joint disease) 11/14/2011  . Falls infrequently 01/2010   fracture of pelvis and left humerus   . Fibromyalgia   . Headaches, cluster   . Hyperlipidemia   . Hypertension   . IBS (irritable bowel syndrome)    diverticulosis, gastroesophageal reflux disease  . Lower back pain   . Obesity 11/14/2011  . Spinal stenosis     Patient Active Problem List   Diagnosis Date Noted  . Acute lower UTI (urinary tract infection) 01/03/2019  . Type 2 diabetes mellitus ([place]) 01/03/2019  . CKD stage 3 due to type 2 diabetes mellitus ([place]) 01/03/2019  . Hypoglycemia 02/16/2017  . Acute metabolic encephalopathy 22/97/9892  . Pressure injury of skin 02/16/2017  . Acute kidney injury superimposed on chronic kidney disease ([place]) 11/21/2016  . Hyperglycemia 11/21/2016  . Acute cystitis without hematuria 11/21/2016  . Diarrhea of presumed infectious origin 11/21/2016  . UTI (urinary tract infection) 11/10/2016  . Colitis 11/10/2016  . Elevated troponin I level 11/10/2016  . B12 deficiency 06/20/2015  . UTI (lower urinary tract infection) 05/21/2014  . DJD (degenerative joint disease) 11/14/2011  . Obesity 11/14/2011  . Inflammatory carcinoma of right breast   . Anemia   . IBS (irritable bowel syndrome)   . Essential hypertension   . Aortic stenosis   . Chronic kidney disease   . Fibromyalgia   . Cerebrovascular disease   . Atrial fibrillation ([place]) 07/14/2010  . Falls infrequently 01/31/2010  . Hyperlipidemia 11/15/2009  . Type 2 DM with CKD stage 4  and hypertension ([place]) 10/12/2009  . DEPRESSION/ANXIETY 10/12/2009  . SPINAL STENOSIS, LUMBAR 10/12/2009  . Atrial flutter ([place]) 12/03/2000    Past Surgical History:  Procedure Laterality Date  . APPENDECTOMY    . CATARACT EXTRACTION, BILATERAL     Lens implants  . CENTRAL VENOUS CATHETER TUNNELED INSERTION SINGLE LUMEN    . CHOLECYSTECTOMY    . COLONOSCOPY  2012  . EYE SURGERY  nov 2012   for diabetic retinopathy  . LUMBAR DISC SURGERY     lumbosacral spine procedure x 2  . MASTECTOMY     Right breast for carcinoma     OB  History   No obstetric history on file.      Home Medications    Prior to Admission medications   Medication Sig Start Date End Date Taking? Authorizing Provider  albuterol (PROAIR HFA) 108 (90 BASE) MCG/ACT inhaler Inhale 2 puffs into the lungs every 6 (six) hours as needed for wheezing or shortness of breath. 06/16/14   Baird Cancer, PA-C  aspirin EC 81 MG tablet Take 81 mg by mouth daily.    [provider]  clonazePAM (KLONOPIN) 0.5 MG tablet Take 0.5 mg by mouth daily as needed for anxiety.  07/15/18   [provider]  cyanocobalamin (,VITAMIN B-12,) 1000 MCG/ML injection Inject 1,000 mcg into the muscle every 30 (thirty) days.    [provider]  Diclofenac Sodium 3 % GEL Apply 1 application topically 3 (three) times daily. 01/30/17   [provider]  esomeprazole (NEXIUM) 40 MG capsule Take 40 mg by mouth daily.     [provider]  fenofibrate (TRICOR) 145 MG tablet Take 145 mg by mouth daily.    [provider]  furosemide (LASIX) 40 MG tablet Take 1 tablet (40 mg total) by mouth daily. Patient taking differently: Take 80 mg by mouth 3 (three) times daily.  11/23/16   Thurnell Lose, MD  glipiZIDE (GLUCOTROL) 10 MG tablet Take 10 mg by mouth 2 (two) times daily before a meal.  08/08/18   [provider]  HYDROcodone-acetaminophen (NORCO) 10-325 MG per tablet Take 1 tablet by mouth 2 (two) times daily as needed for moderate pain.     [provider]  insulin aspart (NOVOLOG FLEXPEN) 100 UNIT/ML FlexPen Inject 8 Units into the skin 3 (three) times daily with meals. Patient taking differently: Inject 20 Units into the skin 2 (two) times daily.  02/17/17   Johnson, Clanford L, MD  Insulin Degludec (TRESIBA FLEXTOUCH) 200 UNIT/ML SOPN Inject 12 Units into the skin at bedtime. Patient taking differently: Inject 20 Units into the skin at bedtime.  02/17/17   Johnson, Clanford L, MD  loperamide (IMODIUM) 2 MG capsule  Take 2 mg by mouth as needed for diarrhea or loose stools.  08/13/18   [provider]  metoprolol tartrate 37.5 MG TABS Take 37.5 mg by mouth 2 (two) times daily. 01/08/19   Kathie Dike, MD  oxyCODONE-acetaminophen (PERCOCET/ROXICET) 5-325 MG tablet Take 1 tablet by mouth every 6 (six) hours as needed for severe pain. 02/11/19   Rolland Porter, MD  PARoxetine (PAXIL) 40 MG tablet Take 40 mg by mouth daily.     [provider]  potassium chloride SA (K-DUR,KLOR-CON) 20 MEQ tablet Take 20 mEq by mouth daily as needed (for low potassium).    [provider]  pregabalin (LYRICA) 50 MG capsule Take 50 mg by mouth 3 (three) times daily.     [provider]  raloxifene (EVISTA) 60 MG tablet Take 60 mg by mouth daily.    [provider]  rosuvastatin (CRESTOR) 40 MG tablet Take 1 tablet (40 mg total) by mouth at bedtime. 07/20/15   Herminio Commons, MD  sitaGLIPtin (JANUVIA) 25 MG tablet Take 25 mg by mouth daily.    [provider]    Family History Family History  Problem Relation Age of Onset  . Alzheimer's disease Mother   . Heart attack Father   . Aneurysm Sister   . Coronary artery disease Brother     Social History Social History   Tobacco Use  . Smoking status: Former Smoker    Packs/day: 1.00    Years: 30.00    Pack years: 30.00    Types: Cigarettes    Last attempt to quit: 1982    Years since quitting: 38.2  . Smokeless tobacco: Never Used  Substance Use Topics  . Alcohol use: No  . Drug use: No     Allergies   Mold extract [trichophyton mentagrophyte]; Morphine and related; Niaspan [niacin]; and Pollen extract   Review of Systems Review of Systems  All other systems reviewed and are negative.    Physical Exam Updated Vital Signs BP 123/77 (BP Location: Right Wrist)   Pulse (!) 108   Temp 98 F (36.7 C) (Oral)   Resp 18   Ht $R'5\' 11"'dY$  (1.803 m)   Wt 111.5 kg   SpO2 93%   BMI 34.28 kg/m   Physical Exam  Vitals signs and nursing note reviewed.  Constitutional:      General: She is not in acute distress.    Appearance: She is well-developed. She is not diaphoretic.  HENT:     Head: Normocephalic and atraumatic.     Mouth/Throat:     Mouth: Mucous membranes are moist.  Neck:     Musculoskeletal: Normal range of motion and neck supple.  Cardiovascular:     Rate and Rhythm: Normal rate and regular rhythm.     Heart sounds: No murmur. No friction rub. No gallop.   Pulmonary:     Effort: Pulmonary effort is normal. No respiratory distress.     Breath sounds: Normal breath sounds. No wheezing.  Abdominal:     General: Bowel sounds are normal. There is no distension.     Palpations: Abdomen is soft.     Tenderness: There is no abdominal tenderness.  Musculoskeletal: Normal range of motion.        General: Swelling and tenderness present.     Right lower leg: Edema present.     Left lower leg: Edema present.     Comments: There is 2-3+ pitting edema of both lower extremities in a stocking distribution.  Skin:    General: Skin is warm and dry.  Neurological:     Mental Status: She is alert and oriented to person, place, and time.      ED Treatments / Results  Labs (all labs ordered are listed, but only abnormal results are displayed) Labs Reviewed - No data to display  EKG EKG Interpretation  Date/Time:  Thursday February 12 2019 14:44:10 EDT Ventricular Rate:  101 PR Interval:    QRS Duration: 96 QT Interval:  371 QTC Calculation: 481 R Axis:   71 Text Interpretation:  Atrial fibrillation Low voltage, precordial leads Confirmed by Veryl Speak [phone]) on 02/12/2019 2:52:22 PM   Radiology Dg Shoulder Right  Result Date: 02/11/2019 CLINICAL DATA:  Right shoulder pain  after a fall tonight EXAM: RIGHT SHOULDER - 2+ VIEW COMPARISON:  None. FINDINGS: Mildly comminuted fracture of the humeral shaft with mild angulation. A sharp fracture fragment is present anteriorly and medially.  Osteopenic appearance. Located glenohumeral and acromioclavicular joints. IMPRESSION: 1. Humeral shaft fracture as described on dedicated exam. 2. Located shoulder. Electronically Signed   By: Monte Fantasia M.D.   On: 02/11/2019 05:02   Dg Humerus Right  Result Date: 02/11/2019 CLINICAL DATA:  Fall with right arm pain EXAM: RIGHT HUMERUS - 2+ VIEW COMPARISON:  None. FINDINGS: Mildly comminuted fracture of the mid humeral shaft with apex ventral and medial angulation. A sharp bone fragment is present medially. Located glenohumeral and acromioclavicular joints. IMPRESSION: Comminuted fracture of the humeral shaft with fracture likely extending to the surgical neck. A sharp bone fragment is present medially. Electronically Signed   By: Monte Fantasia M.D.   On: 02/11/2019 05:03    Procedures Procedures (including critical care time)  Medications Ordered in ED Medications - No data to display   Initial Impression / Assessment and Plan / ED Course  I have reviewed the triage vital signs and the nursing notes.  Pertinent labs & imaging results that were available during my care of the patient were reviewed by me and considered in my medical decision making (see chart for details).  Patient with history of CHF and end-stage renal disease presenting with complaints of shortness of breath, swelling to her legs and abdomen, and weakness.  She fell yesterday, fracturing her right humerus.  Today's work-up shows worsening renal function with BUN of 60 and creatinine of 2.6.  Her BNP is also elevated at nearly 1400 and CT scan of her abdomen shows abdominal wall edema consistent with anasarca.  She also has significant swelling of both lower extremities.  Patient was given IV Lasix here in the ER.  Her oxygen saturations are borderline low.  Patient will be admitted to the hospitalist service for diuresis and further observation.  Dr. Shanon Brow agrees to admit.  Final Clinical Impressions(s) / ED  Diagnoses   Final diagnoses:  None    ED Discharge Orders    None       Veryl Speak, MD 02/12/19 2011

## 2019-02-13 ENCOUNTER — Telehealth: Payer: Self-pay | Admitting: Orthopedic Surgery

## 2019-02-13 DIAGNOSIS — N179 Acute kidney failure, unspecified: Secondary | ICD-10-CM

## 2019-02-13 DIAGNOSIS — I482 Chronic atrial fibrillation, unspecified: Secondary | ICD-10-CM

## 2019-02-13 DIAGNOSIS — N184 Chronic kidney disease, stage 4 (severe): Secondary | ICD-10-CM

## 2019-02-13 DIAGNOSIS — M797 Fibromyalgia: Secondary | ICD-10-CM

## 2019-02-13 DIAGNOSIS — I10 Essential (primary) hypertension: Secondary | ICD-10-CM

## 2019-02-13 DIAGNOSIS — I5031 Acute diastolic (congestive) heart failure: Secondary | ICD-10-CM

## 2019-02-13 LAB — GLUCOSE, CAPILLARY
GLUCOSE-CAPILLARY: 203 mg/dL — AB (ref 70–99)
GLUCOSE-CAPILLARY: 263 mg/dL — AB (ref 70–99)
Glucose-Capillary: 156 mg/dL — ABNORMAL HIGH (ref 70–99)
Glucose-Capillary: 257 mg/dL — ABNORMAL HIGH (ref 70–99)

## 2019-02-13 LAB — BASIC METABOLIC PANEL
Anion gap: 12 (ref 5–15)
BUN: 59 mg/dL — ABNORMAL HIGH (ref 8–23)
CO2: 27 mmol/L (ref 22–32)
Calcium: 9.1 mg/dL (ref 8.9–10.3)
Chloride: 106 mmol/L (ref 98–111)
Creatinine, Ser: 2.56 mg/dL — ABNORMAL HIGH (ref 0.44–1.00)
GFR calc Af Amer: 19 mL/min — ABNORMAL LOW (ref >60)
GFR calc non Af Amer: 17 mL/min — ABNORMAL LOW (ref >60)
Glucose, Bld: 172 mg/dL — ABNORMAL HIGH (ref 70–99)
Potassium: 4.9 mmol/L (ref 3.5–5.1)
Sodium: 145 mmol/L (ref 135–145)

## 2019-02-13 LAB — RESPIRATORY PANEL BY PCR
Adenovirus: NOT DETECTED
Bordetella pertussis: NOT DETECTED
CORONAVIRUS 229E-RVPPCR: NOT DETECTED
Chlamydophila pneumoniae: NOT DETECTED
Coronavirus HKU1: NOT DETECTED
Coronavirus NL63: NOT DETECTED
Coronavirus OC43: NOT DETECTED
Influenza A: NOT DETECTED
Influenza B: NOT DETECTED
MYCOPLASMA PNEUMONIAE-RVPPCR: NOT DETECTED
Metapneumovirus: NOT DETECTED
PARAINFLUENZA VIRUS 4-RVPPCR: NOT DETECTED
Parainfluenza Virus 1: NOT DETECTED
Parainfluenza Virus 2: NOT DETECTED
Parainfluenza Virus 3: NOT DETECTED
Respiratory Syncytial Virus: NOT DETECTED
Rhinovirus / Enterovirus: NOT DETECTED

## 2019-02-13 LAB — CK: Total CK: 35 U/L — ABNORMAL LOW (ref 38–234)

## 2019-02-13 LAB — HEMOGLOBIN A1C
Hgb A1c MFr Bld: 8.9 % — ABNORMAL HIGH (ref 4.8–5.6)
Mean Plasma Glucose: 208.73 mg/dL

## 2019-02-13 LAB — FOLATE: Folate: 10.5 ng/mL (ref >5.9)

## 2019-02-13 LAB — T4, FREE: Free T4: 0.94 ng/dL (ref 0.82–1.77)

## 2019-02-13 LAB — TSH: TSH: 3.249 u[IU]/mL (ref 0.350–4.500)

## 2019-02-13 LAB — VITAMIN B12: Vitamin B-12: 295 pg/mL (ref 180–914)

## 2019-02-13 LAB — AMMONIA: Ammonia: 14 umol/L (ref 9–35)

## 2019-02-13 MED ORDER — FENOFIBRATE 160 MG PO TABS: 160.0000 mg | ORAL_TABLET | Freq: Every day | ORAL | Status: DC

## 2019-02-13 MED ORDER — INSULIN ASPART 100 UNIT/ML ~~LOC~~ SOLN
0.0000 [IU] | Freq: Three times a day (TID) | SUBCUTANEOUS | Status: DC
  Administered 2019-02-13: 3 [IU] via SUBCUTANEOUS
  Administered 2019-02-13 – 2019-02-14 (×2): 5 [IU] via SUBCUTANEOUS
  Administered 2019-02-14: 2 [IU] via SUBCUTANEOUS
  Administered 2019-02-15: 3 [IU] via SUBCUTANEOUS
  Administered 2019-02-15: 2 [IU] via SUBCUTANEOUS
  Administered 2019-02-15: 3 [IU] via SUBCUTANEOUS
  Administered 2019-02-16 (×2): 2 [IU] via SUBCUTANEOUS
  Administered 2019-02-16: 3 [IU] via SUBCUTANEOUS
  Administered 2019-02-17: 2 [IU] via SUBCUTANEOUS
  Administered 2019-02-17: 3 [IU] via SUBCUTANEOUS

## 2019-02-13 MED ORDER — ROSUVASTATIN CALCIUM 10 MG PO TABS
10.0000 mg | ORAL_TABLET | Freq: Every day | ORAL | Status: DC
  Administered 2019-02-13 – 2019-02-20 (×7): 10 mg via ORAL
  Filled 2019-02-13 (×7): qty 1

## 2019-02-13 MED ORDER — INSULIN ASPART 100 UNIT/ML ~~LOC~~ SOLN
0.0000 [IU] | Freq: Every day | SUBCUTANEOUS | Status: DC
  Administered 2019-02-13: 3 [IU] via SUBCUTANEOUS
  Administered 2019-02-14: 2 [IU] via SUBCUTANEOUS

## 2019-02-13 MED ORDER — ROSUVASTATIN CALCIUM 20 MG PO TABS: 40.0000 mg | ORAL_TABLET | Freq: Every day | ORAL | Status: DC

## 2019-02-13 MED ORDER — PAROXETINE HCL 20 MG PO TABS
40.0000 mg | ORAL_TABLET | Freq: Every day | ORAL | Status: DC
  Administered 2019-02-13 – 2019-02-21 (×9): 40 mg via ORAL
  Filled 2019-02-13 (×9): qty 2

## 2019-02-13 MED ORDER — INSULIN GLARGINE 100 UNIT/ML ~~LOC~~ SOLN
6.0000 [IU] | Freq: Every day | SUBCUTANEOUS | Status: DC
  Administered 2019-02-13 – 2019-02-17 (×5): 6 [IU] via SUBCUTANEOUS
  Filled 2019-02-13 (×6): qty 0.06

## 2019-02-13 MED ORDER — PREGABALIN 50 MG PO CAPS
50.0000 mg | ORAL_CAPSULE | Freq: Every day | ORAL | Status: DC
  Administered 2019-02-13 – 2019-02-21 (×9): 50 mg via ORAL
  Filled 2019-02-13 (×9): qty 1

## 2019-02-13 NOTE — Plan of Care (Signed)
  Problem: Acute Rehab PT Goals(only PT should resolve) Goal: Pt will Roll Supine to Side Outcome: Progressing Flowsheets (Taken 02/13/2019 1103) Pt will Roll Supine to Side: with mod assist; with max assist; with rail Goal: Pt Will Go Supine/Side To Sit Outcome: Progressing Flowsheets (Taken 02/13/2019 1103) Pt will go Supine/Side to Sit: with moderate assist; with maximum assist; with HOB elevated Goal: Pt Will Go Sit To Supine/Side Outcome: Progressing Flowsheets (Taken 02/13/2019 1103) Pt will go Sit to Supine/Side: with moderate assist; with maximum assist; with HOB  elevated Goal: Patient Will Transfer Sit To/From Stand Outcome: Progressing Flowsheets (Taken 02/13/2019 1103) Patient will transfer sit to/from stand: with moderate assist; with maximum assist; from elevated surface Goal: Pt Will Transfer Bed To Chair/Chair To Bed Outcome: [place] D. Hartnett-Rands, MS, PT Per [facility] [phone] 02/13/2019

## 2019-02-13 NOTE — Progress Notes (Signed)
Patient was noted to be impacted when changing bed, was able to disimpact pt. She had medium amount of hard stool removed.

## 2019-02-13 NOTE — TOC Initial Note (Signed)
Transition of Care [facility]) - Initial/Assessment Note    Patient Details  Name: [name] MRN: [id number] Date of Birth: 06-04-1933  [facility]) CM/SW Contact:    Janila Arrazola, Chrystine Oiler, RN Phone Number: 02/13/2019, 3:16 PM  Clinical Narrative:        Patient is from [place]. She has a Medicaid app pending to be long term. Discussed returning with patient, she reports she would like to return home. We discuss that plan, she has no one to stay with her 24/7. She has a broken humerus and is max assist with ADL's per PT's notes today. She is agreeable to returning to SNF. She plans to discuss with SW at [place] a potential move to [facility] for long term care.            Expected Discharge Plan: Skilled Nursing Facility Barriers to Discharge: No Barriers Identified   Patient Goals and CMS Choice Patient states their goals for this hospitalization and ongoing recovery are:: patient wants to return home ideally, but is aware that she need SNF at this time, patient has a Medicaid pending application for LTC at [facility]      Expected Discharge Plan and Services Expected Discharge Plan: Skilled Nursing Facility                                Prior Living Arrangements/Services     Patient language and need for interpreter reviewed:: No Do you feel safe going back to the place where you live?: No               Activities of Daily Living Home Assistive Devices/Equipment: Bedside commode/3-in-1, Wheelchair, Environmental consultant (specify type) ADL Screening (condition at time of admission) Patient's cognitive ability adequate to safely complete daily activities?: Yes Is the patient deaf or have difficulty hearing?: No Does the patient have difficulty seeing, even when wearing glasses/contacts?: No Does the patient have difficulty concentrating, remembering, or making decisions?: Yes Patient able to express need for assistance with ADLs?: Yes Does the patient have  difficulty dressing or bathing?: Yes Independently performs ADLs?: No Communication: Independent Dressing (OT): Needs assistance Is this a change from baseline?: Pre-admission baseline Grooming: Needs assistance Is this a change from baseline?: Pre-admission baseline Feeding: Needs assistance Is this a change from baseline?: Pre-admission baseline Bathing: Needs assistance Is this a change from baseline?: Pre-admission baseline Toileting: Needs assistance Is this a change from baseline?: Pre-admission baseline In/Out Bed: Needs assistance Is this a change from baseline?: Pre-admission baseline Walks in Home: Needs assistance Is this a change from baseline?: Pre-admission baseline Does the patient have difficulty walking or climbing stairs?: Yes Weakness of Legs: Both Weakness of Arms/Hands: Both  Permission Sought/Granted   Permission granted to share information with : Yes, Verbal Permission Granted  Share Information with NAME: [place]           Emotional Assessment Appearance:: Appears stated age Attitude/Demeanor/Rapport: Engaged Affect (typically observed): Calm Orientation: : Oriented to Self, Oriented to Place, Oriented to  Time, Oriented to Situation      Admission diagnosis:  ams,fever Patient Active Problem List   Diagnosis Date Noted  . Acute renal failure superimposed on stage 4 chronic kidney disease (HCC) 02/13/2019  . Acute diastolic CHF (congestive heart failure) (HCC) 02/13/2019  . Acute CHF (congestive heart failure) (HCC) 02/12/2019  . Acute lower UTI (urinary tract infection) 01/03/2019  . Type 2 diabetes mellitus (HCC)  01/03/2019  . CKD stage 3 due to type 2 diabetes mellitus (HCC) 01/03/2019  . Hypoglycemia 02/16/2017  . Acute metabolic encephalopathy 02/16/2017  . Pressure injury of skin 02/16/2017  . Acute kidney injury superimposed on chronic kidney disease (HCC) 11/21/2016  . Hyperglycemia 11/21/2016  . Acute cystitis without hematuria  11/21/2016  . Diarrhea of presumed infectious origin 11/21/2016  . UTI (urinary tract infection) 11/10/2016  . Colitis 11/10/2016  . Elevated troponin I level 11/10/2016  . B12 deficiency 06/20/2015  . UTI (lower urinary tract infection) 05/21/2014  . DJD (degenerative joint disease) 11/14/2011  . Obesity 11/14/2011  . Inflammatory carcinoma of right breast   . Anemia   . IBS (irritable bowel syndrome)   . Essential hypertension   . Aortic stenosis   . Chronic kidney disease   . Fibromyalgia   . Cerebrovascular disease   . Atrial fibrillation (HCC) 07/14/2010  . Falls infrequently 01/31/2010  . Hyperlipidemia 11/15/2009  . 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 DEPRESSION/ANXIETY 10/12/2009  . SPINAL STENOSIS, LUMBAR 10/12/2009  . Atrial flutter (HCC) 12/03/2000   PCP:  Elfredia Nevins, MD Pharmacy:   [facility] [place], [place] - [address] [place] [address] Phone: [phone] Fax: [phone]     Social Determinants of Health (SDOH) Interventions    Readmission Risk Interventions 30 Day Unplanned Readmission Risk Score     ED to Hosp-Admission (Current) from 02/12/2019 in [facility] SURGICAL UNIT  30 Day Unplanned Readmission Risk Score (%)  27 Filed at 02/13/2019 1200     This score is the patient's risk of an unplanned readmission within 30 days of being discharged (0 -100%). The score is based on dignosis, age, lab data, medications, orders, and past utilization.   Low:  0-14.9   Medium: 15-21.9   High: 22-29.9   Extreme: 30 and above       Readmission Risk Prevention Plan 02/13/2019  Transportation Screening Complete  Social Work Consult for Recovery Care Planning/Counseling Complete  Palliative Care Screening Not Applicable  Medication Review Oceanographer) Complete  Some recent data might be hidden

## 2019-02-13 NOTE — Telephone Encounter (Signed)
Call received from [place], scheduler at [facility] facility(formerly [facility]) ph [phone], to cancel and re-schedule appointment for Monday, 02/16/19, due to facility now under medical lock down, until March 04, 2019 or until further notice. Appointment has been re-scheduled accordingly, which is a new patient visit for problem, fracture of humerus, in splint/sling, and no treatment yet.  [place] states patient is currently in-patient at [facility]; unsure at this time if orthopaedic consult has been requested.

## 2019-02-13 NOTE — [place] FL2 (Addendum)
[place] MEDICAID FL2 LEVEL OF CARE SCREENING TOOL     IDENTIFICATION  Patient Name: [name] Birthdate: [birth year] Sex: female Admission Date (Current Location): 02/12/2019  [facility] and [place] Number:  [employer] and Address:  [facility],  [address] [place] [address]      Provider Number: [id number]  Attending Physician Name and Address:  Catarina Hartshorn, MD  Relative Name and Phone Number:  Darrel Holtmeyer    Current Level of Care: SNF Recommended Level of Care: Skilled Nursing Facility Prior Approval Number:    Date Approved/Denied: 02/07/10 PASRR Number: [id number]  Discharge Plan: SNF    Current Diagnoses: Patient Active Problem List   Diagnosis Date Noted  . Acute renal failure superimposed on stage 4 chronic kidney disease (HCC) 02/13/2019  . Acute diastolic CHF (congestive heart failure) (HCC) 02/13/2019  . Acute CHF (congestive heart failure) (HCC) 02/12/2019  . Acute lower UTI (urinary tract infection) 01/03/2019  . Type 2 diabetes mellitus (HCC) 01/03/2019  . CKD stage 3 due to type 2 diabetes mellitus (HCC) 01/03/2019  . Hypoglycemia 02/16/2017  . Acute metabolic encephalopathy 02/16/2017  . Pressure injury of skin 02/16/2017  . Acute kidney injury superimposed on chronic kidney disease (HCC) 11/21/2016  . Hyperglycemia 11/21/2016  . Acute cystitis without hematuria 11/21/2016  . Diarrhea of presumed infectious origin 11/21/2016  . UTI (urinary tract infection) 11/10/2016  . Colitis 11/10/2016  . Elevated troponin I level 11/10/2016  . B12 deficiency 06/20/2015  . UTI (lower urinary tract infection) 05/21/2014  . DJD (degenerative joint disease) 11/14/2011  . Obesity 11/14/2011  . Inflammatory carcinoma of right breast   . Anemia   . IBS (irritable bowel syndrome)   . Essential hypertension   . Aortic stenosis   . Chronic kidney disease   . Fibromyalgia   . Cerebrovascular disease   . Atrial fibrillation (HCC)  07/14/2010  . Falls infrequently 01/31/2010  . Hyperlipidemia 11/15/2009  . 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 DEPRESSION/ANXIETY 10/12/2009  . SPINAL STENOSIS, LUMBAR 10/12/2009  . Atrial flutter (HCC) 12/03/2000    Orientation RESPIRATION BLADDER Height & Weight     Self, Time, Situation, Place  Normal External catheter Weight: 123.3 kg Height:  5\' 11"  (180.3 cm)  BEHAVIORAL SYMPTOMS/MOOD NEUROLOGICAL BOWEL NUTRITION STATUS      Continent Diet(heart healthy/carb modified)  AMBULATORY STATUS COMMUNICATION OF NEEDS Skin   Extensive Assist Verbally Normal                       Personal Care Assistance Level of Assistance  Bathing, Feeding, Dressing Bathing Assistance: Maximum assistance Feeding assistance: Limited assistance Dressing Assistance: Maximum assistance     Functional Limitations Info  Sight, Hearing, Speech Sight Info: Adequate Hearing Info: Adequate Speech Info: Adequate    SPECIAL CARE FACTORS FREQUENCY  PT (By licensed PT)     PT Frequency: 5x/week              Contractures Contractures Info: Not present    Additional Factors Info  Code Status, Allergies, Psychotropic(DNR) Code Status Info: DNR Allergies Info: Mold Extract trichophyton Mentagrophyte; Morphine ;Niaspan niacin; Pollen Extract Psychotropic Info: KLONOPIN; PAXIL         Current Medications (02/13/2019):  This is the current hospital active medication list Current Facility-Administered Medications  Medication Dose Route Frequency Provider Last Rate Last Dose  . 0.9 %  sodium chloride infusion  250 mL  Intravenous PRN Haydee Monica, MD      . acetaminophen (TYLENOL) tablet 650 mg  650 mg Oral Q4H PRN Tarry Kos A, MD      . albuterol (PROVENTIL) (2.5 MG/3ML) 0.083% nebulizer solution 3 mL  3 mL Inhalation Q6H PRN Haydee Monica, MD      . aspirin EC tablet 81 mg  81 mg Oral Daily Tarry Kos A, MD   81 mg at 02/13/19 0929  . cephALEXin  (KEFLEX) capsule 500 mg  500 mg Oral Q8H Tarry Kos A, MD   500 mg at 02/13/19 0607  . Chlorhexidine Gluconate Cloth 2 % PADS 6 each  6 each Topical Q0600 Haydee Monica, MD   6 each at 02/13/19 0610  . furosemide (LASIX) injection 40 mg  40 mg Intravenous Q12H Tarry Kos A, MD   40 mg at 02/13/19 0929  . HYDROcodone-acetaminophen (NORCO) 10-325 MG per tablet 1 tablet  1 tablet Oral BID PRN Haydee Monica, MD   1 tablet at 02/12/19 2120  . insulin aspart (novoLOG) injection 0-5 Units  0-5 Units Subcutaneous QHS Tat, David, MD      . insulin aspart (novoLOG) injection 0-9 Units  0-9 Units Subcutaneous TID WC Catarina Hartshorn, MD   3 Units at 02/13/19 1217  . insulin glargine (LANTUS) injection 6 Units  6 Units Subcutaneous Daily Tat, David, MD   6 Units at 02/13/19 1217  . metoprolol tartrate (LOPRESSOR) tablet 37.5 mg  37.5 mg Oral BID Tarry Kos A, MD   37.5 mg at 02/13/19 0929  . mupirocin ointment (BACTROBAN) 2 % 1 application  1 application Nasal BID Haydee Monica, MD   1 application at 02/13/19 0930  . ondansetron (ZOFRAN) injection 4 mg  4 mg Intravenous Q6H PRN Haydee Monica, MD      . PARoxetine (PAXIL) tablet 40 mg  40 mg Oral Daily Tat, David, MD   40 mg at 02/13/19 0929  . potassium chloride SA (K-DUR,KLOR-CON) CR tablet 20 mEq  20 mEq Oral Daily Tarry Kos A, MD   20 mEq at 02/13/19 0929  . pregabalin (LYRICA) capsule 50 mg  50 mg Oral Daily Tat, David, MD   50 mg at 02/13/19 0929  . rosuvastatin (CRESTOR) tablet 10 mg  10 mg Oral q1800 Tat, David, MD      . sodium chloride flush (NS) 0.9 % injection 3 mL  3 mL Intravenous Q12H Tarry Kos A, MD   3 mL at 02/13/19 0935  . sodium chloride flush (NS) 0.9 % injection 3 mL  3 mL Intravenous PRN Haydee Monica, MD       Facility-Administered Medications Ordered in Other Encounters  Medication Dose Route Frequency Provider Last Rate Last Dose  . sodium chloride 0.9 % injection 10 mL  10 mL Intravenous PRN Ellouise Newer,  PA-C   10 mL at 10/20/15 1205     Discharge Medications: Please see discharge summary for a list of discharge medications.  Relevant Imaging Results:  Relevant Lab Results:   Additional Information SSN [id number]  Kawanna Christley, Chrystine Oiler, RN

## 2019-02-13 NOTE — Evaluation (Signed)
Physical Therapy Evaluation Patient Details Name: [name] MRN: [id number] DOB: 1933/12/01 Today's Date: 02/13/2019   History of Present Illness  Patient is an 85 year old female admitted 02/12/2019 with diagnosis of acute CHF, anasarca. PMH: a fib, HTN, aortic stenosis, AKI, CKD, fibromyalgia, fall infrequently, obesity, cirrhosis, Rt sided breast cancer in remission, stroke, recent right humeral fracture. From [facility].    Clinical Impression  Patient presents lying in bed with nursing present. Patient agreeable to participating in evaluation. C/o right shoulder pain; ACE wrap, splint and sling donned. PT adjusted sling for more comfortable positioning and ergonomics. Patient max assist for bed mobility and transfers at this time. Patient quite weak and unable to use dominant right arm due to fracture. Pt admitted with above diagnosis. Pt currently with functional limitations due to the deficits listed below (see PT Problem List). Patient presents lying in bed with nursing present. Patient agreeable to participating in evaluation. C/o right shoulder pain; ACE wrap, splint and sling donned. PT adjusted sling for more comfortable positioning and ergonomics. Patient max assist for bed mobility and transfers at this time. Patient quite weak and unable to use dominant right arm due to fracture. Patient positioned in bed with bed alarm on at end of session. Nursing notified. Pt will benefit from skilled PT to increase their independence and safety with mobility to allow discharge to the venue listed below.       Follow Up Recommendations SNF;Supervision/Assistance - 24 hour    Equipment Recommendations  None recommended by PT    Recommendations for Other Services       Precautions / Restrictions Precautions Precautions: Shoulder Type of Shoulder Precautions: recent right humeral fracture - Dr Aline Brochure has been asked for a consult Restrictions Weight Bearing Restrictions: No Other  Position/Activity Restrictions: recent right humeral fracture - Dr Aline Brochure has been asked for a consult      Mobility  Bed Mobility Overal bed mobility: Needs Assistance Bed Mobility: Rolling;Supine to Sit;Sit to Supine Rolling: Max assist   Supine to sit: Max assist;HOB elevated Sit to supine: Total assist;Max assist   General bed mobility comments: unable to use Rt UE  Transfers Overall transfer level: Needs assistance Equipment used: None Transfers: Sit to/from Stand;Lateral/Scoot Transfers Sit to Stand: Max assist        Lateral/Scoot Transfers: Max assist    Ambulation/Gait             General Gait Details: not attempted this date  Stairs            Wheelchair Mobility    Modified Rankin (Stroke Patients Only)       Balance Overall balance assessment: Needs assistance Sitting-balance support: Single extremity supported;Feet supported Sitting balance-Leahy Scale: Fair         Standing balance comment: not attempted this date                             Pertinent Vitals/Pain Pain Assessment: 0-10 Pain Score: 7  Pain Location: right elbow Pain Descriptors / Indicators: Burning Pain Intervention(s): Limited activity within patient's tolerance;Monitored during session;Repositioned    Home Living Family/patient expects to be discharged to:: Skilled nursing facility                 Additional Comments: states her hospital bed is old and not able to use    Prior Function Level of Independence: Independent with assistive device(s);Needs assistance   Gait /  Transfers Assistance Needed: uses wheelchair mostly in house, can ambulate short household distances using RW  ADL's / Homemaking Assistance Needed: per patient report, assistance from [place] worker to get to appointments  Comments: Patient reports she was getting out of bed and walking to the bathroom recently at [facility].     Hand Dominance   Dominant  Hand: Right    Extremity/Trunk Assessment   Upper Extremity Assessment Upper Extremity Assessment: RUE deficits/detail;Generalized weakness RUE Deficits / Details: Right humeral fracture    Lower Extremity Assessment Lower Extremity Assessment: Generalized weakness       Communication   Communication: No difficulties  Cognition Arousal/Alertness: Awake/alert Behavior During Therapy: WFL for tasks assessed/performed Overall Cognitive Status: Within Functional Limits for tasks assessed                                 General Comments: uncertain if impairment at baseline.      General Comments      Exercises     Assessment/Plan    PT Assessment Patient needs continued PT services  PT Problem List Decreased strength;Decreased mobility;Decreased range of motion;Decreased activity tolerance;Pain       PT Treatment Interventions DME instruction;Therapeutic activities;Gait training;Therapeutic exercise;Patient/family education;Balance training;Neuromuscular re-education    PT Goals (Current goals can be found in the Care Plan section)  Acute Rehab PT Goals Patient Stated Goal: go home; not return to [facility] PT Goal Formulation: With patient Time For Goal Achievement: 02/27/19 Potential to Achieve Goals: Fair    Frequency Min 3X/week   Barriers to discharge        Co-evaluation               AM-PAC PT "6 Clicks" Mobility  Outcome Measure Help needed turning from your back to your side while in a flat bed without using bedrails?: A Lot Help needed moving from lying on your back to sitting on the side of a flat bed without using bedrails?: A Lot Help needed moving to and from a bed to a chair (including a wheelchair)?: Total Help needed standing up from a chair using your arms (e.g., wheelchair or bedside chair)?: Total Help needed to walk in hospital room?: Total Help needed climbing 3-5 steps with a railing? : Total 6 Click Score: 8     End of Session   Activity Tolerance: Patient limited by pain;Patient limited by fatigue Patient left: in bed;with call bell/phone within reach;with bed alarm set Nurse Communication: Mobility status PT Visit Diagnosis: Muscle weakness (generalized) (M62.81);Other abnormalities of gait and mobility (R26.89);Unsteadiness on feet (R26.81)    Time: 1010-1050 PT Time Calculation (min) (ACUTE ONLY): 40 min   Charges:   PT Evaluation $PT Eval Moderate Complexity: 1 Mod PT Treatments $Therapeutic Activity: 8-22 mins        Floria Raveling. Hartnett-Rands, MS, PT Per [place] [phone] 02/13/2019, 11:00 AM

## 2019-02-13 NOTE — Progress Notes (Signed)
PROGRESS NOTE  [name] ZES:[id number] DOB: [birth year] DOA: 02/12/2019 PCP: Redmond School, MD  Brief History:  85 year old female with a history of right-sided inflammatory breast cancer in remission, CKD stage IV, atrial fibrillation, stroke, diabetes mellitus with retinopathy, hypertension, hyperlipidemia, B12 deficiency, frequent falls presenting from [facility] with reported fever and altered mental status.  Unfortunately, the patient is a poor historian, and attempts to call the family were unsuccessful.  Nevertheless, the patient was sent to the emergency department for the above complaints.  The patient herself stated that she was told that she had confusion and fevers.  During evaluation, the patient was noted to be fluid overloaded with anasarca.  She was afebrile hemodynamically stable saturating 92-95% room air.  WBC was 7.7.  She was noted to have serum creatinine 2.57.  She was started on intravenous furosemide.  Although the exact history is not completely clear, it is also been noted in the medical record that the patient has had worsening lower extremity edema and abdominal wall edema at least for the past week.  The patient had a recent mission to the hospital from 01/03/2019 through 01/08/2019 for increasing generalized weakness and frequent falls.  She was diagnosed with a UTI.  She was sent to a skilled nursing facility at that time.  Since that discharge, the patient has been transition from furosemide to torsemide, but it is unclear exactly when this transition occurred.  Assessment/Plan: Acute diastolic CHF/anasarca -Continue IV furosemide -Daily weights -Accurate I's and O's -01/04/2019 echo EF 45- 50%, no WMA, elevated RV pressure 58.0.  Dilated IVC; moderate TR, mild decreased RV function -Personally view chest x-ray--increased interstitial markings, left pleural effusion -suspect degree of Cor Pulmonale, possibly due to undiagnosed OSA -pt remains  fluid overloaded with JVD  Acute on chronic renal failure--CKD stage IV -Baseline creatinine 1.6-1.8 -Presented with serum creatinine 2.57 -No hydronephrosis noted on CT abdomen and pelvis -Urinalysis bland  Chronic atrial fibrillation -CHADSVASc = 7 (CHF, HTN, Age, Female, DM, CAD) -Not on anticoagulation secondary to history of frequent falls -Continue metoprolol -Personally reviewed EKG--atrial fibrillation, no ST-T wave changes -continue ASA 81 mg daily  Acute metabolic encephalopathy -no fever noted since admission -B12 -TSH -Free T4 -ammonia -unclear baseline mentation -A&Ox2 during my exam -likely due to CHF and Acute on chronic renal failure  Diabetes mellitus type 2, uncontrolled with hyperglycemia -Holding glipizide -Start reduced dose Lantus -NovoLog sliding scale -Hemoglobin A1c -Holding Januvia  Essential hypertension -Restart metoprolol tartrate  Hyperlipidemia -Restart Crestor  Fibromyalgia -Restart Lyrica and Paxil  Recent right humerus fracture -Follow-up orthopedics, Dr. Aline Brochure    Disposition Plan:   SNF 2-3 days  Family Communication:   Left VM for daughter to call back  Consultants:  none  Code Status:  DNR  DVT Prophylaxis:  SCDs   Procedures: As Listed in Progress Note Above  Antibiotics: None       Subjective: Patient denies fevers, chills, headache, chest pain, dyspnea, nausea, vomiting, diarrhea, abdominal pain, dysuria, hematuria, hematochezia, and melena.   Objective: Vitals:   02/12/19 2009 02/12/19 2020 02/13/19 0545 02/13/19 0630  BP:  131/85 135/83   Pulse:  (!) 120 (!) 112   Resp:  18 18   Temp:  98.8 F (37.1 C) 98.1 F (36.7 C)   TempSrc:  Oral Oral   SpO2: 94% 95% 91%   Weight:    123.3 kg  Height:  Intake/Output Summary (Last 24 hours) at 02/13/2019 0817 Last data filed at 02/13/2019 0500 Gross per 24 hour  Intake -  Output 1200 ml  Net -1200 ml   Weight change:  Exam:   General:   Pt is alert, follows commands appropriately, not in acute distress  HEENT: No icterus, No thrush, No neck mass, [place]/AT  Cardiovascular: RRR, S1/S2, no rubs, no gallops  Respiratory: bilateral crackles, no wheeze.  Decreased breath sounds R base  Abdomen: Soft/+BS, non tender, non distended, no guarding  Extremities: 3 + edema, No lymphangitis, No petechiae, No rashes, no synovitis   Data Reviewed: I have personally reviewed following labs and imaging studies Basic Metabolic Panel: Recent Labs  Lab 02/12/19 1539 02/13/19 0417  NA 144 145  K 4.2 4.9  CL 107 106  CO2 30 27  GLUCOSE 224* 172*  BUN 60* 59*  CREATININE 2.57* 2.56*  CALCIUM 8.9 9.1   Liver Function Tests: Recent Labs  Lab 02/12/19 1539  AST 21  ALT 12  ALKPHOS 29*  BILITOT 0.3  PROT 5.8*  ALBUMIN 2.8*   No results for input(s): LIPASE, AMYLASE in the last 168 hours. No results for input(s): AMMONIA in the last 168 hours. Coagulation Profile: No results for input(s): INR, PROTIME in the last 168 hours. CBC: Recent Labs  Lab 02/12/19 1539  WBC 7.7  NEUTROABS 5.5  HGB 9.1*  HCT 32.3*  MCV 84.6  PLT 154   Cardiac Enzymes: Recent Labs  Lab 02/12/19 1539  TROPONINI 0.03*   BNP: Invalid input(s): POCBNP CBG: Recent Labs  Lab 02/12/19 2145 02/13/19 0720  GLUCAP 169* 156*   HbA1C: No results for input(s): HGBA1C in the last 72 hours. Urine analysis:    Component Value Date/Time   COLORURINE YELLOW 02/12/2019 [place] 02/12/2019 1734   LABSPEC 1.011 02/12/2019 1734   PHURINE 5.0 02/12/2019 1734   GLUCOSEU NEGATIVE 02/12/2019 1734   GLUCOSEU neg 04/07/2010   HGBUR NEGATIVE 02/12/2019 1734   BILIRUBINUR NEGATIVE 02/12/2019 1734   KETONESUR NEGATIVE 02/12/2019 1734   PROTEINUR NEGATIVE 02/12/2019 1734   UROBILINOGEN 0.2 09/27/2014 1630   NITRITE NEGATIVE 02/12/2019 1734   LEUKOCYTESUR NEGATIVE 02/12/2019 1734   Sepsis Labs:  $Remo'@[email or web address]'[id number]$ (procalcitonin:4,lacticidven:4) ) Recent Results (from the past 240 hour(s))  MRSA PCR Screening     Status: Abnormal   Collection Time: 02/12/19  8:40 PM  Result Value Ref Range Status   MRSA by PCR POSITIVE (A) NEGATIVE Final    Comment:        The GeneXpert MRSA Assay (FDA approved for NASAL specimens only), is one component of a comprehensive MRSA colonization surveillance program. It is not intended to diagnose MRSA infection nor to guide or monitor treatment for MRSA infections. RESULT CALLED TO, READ BACK BY AND VERIFIED WITH: TATE,R ON 02/12/19 AT 2300 BY LOY,C Performed at [facility], [address], [place], [place] [address]      Scheduled Meds: . aspirin EC  81 mg Oral Daily  . cephALEXin  500 mg Oral Q8H  . Chlorhexidine Gluconate Cloth  6 each Topical Q0600  . furosemide  40 mg Intravenous Q12H  . insulin aspart  0-9 Units Subcutaneous TID WC  . metoprolol tartrate  37.5 mg Oral BID  . mupirocin ointment  1 application Nasal BID  . potassium chloride SA  20 mEq Oral Daily  . sodium chloride flush  3 mL Intravenous Q12H   Continuous Infusions: . sodium chloride  Procedures/Studies: Ct Abdomen Pelvis Wo Contrast  Result Date: 02/12/2019 CLINICAL DATA:  85 year old female with history of abdominal distension, fever and altered mental status. Increasing falls. EXAM: CT ABDOMEN AND PELVIS WITHOUT CONTRAST TECHNIQUE: Multidetector CT imaging of the abdomen and pelvis was performed following the standard protocol without IV contrast. COMPARISON:  CT the abdomen and pelvis 11/10/2016. FINDINGS: Lower chest: Cardiomegaly. Atherosclerotic calcifications in the descending thoracic aorta as well as the left anterior descending, left circumflex and right coronary arteries. Calcifications of the aortic valve and mitral annulus. Moderate left pleural effusion. Areas of passive atelectasis are noted in the left lung base. Edema throughout the lower left chest  wall and visualized portions of the left breast. Hepatobiliary: No definite suspicious cystic or solid hepatic lesions are confidently identified on today's noncontrast CT examination. Status post cholecystectomy. Pancreas: Pancreatic atrophy. No definite pancreatic mass or peripancreatic fluid or inflammatory changes. Spleen: Unremarkable. Adrenals/Urinary Tract: 2.3 cm low-attenuation lesion in the medial aspect of the upper pole of the left kidney, incompletely characterized on today's noncontrast CT examination, but statistically likely a cyst. Unenhanced appearance of the right kidney is normal. No hydroureteronephrosis. Urinary bladder is unremarkable. Stomach/Bowel: Normal appearance of the stomach. No pathologic dilatation of small bowel or colon. Numerous colonic diverticulae are noted. Due to surrounding ascites, accurate assessment for focal inflammation indicative of acute diverticulitis is not possible on today's examination, but no focal inflammatory changes are confidently identified given these limitations. Appendix is not confidently identified may be surgically absent. Vascular/Lymphatic: Aortic atherosclerosis. No lymphadenopathy noted in the abdomen or pelvis. Reproductive: Status post hysterectomy. Ovaries are not confidently identified may be surgically absent or atrophic. Other: Small volume of ascites. No pneumoperitoneum. Diffuse body wall edema. Musculoskeletal: Status post PLIF from L3-L5 with interbody grafts at L3-L4 and L4-L5. There are no aggressive appearing lytic or blastic lesions noted in the visualized portions of the skeleton. IMPRESSION: 1. Small volume of ascites, left pleural effusion and diffuse body wall edema; imaging findings suggestive of a state of anasarca. 2. No other acute findings are confidently identified in the abdomen or pelvis to account for the patient's symptoms. 3. Cardiomegaly. 4. Aortic atherosclerosis, in addition to least 3 vessel coronary artery disease.  5. There are calcifications of the aortic valve and mitral annulus. Echocardiographic correlation for evaluation of potential valvular dysfunction may be warranted if clinically indicated. 6. Colonic diverticulosis. Electronically Signed   By: Vinnie Langton M.D.   On: 02/12/2019 16:50   Dg Chest 1 View  Result Date: 02/12/2019 CLINICAL DATA:  Acute presentation with weakness. EXAM: CHEST  1 VIEW COMPARISON:  01/03/2019 FINDINGS: Artifact overlies the chest. Left Port-A-Cath tip in the SVC above the right atrium. Right lung remains clear. There is worsened infiltrate and volume loss in the left lower lobe. There is probably a left effusion as well. IMPRESSION: Worsening of disease on the left. Probable left lower lobe pneumonia and collapse. Suspected left effusion. Electronically Signed   By: Nelson Chimes M.D.   On: 02/12/2019 16:33   Dg Shoulder Right  Result Date: 02/11/2019 CLINICAL DATA:  Right shoulder pain after a fall tonight EXAM: RIGHT SHOULDER - 2+ VIEW COMPARISON:  None. FINDINGS: Mildly comminuted fracture of the humeral shaft with mild angulation. A sharp fracture fragment is present anteriorly and medially. Osteopenic appearance. Located glenohumeral and acromioclavicular joints. IMPRESSION: 1. Humeral shaft fracture as described on dedicated exam. 2. Located shoulder. Electronically Signed   By: Monte Fantasia M.D.   On: 02/11/2019  05:02   Dg Humerus Right  Result Date: 02/11/2019 CLINICAL DATA:  Fall with right arm pain EXAM: RIGHT HUMERUS - 2+ VIEW COMPARISON:  None. FINDINGS: Mildly comminuted fracture of the mid humeral shaft with apex ventral and medial angulation. A sharp bone fragment is present medially. Located glenohumeral and acromioclavicular joints. IMPRESSION: Comminuted fracture of the humeral shaft with fracture likely extending to the surgical neck. A sharp bone fragment is present medially. Electronically Signed   By: Monte Fantasia M.D.   On: 02/11/2019 05:03     Orson Eva, DO  [facility] Pager [phone]  If 7PM-7AM, please contact night-coverage [email or web address] Password [facility] 02/13/2019, 8:17 AM   LOS: 1 day

## 2019-02-14 DIAGNOSIS — N189 Chronic kidney disease, unspecified: Secondary | ICD-10-CM

## 2019-02-14 LAB — BASIC METABOLIC PANEL
Anion gap: 8 (ref 5–15)
BUN: 58 mg/dL — ABNORMAL HIGH (ref 8–23)
CO2: 31 mmol/L (ref 22–32)
Calcium: 9 mg/dL (ref 8.9–10.3)
Chloride: 101 mmol/L (ref 98–111)
Creatinine, Ser: 2.41 mg/dL — ABNORMAL HIGH (ref 0.44–1.00)
GFR calc Af Amer: 21 mL/min — ABNORMAL LOW (ref >60)
GFR calc non Af Amer: 18 mL/min — ABNORMAL LOW (ref >60)
Glucose, Bld: 202 mg/dL — ABNORMAL HIGH (ref 70–99)
POTASSIUM: 4.2 mmol/L (ref 3.5–5.1)
Sodium: 140 mmol/L (ref 135–145)

## 2019-02-14 LAB — GLUCOSE, CAPILLARY
Glucose-Capillary: 177 mg/dL — ABNORMAL HIGH (ref 70–99)
Glucose-Capillary: 257 mg/dL — ABNORMAL HIGH (ref 70–99)
Glucose-Capillary: 226 mg/dL — ABNORMAL HIGH (ref 70–99)
Glucose-Capillary: 238 mg/dL — ABNORMAL HIGH (ref 70–99)

## 2019-02-14 NOTE — Progress Notes (Signed)
PROGRESS NOTE  [name] VCB:[id number] DOB: 25-Jun-1933 DOA: 02/12/2019 PCP: Redmond School, MD  HPI/Recap of past 58 hours: 85 year old female with history of chronic kidney disease stage V for atrial fibrillation stroke diabetes mellitus hypertension breast cancer on the right in remission admitted with a fall at the nursing home to generalized weakness which is progressive Subjective patient seen at bedside she is lying down in bed complaining of pain on the right arm.  Assessment/Plan: Principal Problem:   Acute CHF (congestive heart failure) (HCC) Active Problems:   Atrial fibrillation (HCC)   Essential hypertension   Aortic stenosis   Fibromyalgia   Falls infrequently   Acute kidney injury superimposed on chronic kidney disease (HCC)   Acute lower UTI (urinary tract infection)   Acute renal failure superimposed on stage 4 chronic kidney disease (HCC)   Acute diastolic CHF (congestive heart failure) ([place])  1.  Acute on chronic congestive heart failure with anasarca.  She will continue IV diuresis daily weights,  2.  Acute on chronic renal failure with chronic kidney disease stage IV creatinine is at baseline her initial creatinine on presentation was 2.5.  3.  Chronic atrial fibrillation stable continue aspirin 81 mg daily and metoprolol.  Patient is not on anticoagulation due to frequent falls.  4.  Type 2 diabetes mellitus uncontrolled.  Her glipizide is being held as well as Januvia.  She is on sliding scale   5.  Essential hypertension continue metoprolol titrate  6.  Hyperlipidemia continue Crestor  7.  Fibromyalgia continue Lyrica and Paxil  8.  Recent right humeral fracture, patient will follow-up with orthopedic Dr. Aline Brochure  Code Status: DNR  Severity of Illness: The appropriate patient status for this patient is INPATIENT. Inpatient status is judged to be reasonable and necessary in order to provide the required intensity of service to ensure the  patient's safety. The patient's presenting symptoms, physical exam findings, and initial radiographic and laboratory data in the context of their chronic comorbidities is felt to place them at high risk for further clinical deterioration. Furthermore, it is not anticipated that the patient will be medically stable for discharge from the hospital within 2 midnights of admission. The following factors support the patient status of inpatient.   " Patient continues to require IV diuretic  * I certify that at the point of admission it is my clinical judgment that the patient will require inpatient hospital care spanning beyond 2 midnights from the point of admission due to high intensity of service, high risk for further deterioration and high frequency of surveillance required.*    Family Communication: None at bedside  Disposition Plan: To nursing home when stable   Consultants:  None  Procedures:  None  Antimicrobials:  None  DVT prophylaxis: SCD   Objective: Vitals:   02/13/19 0630 02/13/19 2130 02/14/19 0600 02/14/19 1438  BP:  122/70 111/80 100/80  Pulse:  (!) 111 (!) 103 (!) 101  Resp:  $Remo'18 16 18  'XfEXN$ Temp:  98.4 F (36.9 C) 98 F (36.7 C) 97.6 F (36.4 C)  TempSrc:  Oral  Oral  SpO2:  99% 93% 95%  Weight: 123.3 kg  121 kg   Height:        Intake/Output Summary (Last 24 hours) at 02/14/2019 2013 Last data filed at 02/14/2019 1700 Gross per 24 hour  Intake 720 ml  Output 2000 ml  Net -1280 ml   Filed Weights   02/12/19 1446 02/13/19 0630 02/14/19 0600  Weight:  111.5 kg 123.3 kg 121 kg   Body mass index is 37.21 kg/m.  Exam:  . General: 85 y.o. year-old female well developed well nourished in no acute distress.  Alert and oriented x3. . Cardiovascular: Regular rate and rhythm with no rubs or gallops.  No thyromegaly or JVD noted.   Marland Kitchen Respiratory: Clear to auscultation with no wheezes or rales. Good inspiratory effort. . Abdomen: Soft nontender nondistended with  normal bowel sounds x4 quadrants. . Musculoskeletal: Tenderness of the right arm which is an Ace wrap and a sling.  No lower extremity edema. 2/4 pulses in all 4 extremities. . Skin: No ulcerative lesions noted or rashes, . Psychiatry: Mood is appropriate for condition and setting    Data Reviewed: CBC: Recent Labs  Lab 02/12/19 1539  WBC 7.7  NEUTROABS 5.5  HGB 9.1*  HCT 32.3*  MCV 84.6  PLT 706   Basic Metabolic Panel: Recent Labs  Lab 02/12/19 1539 02/13/19 0417 02/14/19 0607  NA 144 145 140  K 4.2 4.9 4.2  CL 107 106 101  CO2 $Re'30 27 31  'Cee$ GLUCOSE 224* 172* 202*  BUN 60* 59* 58*  CREATININE 2.57* 2.56* 2.41*  CALCIUM 8.9 9.1 9.0   GFR: Estimated Creatinine Clearance: 25.4 mL/min (A) (by C-G formula based on SCr of 2.41 mg/dL (H)). Liver Function Tests: Recent Labs  Lab 02/12/19 1539  AST 21  ALT 12  ALKPHOS 29*  BILITOT 0.3  PROT 5.8*  ALBUMIN 2.8*   No results for input(s): LIPASE, AMYLASE in the last 168 hours. Recent Labs  Lab 02/13/19 0934  AMMONIA 14   Coagulation Profile: No results for input(s): INR, PROTIME in the last 168 hours. Cardiac Enzymes: Recent Labs  Lab 02/12/19 1539 02/13/19 0934  CKTOTAL  --  35*  TROPONINI 0.03*  --    BNP (last 3 results) No results for input(s): PROBNP in the last 8760 hours. HbA1C: Recent Labs    02/13/19 0933  HGBA1C 8.9*   CBG: Recent Labs  Lab 02/13/19 1604 02/13/19 2131 02/14/19 0729 02/14/19 1104 02/14/19 1625  GLUCAP 263* 257* 177* 257* 226*   Lipid Profile: No results for input(s): CHOL, HDL, LDLCALC, TRIG, CHOLHDL, LDLDIRECT in the last 72 hours. Thyroid Function Tests: Recent Labs    02/13/19 0933 02/13/19 0934  TSH  --  3.249  FREET4 0.94  --    Anemia Panel: Recent Labs    02/13/19 0933  VITAMINB12 295  FOLATE 10.5   Urine analysis:    Component Value Date/Time   COLORURINE YELLOW 02/12/2019 [place] 02/12/2019 1734   LABSPEC 1.011 02/12/2019 1734    PHURINE 5.0 02/12/2019 1734   GLUCOSEU NEGATIVE 02/12/2019 1734   GLUCOSEU neg 04/07/2010   HGBUR NEGATIVE 02/12/2019 1734   BILIRUBINUR NEGATIVE 02/12/2019 1734   KETONESUR NEGATIVE 02/12/2019 1734   PROTEINUR NEGATIVE 02/12/2019 1734   UROBILINOGEN 0.2 09/27/2014 1630   NITRITE NEGATIVE 02/12/2019 1734   LEUKOCYTESUR NEGATIVE 02/12/2019 1734   Sepsis Labs: $RemoveBefo'@[email or web address]'[id number]$ (procalcitonin:4,lacticidven:4)  ) Recent Results (from the past 240 hour(s))  MRSA PCR Screening     Status: Abnormal   Collection Time: 02/12/19  8:40 PM  Result Value Ref Range Status   MRSA by PCR POSITIVE (A) NEGATIVE Final    Comment:        The GeneXpert MRSA Assay (FDA approved for NASAL specimens only), is one component of a comprehensive MRSA colonization surveillance program. It is not intended to diagnose MRSA infection nor to  guide or monitor treatment for MRSA infections. RESULT CALLED TO, READ BACK BY AND VERIFIED WITH: TATE,R ON 02/12/19 AT 2300 BY LOY,C Performed at [facility], [address], [place], [place] [address]   Respiratory Panel by PCR     Status: None   Collection Time: 02/13/19  9:11 AM  Result Value Ref Range Status   Adenovirus NOT DETECTED NOT DETECTED Final   Coronavirus 229E NOT DETECTED NOT DETECTED Final    Comment: (NOTE) The Coronavirus on the Respiratory Panel, DOES NOT test for the novel  Coronavirus (2019 nCoV)    Coronavirus HKU1 NOT DETECTED NOT DETECTED Final   Coronavirus NL63 NOT DETECTED NOT DETECTED Final   Coronavirus OC43 NOT DETECTED NOT DETECTED Final   Metapneumovirus NOT DETECTED NOT DETECTED Final   Rhinovirus / Enterovirus NOT DETECTED NOT DETECTED Final   Influenza A NOT DETECTED NOT DETECTED Final   Influenza B NOT DETECTED NOT DETECTED Final   Parainfluenza Virus 1 NOT DETECTED NOT DETECTED Final   Parainfluenza Virus 2 NOT DETECTED NOT DETECTED Final   Parainfluenza Virus 3 NOT DETECTED NOT DETECTED Final   Parainfluenza Virus 4 NOT  DETECTED NOT DETECTED Final   Respiratory Syncytial Virus NOT DETECTED NOT DETECTED Final   Bordetella pertussis NOT DETECTED NOT DETECTED Final   Chlamydophila pneumoniae NOT DETECTED NOT DETECTED Final   Mycoplasma pneumoniae NOT DETECTED NOT DETECTED Final    Comment: Performed at [facility], [address] [place], [place] [address]      Studies: No results found.  Scheduled Meds: . aspirin EC  81 mg Oral Daily  . cephALEXin  500 mg Oral Q8H  . Chlorhexidine Gluconate Cloth  6 each Topical Q0600  . furosemide  40 mg Intravenous Q12H  . insulin aspart  0-5 Units Subcutaneous QHS  . insulin aspart  0-9 Units Subcutaneous TID WC  . insulin glargine  6 Units Subcutaneous Daily  . metoprolol tartrate  37.5 mg Oral BID  . mupirocin ointment  1 application Nasal BID  . PARoxetine  40 mg Oral Daily  . potassium chloride SA  20 mEq Oral Daily  . pregabalin  50 mg Oral Daily  . rosuvastatin  10 mg Oral q1800  . sodium chloride flush  3 mL Intravenous Q12H    Continuous Infusions: . sodium chloride       LOS: 2 days     Cristal Deer, MD [facility]  To reach me or the doctor on call, go to: [email or web address] Password [facility]  02/14/2019, 8:13 PM

## 2019-02-15 DIAGNOSIS — I480 Paroxysmal atrial fibrillation: Secondary | ICD-10-CM

## 2019-02-15 DIAGNOSIS — N390 Urinary tract infection, site not specified: Secondary | ICD-10-CM

## 2019-02-15 DIAGNOSIS — Z9181 History of falling: Secondary | ICD-10-CM

## 2019-02-15 DIAGNOSIS — I5041 Acute combined systolic (congestive) and diastolic (congestive) heart failure: Secondary | ICD-10-CM

## 2019-02-15 LAB — BASIC METABOLIC PANEL
Anion gap: 9 (ref 5–15)
BUN: 60 mg/dL — AB (ref 8–23)
CO2: 31 mmol/L (ref 22–32)
Calcium: 8.9 mg/dL (ref 8.9–10.3)
Chloride: 99 mmol/L (ref 98–111)
Creatinine, Ser: 2.34 mg/dL — ABNORMAL HIGH (ref 0.44–1.00)
GFR calc Af Amer: 22 mL/min — ABNORMAL LOW (ref >60)
GFR calc non Af Amer: 19 mL/min — ABNORMAL LOW (ref >60)
Glucose, Bld: 214 mg/dL — ABNORMAL HIGH (ref 70–99)
Potassium: 4.3 mmol/L (ref 3.5–5.1)
Sodium: 139 mmol/L (ref 135–145)

## 2019-02-15 LAB — GLUCOSE, CAPILLARY
Glucose-Capillary: 201 mg/dL — ABNORMAL HIGH (ref 70–99)
Glucose-Capillary: 208 mg/dL — ABNORMAL HIGH (ref 70–99)
Glucose-Capillary: 187 mg/dL — ABNORMAL HIGH (ref 70–99)
Glucose-Capillary: 192 mg/dL — ABNORMAL HIGH (ref 70–99)

## 2019-02-15 NOTE — Progress Notes (Addendum)
PROGRESS NOTE  [name] ULA:[id number] DOB: 1932-12-29 DOA: 02/12/2019 PCP: Redmond School, MD  HPI/Recap of past 38 hours: 85 year old female with history of chronic kidney disease stage V for atrial fibrillation stroke diabetes mellitus hypertension breast cancer on the right in remission admitted with a fall at the nursing home to generalized weakness which is progressive Subjective patient seen at bedside she is lying down in bed complaining of pain on the right arm.    February 15, 2019: Subjective:   Patient seen and examined at bedside she is complaining of not feeling good at all today she is in a lot of pain from her right arm fracture.  Denies any fever chills nausea vomiting  Assessment/Plan: Principal Problem:   Acute CHF (congestive heart failure) (HCC) Active Problems:   Atrial fibrillation (HCC)   Essential hypertension   Aortic stenosis   Fibromyalgia   Falls infrequently   Acute kidney injury superimposed on chronic kidney disease (HCC)   Acute lower UTI (urinary tract infection)   Acute renal failure superimposed on stage 4 chronic kidney disease (HCC)   Acute diastolic CHF (congestive heart failure) ([place])  1.  Acute on chronic congestive heart failure with anasarca.  She will continue IV diuresis daily weights,  2.  Acute on chronic renal failure with chronic kidney disease stage IV creatinine is at baseline her initial creatinine on presentation was 2.5.  Nephrology is following  3.  Chronic atrial fibrillation stable continue aspirin 81 mg daily and metoprolol.  Patient is not on anticoagulation due to frequent falls.  4.  Type 2 diabetes mellitus uncontrolled.  Her glipizide is being held as well as Januvia.  She is on sliding scale   5.  Essential hypertension continue metoprolol titrate  6.  Hyperlipidemia continue Crestor  7.  Fibromyalgia continue Lyrica and Paxil  8.  Recent right humeral fracture, patient will follow-up with orthopedic Dr.  Aline Brochure as outpatient.  We will continue pain management with hydrocodone  Code Status: DNR  Severity of Illness: The appropriate patient status for this patient is INPATIENT. Inpatient status is judged to be reasonable and necessary in order to provide the required intensity of service to ensure the patient's safety. The patient's presenting symptoms, physical exam findings, and initial radiographic and laboratory data in the context of their chronic comorbidities is felt to place them at high risk for further clinical deterioration. Furthermore, it is not anticipated that the patient will be medically stable for discharge from the hospital within 2 midnights of admission. The following factors support the patient status of inpatient.   " Patient continues to require IV diuretic  * I certify that at the point of admission it is my clinical judgment that the patient will require inpatient hospital care spanning beyond 2 midnights from the point of admission due to high intensity of service, high risk for further deterioration and high frequency of surveillance required.*    Family Communication: None at bedside  Disposition Plan: To nursing home when stable   Consultants:  None  Procedures:  None  Antimicrobials:  None  DVT prophylaxis: SCD   Objective: Vitals:   02/14/19 1438 02/14/19 2125 02/15/19 0518 02/15/19 1324  BP: 100/80  134/79 135/82  Pulse: (!) 101 (!) 116 (!) 117 87  Resp: $Remo'18 18 18 18  'LMxTX$ Temp: 97.6 F (36.4 C) 98.4 F (36.9 C) 98.7 F (37.1 C) 98.2 F (36.8 C)  TempSrc: Oral Oral Oral Oral  SpO2: 95% 95% 90% 90%  Weight:      Height:        Intake/Output Summary (Last 24 hours) at 02/15/2019 1723 Last data filed at 02/15/2019 1321 Gross per 24 hour  Intake 480 ml  Output 1700 ml  Net -1220 ml   Filed Weights   02/12/19 1446 02/13/19 0630 02/14/19 0600  Weight: 111.5 kg 123.3 kg 121 kg   Body mass index is 37.21 kg/m.  Exam:  . General: 85  y.o. year-old female well developed well nourished in no acute distress.  Alert and oriented x3. . Cardiovascular: Regular rate and rhythm with no rubs or gallops.  No thyromegaly or JVD noted.   Marland Kitchen Respiratory: Clear to auscultation with no wheezes or rales. Good inspiratory effort. . Abdomen: Soft nontender nondistended with normal bowel sounds x4 quadrants. . Musculoskeletal: Tenderness of the right arm which is an Ace wrap and a sling.  No lower extremity edema. 2/4 pulses in all 4 extremities. . Skin: No ulcerative lesions noted or rashes, . Psychiatry: Mood is appropriate for condition and setting    Data Reviewed: CBC: Recent Labs  Lab 02/12/19 1539  WBC 7.7  NEUTROABS 5.5  HGB 9.1*  HCT 32.3*  MCV 84.6  PLT 299   Basic Metabolic Panel: Recent Labs  Lab 02/12/19 1539 02/13/19 0417 02/14/19 0607 02/15/19 0606  NA 144 145 140 139  K 4.2 4.9 4.2 4.3  CL 107 106 101 99  CO2 $Re'30 27 31 31  'Bmr$ GLUCOSE 224* 172* 202* 214*  BUN 60* 59* 58* 60*  CREATININE 2.57* 2.56* 2.41* 2.34*  CALCIUM 8.9 9.1 9.0 8.9   GFR: Estimated Creatinine Clearance: 26.1 mL/min (A) (by C-G formula based on SCr of 2.34 mg/dL (H)). Liver Function Tests: Recent Labs  Lab 02/12/19 1539  AST 21  ALT 12  ALKPHOS 29*  BILITOT 0.3  PROT 5.8*  ALBUMIN 2.8*   No results for input(s): LIPASE, AMYLASE in the last 168 hours. Recent Labs  Lab 02/13/19 0934  AMMONIA 14   Coagulation Profile: No results for input(s): INR, PROTIME in the last 168 hours. Cardiac Enzymes: Recent Labs  Lab 02/12/19 1539 02/13/19 0934  CKTOTAL  --  35*  TROPONINI 0.03*  --    BNP (last 3 results) No results for input(s): PROBNP in the last 8760 hours. HbA1C: Recent Labs    02/13/19 0933  HGBA1C 8.9*   CBG: Recent Labs  Lab 02/14/19 1625 02/14/19 2139 02/15/19 0716 02/15/19 1112 02/15/19 1611  GLUCAP 226* 238* 201* 208* 187*   Lipid Profile: No results for input(s): CHOL, HDL, LDLCALC, TRIG, CHOLHDL,  LDLDIRECT in the last 72 hours. Thyroid Function Tests: Recent Labs    02/13/19 0933 02/13/19 0934  TSH  --  3.249  FREET4 0.94  --    Anemia Panel: Recent Labs    02/13/19 0933  VITAMINB12 295  FOLATE 10.5   Urine analysis:    Component Value Date/Time   COLORURINE YELLOW 02/12/2019 [place] 02/12/2019 1734   LABSPEC 1.011 02/12/2019 1734   PHURINE 5.0 02/12/2019 1734   GLUCOSEU NEGATIVE 02/12/2019 1734   GLUCOSEU neg 04/07/2010   HGBUR NEGATIVE 02/12/2019 1734   BILIRUBINUR NEGATIVE 02/12/2019 1734   KETONESUR NEGATIVE 02/12/2019 1734   PROTEINUR NEGATIVE 02/12/2019 1734   UROBILINOGEN 0.2 09/27/2014 1630   NITRITE NEGATIVE 02/12/2019 1734   LEUKOCYTESUR NEGATIVE 02/12/2019 1734   Sepsis Labs: $RemoveBefo'@LABRCNTIP'ruVnByXYsrY$ (procalcitonin:4,lacticidven:4)  ) Recent Results (from the past 240 hour(s))  MRSA PCR Screening  Status: Abnormal   Collection Time: 02/12/19  8:40 PM  Result Value Ref Range Status   MRSA by PCR POSITIVE (A) NEGATIVE Final    Comment:        The GeneXpert MRSA Assay (FDA approved for NASAL specimens only), is one component of a comprehensive MRSA colonization surveillance program. It is not intended to diagnose MRSA infection nor to guide or monitor treatment for MRSA infections. RESULT CALLED TO, READ BACK BY AND VERIFIED WITH: TATE,R ON 02/12/19 AT 2300 BY LOY,C Performed at [facility], [address], [place], [place] [address]   Respiratory Panel by PCR     Status: None   Collection Time: 02/13/19  9:11 AM  Result Value Ref Range Status   Adenovirus NOT DETECTED NOT DETECTED Final   Coronavirus 229E NOT DETECTED NOT DETECTED Final    Comment: (NOTE) The Coronavirus on the Respiratory Panel, DOES NOT test for the novel  Coronavirus (2019 nCoV)    Coronavirus HKU1 NOT DETECTED NOT DETECTED Final   Coronavirus NL63 NOT DETECTED NOT DETECTED Final   Coronavirus OC43 NOT DETECTED NOT DETECTED Final   Metapneumovirus NOT  DETECTED NOT DETECTED Final   Rhinovirus / Enterovirus NOT DETECTED NOT DETECTED Final   Influenza A NOT DETECTED NOT DETECTED Final   Influenza B NOT DETECTED NOT DETECTED Final   Parainfluenza Virus 1 NOT DETECTED NOT DETECTED Final   Parainfluenza Virus 2 NOT DETECTED NOT DETECTED Final   Parainfluenza Virus 3 NOT DETECTED NOT DETECTED Final   Parainfluenza Virus 4 NOT DETECTED NOT DETECTED Final   Respiratory Syncytial Virus NOT DETECTED NOT DETECTED Final   Bordetella pertussis NOT DETECTED NOT DETECTED Final   Chlamydophila pneumoniae NOT DETECTED NOT DETECTED Final   Mycoplasma pneumoniae NOT DETECTED NOT DETECTED Final    Comment: Performed at [facility], [address], [place], [place] [address]      Studies: No results found.  Scheduled Meds: . aspirin EC  81 mg Oral Daily  . cephALEXin  500 mg Oral Q8H  . Chlorhexidine Gluconate Cloth  6 each Topical Q0600  . furosemide  40 mg Intravenous Q12H  . insulin aspart  0-5 Units Subcutaneous QHS  . insulin aspart  0-9 Units Subcutaneous TID WC  . insulin glargine  6 Units Subcutaneous Daily  . metoprolol tartrate  37.5 mg Oral BID  . mupirocin ointment  1 application Nasal BID  . PARoxetine  40 mg Oral Daily  . potassium chloride SA  20 mEq Oral Daily  . pregabalin  50 mg Oral Daily  . rosuvastatin  10 mg Oral q1800  . sodium chloride flush  3 mL Intravenous Q12H    Continuous Infusions: . sodium chloride       LOS: 3 days     Cristal Deer, MD [facility]  To reach me or the doctor on call, go to: [email or web address] Password [facility]  02/15/2019, 5:23 PM

## 2019-02-15 NOTE — Consult Note (Signed)
[facility]  INPATIENT CONSULTATION  Reason for Consultation: AKI on CKD and volume overload Requesting Provider: Dr. Kyung Bacca  HPI: [name] is an 85 y.o. female with h/o CKD, A flutter, DM, HL, HTN, DJD, h/o CVA, HFpEF (01/2019 TTE EF 45-50%, cannot est diastolic fcn with A fib, dilated RA, LA, RV), h/o breast cancer in remission who is seen for evaluation and management of AKI on CKD, volume overload.    She presented to [facility] 3/[facility] from [facility]F with AMS, increasing LE edema, abd distention, falls.  Her furosemide was recently changed to torsemide 40 daily due to worsening renal function and the edema began then (she isn't clear on exact details).  She has been treated with lasix 40 IV BID here. Cumulative I/Os for admission show net neg 4.6L.  UOP yesterday was 1.9L.  BPs during admission 100-130s, no hypotension. No contrast or NSAID exposure.  She had CT A/P which showed anasarca, a 2.3 low density lesion on L kidney but no other structural or obstructive pathology.  No acute intraabdominal process.   Her CKD is longstanding with Cr in the 1.6s in 2008, multiple AKIs over the years.  It appears in late 2019 her Cr was generally in the 1.8-1.9 range.  On presentation 3/12 Cr 2.57 > 3/14 2.4 > 3/15 2.34.   She follows with Dr. Justin Mend at [facility].    She has no particular complaints today except pain in the R arm from a recent fracture.  PMH: Past Medical History:  Diagnosis Date  . Anemia   . Aortic stenosis    Mild  . Arthritis   . Asthma   . Atrial flutter ([place]) 2002  . B12 deficiency 06/20/2015  . B12 deficiency 06/20/2015  . Breast carcinoma ([place])   . Cerebrovascular disease    with a 60% LICA; repeat study in 10/2009-no obstructive disease; modest ASVD  . Chronic kidney disease    Creatinine-1.5 in 2010 and 2.5-3 in 2011; 1.5-10/2010;  Klebsiella UTI-10/2010. urine protein 36 mg/dl, mildly elevated  . Decreased bone density   . Depression   . Diabetes mellitus   . Diabetic  retinopathy   . DJD (degenerative joint disease) 11/14/2011  . Falls infrequently 01/2010   fracture of pelvis and left humerus  . Fibromyalgia   . Headaches, cluster   . Hyperlipidemia   . Hypertension   . IBS (irritable bowel syndrome)    diverticulosis, gastroesophageal reflux disease  . Lower back pain   . Obesity 11/14/2011  . Spinal stenosis    PSH: Past Surgical History:  Procedure Laterality Date  . APPENDECTOMY    . CATARACT EXTRACTION, BILATERAL     Lens implants  . CENTRAL VENOUS CATHETER TUNNELED INSERTION SINGLE LUMEN    . CHOLECYSTECTOMY    . COLONOSCOPY  2012  . EYE SURGERY  nov 2012   for diabetic retinopathy  . LUMBAR DISC SURGERY     lumbosacral spine procedure x 2  . MASTECTOMY     Right breast for carcinoma    Past Medical History:  Diagnosis Date  . Anemia   . Aortic stenosis    Mild  . Arthritis   . Asthma   . Atrial flutter ([place]) 2002  . B12 deficiency 06/20/2015  . B12 deficiency 06/20/2015  . Breast carcinoma ([place])   . Cerebrovascular disease    with a 73% LICA; repeat study in 10/2009-no obstructive disease; modest ASVD  . Chronic kidney disease    Creatinine-1.5 in 2010  and 2.5-3 in 2011; 1.5-10/2010;  Klebsiella UTI-10/2010. urine protein 36 mg/dl, mildly elevated  . Decreased bone density   . Depression   . Diabetes mellitus   . Diabetic retinopathy   . DJD (degenerative joint disease) 11/14/2011  . Falls infrequently 01/2010   fracture of pelvis and left humerus  . Fibromyalgia   . Headaches, cluster   . Hyperlipidemia   . Hypertension   . IBS (irritable bowel syndrome)    diverticulosis, gastroesophageal reflux disease  . Lower back pain   . Obesity 11/14/2011  . Spinal stenosis     Medications:  I have reviewed the patient's current medications.  Medications Prior to Admission  Medication Sig Dispense Refill  . albuterol (PROAIR HFA) 108 (90 BASE) MCG/ACT inhaler Inhale 2 puffs into the lungs every 6 (six) hours as  needed for wheezing or shortness of breath. 1 Inhaler 3  . aspirin EC 81 MG tablet Take 81 mg by mouth daily.    . cephALEXin (KEFLEX) 500 MG capsule Take 500 mg by mouth every 8 (eight) hours. 7 day course starting on 02/10/2019 for cellulitis    . clonazePAM (KLONOPIN) 0.5 MG tablet Take 0.5 mg by mouth daily as needed for anxiety.     Marland Kitchen esomeprazole (NEXIUM) 40 MG capsule Take 40 mg by mouth daily.     . fenofibrate (TRICOR) 145 MG tablet Take 145 mg by mouth every evening.     . fluconazole (DIFLUCAN) 150 MG tablet Take 150 mg by mouth every 3 (three) days. Starting on 02/10/2019 for a total of 2 total doses to be completed on 02/13/2019    . glipiZIDE (GLUCOTROL) 10 MG tablet Take 10 mg by mouth 2 (two) times daily before a meal.     . HYDROcodone-acetaminophen (NORCO) 10-325 MG per tablet Take 1 tablet by mouth 2 (two) times daily as needed for moderate pain.     Marland Kitchen insulin aspart (NOVOLOG FLEXPEN) 100 UNIT/ML FlexPen Inject 8 Units into the skin 3 (three) times daily with meals. 15 mL 11  . Insulin Glargine (BASAGLAR KWIKPEN) 100 UNIT/ML SOPN Inject 12 Units into the skin at bedtime.    Marland Kitchen loperamide (IMODIUM) 2 MG capsule Take 2 mg by mouth daily as needed for diarrhea or loose stools.     . metoprolol tartrate 37.5 MG TABS Take 37.5 mg by mouth 2 (two) times daily.    Marland Kitchen nystatin (MYCOSTATIN/NYSTOP) powder Apply 1 g topically 3 (three) times daily. Apply to abdominal fold topically every day and evening shift for candidas    . PARoxetine (PAXIL) 40 MG tablet Take 40 mg by mouth daily.     . potassium chloride SA (K-DUR,KLOR-CON) 20 MEQ tablet Take 20 mEq by mouth daily.     . pregabalin (LYRICA) 50 MG capsule Take 50 mg by mouth 3 (three) times daily.     . raloxifene (EVISTA) 60 MG tablet Take 60 mg by mouth daily.    . rosuvastatin (CRESTOR) 40 MG tablet Take 1 tablet (40 mg total) by mouth at bedtime. 90 tablet 4  . sitaGLIPtin (JANUVIA) 25 MG tablet Take 25 mg by mouth daily.    Marland Kitchen  torsemide (DEMADEX) 20 MG tablet Take 40 mg by mouth daily.      ALLERGIES:   Allergies  Allergen Reactions  . Mold Extract [Trichophyton Mentagrophyte] Other (See Comments)    Reaction:  Itchy, watery eyes/sneezing   . Morphine And Related Other (See Comments)    Reaction:  Drowsiness. "I  didn't wake up for almost 1 week" after taking morphine.   . Niaspan [Niacin] Other (See Comments)    Reaction:  Flushing   . Pollen Extract Other (See Comments)    Reaction:  Itchy, watery eyes/sneezing     FAM HX: Family History  Problem Relation Age of Onset  . Alzheimer's disease Mother   . Heart attack Father   . Aneurysm Sister   . Coronary artery disease Brother     Social History:   reports that she quit smoking about 38 years ago. Her smoking use included cigarettes. She has a 30.00 pack-year smoking history. She has never used smokeless tobacco. She reports that she does not drink alcohol or use drugs.  ROS: 12 system ROS neg except for HPI above  Blood pressure 134/79, pulse (!) 117, temperature 98.7 F (37.1 C), temperature source Oral, resp. rate 18, height $RemoveBe'5\' 11"'umGKwBlpf$  (1.803 m), weight 121 kg, SpO2 90 %. PHYSICAL EXAM: Gen: frail woman who is eating a full lunch in bed  Eyes: anicteric ENT: MMM Neck: unable to assess JVD due to habitus CV:  Tachycardic, no rub or S4 Abd:  Soft,obese, nontender GU: purewick collecting clear yellow urine Extr:  2+pitting LE edema, RUE wrapped in brace and in sling Neuro: tremor noted, slow speech but generally oriented Skin: scattered ecchymoses, no rashes   Results for orders placed or performed during the hospital encounter of 02/12/19 (from the past 48 hour(s))  Glucose, capillary     Status: Abnormal   Collection Time: 02/13/19  4:04 PM  Result Value Ref Range   Glucose-Capillary 263 (H) 70 - 99 mg/dL  Glucose, capillary     Status: Abnormal   Collection Time: 02/13/19  9:31 PM  Result Value Ref Range   Glucose-Capillary 257 (H) 70 -  99 mg/dL   Comment 1 Notify RN    Comment 2 Document in Chart   Basic metabolic panel     Status: Abnormal   Collection Time: 02/14/19  6:07 AM  Result Value Ref Range   Sodium 140 135 - 145 mmol/L   Potassium 4.2 3.5 - 5.1 mmol/L   Chloride 101 98 - 111 mmol/L   CO2 31 22 - 32 mmol/L   Glucose, Bld 202 (H) 70 - 99 mg/dL   BUN 58 (H) 8 - 23 mg/dL   Creatinine, Ser 2.41 (H) 0.44 - 1.00 mg/dL   Calcium 9.0 8.9 - 10.3 mg/dL   GFR calc non Af Amer 18 (L) >60 mL/min   GFR calc Af Amer 21 (L) >60 mL/min   Anion gap 8 5 - 15    Comment: Performed at [facility], [address], [place], [place] [address]  Glucose, capillary     Status: Abnormal   Collection Time: 02/14/19  7:29 AM  Result Value Ref Range   Glucose-Capillary 177 (H) 70 - 99 mg/dL  Glucose, capillary     Status: Abnormal   Collection Time: 02/14/19 11:04 AM  Result Value Ref Range   Glucose-Capillary 257 (H) 70 - 99 mg/dL  Glucose, capillary     Status: Abnormal   Collection Time: 02/14/19  4:25 PM  Result Value Ref Range   Glucose-Capillary 226 (H) 70 - 99 mg/dL  Glucose, capillary     Status: Abnormal   Collection Time: 02/14/19  9:39 PM  Result Value Ref Range   Glucose-Capillary 238 (H) 70 - 99 mg/dL   Comment 1 Notify RN    Comment 2 Document in Chart  Basic metabolic panel     Status: Abnormal   Collection Time: 02/15/19  6:06 AM  Result Value Ref Range   Sodium 139 135 - 145 mmol/L   Potassium 4.3 3.5 - 5.1 mmol/L   Chloride 99 98 - 111 mmol/L   CO2 31 22 - 32 mmol/L   Glucose, Bld 214 (H) 70 - 99 mg/dL   BUN 60 (H) 8 - 23 mg/dL   Creatinine, Ser 2.34 (H) 0.44 - 1.00 mg/dL   Calcium 8.9 8.9 - 10.3 mg/dL   GFR calc non Af Amer 19 (L) >60 mL/min   GFR calc Af Amer 22 (L) >60 mL/min   Anion gap 9 5 - 15    Comment: Performed at [facility], [address], [place], [place] [address]  Glucose, capillary     Status: Abnormal   Collection Time: 02/15/19  7:16 AM  Result Value Ref Range    Glucose-Capillary 201 (H) 70 - 99 mg/dL  Glucose, capillary     Status: Abnormal   Collection Time: 02/15/19 11:12 AM  Result Value Ref Range   Glucose-Capillary 208 (H) 70 - 99 mg/dL    No results found.  Assessment/Plan **AKI on CKD:  No clear reversible etiology for her worsening renal function.  Thankfully it's been improving during this hospitalization in setting of obligatory diuresis.  Will avoid nephrotoxins, avoid hypotension, works towards euvolemia with diuresis.  Will check UA for completeness.   **acute on chronic HFpEF:  Per above continue with diuresis.  Responding well to lasix 40 IV BID, will continue for now in combo with low na diet.    **Anemia: Hb in 9s at presentation, this is her baseline. No iron indices since 2016, check.  No indication for ESA at this time.   **DM: A1c 3/13 8.9.  Care per primary, work towards improving glycemic control as able.   **HTN: normotensive this AM.   **Humerus fracture:  Pain management per primary.  Looks like her outpt eval will be delayed due to [facility] closure.  **A fib: rate controlled. Not on AC due to falls.    Justin Mend 02/15/2019, 12:03 PM

## 2019-02-16 ENCOUNTER — Ambulatory Visit: Payer: Self-pay | Admitting: Orthopedic Surgery

## 2019-02-16 DIAGNOSIS — I4891 Unspecified atrial fibrillation: Secondary | ICD-10-CM

## 2019-02-16 DIAGNOSIS — I5021 Acute systolic (congestive) heart failure: Secondary | ICD-10-CM

## 2019-02-16 LAB — BASIC METABOLIC PANEL
Anion gap: 10 (ref 5–15)
BUN: 63 mg/dL — ABNORMAL HIGH (ref 8–23)
CO2: 32 mmol/L (ref 22–32)
Calcium: 9.2 mg/dL (ref 8.9–10.3)
Chloride: 99 mmol/L (ref 98–111)
Creatinine, Ser: 2.46 mg/dL — ABNORMAL HIGH (ref 0.44–1.00)
GFR, EST AFRICAN AMERICAN: 20 mL/min — AB (ref >60)
GFR, EST NON AFRICAN AMERICAN: 18 mL/min — AB (ref >60)
Glucose, Bld: 178 mg/dL — ABNORMAL HIGH (ref 70–99)
Potassium: 3.8 mmol/L (ref 3.5–5.1)
Sodium: 141 mmol/L (ref 135–145)

## 2019-02-16 LAB — CBC
HCT: 35 % — ABNORMAL LOW (ref 36.0–46.0)
Hemoglobin: 9.7 g/dL — ABNORMAL LOW (ref 12.0–15.0)
MCH: 23.3 pg — ABNORMAL LOW (ref 26.0–34.0)
MCHC: 27.7 g/dL — ABNORMAL LOW (ref 30.0–36.0)
MCV: 83.9 fL (ref 80.0–100.0)
Platelets: 193 10*3/uL (ref 150–400)
RBC: 4.17 MIL/uL (ref 3.87–5.11)
RDW: 17.4 % — ABNORMAL HIGH (ref 11.5–15.5)
WBC: 8.8 10*3/uL (ref 4.0–10.5)
nRBC: 0.2 % (ref 0.0–0.2)

## 2019-02-16 LAB — GLUCOSE, CAPILLARY
Glucose-Capillary: 163 mg/dL — ABNORMAL HIGH (ref 70–99)
Glucose-Capillary: 170 mg/dL — ABNORMAL HIGH (ref 70–99)
Glucose-Capillary: 216 mg/dL — ABNORMAL HIGH (ref 70–99)
Glucose-Capillary: 184 mg/dL — ABNORMAL HIGH (ref 70–99)

## 2019-02-16 LAB — IRON AND TIBC
Iron: 41 ug/dL (ref 28–170)
Saturation Ratios: 8 % — ABNORMAL LOW (ref 10.4–31.8)
TIBC: 528 ug/dL — ABNORMAL HIGH (ref 250–450)
UIBC: 487 ug/dL

## 2019-02-16 LAB — FERRITIN: Ferritin: 20 ng/mL (ref 11–307)

## 2019-02-16 MED ORDER — SODIUM CHLORIDE 0.9 % IV SOLN
125.0000 mg | Freq: Every day | INTRAVENOUS | Status: DC
  Administered 2019-02-16 – 2019-02-20 (×5): 125 mg via INTRAVENOUS
  Filled 2019-02-16 (×8): qty 10

## 2019-02-16 MED ORDER — SODIUM CHLORIDE 0.9 % IV SOLN: 125.0000 mg | Freq: Once | INTRAVENOUS | Status: DC

## 2019-02-16 MED ORDER — SODIUM CHLORIDE 0.9 % IV SOLN
INTRAVENOUS | Status: DC | PRN
  Administered 2019-02-16: 250 mL via INTRAVENOUS

## 2019-02-16 MED ORDER — SODIUM CHLORIDE 0.9 % IV SOLN
125.0000 mg | Freq: Every day | INTRAVENOUS | Status: DC
  Filled 2019-02-16: qty 10

## 2019-02-16 NOTE — Progress Notes (Signed)
PROGRESS NOTE  [name] ZJQ:[id number] DOB: 04-12-1933 DOA: 02/12/2019 PCP: [place]mond [employer], MD  Brief History:  85 year old female with a history of right-sided inflammatory breast cancer in remission, CKD stage IV, atrial fibrillation, stroke, diabetes mellitus with retinopathy, hypertension, hyperlipidemia, B12 deficiency, frequent falls presenting from [place] with reported fever and altered mental status.  Unfortunately, the patient is a poor historian, and attempts to call the family were unsuccessful initally.  Nevertheless, the patient was sent to the emergency department for the above complaints.  The patient herself stated that she was told that she had confusion and fevers.  During evaluation, the patient was noted to be fluid overloaded with anasarca.  She was afebrile hemodynamically stable saturating 92-95% room air.  WBC was 7.7.  She was noted to have serum creatinine 2.57.  She was started on intravenous furosemide.  Although the exact history is not completely clear, it is also been noted in the medical record that the patient has had worsening lower extremity edema and abdominal wall edema at least for the past week.  The patient had a recent admit to the hospital from 01/03/2019 through 01/08/2019 for increasing generalized weakness and frequent falls.  She was diagnosed with a UTI.  She was sent to a skilled nursing facility at that time.  Since that discharge, the patient has been transition from furosemide to torsemide, but it is unclear exactly when this transition occurred.  Assessment/Plan: Acute systolic CHF/anasarca -Continue IV furosemide -Daily weights--NEG 5 kg (123.3 kg--->118.3kg) -Accurate I's and O's -01/04/2019 echo EF 45- 50%, no WMA, elevated RV pressure 58.0.  Dilated IVC; moderate TR, mild decreased RV function -Personally view chest x-ray--increased interstitial markings, left pleural effusion -suspect degree of Cor Pulmonale, possibly  due to undiagnosed OSA -pt remains fluid overloaded with JVD  Acute on chronic renal failure--CKD stage IV -Baseline creatinine 1.6-1.8 -Presented with serum creatinine 2.57 -No hydronephrosis noted on CT abdomen and pelvis -Urinalysis bland -renal consult appreciated -will need to tolerate worse renal function for improved pulm function  Chronic atrial fibrillation -CHADSVASc = 7 (CHF, HTN, Age, Female, DM, CAD) -Not on anticoagulation secondary to history of frequent falls -Continue metoprolol -Personally reviewed EKG--atrial fibrillation, no ST-T wave changes -continue ASA 81 mg daily  Acute metabolic encephalopathy -no fever noted since admission -B12--295 -TSH--3.249 -Free T4--0.94 -ammonia--14 -grandson (POA) states pt has had gradual cognitive decline over last 4-5 months -A&Ox2 during my exam -likely due to CHF and Acute on chronic renal failure  Diabetes mellitus type 2, uncontrolled with hyperglycemia -Holding glipizide -Continue reduced dose Lantus -NovoLog sliding scale -Hemoglobin A1c--8.9 -Holding Januvia -add novolog 3 units with meals  Essential hypertension -Restarted metoprolol tartrate  Hyperlipidemia -Restart Crestor  Fibromyalgia -Restart Lyrica and Paxil  Recent right humerus fracture -Follow-up orthopedics, Dr. Aline Brochure  Goals of Care -DNR -anticipate continued gradual decline -difficult family dynamics -palliative consult    Disposition Plan:   SNF 2-3 days  Family Communication:  updated grandson (POA) at bedside 3/16 Total time spent 35 minutes.  Greater than 50% spent face to face counseling and coordinating care.  Consultants:  none  Code Status:  DNR  DVT Prophylaxis:  SCDs   Procedures: As Listed in Progress Note Above  Antibiotics: cephalexin    Subjective: Pt is pleasantly confused.  Patient denies fevers, chills, headache, chest pain, dyspnea, nausea, vomiting, diarrhea, abdominal pain,  dysuria, hematuria, hematochezia, and melena.   Objective: Vitals:  02/16/19 0500 02/16/19 0525 02/16/19 0849 02/16/19 1300  BP:  (!) 147/94 (!) 125/102 128/89  Pulse:  100 90 (!) 102  Resp:  19  20  Temp:  98.2 F (36.8 C)  98.3 F (36.8 C)  TempSrc:  Oral  Oral  SpO2:  93%  95%  Weight: 118.3 kg     Height:        Intake/Output Summary (Last 24 hours) at 02/16/2019 1728 Last data filed at 02/16/2019 1300 Gross per 24 hour  Intake 480 ml  Output 2100 ml  Net -1620 ml   Weight change:  Exam:   General:  Pt is alert, follows commands appropriately, not in acute distress  HEENT: No icterus, No thrush, No neck mass, [place]/AT  Cardiovascular: RRR, S1/S2, no rubs, no gallops  Respiratory: bibasilar rales, no wheeze  Abdomen: Soft/+BS, non tender, non distended, no guarding  Extremities: 2 + LE edema, No lymphangitis, No petechiae, No rashes, no synovitis   Data Reviewed: I have personally reviewed following labs and imaging studies Basic Metabolic Panel: Recent Labs  Lab 02/12/19 1539 02/13/19 0417 02/14/19 0607 02/15/19 0606 02/16/19 0544  NA 144 145 140 139 141  K 4.2 4.9 4.2 4.3 3.8  CL 107 106 101 99 99  CO2 $Re'30 27 31 31 'JXC$ 32  GLUCOSE 224* 172* 202* 214* 178*  BUN 60* 59* 58* 60* 63*  CREATININE 2.57* 2.56* 2.41* 2.34* 2.46*  CALCIUM 8.9 9.1 9.0 8.9 9.2   Liver Function Tests: Recent Labs  Lab 02/12/19 1539  AST 21  ALT 12  ALKPHOS 29*  BILITOT 0.3  PROT 5.8*  ALBUMIN 2.8*   No results for input(s): LIPASE, AMYLASE in the last 168 hours. Recent Labs  Lab 02/13/19 0934  AMMONIA 14   Coagulation Profile: No results for input(s): INR, PROTIME in the last 168 hours. CBC: Recent Labs  Lab 02/12/19 1539 02/16/19 0544  WBC 7.7 8.8  NEUTROABS 5.5  --   HGB 9.1* 9.7*  HCT 32.3* 35.0*  MCV 84.6 83.9  PLT 154 193   Cardiac Enzymes: Recent Labs  Lab 02/12/19 1539 02/13/19 0934  CKTOTAL  --  35*  TROPONINI 0.03*  --    BNP: Invalid  input(s): POCBNP CBG: Recent Labs  Lab 02/15/19 1611 02/15/19 2118 02/16/19 0740 02/16/19 1146 02/16/19 1626  GLUCAP 187* 192* 163* 170* 216*   HbA1C: No results for input(s): HGBA1C in the last 72 hours. Urine analysis:    Component Value Date/Time   COLORURINE YELLOW 02/12/2019 [place] 02/12/2019 1734   LABSPEC 1.011 02/12/2019 1734   PHURINE 5.0 02/12/2019 1734   GLUCOSEU NEGATIVE 02/12/2019 1734   GLUCOSEU neg 04/07/2010   HGBUR NEGATIVE 02/12/2019 1734   BILIRUBINUR NEGATIVE 02/12/2019 1734   KETONESUR NEGATIVE 02/12/2019 1734   PROTEINUR NEGATIVE 02/12/2019 1734   UROBILINOGEN 0.2 09/27/2014 1630   NITRITE NEGATIVE 02/12/2019 1734   LEUKOCYTESUR NEGATIVE 02/12/2019 1734   Sepsis Labs: $RemoveBefo'@LABRCNTIP'NNjnQxCEdAu$ (procalcitonin:4,lacticidven:4) ) Recent Results (from the past 240 hour(s))  MRSA PCR Screening     Status: Abnormal   Collection Time: 02/12/19  8:40 PM  Result Value Ref Range Status   MRSA by PCR POSITIVE (A) NEGATIVE Final    Comment:        The GeneXpert MRSA Assay (FDA approved for NASAL specimens only), is one component of a comprehensive MRSA colonization surveillance program. It is not intended to diagnose MRSA infection nor to guide or monitor treatment for MRSA infections. RESULT CALLED  TO, READ BACK BY AND VERIFIED WITH: TATE,R ON 02/12/19 AT 2300 BY LOY,C Performed at [facility], [address] [place], [place] [address]   Respiratory Panel by PCR     Status: None   Collection Time: 02/13/19  9:11 AM  Result Value Ref Range Status   Adenovirus NOT DETECTED NOT DETECTED Final   Coronavirus 229E NOT DETECTED NOT DETECTED Final    Comment: (NOTE) The Coronavirus on the Respiratory Panel, DOES NOT test for the novel  Coronavirus (2019 nCoV)    Coronavirus HKU1 NOT DETECTED NOT DETECTED Final   Coronavirus NL63 NOT DETECTED NOT DETECTED Final   Coronavirus OC43 NOT DETECTED NOT DETECTED Final   Metapneumovirus NOT DETECTED NOT  DETECTED Final   Rhinovirus / Enterovirus NOT DETECTED NOT DETECTED Final   Influenza A NOT DETECTED NOT DETECTED Final   Influenza B NOT DETECTED NOT DETECTED Final   Parainfluenza Virus 1 NOT DETECTED NOT DETECTED Final   Parainfluenza Virus 2 NOT DETECTED NOT DETECTED Final   Parainfluenza Virus 3 NOT DETECTED NOT DETECTED Final   Parainfluenza Virus 4 NOT DETECTED NOT DETECTED Final   Respiratory Syncytial Virus NOT DETECTED NOT DETECTED Final   Bordetella pertussis NOT DETECTED NOT DETECTED Final   Chlamydophila pneumoniae NOT DETECTED NOT DETECTED Final   Mycoplasma pneumoniae NOT DETECTED NOT DETECTED Final    Comment: Performed at [facility], [address], [place], [place] [address]     Scheduled Meds: . aspirin EC  81 mg Oral Daily  . cephALEXin  500 mg Oral Q8H  . Chlorhexidine Gluconate Cloth  6 each Topical Q0600  . furosemide  40 mg Intravenous Q12H  . insulin aspart  0-5 Units Subcutaneous QHS  . insulin aspart  0-9 Units Subcutaneous TID WC  . insulin glargine  6 Units Subcutaneous Daily  . metoprolol tartrate  37.5 mg Oral BID  . mupirocin ointment  1 application Nasal BID  . PARoxetine  40 mg Oral Daily  . potassium chloride SA  20 mEq Oral Daily  . pregabalin  50 mg Oral Daily  . rosuvastatin  10 mg Oral q1800  . sodium chloride flush  3 mL Intravenous Q12H   Continuous Infusions: . sodium chloride    . sodium chloride 250 mL (02/16/19 1251)  . ferric gluconate (FERRLECIT/NULECIT) IV 125 mg (02/16/19 1253)    Procedures/Studies: Ct Abdomen Pelvis Wo Contrast  Result Date: 02/12/2019 CLINICAL DATA:  85 year old female with history of abdominal distension, fever and altered mental status. Increasing falls. EXAM: CT ABDOMEN AND PELVIS WITHOUT CONTRAST TECHNIQUE: Multidetector CT imaging of the abdomen and pelvis was performed following the standard protocol without IV contrast. COMPARISON:  CT the abdomen and pelvis 11/10/2016. FINDINGS: Lower chest:  Cardiomegaly. Atherosclerotic calcifications in the descending thoracic aorta as well as the left anterior descending, left circumflex and right coronary arteries. Calcifications of the aortic valve and mitral annulus. Moderate left pleural effusion. Areas of passive atelectasis are noted in the left lung base. Edema throughout the lower left chest wall and visualized portions of the left breast. Hepatobiliary: No definite suspicious cystic or solid hepatic lesions are confidently identified on today's noncontrast CT examination. Status post cholecystectomy. Pancreas: Pancreatic atrophy. No definite pancreatic mass or peripancreatic fluid or inflammatory changes. Spleen: Unremarkable. Adrenals/Urinary Tract: 2.3 cm low-attenuation lesion in the medial aspect of the upper pole of the left kidney, incompletely characterized on today's noncontrast CT examination, but statistically likely a cyst. Unenhanced appearance of the right  kidney is normal. No hydroureteronephrosis. Urinary bladder is unremarkable. Stomach/Bowel: Normal appearance of the stomach. No pathologic dilatation of small bowel or colon. Numerous colonic diverticulae are noted. Due to surrounding ascites, accurate assessment for focal inflammation indicative of acute diverticulitis is not possible on today's examination, but no focal inflammatory changes are confidently identified given these limitations. Appendix is not confidently identified may be surgically absent. Vascular/Lymphatic: Aortic atherosclerosis. No lymphadenopathy noted in the abdomen or pelvis. Reproductive: Status post hysterectomy. Ovaries are not confidently identified may be surgically absent or atrophic. Other: Small volume of ascites. No pneumoperitoneum. Diffuse body wall edema. Musculoskeletal: Status post PLIF from L3-L5 with interbody grafts at L3-L4 and L4-L5. There are no aggressive appearing lytic or blastic lesions noted in the visualized portions of the skeleton.  IMPRESSION: 1. Small volume of ascites, left pleural effusion and diffuse body wall edema; imaging findings suggestive of a state of anasarca. 2. No other acute findings are confidently identified in the abdomen or pelvis to account for the patient's symptoms. 3. Cardiomegaly. 4. Aortic atherosclerosis, in addition to least 3 vessel coronary artery disease. 5. There are calcifications of the aortic valve and mitral annulus. Echocardiographic correlation for evaluation of potential valvular dysfunction may be warranted if clinically indicated. 6. Colonic diverticulosis. Electronically Signed   By: Vinnie Langton M.D.   On: 02/12/2019 16:50   Dg Chest 1 View  Result Date: 02/12/2019 CLINICAL DATA:  Acute presentation with weakness. EXAM: CHEST  1 VIEW COMPARISON:  01/03/2019 FINDINGS: Artifact overlies the chest. Left Port-A-Cath tip in the SVC above the right atrium. Right lung remains clear. There is worsened infiltrate and volume loss in the left lower lobe. There is probably a left effusion as well. IMPRESSION: Worsening of disease on the left. Probable left lower lobe pneumonia and collapse. Suspected left effusion. Electronically Signed   By: Nelson Chimes M.D.   On: 02/12/2019 16:33   Dg Shoulder Right  Result Date: 02/11/2019 CLINICAL DATA:  Right shoulder pain after a fall tonight EXAM: RIGHT SHOULDER - 2+ VIEW COMPARISON:  None. FINDINGS: Mildly comminuted fracture of the humeral shaft with mild angulation. A sharp fracture fragment is present anteriorly and medially. Osteopenic appearance. Located glenohumeral and acromioclavicular joints. IMPRESSION: 1. Humeral shaft fracture as described on dedicated exam. 2. Located shoulder. Electronically Signed   By: Monte Fantasia M.D.   On: 02/11/2019 05:02   Dg Humerus Right  Result Date: 02/11/2019 CLINICAL DATA:  Fall with right arm pain EXAM: RIGHT HUMERUS - 2+ VIEW COMPARISON:  None. FINDINGS: Mildly comminuted fracture of the mid humeral shaft  with apex ventral and medial angulation. A sharp bone fragment is present medially. Located glenohumeral and acromioclavicular joints. IMPRESSION: Comminuted fracture of the humeral shaft with fracture likely extending to the surgical neck. A sharp bone fragment is present medially. Electronically Signed   By: Monte Fantasia M.D.   On: 02/11/2019 05:03    Orson Eva, DO  [facility] Pager [phone]  If 7PM-7AM, please contact night-coverage [email or web address] Password [email or web address] 02/16/2019, 5:28 PM   LOS: 4 days

## 2019-02-16 NOTE — Progress Notes (Signed)
[facility] ROUNDING NOTE   Subjective:   Brief history 85 year old lady with history of chronic kidney disease stage III/IV history of diabetes mellitus type 2 hypertension DJD history of stroke history of congestive heart failure ejection fraction of 45 to 50%.  History of breast cancer in remission.  Presented to the emergency room with volume overload and acute on chronic kidney disease appears that the creatinine is increased from 1.8-1.9 at baseline to 2.5.  Blood pressure 147/94 pulse 100 temperature 98.2 O2 sats 93% 8141 potassium 3.8 chloride 99 CO2 32 creatinine 2.46 BUN 63 glucose 178 calcium 9.2 iron saturations 8% hemoglobin 9.7 platelets 193 WBC 8.8  Aspirin 81 mg daily, Keflex 500 mg every 8 hours, Lasix 40 mg IV every 12 hours, Lopressor 37.5 mg twice daily, Crestor 10 mg daily, Paxil 40 mg daily, insulin sliding scale, glargine 6 units nightly, Lyrica 50 mg daily, albuterol every 6 hours as needed.  Potassium 20 mEq daily  Urine output 1700 cc 02/15/2019 weight 02/13/2019 123.3 kg decreased to 118.3 kg 02/16/2019.  CT scan abdomen and pelvis 02/12/2019 small amount of ascites left pleural effusion diffuse edema of abdominal wall cardiomegaly atherosclerosis calcifications aortic valve colonic diverticulosis. Chest x-ray 02/12/2019 worsening left pleural effusion.    Objective:  Vital signs in last 24 hours:  Temp:  [98.2 F (36.8 C)-98.3 F (36.8 C)] 98.2 F (36.8 C) (03/16 0525) Pulse Rate:  [87-106] 90 (03/16 0849) Resp:  [18-19] 19 (03/16 0525) BP: (125-147)/(69-102) 125/102 (03/16 0849) SpO2:  [90 %-95 %] 93 % (03/16 0525) Weight:  [118.3 kg] 118.3 kg (03/16 0500)  Weight change:  Filed Weights   02/13/19 0630 02/14/19 0600 02/16/19 0500  Weight: 123.3 kg 121 kg 118.3 kg    Intake/Output: I/O last 3 completed shifts: In: 720 [P.O.:720] Out: 2900 [Urine:2900]   Intake/Output this shift:  No intake/output data recorded. Frail lady  nondistressed CVS-tachycardic.  Coarse systolic murmur JVP does not appear elevated RS- anteriorly appeared to be clear with no wheezes or rales ABD- BS present soft non-distended GU.  Collecting clear yellow urine EXT-diffuse pitting edema right upper extremity brace and sling   Basic Metabolic Panel: Recent Labs  Lab 02/12/19 1539 02/13/19 0417 02/14/19 0607 02/15/19 0606 02/16/19 0544  NA 144 145 140 139 141  K 4.2 4.9 4.2 4.3 3.8  CL 107 106 101 99 99  CO2 $Re'30 27 31 31 'hFK$ 32  GLUCOSE 224* 172* 202* 214* 178*  BUN 60* 59* 58* 60* 63*  CREATININE 2.57* 2.56* 2.41* 2.34* 2.46*  CALCIUM 8.9 9.1 9.0 8.9 9.2    Liver Function Tests: Recent Labs  Lab 02/12/19 1539  AST 21  ALT 12  ALKPHOS 29*  BILITOT 0.3  PROT 5.8*  ALBUMIN 2.8*   No results for input(s): LIPASE, AMYLASE in the last 168 hours. Recent Labs  Lab 02/13/19 0934  AMMONIA 14    CBC: Recent Labs  Lab 02/12/19 1539 02/16/19 0544  WBC 7.7 8.8  NEUTROABS 5.5  --   HGB 9.1* 9.7*  HCT 32.3* 35.0*  MCV 84.6 83.9  PLT 154 193    Cardiac Enzymes: Recent Labs  Lab 02/12/19 1539 02/13/19 0934  CKTOTAL  --  35*  TROPONINI 0.03*  --     BNP: Invalid input(s): POCBNP  CBG: Recent Labs  Lab 02/15/19 0716 02/15/19 1112 02/15/19 1611 02/15/19 2118 02/16/19 0740  GLUCAP 201* 208* 187* 192* 163*    Microbiology: Results for orders placed or performed during the  hospital encounter of 02/12/19  MRSA PCR Screening     Status: Abnormal   Collection Time: 02/12/19  8:40 PM  Result Value Ref Range Status   MRSA by PCR POSITIVE (A) NEGATIVE Final    Comment:        The GeneXpert MRSA Assay (FDA approved for NASAL specimens only), is one component of a comprehensive MRSA colonization surveillance program. It is not intended to diagnose MRSA infection nor to guide or monitor treatment for MRSA infections. RESULT CALLED TO, READ BACK BY AND VERIFIED WITH: TATE,R ON 02/12/19 AT 2300 BY  LOY,C Performed at [facility], [address] [place], [place] [address]   Respiratory Panel by PCR     Status: None   Collection Time: 02/13/19  9:11 AM  Result Value Ref Range Status   Adenovirus NOT DETECTED NOT DETECTED Final   Coronavirus 229E NOT DETECTED NOT DETECTED Final    Comment: (NOTE) The Coronavirus on the Respiratory Panel, DOES NOT test for the novel  Coronavirus (2019 nCoV)    Coronavirus HKU1 NOT DETECTED NOT DETECTED Final   Coronavirus NL63 NOT DETECTED NOT DETECTED Final   Coronavirus OC43 NOT DETECTED NOT DETECTED Final   Metapneumovirus NOT DETECTED NOT DETECTED Final   Rhinovirus / Enterovirus NOT DETECTED NOT DETECTED Final   Influenza A NOT DETECTED NOT DETECTED Final   Influenza B NOT DETECTED NOT DETECTED Final   Parainfluenza Virus 1 NOT DETECTED NOT DETECTED Final   Parainfluenza Virus 2 NOT DETECTED NOT DETECTED Final   Parainfluenza Virus 3 NOT DETECTED NOT DETECTED Final   Parainfluenza Virus 4 NOT DETECTED NOT DETECTED Final   Respiratory Syncytial Virus NOT DETECTED NOT DETECTED Final   Bordetella pertussis NOT DETECTED NOT DETECTED Final   Chlamydophila pneumoniae NOT DETECTED NOT DETECTED Final   Mycoplasma pneumoniae NOT DETECTED NOT DETECTED Final    Comment: Performed at [facility], [address], [place], [place] [address]    Coagulation Studies: No results for input(s): LABPROT, INR in the last 72 hours.  Urinalysis: No results for input(s): COLORURINE, LABSPEC, PHURINE, GLUCOSEU, HGBUR, BILIRUBINUR, KETONESUR, PROTEINUR, UROBILINOGEN, NITRITE, LEUKOCYTESUR in the last 72 hours.  Invalid input(s): APPERANCEUR    Imaging: No results found.   Medications:   . sodium chloride     . aspirin EC  81 mg Oral Daily  . cephALEXin  500 mg Oral Q8H  . Chlorhexidine Gluconate Cloth  6 each Topical Q0600  . furosemide  40 mg Intravenous Q12H  . insulin aspart  0-5 Units Subcutaneous QHS  . insulin aspart  0-9 Units  Subcutaneous TID WC  . insulin glargine  6 Units Subcutaneous Daily  . metoprolol tartrate  37.5 mg Oral BID  . mupirocin ointment  1 application Nasal BID  . PARoxetine  40 mg Oral Daily  . potassium chloride SA  20 mEq Oral Daily  . pregabalin  50 mg Oral Daily  . rosuvastatin  10 mg Oral q1800  . sodium chloride flush  3 mL Intravenous Q12H   sodium chloride, acetaminophen, albuterol, HYDROcodone-acetaminophen, ondansetron (ZOFRAN) IV, sodium chloride flush  Assessment/ Plan:   Acute on chronic kidney disease with no clear reversible etiology for worsening renal function she appears to be doing well with diuresis.  Her weight is down and her urine output is good.  She has stage IV chronic kidney disease at baseline is not a dialysis candidate.  Agree with avoiding nephrotoxins hypertension maintaining euvolemia.  Anemia has iron deficiency  would replete with IV iron.  Volume/hypertension.  Appears to be doing well blood pressure appropriate continue with diuresis.  Atrial fibrillation.  Rate controlled not candidate for anticoagulation secondary to falls  Diabetes mellitus as per primary team  Bones will check PTH   LOS: [place] $RemoveBefo'@TODAY'QpHRXxItVYv$ $Remov'@10'RhhXVf$ :05 AM

## 2019-02-16 NOTE — Care Management (Addendum)
Met with patient again to discuss disposition. She continues to express interest in going home. We talk through how that is not a good choice as she does not have 24/7 support for home and needs extensive support. She reluctantly agrees.

## 2019-02-16 NOTE — Progress Notes (Signed)
Inpatient Diabetes Program Recommendations  AACE/ADA: New Consensus Statement on Inpatient Glycemic Control   Target Ranges:  Prepandial:   less than 140 mg/dL      Peak postprandial:   less than 180 mg/dL (1-2 hours)      Critically ill patients:  140 - 180 mg/dL  Results for [name], [name] (MRN [id number]) as of 02/16/2019 08:11  Ref. Range 02/15/2019 07:16 02/15/2019 11:12 02/15/2019 16:11 02/15/2019 21:18 02/16/2019 07:40  Glucose-Capillary Latest Ref Range: 70 - 99 mg/dL 201 (H) 208 (H) 187 (H) 192 (H) 163 (H)    Review of Glycemic Control  Diabetes history: DM2 Outpatient Diabetes medications: Basaglar 12 units QHS, Novolog 8 units TID with meals, Glipizide 10 mg BID, Januvia 25 mg daily Current orders for Inpatient glycemic control: Lantus 6 units daily, Novolog 0-9 units TID with meals, Novolog 0-5 units QHS  Inpatient Diabetes Program Recommendations: Insulin - Meal Coverage: Please consider ordering Novolog 3 units TID with meals for meal coverage if patient eats at least 50% of meals.  Thanks, Barnie Alderman, RN, MSN, CDE Diabetes Coordinator Inpatient Diabetes Program [phone] (Team Pager from 8am to 5pm)

## 2019-02-17 LAB — BASIC METABOLIC PANEL
Anion gap: 11 (ref 5–15)
BUN: 63 mg/dL — ABNORMAL HIGH (ref 8–23)
CO2: 29 mmol/L (ref 22–32)
Calcium: 9.2 mg/dL (ref 8.9–10.3)
Chloride: 101 mmol/L (ref 98–111)
Creatinine, Ser: 2.34 mg/dL — ABNORMAL HIGH (ref 0.44–1.00)
GFR calc Af Amer: 22 mL/min — ABNORMAL LOW (ref >60)
GFR calc non Af Amer: 19 mL/min — ABNORMAL LOW (ref >60)
Glucose, Bld: 160 mg/dL — ABNORMAL HIGH (ref 70–99)
Potassium: 3.9 mmol/L (ref 3.5–5.1)
Sodium: 141 mmol/L (ref 135–145)

## 2019-02-17 LAB — PARATHYROID HORMONE, INTACT (NO CA): PTH: 42 pg/mL (ref 15–65)

## 2019-02-17 LAB — GLUCOSE, CAPILLARY
Glucose-Capillary: 164 mg/dL — ABNORMAL HIGH (ref 70–99)
Glucose-Capillary: 235 mg/dL — ABNORMAL HIGH (ref 70–99)
Glucose-Capillary: 179 mg/dL — ABNORMAL HIGH (ref 70–99)
Glucose-Capillary: 189 mg/dL — ABNORMAL HIGH (ref 70–99)

## 2019-02-17 MED ORDER — METOPROLOL TARTRATE 50 MG PO TABS
50.0000 mg | ORAL_TABLET | Freq: Two times a day (BID) | ORAL | Status: DC
  Administered 2019-02-17 – 2019-02-21 (×8): 50 mg via ORAL
  Filled 2019-02-17 (×8): qty 1

## 2019-02-17 MED ORDER — INSULIN ASPART 100 UNIT/ML ~~LOC~~ SOLN
0.0000 [IU] | Freq: Every day | SUBCUTANEOUS | Status: DC
  Administered 2019-02-20: 3 [IU] via SUBCUTANEOUS

## 2019-02-17 MED ORDER — INSULIN ASPART 100 UNIT/ML ~~LOC~~ SOLN
0.0000 [IU] | Freq: Three times a day (TID) | SUBCUTANEOUS | Status: DC
  Administered 2019-02-18 (×3): 3 [IU] via SUBCUTANEOUS
  Administered 2019-02-19 (×2): 5 [IU] via SUBCUTANEOUS
  Administered 2019-02-19 – 2019-02-20 (×2): 3 [IU] via SUBCUTANEOUS
  Administered 2019-02-20: 5 [IU] via SUBCUTANEOUS
  Administered 2019-02-20: 2 [IU] via SUBCUTANEOUS
  Administered 2019-02-21 (×2): 3 [IU] via SUBCUTANEOUS

## 2019-02-17 MED ORDER — INSULIN GLARGINE 100 UNIT/ML ~~LOC~~ SOLN
8.0000 [IU] | Freq: Every day | SUBCUTANEOUS | Status: DC
  Administered 2019-02-18 – 2019-02-21 (×4): 8 [IU] via SUBCUTANEOUS
  Filled 2019-02-17 (×6): qty 0.08

## 2019-02-17 NOTE — Progress Notes (Signed)
[facility] ROUNDING NOTE   Subjective:   Brief history 85 year old lady with history of chronic kidney disease stage III/IV history of diabetes mellitus type 2 hypertension DJD history of stroke history of congestive heart failure ejection fraction of 45 to 50%.  History of breast cancer in remission.  Presented to the emergency room with volume overload and acute on chronic kidney disease appears that the creatinine is increased from 1.8-1.9 at baseline to 2.5.  Blood pressure 124/82 pulse 105 Sodium 141 potassium 3.9 chloride 101 CO2 29 BUN 63 creatinine 2.34 glucose 160 calcium 9.2 iron saturations 8% WBC 8.8 hemoglobin 9.7 platelets 197  Aspirin 81 mg daily, Keflex 500 mg every 8 hours, Lasix 40 mg IV every 12 hours, Lopressor 37.5 mg twice daily, Crestor 10 mg daily, Paxil 40 mg daily, insulin sliding scale, glargine 6 units nightly, Lyrica 50 mg daily, albuterol every 6 hours as needed.  , no illicit 387 mg daily x8   Urine output 3800 cc 02/16/2019 weight 217.9 kg  CT scan abdomen and pelvis 02/12/2019 small amount of ascites left pleural effusion diffuse edema of abdominal wall cardiomegaly atherosclerosis calcifications aortic valve colonic diverticulosis. Chest x-ray 02/12/2019 worsening left pleural effusion.    Objective:  Vital signs in last 24 hours:  Temp:  [98.3 F (36.8 C)-98.4 F (36.9 C)] 98.4 F (36.9 C) (03/16 2118) Pulse Rate:  [93-105] 105 (03/17 0807) Resp:  [20] 20 (03/16 2118) BP: (124-131)/(64-89) 124/82 (03/17 0807) SpO2:  [89 %-95 %] 89 % (03/16 2118) Weight:  [117.9 kg] 117.9 kg (03/17 0609)  Weight change: -0.4 kg Filed Weights   02/14/19 0600 02/16/19 0500 02/17/19 0609  Weight: 121 kg 118.3 kg 117.9 kg    Intake/Output: I/O last 3 completed shifts: In: 844.1 [P.O.:720; I.V.:14.1; IV Piggyback:110] Out: 5643 [Urine:4150]   Intake/Output this shift:  No intake/output data recorded. Frail lady nondistressed CVS-tachycardic.   Coarse systolic murmur JVP does not appear elevated RS- anteriorly appeared to be clear with no wheezes or rales ABD- BS present soft non-distended GU.  Collecting clear yellow urine EXT-diffuse pitting edema right upper extremity brace and sling   Basic Metabolic Panel: Recent Labs  Lab 02/13/19 0417 02/14/19 0607 02/15/19 0606 02/16/19 0544 02/17/19 0425  NA 145 140 139 141 141  K 4.9 4.2 4.3 3.8 3.9  CL 106 101 99 99 101  CO2 $Re'27 31 31 'vOz$ 32 29  GLUCOSE 172* 202* 214* 178* 160*  BUN 59* 58* 60* 63* 63*  CREATININE 2.56* 2.41* 2.34* 2.46* 2.34*  CALCIUM 9.1 9.0 8.9 9.2 9.2    Liver Function Tests: Recent Labs  Lab 02/12/19 1539  AST 21  ALT 12  ALKPHOS 29*  BILITOT 0.3  PROT 5.8*  ALBUMIN 2.8*   No results for input(s): LIPASE, AMYLASE in the last 168 hours. Recent Labs  Lab 02/13/19 0934  AMMONIA 14    CBC: Recent Labs  Lab 02/12/19 1539 02/16/19 0544  WBC 7.7 8.8  NEUTROABS 5.5  --   HGB 9.1* 9.7*  HCT 32.3* 35.0*  MCV 84.6 83.9  PLT 154 193    Cardiac Enzymes: Recent Labs  Lab 02/12/19 1539 02/13/19 0934  CKTOTAL  --  35*  TROPONINI 0.03*  --     BNP: Invalid input(s): POCBNP  CBG: Recent Labs  Lab 02/16/19 1146 02/16/19 1626 02/16/19 2233 02/17/19 0743 02/17/19 1058  GLUCAP 170* 216* 184* 164* 235*    Microbiology: Results for orders placed or performed during the hospital encounter  of 02/12/19  MRSA PCR Screening     Status: Abnormal   Collection Time: 02/12/19  8:40 PM  Result Value Ref Range Status   MRSA by PCR POSITIVE (A) NEGATIVE Final    Comment:        The GeneXpert MRSA Assay (FDA approved for NASAL specimens only), is one component of a comprehensive MRSA colonization surveillance program. It is not intended to diagnose MRSA infection nor to guide or monitor treatment for MRSA infections. RESULT CALLED TO, READ BACK BY AND VERIFIED WITH: TATE,R ON 02/12/19 AT 2300 BY LOY,C Performed at [facility], [address] [place], [place] [address]   Respiratory Panel by PCR     Status: None   Collection Time: 02/13/19  9:11 AM  Result Value Ref Range Status   Adenovirus NOT DETECTED NOT DETECTED Final   Coronavirus 229E NOT DETECTED NOT DETECTED Final    Comment: (NOTE) The Coronavirus on the Respiratory Panel, DOES NOT test for the novel  Coronavirus (2019 nCoV)    Coronavirus HKU1 NOT DETECTED NOT DETECTED Final   Coronavirus NL63 NOT DETECTED NOT DETECTED Final   Coronavirus OC43 NOT DETECTED NOT DETECTED Final   Metapneumovirus NOT DETECTED NOT DETECTED Final   Rhinovirus / Enterovirus NOT DETECTED NOT DETECTED Final   Influenza A NOT DETECTED NOT DETECTED Final   Influenza B NOT DETECTED NOT DETECTED Final   Parainfluenza Virus 1 NOT DETECTED NOT DETECTED Final   Parainfluenza Virus 2 NOT DETECTED NOT DETECTED Final   Parainfluenza Virus 3 NOT DETECTED NOT DETECTED Final   Parainfluenza Virus 4 NOT DETECTED NOT DETECTED Final   Respiratory Syncytial Virus NOT DETECTED NOT DETECTED Final   Bordetella pertussis NOT DETECTED NOT DETECTED Final   Chlamydophila pneumoniae NOT DETECTED NOT DETECTED Final   Mycoplasma pneumoniae NOT DETECTED NOT DETECTED Final    Comment: Performed at [facility], [address] [place], [place] [address]    Coagulation Studies: No results for input(s): LABPROT, INR in the last 72 hours.  Urinalysis: No results for input(s): COLORURINE, LABSPEC, PHURINE, GLUCOSEU, HGBUR, BILIRUBINUR, KETONESUR, PROTEINUR, UROBILINOGEN, NITRITE, LEUKOCYTESUR in the last 72 hours.  Invalid input(s): APPERANCEUR    Imaging: No results found.   Medications:   . sodium chloride    . sodium chloride 250 mL (02/16/19 1251)  . ferric gluconate (FERRLECIT/NULECIT) IV 125 mg (02/16/19 1253)   . aspirin EC  81 mg Oral Daily  . cephALEXin  500 mg Oral Q8H  . Chlorhexidine Gluconate Cloth  6 each Topical Q0600  . furosemide  40 mg Intravenous Q12H  . insulin aspart   0-5 Units Subcutaneous QHS  . insulin aspart  0-9 Units Subcutaneous TID WC  . insulin glargine  6 Units Subcutaneous Daily  . metoprolol tartrate  37.5 mg Oral BID  . mupirocin ointment  1 application Nasal BID  . PARoxetine  40 mg Oral Daily  . potassium chloride SA  20 mEq Oral Daily  . pregabalin  50 mg Oral Daily  . rosuvastatin  10 mg Oral q1800  . sodium chloride flush  3 mL Intravenous Q12H   sodium chloride, sodium chloride, acetaminophen, albuterol, HYDROcodone-acetaminophen, ondansetron (ZOFRAN) IV, sodium chloride flush  Assessment/ Plan:   Acute on chronic kidney disease with no clear reversible etiology for worsening renal function she appears to be doing well with diuresis.  Her weight is down and her urine output is good.  She has stage IV chronic kidney disease at baseline  is not a dialysis candidate.  Agree with avoiding nephrotoxins hypertension maintaining euvolemia.  Patient appears to be doing well this morning.  She is responding well to IV diuresis  Anemia has iron deficiency would replete with IV iron.  Volume/hypertension.  Appears to be doing well blood pressure appropriate continue with diuresis.  Atrial fibrillation.  Rate controlled not candidate for anticoagulation secondary to falls  Diabetes mellitus as per primary team  Bones PTH 42   LOS: [place] $RemoveBefor'@TODAY'[email or web address]$ $Remove'@11'YVNmZCu$ :21 AM

## 2019-02-17 NOTE — Progress Notes (Signed)
PROGRESS NOTE  [name] NTZ:[id number] DOB: 03-Oct-1933 DOA: 02/12/2019 PCP: Redmond School, MD  Brief History: 85 year old female with a history of right-sided inflammatory breast cancer in remission, CKD stage IV, atrial fibrillation, stroke, diabetes mellitus with retinopathy, hypertension, hyperlipidemia, B12 deficiency, frequent falls presenting from [place] with reported fever and altered mental status. Unfortunately, the patient is a poor historian,and attempts to call the family were unsuccessful initally. Nevertheless, the patient was sent to the emergency department for the above complaints. The patient herself stated that she was told that she had confusion and fevers. During evaluation, the patient was noted to be fluid overloaded with anasarca. She was afebrile hemodynamically stable saturating 92-95% room air. WBC was 7.7. She was noted to have serum creatinine 2.57.She was started on intravenous furosemide. Although the exact history is not completely clear, it is also been noted in the medical record that the patient has had worsening lower extremity edema and abdominal wall edema at least for the past week. The patient had a recent admit to the hospital from 01/03/2019 through 01/08/2019 for increasing generalized weakness and frequent falls. She was diagnosed with a UTI. She was sent to a skilled nursing facility at that time. Since that discharge, the patient has been transition from furosemide to torsemide, but it is unclear exactly when this transition occurred.  Assessment/Plan: Acute systolic CHF/anasarca -Continue IV furosemide -Daily weights--NEG 5.4 kg (123.3 kg--->117.9kg) -Accurate I's and O's--NEG 8 .3L -01/04/2019 echo EF 45- 50%, no WMA, elevated RV pressure 58.0. Dilated IVC;moderate TR, mild decreased RV function -Personally view chest x-ray--increased interstitial markings, left pleural effusion -suspect degree of Cor  Pulmonale, possibly due to undiagnosed OSA -pt remains fluid overloaded with JVD  Acute on chronic renal failure--CKD stage IV -Baseline creatinine 1.6-1.8 -Presented with serum creatinine 2.57 -No hydronephrosis noted on CT abdomen and pelvis -Urinalysis bland -renal consult appreciated -will need to tolerate worse renal function for improved pulm function  Chronic atrial fibrillation -CHADSVASc = 7 (CHF, HTN, Age, Female, DM, CAD) -Not on anticoagulation secondary to history of frequent falls -Continue metoprolol -Personally reviewed EKG--atrial fibrillation, no ST-T wave changes -continue ASA 81 mg daily -increase metoprolol to 50 mg bid for better rate control  Acute metabolic encephalopathy -no fever noted since admission -B12--295 -TSH--3.249 -Free T4--0.94 -ammonia--14 -grandson (POA) states pt has had gradual cognitive decline over last 4-5 months -A&Ox2 during my exam -due to CHF, acute on chronic renal failure, hospital delirium  Diabetes mellitus type 2, uncontrolled with hyperglycemia -Holding glipizide -Increase Lantus to 8 units -change to moderate SSI -NovoLog sliding scale -Hemoglobin A1c--8.9 -Holding Januvia  Essential hypertension -Restarted metoprolol tartrate  Hyperlipidemia -Restart Crestor  Fibromyalgia -Restart Lyrica and Paxil  Recent right humerus fracture -Follow-up orthopedics, Dr. Aline Brochure  Goals of Care -DNR -anticipate continued gradual functional and cognitive -long discussion with grandson (HPOA)-->currently plan to d/c back to SNF; treat the treatable for now -difficult family dynamics -palliative consult    Disposition Plan:SNF 2-3days  Family Communication:updated grandson (POA) at bedside 3/16 Total time spent 35 minutes.  Greater than 50% spent face to face counseling and coordinating care.  Consultants:none  Code Status: DNR  DVT Prophylaxis: SCDs   Procedures: As Listed in  Progress Note Above  Antibiotics: Cephalexin 3/12>>>3/17   Total time spent 35 minutes.  Greater than 50% spent face to face counseling and coordinating care.    Subjective: Pt is pleasantly confused.  She wants  to go home.  She denies cp, sob, n/v/d, abd pain.  No f/c  Objective: Vitals:   02/16/19 2118 02/17/19 0609 02/17/19 0807 02/17/19 1322  BP: 131/64  124/82 136/61  Pulse: 93  (!) 105 (!) 104  Resp: 20   18  Temp: 98.4 F (36.9 C)   97.6 F (36.4 C)  TempSrc: Oral   Oral  SpO2: (!) 89%   96%  Weight:  117.9 kg    Height:        Intake/Output Summary (Last 24 hours) at 02/17/2019 1705 Last data filed at 02/17/2019 0606 Gross per 24 hour  Intake 364.11 ml  Output 2050 ml  Net -1685.89 ml   Weight change: -0.4 kg Exam:   General:  Pt is alert, follows commands appropriately, not in acute distress  HEENT: No icterus, No thrush, No neck mass, [place]/AT  Cardiovascular: IRRR, S1/S2, no rubs, no gallops  Respiratory: bibasilar rales, no wheeze  Abdomen: Soft/+BS, non tender, non distended, no guarding  Extremities: 2 + LE edema, No lymphangitis, No petechiae, No rashes, no synovitis   Data Reviewed: I have personally reviewed following labs and imaging studies Basic Metabolic Panel: Recent Labs  Lab 02/13/19 0417 02/14/19 0607 02/15/19 0606 02/16/19 0544 02/17/19 0425  NA 145 140 139 141 141  K 4.9 4.2 4.3 3.8 3.9  CL 106 101 99 99 101  CO2 $Re'27 31 31 'ugn$ 32 29  GLUCOSE 172* 202* 214* 178* 160*  BUN 59* 58* 60* 63* 63*  CREATININE 2.56* 2.41* 2.34* 2.46* 2.34*  CALCIUM 9.1 9.0 8.9 9.2 9.2   Liver Function Tests: Recent Labs  Lab 02/12/19 1539  AST 21  ALT 12  ALKPHOS 29*  BILITOT 0.3  PROT 5.8*  ALBUMIN 2.8*   No results for input(s): LIPASE, AMYLASE in the last 168 hours. Recent Labs  Lab 02/13/19 0934  AMMONIA 14   Coagulation Profile: No results for input(s): INR, PROTIME in the last 168 hours. CBC: Recent Labs  Lab 02/12/19 1539  02/16/19 0544  WBC 7.7 8.8  NEUTROABS 5.5  --   HGB 9.1* 9.7*  HCT 32.3* 35.0*  MCV 84.6 83.9  PLT 154 193   Cardiac Enzymes: Recent Labs  Lab 02/12/19 1539 02/13/19 0934  CKTOTAL  --  35*  TROPONINI 0.03*  --    BNP: Invalid input(s): POCBNP CBG: Recent Labs  Lab 02/16/19 1146 02/16/19 1626 02/16/19 2233 02/17/19 0743 02/17/19 1058  GLUCAP 170* 216* 184* 164* 235*   HbA1C: No results for input(s): HGBA1C in the last 72 hours. Urine analysis:    Component Value Date/Time   COLORURINE YELLOW 02/12/2019 [place] 02/12/2019 1734   LABSPEC 1.011 02/12/2019 1734   PHURINE 5.0 02/12/2019 1734   GLUCOSEU NEGATIVE 02/12/2019 1734   GLUCOSEU neg 04/07/2010   HGBUR NEGATIVE 02/12/2019 1734   BILIRUBINUR NEGATIVE 02/12/2019 1734   KETONESUR NEGATIVE 02/12/2019 1734   PROTEINUR NEGATIVE 02/12/2019 1734   UROBILINOGEN 0.2 09/27/2014 1630   NITRITE NEGATIVE 02/12/2019 1734   LEUKOCYTESUR NEGATIVE 02/12/2019 1734   Sepsis Labs: $RemoveBefo'@[email or web address]'[email or web address]$ (procalcitonin:4,lacticidven:4) ) Recent Results (from the past 240 hour(s))  MRSA PCR Screening     Status: Abnormal   Collection Time: 02/12/19  8:40 PM  Result Value Ref Range Status   MRSA by PCR POSITIVE (A) NEGATIVE Final    Comment:        The GeneXpert MRSA Assay (FDA approved for NASAL specimens only), is one component of a comprehensive MRSA colonization surveillance  program. It is not intended to diagnose MRSA infection nor to guide or monitor treatment for MRSA infections. RESULT CALLED TO, READ BACK BY AND VERIFIED WITH: TATE,R ON 02/12/19 AT 2300 BY LOY,C Performed at [facility], [address], [place], [place] [address]   Respiratory Panel by PCR     Status: None   Collection Time: 02/13/19  9:11 AM  Result Value Ref Range Status   Adenovirus NOT DETECTED NOT DETECTED Final   Coronavirus 229E NOT DETECTED NOT DETECTED Final    Comment: (NOTE) The Coronavirus on the Respiratory Panel, DOES  NOT test for the novel  Coronavirus (2019 nCoV)    Coronavirus HKU1 NOT DETECTED NOT DETECTED Final   Coronavirus NL63 NOT DETECTED NOT DETECTED Final   Coronavirus OC43 NOT DETECTED NOT DETECTED Final   Metapneumovirus NOT DETECTED NOT DETECTED Final   Rhinovirus / Enterovirus NOT DETECTED NOT DETECTED Final   Influenza A NOT DETECTED NOT DETECTED Final   Influenza B NOT DETECTED NOT DETECTED Final   Parainfluenza Virus 1 NOT DETECTED NOT DETECTED Final   Parainfluenza Virus 2 NOT DETECTED NOT DETECTED Final   Parainfluenza Virus 3 NOT DETECTED NOT DETECTED Final   Parainfluenza Virus 4 NOT DETECTED NOT DETECTED Final   Respiratory Syncytial Virus NOT DETECTED NOT DETECTED Final   Bordetella pertussis NOT DETECTED NOT DETECTED Final   Chlamydophila pneumoniae NOT DETECTED NOT DETECTED Final   Mycoplasma pneumoniae NOT DETECTED NOT DETECTED Final    Comment: Performed at [facility], [address] [place], [place] [address]     Scheduled Meds: . aspirin EC  81 mg Oral Daily  . cephALEXin  500 mg Oral Q8H  . Chlorhexidine Gluconate Cloth  6 each Topical Q0600  . furosemide  40 mg Intravenous Q12H  . insulin aspart  0-5 Units Subcutaneous QHS  . insulin aspart  0-9 Units Subcutaneous TID WC  . insulin glargine  6 Units Subcutaneous Daily  . metoprolol tartrate  50 mg Oral BID  . mupirocin ointment  1 application Nasal BID  . PARoxetine  40 mg Oral Daily  . potassium chloride SA  20 mEq Oral Daily  . pregabalin  50 mg Oral Daily  . rosuvastatin  10 mg Oral q1800  . sodium chloride flush  3 mL Intravenous Q12H   Continuous Infusions: . sodium chloride    . sodium chloride 250 mL (02/16/19 1251)  . ferric gluconate (FERRLECIT/NULECIT) IV 125 mg (02/17/19 1150)    Procedures/Studies: Ct Abdomen Pelvis Wo Contrast  Result Date: 02/12/2019 CLINICAL DATA:  85 year old female with history of abdominal distension, fever and altered mental status. Increasing falls. EXAM: CT  ABDOMEN AND PELVIS WITHOUT CONTRAST TECHNIQUE: Multidetector CT imaging of the abdomen and pelvis was performed following the standard protocol without IV contrast. COMPARISON:  CT the abdomen and pelvis 11/10/2016. FINDINGS: Lower chest: Cardiomegaly. Atherosclerotic calcifications in the descending thoracic aorta as well as the left anterior descending, left circumflex and right coronary arteries. Calcifications of the aortic valve and mitral annulus. Moderate left pleural effusion. Areas of passive atelectasis are noted in the left lung base. Edema throughout the lower left chest wall and visualized portions of the left breast. Hepatobiliary: No definite suspicious cystic or solid hepatic lesions are confidently identified on today's noncontrast CT examination. Status post cholecystectomy. Pancreas: Pancreatic atrophy. No definite pancreatic mass or peripancreatic fluid or inflammatory changes. Spleen: Unremarkable. Adrenals/Urinary Tract: 2.3 cm low-attenuation lesion in the medial aspect of the upper pole of  the left kidney, incompletely characterized on today's noncontrast CT examination, but statistically likely a cyst. Unenhanced appearance of the right kidney is normal. No hydroureteronephrosis. Urinary bladder is unremarkable. Stomach/Bowel: Normal appearance of the stomach. No pathologic dilatation of small bowel or colon. Numerous colonic diverticulae are noted. Due to surrounding ascites, accurate assessment for focal inflammation indicative of acute diverticulitis is not possible on today's examination, but no focal inflammatory changes are confidently identified given these limitations. Appendix is not confidently identified may be surgically absent. Vascular/Lymphatic: Aortic atherosclerosis. No lymphadenopathy noted in the abdomen or pelvis. Reproductive: Status post hysterectomy. Ovaries are not confidently identified may be surgically absent or atrophic. Other: Small volume of ascites. No  pneumoperitoneum. Diffuse body wall edema. Musculoskeletal: Status post PLIF from L3-L5 with interbody grafts at L3-L4 and L4-L5. There are no aggressive appearing lytic or blastic lesions noted in the visualized portions of the skeleton. IMPRESSION: 1. Small volume of ascites, left pleural effusion and diffuse body wall edema; imaging findings suggestive of a state of anasarca. 2. No other acute findings are confidently identified in the abdomen or pelvis to account for the patient's symptoms. 3. Cardiomegaly. 4. Aortic atherosclerosis, in addition to least 3 vessel coronary artery disease. 5. There are calcifications of the aortic valve and mitral annulus. Echocardiographic correlation for evaluation of potential valvular dysfunction may be warranted if clinically indicated. 6. Colonic diverticulosis. Electronically Signed   By: Vinnie Langton M.D.   On: 02/12/2019 16:50   Dg Chest 1 View  Result Date: 02/12/2019 CLINICAL DATA:  Acute presentation with weakness. EXAM: CHEST  1 VIEW COMPARISON:  01/03/2019 FINDINGS: Artifact overlies the chest. Left Port-A-Cath tip in the SVC above the right atrium. Right lung remains clear. There is worsened infiltrate and volume loss in the left lower lobe. There is probably a left effusion as well. IMPRESSION: Worsening of disease on the left. Probable left lower lobe pneumonia and collapse. Suspected left effusion. Electronically Signed   By: Nelson Chimes M.D.   On: 02/12/2019 16:33   Dg Shoulder Right  Result Date: 02/11/2019 CLINICAL DATA:  Right shoulder pain after a fall tonight EXAM: RIGHT SHOULDER - 2+ VIEW COMPARISON:  None. FINDINGS: Mildly comminuted fracture of the humeral shaft with mild angulation. A sharp fracture fragment is present anteriorly and medially. Osteopenic appearance. Located glenohumeral and acromioclavicular joints. IMPRESSION: 1. Humeral shaft fracture as described on dedicated exam. 2. Located shoulder. Electronically Signed   By:  Monte Fantasia M.D.   On: 02/11/2019 05:02   Dg Humerus Right  Result Date: 02/11/2019 CLINICAL DATA:  Fall with right arm pain EXAM: RIGHT HUMERUS - 2+ VIEW COMPARISON:  None. FINDINGS: Mildly comminuted fracture of the mid humeral shaft with apex ventral and medial angulation. A sharp bone fragment is present medially. Located glenohumeral and acromioclavicular joints. IMPRESSION: Comminuted fracture of the humeral shaft with fracture likely extending to the surgical neck. A sharp bone fragment is present medially. Electronically Signed   By: Monte Fantasia M.D.   On: 02/11/2019 05:03    Orson Eva, DO  [facility] Pager [phone]  If 7PM-7AM, please contact night-coverage [email or web address] Password [email or web address] 02/17/2019, 5:05 PM   LOS: 5 days

## 2019-02-18 DIAGNOSIS — Z7189 Other specified counseling: Secondary | ICD-10-CM

## 2019-02-18 DIAGNOSIS — Z515 Encounter for palliative care: Secondary | ICD-10-CM

## 2019-02-18 DIAGNOSIS — I509 Heart failure, unspecified: Secondary | ICD-10-CM

## 2019-02-18 LAB — GLUCOSE, CAPILLARY
Glucose-Capillary: 192 mg/dL — ABNORMAL HIGH (ref 70–99)
Glucose-Capillary: 194 mg/dL — ABNORMAL HIGH (ref 70–99)
Glucose-Capillary: 164 mg/dL — ABNORMAL HIGH (ref 70–99)
Glucose-Capillary: 178 mg/dL — ABNORMAL HIGH (ref 70–99)

## 2019-02-18 LAB — BASIC METABOLIC PANEL
Anion gap: 9 (ref 5–15)
BUN: 60 mg/dL — ABNORMAL HIGH (ref 8–23)
CO2: 33 mmol/L — ABNORMAL HIGH (ref 22–32)
CREATININE: 2.26 mg/dL — AB (ref 0.44–1.00)
Calcium: 9 mg/dL (ref 8.9–10.3)
Chloride: 98 mmol/L (ref 98–111)
GFR calc Af Amer: 23 mL/min — ABNORMAL LOW (ref >60)
GFR calc non Af Amer: 19 mL/min — ABNORMAL LOW (ref >60)
Glucose, Bld: 205 mg/dL — ABNORMAL HIGH (ref 70–99)
Potassium: 3.8 mmol/L (ref 3.5–5.1)
SODIUM: 140 mmol/L (ref 135–145)

## 2019-02-18 MED ORDER — ACETAMINOPHEN 325 MG PO TABS
650.0000 mg | ORAL_TABLET | Freq: Three times a day (TID) | ORAL | Status: DC
  Administered 2019-02-18 – 2019-02-21 (×10): 650 mg via ORAL
  Filled 2019-02-18 (×10): qty 2

## 2019-02-18 MED ORDER — FUROSEMIDE 80 MG PO TABS
80.0000 mg | ORAL_TABLET | Freq: Two times a day (BID) | ORAL | Status: DC
  Administered 2019-02-18 – 2019-02-19 (×2): 80 mg via ORAL
  Filled 2019-02-18 (×2): qty 1

## 2019-02-18 MED ORDER — FENTANYL CITRATE (PF) 100 MCG/2ML IJ SOLN: 12.5000 ug | Freq: Once | INTRAMUSCULAR | Status: DC

## 2019-02-18 NOTE — Progress Notes (Signed)
[facility] ROUNDING NOTE   Subjective:   Brief history 85 year old lady with history of chronic kidney disease stage III/IV history of diabetes mellitus type 2 hypertension DJD history of stroke history of congestive heart failure ejection fraction of 45 to 50%.  History of breast cancer in remission.  Presented to the emergency room with volume overload and acute on chronic kidney disease appears that the creatinine is increased from 1.8-1.9 at baseline to 2.5.  She feels a little depressed this morning and tearful secondary to having pain in her right arm.  Blood pressure 135/80 pulse 90 temperature 98.7 O2 sats 95% room air  Sodium 140 potassium 3.8 chloride 98 CO2 33 6 BUN 60 creatinine 2.26 glucose 2 5 calcium 9.0  Aspirin 81 mg daily, Keflex 500 mg every 8 hours, Lasix 40 mg IV every 12 hours, Lopressor 37.5 mg twice daily, Crestor 10 mg daily, Paxil 40 mg daily, insulin sliding scale, glargine 6 units nightly, Lyrica 50 mg daily, albuterol every 6 hours as needed.  , no illicit 505 mg daily x8  Urine output 1 L weight 116.6 kg  CT scan abdomen and pelvis 02/12/2019 small amount of ascites left pleural effusion diffuse edema of abdominal wall cardiomegaly atherosclerosis calcifications aortic valve colonic diverticulosis. Chest x-ray 02/12/2019 worsening left pleural effusion.    Objective:  Vital signs in last 24 hours:  Temp:  [97.6 F (36.4 C)-98.7 F (37.1 C)] 98.7 F (37.1 C) (03/18 0525) Pulse Rate:  [77-104] 90 (03/18 0526) Resp:  [18-20] 20 (03/18 0525) BP: (135-136)/(61-86) 135/80 (03/18 0525) SpO2:  [81 %-100 %] 95 % (03/18 0526) Weight:  [116.6 kg] 116.6 kg (03/18 0526)  Weight change: -1.3 kg Filed Weights   02/16/19 0500 02/17/19 0609 02/18/19 0526  Weight: 118.3 kg 117.9 kg 116.6 kg    Intake/Output: I/O last 3 completed shifts: In: 1200 [P.O.:1200] Out: 2550 [Urine:2550]   Intake/Output this shift:  No intake/output data recorded. Frail  lady nondistressed CVS-tachycardic.  Coarse systolic murmur JVP does not appear elevated RS- anteriorly appeared to be clear with no wheezes or rales ABD- BS present soft non-distended GU.  Collecting clear yellow urine EXT-edema seems much improved.  Brace and sling   Basic Metabolic Panel: Recent Labs  Lab 02/14/19 0607 02/15/19 0606 02/16/19 0544 02/17/19 0425 02/18/19 0442  NA 140 139 141 141 140  K 4.2 4.3 3.8 3.9 3.8  CL 101 99 99 101 98  CO2 31 31 32 29 33*  GLUCOSE 202* 214* 178* 160* 205*  BUN 58* 60* 63* 63* 60*  CREATININE 2.41* 2.34* 2.46* 2.34* 2.26*  CALCIUM 9.0 8.9 9.2 9.2 9.0    Liver Function Tests: Recent Labs  Lab 02/12/19 1539  AST 21  ALT 12  ALKPHOS 29*  BILITOT 0.3  PROT 5.8*  ALBUMIN 2.8*   No results for input(s): LIPASE, AMYLASE in the last 168 hours. Recent Labs  Lab 02/13/19 0934  AMMONIA 14    CBC: Recent Labs  Lab 02/12/19 1539 02/16/19 0544  WBC 7.7 8.8  NEUTROABS 5.5  --   HGB 9.1* 9.7*  HCT 32.3* 35.0*  MCV 84.6 83.9  PLT 154 193    Cardiac Enzymes: Recent Labs  Lab 02/12/19 1539 02/13/19 0934  CKTOTAL  --  35*  TROPONINI 0.03*  --     BNP: Invalid input(s): POCBNP  CBG: Recent Labs  Lab 02/17/19 0743 02/17/19 1058 02/17/19 1737 02/17/19 2149 02/18/19 0733  GLUCAP 164* 235* 179* 189* 192*  Microbiology: Results for orders placed or performed during the hospital encounter of 02/12/19  MRSA PCR Screening     Status: Abnormal   Collection Time: 02/12/19  8:40 PM  Result Value Ref Range Status   MRSA by PCR POSITIVE (A) NEGATIVE Final    Comment:        The GeneXpert MRSA Assay (FDA approved for NASAL specimens only), is one component of a comprehensive MRSA colonization surveillance program. It is not intended to diagnose MRSA infection nor to guide or monitor treatment for MRSA infections. RESULT CALLED TO, READ BACK BY AND VERIFIED WITH: TATE,R ON 02/12/19 AT 2300 BY LOY,C Performed at  [facility], [address], [place], [place] [address]   Respiratory Panel by PCR     Status: None   Collection Time: 02/13/19  9:11 AM  Result Value Ref Range Status   Adenovirus NOT DETECTED NOT DETECTED Final   Coronavirus 229E NOT DETECTED NOT DETECTED Final    Comment: (NOTE) The Coronavirus on the Respiratory Panel, DOES NOT test for the novel  Coronavirus (2019 nCoV)    Coronavirus HKU1 NOT DETECTED NOT DETECTED Final   Coronavirus NL63 NOT DETECTED NOT DETECTED Final   Coronavirus OC43 NOT DETECTED NOT DETECTED Final   Metapneumovirus NOT DETECTED NOT DETECTED Final   Rhinovirus / Enterovirus NOT DETECTED NOT DETECTED Final   Influenza A NOT DETECTED NOT DETECTED Final   Influenza B NOT DETECTED NOT DETECTED Final   Parainfluenza Virus 1 NOT DETECTED NOT DETECTED Final   Parainfluenza Virus 2 NOT DETECTED NOT DETECTED Final   Parainfluenza Virus 3 NOT DETECTED NOT DETECTED Final   Parainfluenza Virus 4 NOT DETECTED NOT DETECTED Final   Respiratory Syncytial Virus NOT DETECTED NOT DETECTED Final   Bordetella pertussis NOT DETECTED NOT DETECTED Final   Chlamydophila pneumoniae NOT DETECTED NOT DETECTED Final   Mycoplasma pneumoniae NOT DETECTED NOT DETECTED Final    Comment: Performed at [facility], [address], [place], [place] [address]    Coagulation Studies: No results for input(s): LABPROT, INR in the last 72 hours.  Urinalysis: No results for input(s): COLORURINE, LABSPEC, PHURINE, GLUCOSEU, HGBUR, BILIRUBINUR, KETONESUR, PROTEINUR, UROBILINOGEN, NITRITE, LEUKOCYTESUR in the last 72 hours.  Invalid input(s): APPERANCEUR    Imaging: No results found.   Medications:   . sodium chloride    . sodium chloride 250 mL (02/16/19 1251)  . ferric gluconate (FERRLECIT/NULECIT) IV 125 mg (02/17/19 1150)   . aspirin EC  81 mg Oral Daily  . furosemide  40 mg Intravenous Q12H  . insulin aspart  0-15 Units Subcutaneous TID WC  . insulin aspart  0-5 Units  Subcutaneous QHS  . insulin glargine  8 Units Subcutaneous Daily  . metoprolol tartrate  50 mg Oral BID  . PARoxetine  40 mg Oral Daily  . potassium chloride SA  20 mEq Oral Daily  . pregabalin  50 mg Oral Daily  . rosuvastatin  10 mg Oral q1800  . sodium chloride flush  3 mL Intravenous Q12H   sodium chloride, sodium chloride, acetaminophen, albuterol, HYDROcodone-acetaminophen, ondansetron (ZOFRAN) IV, sodium chloride flush  Assessment/ Plan:   Acute on chronic kidney disease with no clear reversible etiology for worsening renal function she appears to be doing well with diuresis.  Her weight is down and her urine output is good.  She has stage IV chronic kidney disease at baseline is not a dialysis candidate.  Agree with avoiding nephrotoxins hypertension maintaining euvolemia.  Patient appears  to be  doing well this morning.  She is responding well to IV diuresis.  Will transition to oral Lasix 80 mg twice daily.  As an outpatient She was taking torsemide 40 mg daily.   anemia has iron deficiency would continue to replete with IV iron.  Volume/hypertension.  Appears to be doing well blood pressure appropriate continue with diuresis.  Atrial fibrillation.  Rate controlled not candidate for anticoagulation secondary to falls  Diabetes mellitus as per primary team  Bones PTH 42   LOS: [place] $RemoveBef'@TODAY'lWVrzOcMhZ$ $Remo'@10'tSpQE$ :19 AM

## 2019-02-18 NOTE — Consult Note (Signed)
Consultation Note Date: 02/18/2019   Patient Name: [name]  DOB: February 08, 1933  MRN: [id number]  Age / Sex: 85 y.o., female  PCP: Redmond School, MD Referring Physician: Orson Eva, MD  Reason for Consultation: Establishing goals of care  HPI/Patient Profile: 85 y.o. female  with past medical history of CHF, CKD stage 4, aortic stenosis, atrial flutter (no anticoagulation d/t falls), diabetes mellitus, IBS, depression, memory changes, recent right humerus fracture admitted on 02/12/2019 with altered mental status and swelling in her abdomen and extremities r/t acute CHF exacerbation with acute on chronic renal failure.   Clinical Assessment and Goals of Care: I met today with [name]. She is alert and able to tell me she is at [facility], tell me her name, and tells me about having pain in her arm and asks about hydrocodone and fentanyl giving her relief. However, in our conversation she is tearful and difficult to direct to a conversation. She is somewhat distressed with pain and appears anxious. I left her to work with PT.   I spoke today with [name]'s HCPOA/grandson, [name], and Lauren wife, [name], via telephone. They share with me the progression of [name]'s decline in functional status. She was living at home alone up until February 2020 (although [name] says she probably should not have been at home alone) but was having increasing falls, noted to be sleeping and lying in bed all day, and the cleanliness of her house was significantly declining. [name] says her house is inhabitable. This is vastly different from her baseline as [name] says she was always very clean and put together. [name] notes times when he would ask about her taking her medications and she would not be able to remember and would get frustrated and even angry with these questions at times.   She has also had many life changes  recently with her son she was very close with dying in February and being moved out of her home to SNF. She lost a daughter many years ago (Lauren mother - [name] as her own son) and 1 living son who she has an estranged relationship with. They feel she has a lot of life stressors and this makes her high risk for increased anxiety/depression. [name] questions [name]'s will to live.   [name] favors a more comfort approach but feels [name] is not ready for this and not ready to lose her. I will meet with [name] at their request tomorrow 9am for further conversations and [place].   Primary Decision Maker HCPOA grandson [name]    SUMMARY OF RECOMMENDATIONS   - Meet tomorrow to discuss where to go from here and [place]  Code Status/Advance Care Planning:  DNR   Symptom Management:   Pain r/t recent humerus fracture and chronic back pain: Tylenol 650 mg po TID. Hydrocodone-acetaminophen 10-325 mg 1 tablet BID prn (consider increasing as needed).   Palliative Prophylaxis:   Bowel Regimen, Delirium Protocol, Frequent Pain Assessment, Oral Care and Turn Reposition  Psycho-social/Spiritual:  Desire for further Chaplaincy support:no  Additional Recommendations: Caregiving  Support/Resources and Education on Hospice  Prognosis:   < 6 months  Discharge Planning: To Be Determined      Primary Diagnoses: Present on Admission: . Atrial fibrillation ([place]) . Acute kidney injury superimposed on chronic kidney disease ([place]) . Acute lower UTI (urinary tract infection) . Essential hypertension . Fibromyalgia   I have reviewed the medical record, interviewed the patient and family, and examined the patient. The following aspects are pertinent.  Past Medical History:  Diagnosis Date  . Anemia   . Aortic stenosis    Mild  . Arthritis   . Asthma   . Atrial flutter ([place]) 2002  . B12 deficiency 06/20/2015  . B12 deficiency 06/20/2015  . Breast carcinoma  ([place])   . Cerebrovascular disease    with a 84% LICA; repeat study in 10/2009-no obstructive disease; modest ASVD  . Chronic kidney disease    Creatinine-1.5 in 2010 and 2.5-3 in 2011; 1.5-10/2010;  Klebsiella UTI-10/2010. urine protein 36 mg/dl, mildly elevated  . Decreased bone density   . Depression   . Diabetes mellitus   . Diabetic retinopathy   . DJD (degenerative joint disease) 11/14/2011  . Falls infrequently 01/2010   fracture of pelvis and left humerus  . Fibromyalgia   . Headaches, cluster   . Hyperlipidemia   . Hypertension   . IBS (irritable bowel syndrome)    diverticulosis, gastroesophageal reflux disease  . Lower back pain   . Obesity 11/14/2011  . Spinal stenosis    Social History   Socioeconomic History  . Marital status: Widowed    Spouse name: Not on file  . Number of children: 2  . Years of education: Not on file  . Highest education level: Not on file  Occupational History  . Occupation: On disability    Employer: RETIRED  Social Needs  . Financial resource strain: Not on file  . Food insecurity:    Worry: Not on file    Inability: Not on file  . Transportation needs:    Medical: Not on file    Non-medical: Not on file  Tobacco Use  . Smoking status: Former Smoker    Packs/day: 1.00    Years: 30.00    Pack years: 30.00    Types: Cigarettes    Last attempt to quit: 1982    Years since quitting: 38.2  . Smokeless tobacco: Never Used  Substance and Sexual Activity  . Alcohol use: No  . Drug use: No  . Sexual activity: Not on file  Lifestyle  . Physical activity:    Days per week: Not on file    Minutes per session: Not on file  . Stress: Not on file  Relationships  . Social connections:    Talks on phone: Not on file    Gets together: Not on file    Attends religious service: Not on file    Active member of club or organization: Not on file    Attends meetings of clubs or organizations: Not on file    Relationship status: Not on file   Other Topics Concern  . Not on file  Social History Narrative   Lives alone   Family History  Problem Relation Age of Onset  . Alzheimer's disease Mother   . Heart attack Father   . Aneurysm Sister   . Coronary artery disease Brother    Scheduled Meds: . aspirin EC  81 mg Oral Daily  .  furosemide  80 mg Oral BID  . insulin aspart  0-15 Units Subcutaneous TID WC  . insulin aspart  0-5 Units Subcutaneous QHS  . insulin glargine  8 Units Subcutaneous Daily  . metoprolol tartrate  50 mg Oral BID  . PARoxetine  40 mg Oral Daily  . potassium chloride SA  20 mEq Oral Daily  . pregabalin  50 mg Oral Daily  . rosuvastatin  10 mg Oral q1800  . sodium chloride flush  3 mL Intravenous Q12H   Continuous Infusions: . sodium chloride    . sodium chloride 250 mL (02/16/19 1251)  . ferric gluconate (FERRLECIT/NULECIT) IV 125 mg (02/17/19 1150)   PRN Meds:.sodium chloride, sodium chloride, acetaminophen, albuterol, HYDROcodone-acetaminophen, ondansetron (ZOFRAN) IV, sodium chloride flush Allergies  Allergen Reactions  . Mold Extract [Trichophyton Mentagrophyte] Other (See Comments)    Reaction:  Itchy, watery eyes/sneezing   . Morphine And Related Other (See Comments)    Reaction:  Drowsiness. "I didn't wake up for almost 1 week" after taking morphine.   . Niaspan [Niacin] Other (See Comments)    Reaction:  Flushing   . Pollen Extract Other (See Comments)    Reaction:  Itchy, watery eyes/sneezing    Review of Systems  Unable to perform ROS: Mental status change  Constitutional: Positive for activity change, appetite change and fatigue.  Neurological: Positive for weakness.    Physical Exam Vitals signs and nursing note reviewed.  Constitutional:      Appearance: She is ill-appearing.  Cardiovascular:     Rate and Rhythm: Normal rate.  Pulmonary:     Effort: Pulmonary effort is normal. No tachypnea, accessory muscle usage or respiratory distress.  Abdominal:     Palpations:  Abdomen is soft.  Neurological:     Mental Status: She is alert.     Comments: Confused at times     Vital Signs: BP 135/80 (BP Location: Left Arm)   Pulse 90   Temp 98.7 F (37.1 C) (Oral)   Resp 20   Ht '5\' 11"'$  (1.803 m)   Wt 116.6 kg   SpO2 95%   BMI 35.85 kg/m  Pain Scale: 0-10 POSS *See Group Information*: 1-Acceptable,Awake and alert Pain Score: 0-No pain   SpO2: SpO2: 95 % O2 Device:SpO2: 95 % O2 Flow Rate: .O2 Flow Rate (L/min): 0 L/min  IO: Intake/output summary:   Intake/Output Summary (Last 24 hours) at 02/18/2019 1039 Last data filed at 02/18/2019 0700 Gross per 24 hour  Intake 840 ml  Output 1200 ml  Net -360 ml    LBM: Last BM Date: 02/17/19 Baseline Weight: Weight: 111.5 kg Most recent weight: Weight: 116.6 kg     Palliative Assessment/Data: 30%   Flowsheet Rows     Most Recent Value  Intake Tab  Referral Department  Hospitalist  Unit at Time of Referral  Med/Surg Unit  Date Notified  02/17/19  Palliative Care Type  New Palliative care  Reason for referral  Clarify Goals of Care  Date of Admission  02/12/19  # of days IP prior to Palliative referral  5  Clinical Assessment  Psychosocial & Spiritual Assessment  Palliative Care Outcomes      Time In: 1030 Time Out: 1130 Time Total: 60 min Greater than 50%  of this time was spent counseling and coordinating care related to the above assessment and plan.  Signed by: Vinie Sill, NP Palliative Medicine Team Pager # [phone] (M-F 8a-5p) Team Phone # [phone] (Nights/Weekends)

## 2019-02-18 NOTE — Progress Notes (Signed)
IV fentanyl not given. Discontinue order clarified with Vinie Sill, NP. Scheduled tylenol ordered for pain relief. Will monitor effectiveness. Melony Overly, RN

## 2019-02-18 NOTE — Progress Notes (Signed)
PROGRESS NOTE  [name] CVE:[id number] DOB: 11/12/1933 DOA: 02/12/2019 PCP: Redmond School, MD   Brief History: 85 year old female with a history of right-sided inflammatory breast cancer in remission, CKD stage IV, atrial fibrillation, stroke, diabetes mellitus with retinopathy, hypertension, hyperlipidemia, B12 deficiency, frequent falls presenting from [place] with reported fever and altered mental status. Unfortunately, the patient is a poor historian,and attempts to call the family were unsuccessfulinitally. Nevertheless, the patient was sent to the emergency department for the above complaints. The patient herself stated that she was told that she had confusion and fevers. During evaluation, the patient was noted to be fluid overloaded with anasarca. She was afebrile hemodynamically stable saturating 92-95% room air. WBC was 7.7. She was noted to have serum creatinine 2.57.She was started on intravenous furosemide. Although the exact history is not completely clear, it is also been noted in the medical record that the patient has had worsening lower extremity edema and abdominal wall edema at least for the past week. The patient had a recentadmitto the hospital from 01/03/2019 through 01/08/2019 for increasing generalized weakness and frequent falls. She was diagnosed with a UTI. She was sent to a skilled nursing facility at that time. Since that discharge, the patient has been transition from furosemide to torsemide, but it is unclear exactly when this transition occurred.  Assessment/Plan: AcutesystolicCHF/anasarca -Continue IV furosemide>>po furosemide -Daily weights--NEG 5.4 kg (123.3 kg--->116.6kg) -Accurate I's and O's--NEG 8 .3L -01/04/2019 echo EF 45-50%, no WMA, elevated RV pressure 58.0. Dilated IVC;moderate TR, mild decreased RV function -Personally view chest x-ray--increased interstitial markings, left pleural effusion -suspect  degree of Cor Pulmonale, possibly due to undiagnosed OSA -transition to oral furosemide per nephrology on 02/18/19  Acute on chronic renal failure--CKD stage IV -Baseline creatinine 1.6-1.8 -Presented with serum creatinine 2.57 -No hydronephrosis noted on CT abdomen and pelvis -Urinalysis bland -renal consult appreciated>>>transition to oral furosemide 3/18 -will need to tolerate worse renal function for improved pulm function  Chronic atrial fibrillation -CHADSVASc = 7 (CHF, HTN, Age, Female, DM, CAD) -Not on anticoagulation secondary to history of frequent falls -Continue metoprolol -Personally reviewed EKG--atrial fibrillation, no ST-T wave changes -continue ASA 81 mg daily -increased metoprolol to 50 mg bid for better rate control  Acute metabolic encephalopathy -no fever noted since admission -B12--295 -TSH--3.249 -Free T4--0.94 -ammonia--14 -grandson (POA) states pt has had gradual cognitive decline over last 4-5 months -A&Ox2 during my exam--probably her baseline -due to CHF, acute on chronic renal failure, hospital delirium  Diabetes mellitus type 2, uncontrolled with hyperglycemia -Holding glipizide -Increase Lantus to 8 units -change to moderate SSI -NovoLog sliding scale -Hemoglobin A1c--8.9 -Holding Januvia  Essential hypertension -Restartedmetoprolol tartrate  Hyperlipidemia -Restart Crestor  Fibromyalgia -Restart Lyrica and Paxil  Recent right humerus fracture -Follow-up orthopedics, Dr. Aline Brochure  Goals of Care -DNR -anticipate continued gradual functional and cognitive -long discussion with grandson (HPOA)-->currently plan to d/c back to SNF; treat the treatable for now -difficult family dynamics -palliative consulted    Disposition Plan:SNF 3/19 if stable Family Communication:updated grandson (POA) at bedside 3/17  Consultants:palliative medicine  Code Status: DNR  DVT Prophylaxis: SCDs   Procedures: As  Listed in Progress Note Above  Antibiotics: Cephalexin 3/12>>>3/17     Subjective: Pt is more lucid today.  Patient denies fevers, chills, headache, chest pain, dyspnea, nausea, vomiting, diarrhea, abdominal pain, dysuria.   Objective: Vitals:   02/17/19 2122 02/18/19 0525 02/18/19 0526 02/18/19 1500  BP: 136/86  135/80  (!) 109/58  Pulse: 96 77 90 74  Resp: $Remo'20 20  17  'CAISz$ Temp: 98 F (36.7 C) 98.7 F (37.1 C)  98.5 F (36.9 C)  TempSrc: Oral Oral  Oral  SpO2: 100% (!) 81% 95% 96%  Weight:   116.6 kg   Height:        Intake/Output Summary (Last 24 hours) at 02/18/2019 1731 Last data filed at 02/18/2019 1300 Gross per 24 hour  Intake 840 ml  Output 500 ml  Net 340 ml   Weight change: -1.3 kg Exam:   General:  Pt is alert, follows commands appropriately, not in acute distress  HEENT: No icterus, No thrush, No neck mass, [place]/AT  Cardiovascular: RRR, S1/S2, no rubs, no gallops  Respiratory: CTA bilaterally, no wheezing, no crackles, no rhonchi  Abdomen: Soft/+BS, non tender, non distended, no guarding  Extremities: No edema, No lymphangitis, No petechiae, No rashes, no synovitis   Data Reviewed: I have personally reviewed following labs and imaging studies Basic Metabolic Panel: Recent Labs  Lab 02/14/19 0607 02/15/19 0606 02/16/19 0544 02/17/19 0425 02/18/19 0442  NA 140 139 141 141 140  K 4.2 4.3 3.8 3.9 3.8  CL 101 99 99 101 98  CO2 31 31 32 29 33*  GLUCOSE 202* 214* 178* 160* 205*  BUN 58* 60* 63* 63* 60*  CREATININE 2.41* 2.34* 2.46* 2.34* 2.26*  CALCIUM 9.0 8.9 9.2 9.2 9.0   Liver Function Tests: Recent Labs  Lab 02/12/19 1539  AST 21  ALT 12  ALKPHOS 29*  BILITOT 0.3  PROT 5.8*  ALBUMIN 2.8*   No results for input(s): LIPASE, AMYLASE in the last 168 hours. Recent Labs  Lab 02/13/19 0934  AMMONIA 14   Coagulation Profile: No results for input(s): INR, PROTIME in the last 168 hours. CBC: Recent Labs  Lab 02/12/19 1539 02/16/19  0544  WBC 7.7 8.8  NEUTROABS 5.5  --   HGB 9.1* 9.7*  HCT 32.3* 35.0*  MCV 84.6 83.9  PLT 154 193   Cardiac Enzymes: Recent Labs  Lab 02/12/19 1539 02/13/19 0934  CKTOTAL  --  35*  TROPONINI 0.03*  --    BNP: Invalid input(s): POCBNP CBG: Recent Labs  Lab 02/17/19 1737 02/17/19 2149 02/18/19 0733 02/18/19 1108 02/18/19 1643  GLUCAP 179* 189* 192* 194* 164*   HbA1C: No results for input(s): HGBA1C in the last 72 hours. Urine analysis:    Component Value Date/Time   COLORURINE YELLOW 02/12/2019 [place] 02/12/2019 1734   LABSPEC 1.011 02/12/2019 1734   PHURINE 5.0 02/12/2019 1734   GLUCOSEU NEGATIVE 02/12/2019 1734   GLUCOSEU neg 04/07/2010   HGBUR NEGATIVE 02/12/2019 1734   BILIRUBINUR NEGATIVE 02/12/2019 1734   KETONESUR NEGATIVE 02/12/2019 1734   PROTEINUR NEGATIVE 02/12/2019 1734   UROBILINOGEN 0.2 09/27/2014 1630   NITRITE NEGATIVE 02/12/2019 1734   LEUKOCYTESUR NEGATIVE 02/12/2019 1734   Sepsis Labs: $RemoveBefo'@LABRCNTIP'NuzdiUyXEaa$ (procalcitonin:4,lacticidven:4) ) Recent Results (from the past 240 hour(s))  MRSA PCR Screening     Status: Abnormal   Collection Time: 02/12/19  8:40 PM  Result Value Ref Range Status   MRSA by PCR POSITIVE (A) NEGATIVE Final    Comment:        The GeneXpert MRSA Assay (FDA approved for NASAL specimens only), is one component of a comprehensive MRSA colonization surveillance program. It is not intended to diagnose MRSA infection nor to guide or monitor treatment for MRSA infections. RESULT CALLED TO, READ BACK BY AND VERIFIED  WITH: TATE,R ON 02/12/19 AT 2300 BY LOY,C Performed at [facility], [address], [place], [place] [address]   Respiratory Panel by PCR     Status: None   Collection Time: 02/13/19  9:11 AM  Result Value Ref Range Status   Adenovirus NOT DETECTED NOT DETECTED Final   Coronavirus 229E NOT DETECTED NOT DETECTED Final    Comment: (NOTE) The Coronavirus on the Respiratory Panel, DOES NOT test  for the novel  Coronavirus (2019 nCoV)    Coronavirus HKU1 NOT DETECTED NOT DETECTED Final   Coronavirus NL63 NOT DETECTED NOT DETECTED Final   Coronavirus OC43 NOT DETECTED NOT DETECTED Final   Metapneumovirus NOT DETECTED NOT DETECTED Final   Rhinovirus / Enterovirus NOT DETECTED NOT DETECTED Final   Influenza A NOT DETECTED NOT DETECTED Final   Influenza B NOT DETECTED NOT DETECTED Final   Parainfluenza Virus 1 NOT DETECTED NOT DETECTED Final   Parainfluenza Virus 2 NOT DETECTED NOT DETECTED Final   Parainfluenza Virus 3 NOT DETECTED NOT DETECTED Final   Parainfluenza Virus 4 NOT DETECTED NOT DETECTED Final   Respiratory Syncytial Virus NOT DETECTED NOT DETECTED Final   Bordetella pertussis NOT DETECTED NOT DETECTED Final   Chlamydophila pneumoniae NOT DETECTED NOT DETECTED Final   Mycoplasma pneumoniae NOT DETECTED NOT DETECTED Final    Comment: Performed at [facility], [place]. [address], [place], [place] [address]     Scheduled Meds: . acetaminophen  650 mg Oral TID  . aspirin EC  81 mg Oral Daily  . furosemide  80 mg Oral BID  . insulin aspart  0-15 Units Subcutaneous TID WC  . insulin aspart  0-5 Units Subcutaneous QHS  . insulin glargine  8 Units Subcutaneous Daily  . metoprolol tartrate  50 mg Oral BID  . PARoxetine  40 mg Oral Daily  . potassium chloride SA  20 mEq Oral Daily  . pregabalin  50 mg Oral Daily  . rosuvastatin  10 mg Oral q1800  . sodium chloride flush  3 mL Intravenous Q12H   Continuous Infusions: . sodium chloride    . sodium chloride 250 mL (02/16/19 1251)  . ferric gluconate (FERRLECIT/NULECIT) IV 125 mg (02/18/19 1210)    Procedures/Studies: Ct Abdomen Pelvis Wo Contrast  Result Date: 02/12/2019 CLINICAL DATA:  85 year old female with history of abdominal distension, fever and altered mental status. Increasing falls. EXAM: CT ABDOMEN AND PELVIS WITHOUT CONTRAST TECHNIQUE: Multidetector CT imaging of the abdomen and pelvis was performed  following the standard protocol without IV contrast. COMPARISON:  CT the abdomen and pelvis 11/10/2016. FINDINGS: Lower chest: Cardiomegaly. Atherosclerotic calcifications in the descending thoracic aorta as well as the left anterior descending, left circumflex and right coronary arteries. Calcifications of the aortic valve and mitral annulus. Moderate left pleural effusion. Areas of passive atelectasis are noted in the left lung base. Edema throughout the lower left chest wall and visualized portions of the left breast. Hepatobiliary: No definite suspicious cystic or solid hepatic lesions are confidently identified on today's noncontrast CT examination. Status post cholecystectomy. Pancreas: Pancreatic atrophy. No definite pancreatic mass or peripancreatic fluid or inflammatory changes. Spleen: Unremarkable. Adrenals/Urinary Tract: 2.3 cm low-attenuation lesion in the medial aspect of the upper pole of the left kidney, incompletely characterized on today's noncontrast CT examination, but statistically likely a cyst. Unenhanced appearance of the right kidney is normal. No hydroureteronephrosis. Urinary bladder is unremarkable. Stomach/Bowel: Normal appearance of the stomach. No pathologic dilatation of small bowel or colon. Numerous colonic  diverticulae are noted. Due to surrounding ascites, accurate assessment for focal inflammation indicative of acute diverticulitis is not possible on today's examination, but no focal inflammatory changes are confidently identified given these limitations. Appendix is not confidently identified may be surgically absent. Vascular/Lymphatic: Aortic atherosclerosis. No lymphadenopathy noted in the abdomen or pelvis. Reproductive: Status post hysterectomy. Ovaries are not confidently identified may be surgically absent or atrophic. Other: Small volume of ascites. No pneumoperitoneum. Diffuse body wall edema. Musculoskeletal: Status post PLIF from L3-L5 with interbody grafts at L3-L4  and L4-L5. There are no aggressive appearing lytic or blastic lesions noted in the visualized portions of the skeleton. IMPRESSION: 1. Small volume of ascites, left pleural effusion and diffuse body wall edema; imaging findings suggestive of a state of anasarca. 2. No other acute findings are confidently identified in the abdomen or pelvis to account for the patient's symptoms. 3. Cardiomegaly. 4. Aortic atherosclerosis, in addition to least 3 vessel coronary artery disease. 5. There are calcifications of the aortic valve and mitral annulus. Echocardiographic correlation for evaluation of potential valvular dysfunction may be warranted if clinically indicated. 6. Colonic diverticulosis. Electronically Signed   By: Vinnie Langton M.D.   On: 02/12/2019 16:50   Dg Chest 1 View  Result Date: 02/12/2019 CLINICAL DATA:  Acute presentation with weakness. EXAM: CHEST  1 VIEW COMPARISON:  01/03/2019 FINDINGS: Artifact overlies the chest. Left Port-A-Cath tip in the SVC above the right atrium. Right lung remains clear. There is worsened infiltrate and volume loss in the left lower lobe. There is probably a left effusion as well. IMPRESSION: Worsening of disease on the left. Probable left lower lobe pneumonia and collapse. Suspected left effusion. Electronically Signed   By: Nelson Chimes M.D.   On: 02/12/2019 16:33   Dg Shoulder Right  Result Date: 02/11/2019 CLINICAL DATA:  Right shoulder pain after a fall tonight EXAM: RIGHT SHOULDER - 2+ VIEW COMPARISON:  None. FINDINGS: Mildly comminuted fracture of the humeral shaft with mild angulation. A sharp fracture fragment is present anteriorly and medially. Osteopenic appearance. Located glenohumeral and acromioclavicular joints. IMPRESSION: 1. Humeral shaft fracture as described on dedicated exam. 2. Located shoulder. Electronically Signed   By: Monte Fantasia M.D.   On: 02/11/2019 05:02   Dg Humerus Right  Result Date: 02/11/2019 CLINICAL DATA:  Fall with right  arm pain EXAM: RIGHT HUMERUS - 2+ VIEW COMPARISON:  None. FINDINGS: Mildly comminuted fracture of the mid humeral shaft with apex ventral and medial angulation. A sharp bone fragment is present medially. Located glenohumeral and acromioclavicular joints. IMPRESSION: Comminuted fracture of the humeral shaft with fracture likely extending to the surgical neck. A sharp bone fragment is present medially. Electronically Signed   By: Monte Fantasia M.D.   On: 02/11/2019 05:03    Orson Eva, DO  [facility] Pager [phone]  If 7PM-7AM, please contact night-coverage [email or web address] Password [email or web address] 02/18/2019, 5:31 PM   LOS: 6 days

## 2019-02-18 NOTE — Progress Notes (Signed)
Physical Therapy Treatment Patient Details Name: [name] MRN: [id number] DOB: 12/12/1932 Today's Date: 02/18/2019    History of Present Illness Patient is an 85 year old female admitted 02/12/2019 with diagnosis of acute CHF, anasarca. PMH: a fib, HTN, aortic stenosis, AKI, CKD, fibromyalgia, fall infrequently, obesity, cirrhosis, Rt sided breast cancer in remission, stroke, recent right humeral fracture. From [facility].    PT Comments    Patient limited for bed mobility and attempting sit to stands due to c/o severe pain LUE with movement, tolerated standing for up to 10-20 seconds before having to sit down, demonstrated fair/good sitting balance while performing BLE ROM/strengthening exercises and able to use LUE and BLE to help reposition self in bed with bed in head down position.  Patient will benefit from continued physical therapy in hospital and recommended venue below to increase strength, balance, endurance for safe ADLs and gait.    Follow Up Recommendations  SNF;Supervision/Assistance - 24 hour     Equipment Recommendations  None recommended by PT    Recommendations for Other Services       Precautions / Restrictions Precautions Precautions: Shoulder Type of Shoulder Precautions: recent right humeral fracture - Dr Aline Brochure has been asked for a consult Shoulder Interventions: Shoulder sling/immobilizer Precaution Comments: LUE in splint Restrictions Weight Bearing Restrictions: Yes RUE Weight Bearing: Non weight bearing    Mobility  Bed Mobility Overal bed mobility: Needs Assistance Bed Mobility: Supine to Sit;Sit to Supine     Supine to sit: Mod assist Sit to supine: Max assist   General bed mobility comments: labored movement, limited secondary to RUE pain  Transfers Overall transfer level: Needs assistance Equipment used: Rolling walker (2 wheeled)(using LUE) Transfers: Sit to/from Stand Sit to Stand: Max assist         General transfer  comment: stood for up to 10-12 seconds using LUE to hold onto RW, but had to sit due to c/o severe RUE pain  Ambulation/Gait                 Stairs             Wheelchair Mobility    Modified Rankin (Stroke Patients Only)       Balance Overall balance assessment: Needs assistance Sitting-balance support: Feet supported;Single extremity supported Sitting balance-Leahy Scale: Fair Sitting balance - Comments: fair unsupported, fair/good with singe extremity supported       Standing balance comment: fair/poor using RW supported with LUE                            Cognition Arousal/Alertness: Awake/alert Behavior During Therapy: WFL for tasks assessed/performed Overall Cognitive Status: Within Functional Limits for tasks assessed                                        Exercises General Exercises - Lower Extremity Long Arc Quad: Seated;AROM;Strengthening;Both;10 reps Hip Flexion/Marching: Strengthening;Seated;AROM;Both;10 reps Toe Raises: Seated;Strengthening;AROM;Both;10 reps Heel Raises: Seated;AROM;Both;10 reps;Strengthening    General Comments        Pertinent Vitals/Pain Pain Assessment: Faces Faces Pain Scale: Hurts whole lot Pain Location: RUE Pain Descriptors / Indicators: Sharp;Sore;Grimacing;Guarding;Crying Pain Intervention(s): Limited activity within patient's tolerance;Monitored during session;Premedicated before session    Home Living  Prior Function            PT Goals (current goals can now be found in the care plan section) Acute Rehab PT Goals Patient Stated Goal: go home; not return to [facility] PT Goal Formulation: With patient Time For Goal Achievement: 02/27/19 Potential to Achieve Goals: Fair Progress towards PT goals: Progressing toward goals    Frequency    Min 3X/week      PT Plan      Co-evaluation              AM-PAC PT "6 Clicks" Mobility    Outcome Measure  Help needed turning from your back to your side while in a flat bed without using bedrails?: A Lot Help needed moving from lying on your back to sitting on the side of a flat bed without using bedrails?: A Lot Help needed moving to and from a bed to a chair (including a wheelchair)?: Total Help needed standing up from a chair using your arms (e.g., wheelchair or bedside chair)?: A Lot Help needed to walk in hospital room?: Total Help needed climbing 3-5 steps with a railing? : Total 6 Click Score: 9    End of Session Equipment Utilized During Treatment: Gait belt Activity Tolerance: Patient tolerated treatment well;Patient limited by fatigue;Patient limited by pain Patient left: in bed;with call bell/phone within reach Nurse Communication: Mobility status PT Visit Diagnosis: Muscle weakness (generalized) (M62.81);Other abnormalities of gait and mobility (R26.89);Unsteadiness on feet (R26.81)     Time: 2863-8177 PT Time Calculation (min) (ACUTE ONLY): 37 min  Charges:  $Therapeutic Exercise: 8-22 mins $Therapeutic Activity: 23-37 mins                     3:26 PM, 02/18/19 [name], MPT Physical Therapist with [facility] [phone] office [phone] mobile phone

## 2019-02-19 DIAGNOSIS — Z7189 Other specified counseling: Secondary | ICD-10-CM

## 2019-02-19 DIAGNOSIS — Z515 Encounter for palliative care: Secondary | ICD-10-CM

## 2019-02-19 LAB — BASIC METABOLIC PANEL
ANION GAP: 9 (ref 5–15)
BUN: 58 mg/dL — ABNORMAL HIGH (ref 8–23)
CO2: 31 mmol/L (ref 22–32)
Calcium: 8.9 mg/dL (ref 8.9–10.3)
Chloride: 101 mmol/L (ref 98–111)
Creatinine, Ser: 2.12 mg/dL — ABNORMAL HIGH (ref 0.44–1.00)
GFR calc Af Amer: 24 mL/min — ABNORMAL LOW (ref >60)
GFR calc non Af Amer: 21 mL/min — ABNORMAL LOW (ref >60)
Glucose, Bld: 210 mg/dL — ABNORMAL HIGH (ref 70–99)
POTASSIUM: 3.5 mmol/L (ref 3.5–5.1)
Sodium: 141 mmol/L (ref 135–145)

## 2019-02-19 LAB — GLUCOSE, CAPILLARY
GLUCOSE-CAPILLARY: 241 mg/dL — AB (ref 70–99)
GLUCOSE-CAPILLARY: 159 mg/dL — AB (ref 70–99)
Glucose-Capillary: 206 mg/dL — ABNORMAL HIGH (ref 70–99)
Glucose-Capillary: 124 mg/dL — ABNORMAL HIGH (ref 70–99)

## 2019-02-19 MED ORDER — FUROSEMIDE 40 MG PO TABS
40.0000 mg | ORAL_TABLET | Freq: Two times a day (BID) | ORAL | Status: DC
  Administered 2019-02-19 – 2019-02-21 (×4): 40 mg via ORAL
  Filled 2019-02-19 (×4): qty 1

## 2019-02-19 NOTE — Progress Notes (Addendum)
Palliative:  I met today with [name]'s grandson and his wife, Angelica Chessman and [place], today. We had a long discussion regarding GOC and options of where to go from here. [name] has had significant decline over the past few months. Decline in functional status, memory, multiple falls, CHF and CKD as well. They are interested in options to focus on QOL and taking the best care of [name] moving forward.   We discussed SNF, home with home health, home with hospice options. Discussed that she is not eligible for hospice facility at this time. They tell me that they spent much time last night discussing the option of bringing her to their home.We discussed in detail her care needs and what this would entail. They would be looking to hiring outside help to assist them. We discussed the role and goals of hospice and how they could assist at home. After long discussion they would like to pursue home with hospice. They understand that hospice will assist with care at home and as she further declines will assist with keeping her comfortable at the end of her life while avoiding rehospitalization. They would also consider hospice facility if needed down the road.   Late entry: I met with [name] also but her family had left the bedside. I discussed with [name] about going to her grandson's home for her care and she agrees she would like to do this. I explained that to make this happen we were thinking of having the help of hospice. She looked a little confused and then tells me that they did not speak about this. I explained that I spoke with [name] and [name] about hospice and they thought it would be a good idea. She immediately said "oh, I think that will be good." I asked if she had any concerns about this plan and she tells me "no." She did not become anxious and actually seems less anxious than yesterday. I updated [name] on this conversation. Emotional support provided.   Exam: Alert, oriented (this  waxes and wanes). No distress.   Plan: - Home with hospice.   [id number], [id number] 257 min  Vinie Sill, NP Palliative Medicine Team Pager # [phone] (M-F 8a-5p) Team Phone # [phone] (Nights/Weekends)

## 2019-02-19 NOTE — Progress Notes (Signed)
PROGRESS NOTE  [name] NWG:[id number] DOB: 07-26-1933 DOA: 02/12/2019 PCP: Elfredia Nevins, MD   Brief History: 85 year old female with a history of right-sided inflammatory breast cancer in remission, CKD stage IV, atrial fibrillation, stroke, diabetes mellitus with retinopathy, hypertension, hyperlipidemia, B12 deficiency, frequent falls presenting from [facility] facility with reported fever and altered mental status. Unfortunately, the patient is a poor historian,and attempts to call the family were unsuccessfulinitally. Nevertheless, the patient was sent to the emergency department for the above complaints. The patient herself stated that she was told that she had confusion and fevers. During evaluation, the patient was noted to be fluid overloaded with anasarca. She was afebrile hemodynamically stable saturating 92-95% room air. WBC was 7.7. She was noted to have serum creatinine 2.57.She was started on intravenous furosemide. Although the exact history is not completely clear, it is also been noted in the medical record that the patient has had worsening lower extremity edema and abdominal wall edema at least for the past week. The patient had a recentadmitto the hospital from 01/03/2019 through 01/08/2019 for increasing generalized weakness and frequent falls. She was diagnosed with a UTI. She was sent to a skilled nursing facility at that time. Since that discharge, the patient has been transition from furosemide to torsemide, but it is unclear exactly when this transition occurred.  Assessment/Plan: AcutesystolicCHF/anasarca -Treated with IV furosemide, transitioned to>>po furosemide -Daily weights--NEG 5.4 kg (123.3 kg--->116.6kg) -Accurate I's and O's--NEG 8 .3L -01/04/2019 echo EF 45-50%, no WMA, elevated RV pressure 58.0. Dilated IVC;moderate TR, mild decreased RV function -chest x-ray--increased interstitial markings, left pleural effusion  -suspect degree of Cor Pulmonale, possibly due to undiagnosed OSA -transition to oral furosemide per nephrology on 02/18/19  Acute on chronic renal failure--CKD stage IV -Baseline creatinine 1.6-1.8 -Presented with serum creatinine 2.57 -No hydronephrosis noted on CT abdomen and pelvis -Urinalysis bland -renal consult appreciated>>>transition to oral furosemide 3/18 -will need to "tolerate"/accept worse renal function for improved pulm function  Chronic atrial fibrillation -CHADSVASc = 7 (CHF, HTN, Age, Female, DM, CAD) -Not on anticoagulation secondary to history of frequent falls -Continue metoprolol -Personally reviewed EKG--atrial fibrillation, no ST-T wave changes -continue ASA 81 mg daily -increased metoprolol to 50 mg bid for better rate control  Acute metabolic encephalopathy -no fever noted since admission -B12--295 -TSH--3.249 -Free T4--0.94 -ammonia--14 -grandson (POA) states pt has had gradual cognitive decline over last 4-5 months -A&Ox2 during my exam--probably her baseline -due to CHF, acute on chronic renal failure, hospital delirium appears to be resolving for the most part  Diabetes mellitus type 2, uncontrolled with hyperglycemia -Holding glipizide -c/n  Lantus at increased dose of 8 units -change to moderate SSI -NovoLog sliding scale -Hemoglobin A1c--8.9 -Holding Januvia  Essential hypertension -c/nmetoprolol tartrate  Hyperlipidemia -Consider stopping Crestor  Fibromyalgia -c/n Lyrica and Paxil  Recent right humerus fracture -Follow-up orthopedics, Dr. Romeo Apple  Goals of Care -DNR -anticipate continued gradual functional and cognitive -long discussion with grandson (HPOA)-->  treat the treatable for now -difficult family dynamics -palliative consult appreciated Patient and family declined transfer to SNF Awaiting hospice consult for possible discharge home with hospice services over the next couple days    Disposition  Plan:SNF 3/19 if stable Family Communication:updated grandson (POA) at bedside 3/19  Consultants:palliative medicine  Code Status: DNR  DVT Prophylaxis: SCDs   Procedures: As Listed in Progress Note Above  Antibiotics: Cephalexin 3/12>>>3/17   Subjective: Patient's grandson, daughter-in-law at bedside, overall in  good spirits eating or drinking okay   Objective: Vitals:   02/18/19 2210 02/19/19 0500 02/19/19 0522 02/19/19 1420  BP: 137/74  139/72 124/69  Pulse: 96  87 91  Resp: 20  (!) 24   Temp: 97.7 F (36.5 C)  98.1 F (36.7 C) 98.4 F (36.9 C)  TempSrc: Oral  Oral Oral  SpO2: 97%  92% 91%  Weight:  113 kg    Height:        Intake/Output Summary (Last 24 hours) at 02/19/2019 1701 Last data filed at 02/19/2019 1230 Gross per 24 hour  Intake 480 ml  Output 500 ml  Net -20 ml   Weight change: -3.6 kg Exam:    Physical Exam Patient is examined daily including today on 02/19/19  , exams remain the same as of yesterday except that has changed   Gen:- Awake Alert, resting comfortably HEENT:- [place].AT, No sclera icterus Neck-Supple Neck,No JVD,.  Lungs-   fair air movement, no adventitious sounds CV- S1, S2 normal Abd-  +ve B.Sounds, Abd Soft, No tenderness,    Extremity/Skin:- No  edema,    Psych-affect is appropriate, oriented x3 Neuro-no new focal deficits, no tremors   Data Reviewed: I have personally reviewed following labs and imaging studies Basic Metabolic Panel: Recent Labs  Lab 02/15/19 0606 02/16/19 0544 02/17/19 0425 02/18/19 0442 02/19/19 0443  NA 139 141 141 140 141  K 4.3 3.8 3.9 3.8 3.5  CL 99 99 101 98 101  CO2 31 32 29 33* 31  GLUCOSE 214* 178* 160* 205* 210*  BUN 60* 63* 63* 60* 58*  CREATININE 2.34* 2.46* 2.34* 2.26* 2.12*  CALCIUM 8.9 9.2 9.2 9.0 8.9   Liver Function Tests: No results for input(s): AST, ALT, ALKPHOS, BILITOT, PROT, ALBUMIN in the last 168 hours. No results for input(s): LIPASE, AMYLASE in the  last 168 hours. Recent Labs  Lab 02/13/19 0934  AMMONIA 14   Coagulation Profile: No results for input(s): INR, PROTIME in the last 168 hours. CBC: Recent Labs  Lab 02/16/19 0544  WBC 8.8  HGB 9.7*  HCT 35.0*  MCV 83.9  PLT 193   Cardiac Enzymes: Recent Labs  Lab 02/13/19 0934  CKTOTAL 35*   BNP: Invalid input(s): POCBNP CBG: Recent Labs  Lab 02/18/19 1643 02/18/19 2211 02/19/19 0729 02/19/19 1117 02/19/19 1630  GLUCAP 164* 178* 206* 241* 159*   HbA1C: No results for input(s): HGBA1C in the last 72 hours. Urine analysis:    Component Value Date/Time   COLORURINE YELLOW 02/12/2019 1734   APPEARANCEUR CLEAR 02/12/2019 1734   LABSPEC 1.011 02/12/2019 1734   PHURINE 5.0 02/12/2019 1734   GLUCOSEU NEGATIVE 02/12/2019 1734   GLUCOSEU neg 04/07/2010   HGBUR NEGATIVE 02/12/2019 1734   BILIRUBINUR NEGATIVE 02/12/2019 1734   KETONESUR NEGATIVE 02/12/2019 1734   PROTEINUR NEGATIVE 02/12/2019 1734   UROBILINOGEN 0.2 09/27/2014 1630   NITRITE NEGATIVE 02/12/2019 1734   LEUKOCYTESUR NEGATIVE 02/12/2019 1734   Sepsis Labs: @LABRCNTIP (procalcitonin:4,lacticidven:4) ) Recent Results (from the past 240 hour(s))  MRSA PCR Screening     Status: Abnormal   Collection Time: 02/12/19  8:40 PM  Result Value Ref Range Status   MRSA by PCR POSITIVE (A) NEGATIVE Final    Comment:        The GeneXpert MRSA Assay (FDA approved for NASAL specimens only), is one component of a comprehensive MRSA colonization surveillance program. It is not intended to diagnose MRSA infection nor to guide or monitor treatment for MRSA infections.  RESULT CALLED TO, READ BACK BY AND VERIFIED WITH: TATE,R ON 02/12/19 AT 2300 BY LOY,C Performed at [facility], [address] [place], [place] [address]   Respiratory Panel by PCR     Status: None   Collection Time: 02/13/19  9:11 AM  Result Value Ref Range Status   Adenovirus NOT DETECTED NOT DETECTED Final   Coronavirus 229E NOT DETECTED  NOT DETECTED Final    Comment: (NOTE) The Coronavirus on the Respiratory Panel, DOES NOT test for the novel  Coronavirus (2019 nCoV)    Coronavirus HKU1 NOT DETECTED NOT DETECTED Final   Coronavirus NL63 NOT DETECTED NOT DETECTED Final   Coronavirus OC43 NOT DETECTED NOT DETECTED Final   Metapneumovirus NOT DETECTED NOT DETECTED Final   Rhinovirus / Enterovirus NOT DETECTED NOT DETECTED Final   Influenza A NOT DETECTED NOT DETECTED Final   Influenza B NOT DETECTED NOT DETECTED Final   Parainfluenza Virus 1 NOT DETECTED NOT DETECTED Final   Parainfluenza Virus 2 NOT DETECTED NOT DETECTED Final   Parainfluenza Virus 3 NOT DETECTED NOT DETECTED Final   Parainfluenza Virus 4 NOT DETECTED NOT DETECTED Final   Respiratory Syncytial Virus NOT DETECTED NOT DETECTED Final   Bordetella pertussis NOT DETECTED NOT DETECTED Final   Chlamydophila pneumoniae NOT DETECTED NOT DETECTED Final   Mycoplasma pneumoniae NOT DETECTED NOT DETECTED Final    Comment: Performed at [facility], [address], [place], [place] [address]     Scheduled Meds: . acetaminophen  650 mg Oral TID  . aspirin EC  81 mg Oral Daily  . furosemide  40 mg Oral BID  . insulin aspart  0-15 Units Subcutaneous TID WC  . insulin aspart  0-5 Units Subcutaneous QHS  . insulin glargine  8 Units Subcutaneous Daily  . metoprolol tartrate  50 mg Oral BID  . PARoxetine  40 mg Oral Daily  . potassium chloride SA  20 mEq Oral Daily  . pregabalin  50 mg Oral Daily  . rosuvastatin  10 mg Oral q1800  . sodium chloride flush  3 mL Intravenous Q12H   Continuous Infusions: . sodium chloride    . sodium chloride 250 mL (02/16/19 1251)  . ferric gluconate (FERRLECIT/NULECIT) IV 125 mg (02/19/19 1043)    Procedures/Studies: Ct Abdomen Pelvis Wo Contrast  Result Date: 02/12/2019 CLINICAL DATA:  85 year old female with history of abdominal distension, fever and altered mental status. Increasing falls. EXAM: CT ABDOMEN AND PELVIS  WITHOUT CONTRAST TECHNIQUE: Multidetector CT imaging of the abdomen and pelvis was performed following the standard protocol without IV contrast. COMPARISON:  CT the abdomen and pelvis 11/10/2016. FINDINGS: Lower chest: Cardiomegaly. Atherosclerotic calcifications in the descending thoracic aorta as well as the left anterior descending, left circumflex and right coronary arteries. Calcifications of the aortic valve and mitral annulus. Moderate left pleural effusion. Areas of passive atelectasis are noted in the left lung base. Edema throughout the lower left chest wall and visualized portions of the left breast. Hepatobiliary: No definite suspicious cystic or solid hepatic lesions are confidently identified on today's noncontrast CT examination. Status post cholecystectomy. Pancreas: Pancreatic atrophy. No definite pancreatic mass or peripancreatic fluid or inflammatory changes. Spleen: Unremarkable. Adrenals/Urinary Tract: 2.3 cm low-attenuation lesion in the medial aspect of the upper pole of the left kidney, incompletely characterized on today's noncontrast CT examination, but statistically likely a cyst. Unenhanced appearance of the right kidney is normal. No hydroureteronephrosis. Urinary bladder is unremarkable. Stomach/Bowel: Normal appearance of the stomach. No pathologic  dilatation of small bowel or colon. Numerous colonic diverticulae are noted. Due to surrounding ascites, accurate assessment for focal inflammation indicative of acute diverticulitis is not possible on today's examination, but no focal inflammatory changes are confidently identified given these limitations. Appendix is not confidently identified may be surgically absent. Vascular/Lymphatic: Aortic atherosclerosis. No lymphadenopathy noted in the abdomen or pelvis. Reproductive: Status post hysterectomy. Ovaries are not confidently identified may be surgically absent or atrophic. Other: Small volume of ascites. No pneumoperitoneum. Diffuse  body wall edema. Musculoskeletal: Status post PLIF from L3-L5 with interbody grafts at L3-L4 and L4-L5. There are no aggressive appearing lytic or blastic lesions noted in the visualized portions of the skeleton. IMPRESSION: 1. Small volume of ascites, left pleural effusion and diffuse body wall edema; imaging findings suggestive of a state of anasarca. 2. No other acute findings are confidently identified in the abdomen or pelvis to account for the patient's symptoms. 3. Cardiomegaly. 4. Aortic atherosclerosis, in addition to least 3 vessel coronary artery disease. 5. There are calcifications of the aortic valve and mitral annulus. Echocardiographic correlation for evaluation of potential valvular dysfunction may be warranted if clinically indicated. 6. Colonic diverticulosis. Electronically Signed   By: Trudie Reed M.D.   On: 02/12/2019 16:50   Dg Chest 1 View  Result Date: 02/12/2019 CLINICAL DATA:  Acute presentation with weakness. EXAM: CHEST  1 VIEW COMPARISON:  01/03/2019 FINDINGS: Artifact overlies the chest. Left Port-A-Cath tip in the SVC above the right atrium. Right lung remains clear. There is worsened infiltrate and volume loss in the left lower lobe. There is probably a left effusion as well. IMPRESSION: Worsening of disease on the left. Probable left lower lobe pneumonia and collapse. Suspected left effusion. Electronically Signed   By: Paulina Fusi M.D.   On: 02/12/2019 16:33   Dg Shoulder Right  Result Date: 02/11/2019 CLINICAL DATA:  Right shoulder pain after a fall tonight EXAM: RIGHT SHOULDER - 2+ VIEW COMPARISON:  None. FINDINGS: Mildly comminuted fracture of the humeral shaft with mild angulation. A sharp fracture fragment is present anteriorly and medially. Osteopenic appearance. Located glenohumeral and acromioclavicular joints. IMPRESSION: 1. Humeral shaft fracture as described on dedicated exam. 2. Located shoulder. Electronically Signed   By: Marnee Spring M.D.   On:  02/11/2019 05:02   Dg Humerus Right  Result Date: 02/11/2019 CLINICAL DATA:  Fall with right arm pain EXAM: RIGHT HUMERUS - 2+ VIEW COMPARISON:  None. FINDINGS: Mildly comminuted fracture of the mid humeral shaft with apex ventral and medial angulation. A sharp bone fragment is present medially. Located glenohumeral and acromioclavicular joints. IMPRESSION: Comminuted fracture of the humeral shaft with fracture likely extending to the surgical neck. A sharp bone fragment is present medially. Electronically Signed   By: Marnee Spring M.D.   On: 02/11/2019 05:03    Verdean Murin Mariea Clonts, MD  [facility]  If 7PM-7AM, please contact night-coverage [email or web address] Password [email or web address] 02/19/2019, 5:01 PM   LOS: 7 days

## 2019-02-19 NOTE — Care Management (Addendum)
Patient Information   Patient Name [name], [name] ([id number]) Sex Female DOB Apr 30, 1933  Room Bed  A301 A301-01  Patient Demographics   Address [place] [address] Phone [phone] [facility]) [phone] (Mobile) *Preferred* E-mail Address [email or web address]  Patient Ethnicity & Race   Ethnic Group Patient Race  Not Hispanic or Latino White or Caucasian  Emergency Contact(s)   Name Relation Home Work [name]   [phone]  [name] Daughter   [phone]  Documents on File    Status Date Received Description  Documents for the Patient  EMR Medication Summary Not Received    EMR Problem Summary Not Received    EMR Patient Summary Not Received    [place] E-Signature HIPAA Notice of Privacy Received 01/04/11   [facility] E-Signature HIPAA Notice of Privacy [place] Received 01/22/12 CCS  Advance Directives/Living Will/HCPOA/POA Not Received    Release of Information Not Received    Driver's License Received 63/14/97 Expired-04/2012-CCS  Historic Radiology Documentation Not Received    Historic Radiology Documentation Not Received    Historic Radiology Documentation Not Received    Insurance Card Received 03/01/11 OLD  Financial Application Not Received    Insurance Card Not Received  AARP MCR-Iss:10/13/10-old  [place] Received 09/30/13 [place] CCS  Insurance Card Received 01/22/12 AARP Medicare Complete-old  [employer] HIPAA Notice of Privacy - E Signature Signed 09/30/13 CCS  Insurance Card Received 07/07/12 AARP MCR-Iss:09/07/11-CCS-old  HIM ROI Authorization Not Received    Insurance Card Received 09/30/13 [place] Connect HIPAA Notice of Privacy - Scanned Received 09/30/13 FP CCS  Other Not Received 04/09/14   Insurance Card Received 04/09/14 AARP-MCR-CCS-old  [place] HIPAA NOTICE OF PRIVACY - Scanned Received 04/09/14 PHI-CCS  HIM ROI Authorization  (Expired) 10/23/14 Authorization for batch ECS JC 10/23/2014 #9  HIM ROI Authorization  11/07/14 [employer]  Other Photo ID Received 02/12/19 POA  Insurance Card Received 03/09/16 Aetna 2016  [place] E-Signature HIPAA Notice of Privacy Received 02/63/78   Driver's License Received 58/85/02 exp 2018  Insurance Card Received 03/09/16 Aetna 2017  Advanced Beneficiary Notice (ABN) Received 05/28/17 [facility] GNA  E-Signature AOB Spanish Not Received    Driver's License Received 77/41/28 [place]  Insurance Card Received 05/21/17 [place] E-Signature HIPAA Notice of Privacy Signed 05/21/17   Release of Information Received 05/22/17 GNA DPR  HIM ROI Authorization  05/23/17 Aetna Denial  Insurance Card     HIM ROI Authorization  09/15/18 [facility]  Insurance Card Received 09/15/18 tfc-aetna 2019 $25  Release of Information   tfc-dpr 2019  [place] E-Signature HIPAA Notice of Privacy Signed 01/03/19   HIM ROI Authorization Received 78/67/67 [facility] [place]  HIM ROI Authorization Received 20/94/70 [place] E-Signature HIPAA Notice of Privacy Signed 02/12/19   [place] HIPAA NOTICE OF PRIVACY - Scanned Received (Deleted) 96/28/36   Driver's License Not Received (Deleted)    Driver's License (Deleted) 62/94/76   Insurance Card Received (Deleted) 03/15/11   [place] [facility] HIPAA Notice of Privacy - Scanned Not Received (Deleted)    Other Not Received (Deleted)    Insurance Card Not Received (Deleted)    Insurance Card Not Received (Deleted)    Other Not Received (Deleted)    Other Not Received (Deleted)    Software engineer Notice of Privacy - Scanned Not Received (Deleted)    HIM Release of  Information Output (Deleted) 09/15/18 Requested records  HIM Release of Information Output (Deleted) 12/04/18 Requested records  HIM Release of Information Output (Deleted) 01/08/19 Requested  records  HIM Release of Information Output (Deleted) 01/08/19 Requested records  Documents for the Encounter  AOB (Assignment of Insurance Benefits) Not Received    E-signature AOB Signed 02/12/19   MEDICARE RIGHTS Not Received    E-signature Medicare Rights Signed 02/12/19   ED Patient Billing Extract   ED PB Billing Extract  HIM Release of Information Output   [id number]  EKG Received 02/13/19   Admission Information   Current Information   Attending Provider Admitting Provider Admission Type Admission Status  Lauren Hockey, MD Lauren Grout, MD Emergency Admission (Confirmed)       Admission Date/Time Discharge Date Hospital Service Auth/Cert Status  97/58/83 02:42 PM  Internal Medicine [place] Unit Room/Bed   [facility] AP-DEPT 300 A301/A301-01        Admission   [facility] Account   Name Acct ID Class Status Primary Coverage  [name] [id number] Inpatient Open [place]      Guarantor Account (for Hospital Account [id number])   Name Relation to Pt Service Area Active? Acct Type  [name] Self CHSA Yes Personal/Family  Address Phone    [address] [place], [place] [address] [phone])        Coverage Information (for Hospital Account [id number])   F/O Payor/Plan Precert #  AETNA [place] MEDICARE HMO/PPO   Subscriber Subscriber #  [name] [facility]  Address Phone  [address] [place] [address] [phone]         [place] # [phone]

## 2019-02-19 NOTE — Progress Notes (Signed)
[facility] ROUNDING NOTE   Subjective:   Brief history 85 year old lady with history of chronic kidney disease stage III/IV history of diabetes mellitus type 2 hypertension DJD history of stroke history of congestive heart failure ejection fraction of 45 to 50%.  History of breast cancer in remission.  Presented to the emergency room with volume overload and acute on chronic kidney disease appears that the creatinine is increased from 1.8-1.9 at baseline to 2.5.  Somewhat better today.  Candidate for palliative care and hospice.  This is being evaluated by the hospitalist service  Blood pressure 139/72 pulse 87 temperature 98.1 2 sats 92% room air  Urine output 2300 cc weight down to 113 Kg  Sodium 141 potassium 3.5 chloride 101 CO2 31 BUN 58 creatinine 2.12 glucose 210 calcium 8.9   Aspirin 81 mg daily, Keflex 500 mg every 8 hours, Lasix 80 mg twice daily, Lopressor 37.5 mg twice daily, Crestor 10 mg daily, Paxil 40 mg daily, insulin sliding scale, glargine 6 units nightly, Lyrica 50 mg daily, albuterol every 6 hours as needed.  , Ferrilicit 638 mg daily x8  CT scan abdomen and pelvis 02/12/2019 small amount of ascites left pleural effusion diffuse edema of abdominal wall cardiomegaly atherosclerosis calcifications aortic valve colonic diverticulosis. Chest x-ray 02/12/2019 worsening left pleural effusion.    Objective:  Vital signs in last 24 hours:  Temp:  [97.7 F (36.5 C)-98.5 F (36.9 C)] 98.1 F (36.7 C) (03/19 0522) Pulse Rate:  [74-96] 87 (03/19 0522) Resp:  [17-24] 24 (03/19 0522) BP: (109-139)/(58-74) 139/72 (03/19 0522) SpO2:  [92 %-97 %] 92 % (03/19 0522) Weight:  [756 kg] 113 kg (03/19 0500)  Weight change: -3.6 kg Filed Weights   02/17/19 0609 02/18/19 0526 02/19/19 0500  Weight: 117.9 kg 116.6 kg 113 kg    Intake/Output: I/O last 3 completed shifts: In: 1080 [P.O.:1080] Out: 2300 [Urine:2300]   Intake/Output this shift:  No intake/output data  recorded. Frail lady nondistressed CVS-tachycardic.  Coarse systolic murmur JVP does not appear elevated RS- anteriorly appeared to be clear with no wheezes or rales ABD- BS present soft non-distended GU.  Collecting clear yellow urine EXT-edema seems much improved.  Brace and sling   Basic Metabolic Panel: Recent Labs  Lab 02/15/19 0606 02/16/19 0544 02/17/19 0425 02/18/19 0442 02/19/19 0443  NA 139 141 141 140 141  K 4.3 3.8 3.9 3.8 3.5  CL 99 99 101 98 101  CO2 31 32 29 33* 31  GLUCOSE 214* 178* 160* 205* 210*  BUN 60* 63* 63* 60* 58*  CREATININE 2.34* 2.46* 2.34* 2.26* 2.12*  CALCIUM 8.9 9.2 9.2 9.0 8.9    Liver Function Tests: Recent Labs  Lab 02/12/19 1539  AST 21  ALT 12  ALKPHOS 29*  BILITOT 0.3  PROT 5.8*  ALBUMIN 2.8*   No results for input(s): LIPASE, AMYLASE in the last 168 hours. Recent Labs  Lab 02/13/19 0934  AMMONIA 14    CBC: Recent Labs  Lab 02/12/19 1539 02/16/19 0544  WBC 7.7 8.8  NEUTROABS 5.5  --   HGB 9.1* 9.7*  HCT 32.3* 35.0*  MCV 84.6 83.9  PLT 154 193    Cardiac Enzymes: Recent Labs  Lab 02/12/19 1539 02/13/19 0934  CKTOTAL  --  35*  TROPONINI 0.03*  --     BNP: Invalid input(s): POCBNP  CBG: Recent Labs  Lab 02/18/19 0733 02/18/19 1108 02/18/19 1643 02/18/19 2211 02/19/19 0729  GLUCAP 192* 194* 164* 178* 206*  Microbiology: Results for orders placed or performed during the hospital encounter of 02/12/19  MRSA PCR Screening     Status: Abnormal   Collection Time: 02/12/19  8:40 PM  Result Value Ref Range Status   MRSA by PCR POSITIVE (A) NEGATIVE Final    Comment:        The GeneXpert MRSA Assay (FDA approved for NASAL specimens only), is one component of a comprehensive MRSA colonization surveillance program. It is not intended to diagnose MRSA infection nor to guide or monitor treatment for MRSA infections. RESULT CALLED TO, READ BACK BY AND VERIFIED WITH: TATE,R ON 02/12/19 AT 2300 BY  LOY,C Performed at [facility], [address] [place], [place] [address]   Respiratory Panel by PCR     Status: None   Collection Time: 02/13/19  9:11 AM  Result Value Ref Range Status   Adenovirus NOT DETECTED NOT DETECTED Final   Coronavirus 229E NOT DETECTED NOT DETECTED Final    Comment: (NOTE) The Coronavirus on the Respiratory Panel, DOES NOT test for the novel  Coronavirus (2019 nCoV)    Coronavirus HKU1 NOT DETECTED NOT DETECTED Final   Coronavirus NL63 NOT DETECTED NOT DETECTED Final   Coronavirus OC43 NOT DETECTED NOT DETECTED Final   Metapneumovirus NOT DETECTED NOT DETECTED Final   Rhinovirus / Enterovirus NOT DETECTED NOT DETECTED Final   Influenza A NOT DETECTED NOT DETECTED Final   Influenza B NOT DETECTED NOT DETECTED Final   Parainfluenza Virus 1 NOT DETECTED NOT DETECTED Final   Parainfluenza Virus 2 NOT DETECTED NOT DETECTED Final   Parainfluenza Virus 3 NOT DETECTED NOT DETECTED Final   Parainfluenza Virus 4 NOT DETECTED NOT DETECTED Final   Respiratory Syncytial Virus NOT DETECTED NOT DETECTED Final   Bordetella pertussis NOT DETECTED NOT DETECTED Final   Chlamydophila pneumoniae NOT DETECTED NOT DETECTED Final   Mycoplasma pneumoniae NOT DETECTED NOT DETECTED Final    Comment: Performed at [facility], [address] [place], [place] [address]    Coagulation Studies: No results for input(s): LABPROT, INR in the last 72 hours.  Urinalysis: No results for input(s): COLORURINE, LABSPEC, PHURINE, GLUCOSEU, HGBUR, BILIRUBINUR, KETONESUR, PROTEINUR, UROBILINOGEN, NITRITE, LEUKOCYTESUR in the last 72 hours.  Invalid input(s): APPERANCEUR    Imaging: No results found.   Medications:   . sodium chloride    . sodium chloride 250 mL (02/16/19 1251)  . ferric gluconate (FERRLECIT/NULECIT) IV 125 mg (02/18/19 1210)   . acetaminophen  650 mg Oral TID  . aspirin EC  81 mg Oral Daily  . furosemide  80 mg Oral BID  . insulin aspart  0-15 Units  Subcutaneous TID WC  . insulin aspart  0-5 Units Subcutaneous QHS  . insulin glargine  8 Units Subcutaneous Daily  . metoprolol tartrate  50 mg Oral BID  . PARoxetine  40 mg Oral Daily  . potassium chloride SA  20 mEq Oral Daily  . pregabalin  50 mg Oral Daily  . rosuvastatin  10 mg Oral q1800  . sodium chloride flush  3 mL Intravenous Q12H   sodium chloride, sodium chloride, albuterol, HYDROcodone-acetaminophen, ondansetron (ZOFRAN) IV, sodium chloride flush  Assessment/ Plan:   Acute on chronic kidney disease with no clear reversible etiology for worsening renal function she appears to be doing well with diuresis.  Her weight is down and her urine output is good.  She has stage IV chronic kidney disease at baseline is not a dialysis candidate.  Agree with avoiding  nephrotoxins hypertension maintaining euvolemia.  Patient appears to be  doing well this morning.  She is responding well to IV diuresis.  Transition to Lasix 80 mg twice daily 02/18/2019 will decrease Lasix to 40 mg twice daily 02/19/2019.  As an outpatient She was taking torsemide 40 mg daily.   anemia has iron deficiency would continue to replete with IV iron.  Volume/hypertension.  Appears to be doing well blood pressure appropriate continue with diuresis.  Atrial fibrillation.  Rate controlled not candidate for anticoagulation secondary to falls  Diabetes mellitus as per primary team  Bones PTH 42  Will sign off on patient today thank you very much for most interesting consult will be happy to reconsult if necessary   LOS: [place] $RemoveBefo'@TODAY'zCuLLDSmggF$ $Remov'@9'OHIeDT$ :56 AM

## 2019-02-19 NOTE — Progress Notes (Signed)
Physical Therapy Treatment Patient Details Name: [name] MRN: [id number] DOB: 09/17/1933 Today's Date: 02/19/2019    History of Present Illness Patient is an 85 year old female admitted 02/12/2019 with diagnosis of acute CHF, anasarca. PMH: a fib, HTN, aortic stenosis, AKI, CKD, fibromyalgia, fall infrequently, obesity, cirrhosis, Rt sided breast cancer in remission, stroke, recent right humeral fracture. From [facility].    PT Comments    Patient's family present at bedside for family training in bed mobility and transfers.  Patient demonstrates slow labored movement for sitting up at bedside using LUE, attempted sit to stands using Hemi-walker and patient unable due to weakness and fair/poor carryover for following instructions.  Patient and family explained it would be unsafe for them to attempt transfers requiring total assist and recommended to them possible use of mechanical lift and/or at least sit the patient up at bedside daily as tolerated with supervision.  Patient requested to continue sitting up at bedside with family supervising after therapy.  Patient will benefit from continued physical therapy in hospital and recommended venue below to increase strength, balance, endurance for safe ADLs and gait.    Follow Up Recommendations  SNF;Supervision/Assistance - 24 hour     Equipment Recommendations  None recommended by PT    Recommendations for Other Services       Precautions / Restrictions Precautions Precautions: Shoulder Type of Shoulder Precautions: recent right humeral fracture  Shoulder Interventions: Shoulder sling/immobilizer Precaution Comments: LUE in splint Restrictions Weight Bearing Restrictions: Yes RUE Weight Bearing: Non weight bearing    Mobility  Bed Mobility Overal bed mobility: Needs Assistance Bed Mobility: Supine to Sit     Supine to sit: Mod assist     General bed mobility comments: head of bed raised, used LUE to help pull self to  sitting  Transfers                    Ambulation/Gait                 Stairs             Wheelchair Mobility    Modified Rankin (Stroke Patients Only)       Balance Overall balance assessment: Needs assistance Sitting-balance support: Feet supported;No upper extremity supported Sitting balance-Leahy Scale: Fair Sitting balance - Comments: fair/good supporting with LUE                                    Cognition Arousal/Alertness: Awake/alert Behavior During Therapy: WFL for tasks assessed/performed Overall Cognitive Status: Within Functional Limits for tasks assessed                                        Exercises General Exercises - Lower Extremity Long Arc Quad: Seated;AROM;Strengthening;Both;10 reps Hip Flexion/Marching: Strengthening;Seated;AROM;Both;10 reps Toe Raises: Seated;Strengthening;AROM;Both;10 reps Heel Raises: Seated;AROM;Both;10 reps;Strengthening    General Comments        Pertinent Vitals/Pain Pain Assessment: Faces Faces Pain Scale: Hurts even more Pain Location: RUE with movement/pressure Pain Descriptors / Indicators: Sharp;Sore;Grimacing;Guarding Pain Intervention(s): Limited activity within patient's tolerance;Monitored during session;Premedicated before session    Home Living                      Prior Function  PT Goals (current goals can now be found in the care plan section) Acute Rehab PT Goals Patient Stated Goal: go home; not return to [facility] PT Goal Formulation: With patient Time For Goal Achievement: 02/27/19 Potential to Achieve Goals: Fair Progress towards PT goals: Progressing toward goals    Frequency    Min 3X/week      PT Plan Current plan remains appropriate    Co-evaluation              AM-PAC PT "6 Clicks" Mobility   Outcome Measure  Help needed turning from your back to your side while in a flat bed without using  bedrails?: A Lot Help needed moving from lying on your back to sitting on the side of a flat bed without using bedrails?: A Lot Help needed moving to and from a bed to a chair (including a wheelchair)?: Total Help needed standing up from a chair using your arms (e.g., wheelchair or bedside chair)?: Total Help needed to walk in hospital room?: Total Help needed climbing 3-5 steps with a railing? : Total 6 Click Score: 8    End of Session Equipment Utilized During Treatment: Gait belt Activity Tolerance: Patient tolerated treatment well;Patient limited by fatigue;Patient limited by pain Patient left: in bed;with family/visitor present;with call bell/phone within reach(seated at bedside) Nurse Communication: Mobility status PT Visit Diagnosis: Muscle weakness (generalized) (M62.81);Other abnormalities of gait and mobility (R26.89);Unsteadiness on feet (R26.81)     Time: 1110-1145 PT Time Calculation (min) (ACUTE ONLY): 35 min  Charges:  $Therapeutic Exercise: 8-22 mins $Therapeutic Activity: 8-22 mins                     2:06 PM, 02/19/19 [name], MPT Physical Therapist with [facility] [phone] office [phone] mobile phone

## 2019-02-19 NOTE — Care Management (Signed)
Received consult from Palliative NP that family would like to discuss hospice in home options. Called Mindy daughter, she reports they are interested in taking patient home with hospice. Discussed options for hospice agencies. She elects [facility]. Will send referral. Discussed equipment needs with Mindy, she thinks patient will need hospital bed.

## 2019-02-20 ENCOUNTER — Ambulatory Visit (HOSPITAL_COMMUNITY): Payer: Self-pay | Admitting: Nurse Practitioner

## 2019-02-20 ENCOUNTER — Ambulatory Visit (HOSPITAL_COMMUNITY): Payer: Self-pay | Admitting: Internal Medicine

## 2019-02-20 LAB — GLUCOSE, CAPILLARY
GLUCOSE-CAPILLARY: 255 mg/dL — AB (ref 70–99)
Glucose-Capillary: 149 mg/dL — ABNORMAL HIGH (ref 70–99)
Glucose-Capillary: 207 mg/dL — ABNORMAL HIGH (ref 70–99)
Glucose-Capillary: 197 mg/dL — ABNORMAL HIGH (ref 70–99)

## 2019-02-20 MED ORDER — SENNA 8.6 MG PO TABS: 1.0000 | ORAL_TABLET | Freq: Every day | ORAL | Status: DC | PRN

## 2019-02-20 NOTE — Clinical Social Work Note (Signed)
Called [name] at [facility] to coordinate d/c.  She states the needed equipment is being delivered today.  Let her know he pt is not d/cing today, and she responded that she will let the family know to call on-call nurse when pt is returning home.

## 2019-02-20 NOTE — Progress Notes (Signed)
Physical Therapy Treatment Patient Details Name: [name] MRN: [id number] DOB: 04-27-1933 Today's Date: 02/20/2019    History of Present Illness Patient is an 85 year old female admitted 02/12/2019 with diagnosis of acute CHF, anasarca. PMH: a fib, HTN, aortic stenosis, AKI, CKD, fibromyalgia, fall infrequently, obesity, cirrhosis, Rt sided breast cancer in remission, stroke, recent right humeral fracture. From [place] [facility].    PT Comments    Pt supine in bed and willing to participate with therapy today.  Improved bed mobility with min assistance following cueing for use of Lt UE to assist with handrails and placed on bed to assist with scooting to EOB.  Seated balance and LE strengthening exercises complete with good form and control following initial instructions with exercise.  EOS pt requested to sit on edge of bed.  Pt's sling positioned for more support to assist with pain control.  RN aware of pt stated and pt requested pain medication as well.     Follow Up Recommendations  SNF;Supervision/Assistance - 24 hour     Equipment Recommendations  None recommended by PT    Recommendations for Other Services       Precautions / Restrictions Precautions Precautions: Shoulder Type of Shoulder Precautions: recent right humeral fracture  Shoulder Interventions: Shoulder sling/immobilizer Precaution Comments: RtUE in sling Restrictions Weight Bearing Restrictions: Yes RUE Weight Bearing: Non weight bearing Other Position/Activity Restrictions: recent right humeral fracture - Dr Aline Brochure has been asked for a consult    Mobility  Bed Mobility Overal bed mobility: Modified Independent Bed Mobility: Supine to Sit     Supine to sit: Mod assist     General bed mobility comments: head of bed raised, used LUE to help pull self to sitting  Transfers                 General transfer comment: requested not to complete standing due to pain and fatigue  Ambulation/Gait                  Stairs             Wheelchair Mobility    Modified Rankin (Stroke Patients Only)       Balance                                            Cognition Arousal/Alertness: Awake/alert Behavior During Therapy: WFL for tasks assessed/performed Overall Cognitive Status: Within Functional Limits for tasks assessed                                        Exercises General Exercises - Lower Extremity Long Arc Quad: Seated;AROM;Strengthening;Both;10 reps Hip ABduction/ADduction: AROM;Both;10 reps;Seated Hip Flexion/Marching: Strengthening;Seated;AROM;Both;10 reps Toe Raises: Seated;Strengthening;AROM;Both;10 reps Heel Raises: Seated;AROM;Both;10 reps;Strengthening    General Comments        Pertinent Vitals/Pain Pain Assessment: 0-10 Pain Score: 8  Pain Location: RUE with movement/pressure Pain Descriptors / Indicators: Aching;Sore;Grimacing;Guarding Pain Intervention(s): Limited activity within patient's tolerance;Monitored during session;Repositioned;Patient requesting pain meds-RN notified    Home Living                      Prior Function            PT Goals (current goals can now be found in the care  plan section) Progress towards PT goals: Progressing toward goals    Frequency    Min 3X/week      PT Plan Current plan remains appropriate    Co-evaluation              AM-PAC PT "6 Clicks" Mobility   Outcome Measure  Help needed turning from your back to your side while in a flat bed without using bedrails?: A Lot Help needed moving from lying on your back to sitting on the side of a flat bed without using bedrails?: A Lot Help needed moving to and from a bed to a chair (including a wheelchair)?: Total Help needed standing up from a chair using your arms (e.g., wheelchair or bedside chair)?: Total Help needed to walk in hospital room?: Total Help needed climbing 3-5 steps with a  railing? : Total 6 Click Score: 8    End of Session Equipment Utilized During Treatment: Gait belt Activity Tolerance: Patient tolerated treatment well;Patient limited by fatigue;Patient limited by pain Patient left: in bed;with call bell/phone within reach;with bed alarm set Nurse Communication: Mobility status       Time: 1610-1640 PT Time Calculation (min) (ACUTE ONLY): 30 min  Charges:  $Therapeutic Exercise: 8-22 mins $Therapeutic Activity: 8-22 mins                     [address] LPTA; [place]  Aldona Lento 02/20/2019, 4:47 PM

## 2019-02-20 NOTE — Progress Notes (Signed)
PROGRESS NOTE  [name] VWU:[id number] DOB: June 12, 1933 DOA: 02/12/2019 PCP: Elfredia Nevins, MD   Brief History: 85 year old female with a history of right-sided inflammatory breast cancer in remission, CKD stage IV, atrial fibrillation, stroke, diabetes mellitus with retinopathy, hypertension, hyperlipidemia, B12 deficiency, frequent falls presenting from [facility] facility with reported fever and altered mental status. Unfortunately, the patient is a poor historian,and attempts to call the family were unsuccessfulinitally. Nevertheless, the patient was sent to the emergency department for the above complaints. The patient herself stated that she was told that she had confusion and fevers. During evaluation, the patient was noted to be fluid overloaded with anasarca. She was afebrile hemodynamically stable saturating 92-95% room air. WBC was 7.7. She was noted to have serum creatinine 2.57.She was started on intravenous furosemide. Although the exact history is not completely clear, it is also been noted in the medical record that the patient has had worsening lower extremity edema and abdominal wall edema at least for the past week. The patient had a recentadmitto the hospital from 01/03/2019 through 01/08/2019 for increasing generalized weakness and frequent falls. She was diagnosed with a UTI. She was sent to a skilled nursing facility at that time. Since that discharge, the patient has been transition from furosemide to torsemide, but it is unclear exactly when this transition occurred. Family states that we will have everything ready to take patient home on 02/21/2019 with  [facility]    Assessment/Plan: AcutesystolicCHF/anasarca -Treated with IV furosemide, already Transitioned to>>po furosemide -Daily weights--NEG 5.4 kg (123.3 kg--->116.6kg) -Accurate I's and O's--NEG 8 .3L -01/04/2019 echo EF 45-50%, no WMA, elevated RV pressure 58.0. Dilated  IVC;moderate TR, mild decreased RV function -chest x-ray--increased interstitial markings, left pleural effusion -suspect degree of Cor Pulmonale, possibly due to undiagnosed OSA -transition to oral furosemide per nephrology on 02/18/19  Acute on chronic renal failure--CKD stage IV -Baseline creatinine 1.6-1.8 -Presented with serum creatinine 2.57 -No hydronephrosis noted on CT abdomen and pelvis -Urinalysis bland -renal consult appreciated>>>transition to oral furosemide 3/18 -will need to "tolerate"/accept worse renal function for improved pulm function  Chronic atrial fibrillation -CHADSVASc = 7 (CHF, HTN, Age, Female, DM, CAD) -Not on anticoagulation secondary to history of frequent falls -Continue metoprolol -Personally reviewed EKG--atrial fibrillation, no ST-T wave changes -continue ASA 81 mg daily -increased metoprolol to 50 mg bid for better rate control  Acute metabolic encephalopathy -no fever noted since admission -B12--295 -TSH--3.249 -Free T4--0.94 -ammonia--14 -grandson (POA) states pt has had gradual cognitive decline over last 4-5 months -A&Ox2 during my exam--probably her baseline -due to CHF, acute on chronic renal failure, hospital delirium appears to be resolving for the most part  Diabetes mellitus type 2, uncontrolled with hyperglycemia -Holding glipizide -c/n  Lantus at increased dose of 8 units -change to moderate SSI -NovoLog sliding scale -Hemoglobin A1c--8.9 -Holding Januvia  Essential hypertension -c/nmetoprolol tartrate  Hyperlipidemia -Consider stopping Crestor  Fibromyalgia -c/n Lyrica and Paxil  Recent right humerus fracture -Follow-up orthopedics, Dr. Romeo Apple  Goals of Care -DNR -anticipate continued gradual functional and cognitive -long discussion with grandson (HPOA)-->  treat the treatable for now -difficult family dynamics -palliative consult appreciated Patient and family declined transfer to SNF Awaiting  hospice consult for possible discharge home with hospice services over the next couple days    Disposition Plan:home with Hospice 3/19 if stable Family Communication:updated grandson (POA) at bedside 3/19  Consultants:palliative medicine  Code Status: DNR  DVT Prophylaxis: SCDs  Procedures: As Listed in Progress Note Above  Antibiotics: Cephalexin 3/12>>>3/17   Subjective: Resting comfortably, looking forward to going home with home hospice  Objective: Vitals:   02/19/19 2004 02/19/19 2204 02/20/19 0015 02/20/19 0528  BP:  125/70  (!) 151/71  Pulse:  89  86  Resp:  20  20  Temp:  98.6 F (37 C)  98.6 F (37 C)  TempSrc:  Oral  Oral  SpO2: 92% (!) 89% 91% 94%  Weight:    114.1 kg  Height:       No intake or output data in the 24 hours ending 02/20/19 1525 Weight change: 1.1 kg Exam:   Physical Exam Patient is examined daily including today on 02/20/19  , exams remain the same as of yesterday except that has changed   Gen:- Awake Alert, resting comfortably HEENT:- [place].AT, No sclera icterus Neck-Supple Neck,No JVD,.  Lungs-   fair air movement, no adventitious sounds CV- S1, S2 normal Abd-  +ve B.Sounds, Abd Soft, No tenderness,    Extremity/Skin:- No  edema,    Psych-affect is appropriate, oriented x3 Neuro-no new focal deficits, no tremors   Data Reviewed: I have personally reviewed following labs and imaging studies Basic Metabolic Panel: Recent Labs  Lab 02/15/19 0606 02/16/19 0544 02/17/19 0425 02/18/19 0442 02/19/19 0443  NA 139 141 141 140 141  K 4.3 3.8 3.9 3.8 3.5  CL 99 99 101 98 101  CO2 31 32 29 33* 31  GLUCOSE 214* 178* 160* 205* 210*  BUN 60* 63* 63* 60* 58*  CREATININE 2.34* 2.46* 2.34* 2.26* 2.12*  CALCIUM 8.9 9.2 9.2 9.0 8.9   Liver Function Tests: No results for input(s): AST, ALT, ALKPHOS, BILITOT, PROT, ALBUMIN in the last 168 hours. No results for input(s): LIPASE, AMYLASE in the last 168 hours. No  results for input(s): AMMONIA in the last 168 hours. Coagulation Profile: No results for input(s): INR, PROTIME in the last 168 hours. CBC: Recent Labs  Lab 02/16/19 0544  WBC 8.8  HGB 9.7*  HCT 35.0*  MCV 83.9  PLT 193   Cardiac Enzymes: No results for input(s): CKTOTAL, CKMB, CKMBINDEX, TROPONINI in the last 168 hours. BNP: Invalid input(s): POCBNP CBG: Recent Labs  Lab 02/19/19 1117 02/19/19 1630 02/19/19 2236 02/20/19 0722 02/20/19 1108  GLUCAP 241* 159* 124* 149* 207*   HbA1C: No results for input(s): HGBA1C in the last 72 hours. Urine analysis:    Component Value Date/Time   COLORURINE YELLOW 02/12/2019 1734   APPEARANCEUR CLEAR 02/12/2019 1734   LABSPEC 1.011 02/12/2019 1734   PHURINE 5.0 02/12/2019 1734   GLUCOSEU NEGATIVE 02/12/2019 1734   GLUCOSEU neg 04/07/2010   HGBUR NEGATIVE 02/12/2019 1734   BILIRUBINUR NEGATIVE 02/12/2019 1734   KETONESUR NEGATIVE 02/12/2019 1734   PROTEINUR NEGATIVE 02/12/2019 1734   UROBILINOGEN 0.2 09/27/2014 1630   NITRITE NEGATIVE 02/12/2019 1734   LEUKOCYTESUR NEGATIVE 02/12/2019 1734   Sepsis Labs: @LABRCNTIP (procalcitonin:4,lacticidven:4) ) Recent Results (from the past 240 hour(s))  MRSA PCR Screening     Status: Abnormal   Collection Time: 02/12/19  8:40 PM  Result Value Ref Range Status   MRSA by PCR POSITIVE (A) NEGATIVE Final    Comment:        The GeneXpert MRSA Assay (FDA approved for NASAL specimens only), is one component of a comprehensive MRSA colonization surveillance program. It is not intended to diagnose MRSA infection nor to guide or monitor treatment for MRSA infections. RESULT CALLED TO, READ BACK BY  AND VERIFIED WITH: TATE,R ON 02/12/19 AT 2300 BY LOY,C Performed at [facility], [address], [place], [place] [address]   Respiratory Panel by PCR     Status: None   Collection Time: 02/13/19  9:11 AM  Result Value Ref Range Status   Adenovirus NOT DETECTED NOT DETECTED Final    Coronavirus 229E NOT DETECTED NOT DETECTED Final    Comment: (NOTE) The Coronavirus on the Respiratory Panel, DOES NOT test for the novel  Coronavirus (2019 nCoV)    Coronavirus HKU1 NOT DETECTED NOT DETECTED Final   Coronavirus NL63 NOT DETECTED NOT DETECTED Final   Coronavirus OC43 NOT DETECTED NOT DETECTED Final   Metapneumovirus NOT DETECTED NOT DETECTED Final   Rhinovirus / Enterovirus NOT DETECTED NOT DETECTED Final   Influenza A NOT DETECTED NOT DETECTED Final   Influenza B NOT DETECTED NOT DETECTED Final   Parainfluenza Virus 1 NOT DETECTED NOT DETECTED Final   Parainfluenza Virus 2 NOT DETECTED NOT DETECTED Final   Parainfluenza Virus 3 NOT DETECTED NOT DETECTED Final   Parainfluenza Virus 4 NOT DETECTED NOT DETECTED Final   Respiratory Syncytial Virus NOT DETECTED NOT DETECTED Final   Bordetella pertussis NOT DETECTED NOT DETECTED Final   Chlamydophila pneumoniae NOT DETECTED NOT DETECTED Final   Mycoplasma pneumoniae NOT DETECTED NOT DETECTED Final    Comment: Performed at [facility], [address], [place], [place] [address]     Scheduled Meds:  acetaminophen  650 mg Oral TID   aspirin EC  81 mg Oral Daily   furosemide  40 mg Oral BID   insulin aspart  0-15 Units Subcutaneous TID WC   insulin aspart  0-5 Units Subcutaneous QHS   insulin glargine  8 Units Subcutaneous Daily   metoprolol tartrate  50 mg Oral BID   PARoxetine  40 mg Oral Daily   potassium chloride SA  20 mEq Oral Daily   pregabalin  50 mg Oral Daily   rosuvastatin  10 mg Oral q1800   sodium chloride flush  3 mL Intravenous Q12H   Continuous Infusions:  sodium chloride     sodium chloride 250 mL (02/16/19 1251)   ferric gluconate (FERRLECIT/NULECIT) IV 125 mg (02/19/19 1043)   Procedures/Studies: Ct Abdomen Pelvis Wo Contrast  Result Date: 02/12/2019 CLINICAL DATA:  85 year old female with history of abdominal distension, fever and altered mental status. Increasing falls.  EXAM: CT ABDOMEN AND PELVIS WITHOUT CONTRAST TECHNIQUE: Multidetector CT imaging of the abdomen and pelvis was performed following the standard protocol without IV contrast. COMPARISON:  CT the abdomen and pelvis 11/10/2016. FINDINGS: Lower chest: Cardiomegaly. Atherosclerotic calcifications in the descending thoracic aorta as well as the left anterior descending, left circumflex and right coronary arteries. Calcifications of the aortic valve and mitral annulus. Moderate left pleural effusion. Areas of passive atelectasis are noted in the left lung base. Edema throughout the lower left chest wall and visualized portions of the left breast. Hepatobiliary: No definite suspicious cystic or solid hepatic lesions are confidently identified on today's noncontrast CT examination. Status post cholecystectomy. Pancreas: Pancreatic atrophy. No definite pancreatic mass or peripancreatic fluid or inflammatory changes. Spleen: Unremarkable. Adrenals/Urinary Tract: 2.3 cm low-attenuation lesion in the medial aspect of the upper pole of the left kidney, incompletely characterized on today's noncontrast CT examination, but statistically likely a cyst. Unenhanced appearance of the right kidney is normal. No hydroureteronephrosis. Urinary bladder is unremarkable. Stomach/Bowel: Normal appearance of the stomach. No pathologic dilatation of small bowel or colon. Numerous  colonic diverticulae are noted. Due to surrounding ascites, accurate assessment for focal inflammation indicative of acute diverticulitis is not possible on today's examination, but no focal inflammatory changes are confidently identified given these limitations. Appendix is not confidently identified may be surgically absent. Vascular/Lymphatic: Aortic atherosclerosis. No lymphadenopathy noted in the abdomen or pelvis. Reproductive: Status post hysterectomy. Ovaries are not confidently identified may be surgically absent or atrophic. Other: Small volume of ascites. No  pneumoperitoneum. Diffuse body wall edema. Musculoskeletal: Status post PLIF from L3-L5 with interbody grafts at L3-L4 and L4-L5. There are no aggressive appearing lytic or blastic lesions noted in the visualized portions of the skeleton. IMPRESSION: 1. Small volume of ascites, left pleural effusion and diffuse body wall edema; imaging findings suggestive of a state of anasarca. 2. No other acute findings are confidently identified in the abdomen or pelvis to account for the patient's symptoms. 3. Cardiomegaly. 4. Aortic atherosclerosis, in addition to least 3 vessel coronary artery disease. 5. There are calcifications of the aortic valve and mitral annulus. Echocardiographic correlation for evaluation of potential valvular dysfunction may be warranted if clinically indicated. 6. Colonic diverticulosis. Electronically Signed   By: Trudie Reed M.D.   On: 02/12/2019 16:50   Dg Chest 1 View  Result Date: 02/12/2019 CLINICAL DATA:  Acute presentation with weakness. EXAM: CHEST  1 VIEW COMPARISON:  01/03/2019 FINDINGS: Artifact overlies the chest. Left Port-A-Cath tip in the SVC above the right atrium. Right lung remains clear. There is worsened infiltrate and volume loss in the left lower lobe. There is probably a left effusion as well. IMPRESSION: Worsening of disease on the left. Probable left lower lobe pneumonia and collapse. Suspected left effusion. Electronically Signed   By: Paulina Fusi M.D.   On: 02/12/2019 16:33   Dg Shoulder Right  Result Date: 02/11/2019 CLINICAL DATA:  Right shoulder pain after a fall tonight EXAM: RIGHT SHOULDER - 2+ VIEW COMPARISON:  None. FINDINGS: Mildly comminuted fracture of the humeral shaft with mild angulation. A sharp fracture fragment is present anteriorly and medially. Osteopenic appearance. Located glenohumeral and acromioclavicular joints. IMPRESSION: 1. Humeral shaft fracture as described on dedicated exam. 2. Located shoulder. Electronically Signed   By:  Marnee Spring M.D.   On: 02/11/2019 05:02   Dg Humerus Right  Result Date: 02/11/2019 CLINICAL DATA:  Fall with right arm pain EXAM: RIGHT HUMERUS - 2+ VIEW COMPARISON:  None. FINDINGS: Mildly comminuted fracture of the mid humeral shaft with apex ventral and medial angulation. A sharp bone fragment is present medially. Located glenohumeral and acromioclavicular joints. IMPRESSION: Comminuted fracture of the humeral shaft with fracture likely extending to the surgical neck. A sharp bone fragment is present medially. Electronically Signed   By: Marnee Spring M.D.   On: 02/11/2019 05:03   Dietrich Ke Mariea Clonts, MD  [facility]  If 7PM-7AM, please contact night-coverage [email or web address] Password [email or web address] 02/20/2019, 3:25 PM   LOS: 8 days

## 2019-02-21 LAB — GLUCOSE, CAPILLARY
Glucose-Capillary: 187 mg/dL — ABNORMAL HIGH (ref 70–99)
Glucose-Capillary: 222 mg/dL — ABNORMAL HIGH (ref 70–99)

## 2019-02-21 MED ORDER — BISACODYL 10 MG RE SUPP: 10.0000 mg | RECTAL | 0 refills | Status: AC | PRN

## 2019-02-21 MED ORDER — LOPERAMIDE HCL 2 MG PO CAPS: 2.0000 mg | ORAL_CAPSULE | Freq: Every day | ORAL | 0 refills | Status: AC | PRN

## 2019-02-21 MED ORDER — METOPROLOL TARTRATE 50 MG PO TABS: 50.0000 mg | ORAL_TABLET | Freq: Two times a day (BID) | ORAL | 2 refills | Status: AC

## 2019-02-21 MED ORDER — INSULIN ASPART 100 UNIT/ML ~~LOC~~ SOLN: 0.0000 [IU] | Freq: Three times a day (TID) | SUBCUTANEOUS | 11 refills | Status: AC

## 2019-02-21 MED ORDER — ACETAMINOPHEN 325 MG PO TABS: 650.0000 mg | ORAL_TABLET | Freq: Four times a day (QID) | ORAL | 1 refills | Status: AC | PRN

## 2019-02-21 MED ORDER — CLONAZEPAM 0.5 MG PO TABS: 0.5000 mg | ORAL_TABLET | Freq: Two times a day (BID) | ORAL | 0 refills | Status: AC | PRN

## 2019-02-21 MED ORDER — SENNOSIDES-DOCUSATE SODIUM 8.6-50 MG PO TABS: 2.0000 | ORAL_TABLET | Freq: Two times a day (BID) | ORAL | 1 refills | Status: AC

## 2019-02-21 MED ORDER — FUROSEMIDE 40 MG PO TABS: 40.0000 mg | ORAL_TABLET | Freq: Two times a day (BID) | ORAL | 1 refills | Status: AC

## 2019-02-21 MED ORDER — HYDROCODONE-ACETAMINOPHEN 10-325 MG PO TABS: 1.0000 | ORAL_TABLET | Freq: Four times a day (QID) | ORAL | 0 refills | Status: AC | PRN

## 2019-02-21 MED ORDER — ESOMEPRAZOLE MAGNESIUM 40 MG PO CPDR: 40.0000 mg | DELAYED_RELEASE_CAPSULE | Freq: Every day | ORAL | 2 refills | Status: AC

## 2019-02-21 NOTE — Progress Notes (Signed)
Removed IV-clean, dry, intact. [facility] came to get patient to deliver her to her grandson's house. Gave EMS patient's belongings. Called daughter, Leslye Peer, to let her know EMS was on their way to their house.

## 2019-02-21 NOTE — Discharge Instructions (Signed)
1)Novolog Insulin---- Per Sliding scale-- CBG < 70: implement hypoglycemia protocol  CBG 70 - 120:    0 units  CBG 121 - 150: 0 units  CBG 151 - 200: 0 units  CBG 201 - 250: 0 units  CBG 251 - 300: 2 units  CBG 301 - 350:           5 units  CBG 351 - 400: 7 units  CBG > 400 call MD   2)Call Hospice Team to help you manage pain/discomfort

## 2019-02-21 NOTE — Progress Notes (Signed)
Called daughter-in-law to get address for [facility].

## 2019-02-21 NOTE — Discharge Summary (Signed)
[name], is a 85 y.o. female  DOB August 28, 1933  MRN [id number].  Admission date:  02/12/2019  Admitting Physician  Phillips Grout, MD  Discharge Date:  02/21/2019   Primary MD  Redmond School, MD  Recommendations for primary care physician for things to follow:   1)Novolog Insulin---- Per Sliding scale-- CBG < 70: implement hypoglycemia protocol  CBG 70 - 120:    0 units  CBG 121 - 150: 0 units  CBG 151 - 200: 0 units  CBG 201 - 250: 0 units  CBG 251 - 300: 2 units  CBG 301 - 350:           5 units  CBG 351 - 400: 7 units  CBG > 400 call MD   2)Call Hospice Team to help you manage pain/discomfort   Admission Diagnosis  ams,fever   Discharge Diagnosis  ams,fever    Principal Problem:   Acute CHF (congestive heart failure) (HCC) Active Problems:   Atrial fibrillation (HCC)   Essential hypertension   Aortic stenosis   Fibromyalgia   Falls infrequently   Acute kidney injury superimposed on chronic kidney disease (HCC)   Acute lower UTI (urinary tract infection)   Acute renal failure superimposed on stage 4 chronic kidney disease (HCC)   Acute diastolic CHF (congestive heart failure) (HCC)   Acute systolic CHF (congestive heart failure) ([place])   Goals of care, counseling/discussion   Palliative care encounter      Past Medical History:  Diagnosis Date   Anemia    Aortic stenosis    Mild   Arthritis    Asthma    Atrial flutter ([place]) 2002   B12 deficiency 06/20/2015   B12 deficiency 06/20/2015   Breast carcinoma ([place])    Cerebrovascular disease    with a 68% LICA; repeat study in 10/2009-no obstructive disease; modest ASVD   Chronic kidney disease    Creatinine-1.5 in 2010 and 2.5-3 in 2011; 1.5-10/2010;  Klebsiella UTI-10/2010. urine protein 36 mg/dl, mildly elevated   Decreased bone density    Depression    Diabetes mellitus    Diabetic retinopathy    DJD  (degenerative joint disease) 11/14/2011   Falls infrequently 01/2010   fracture of pelvis and left humerus   Fibromyalgia    Headaches, cluster    Hyperlipidemia    Hypertension    IBS (irritable bowel syndrome)    diverticulosis, gastroesophageal reflux disease   Lower back pain    Obesity 11/14/2011   Spinal stenosis     Past Surgical History:  Procedure Laterality Date   APPENDECTOMY     CATARACT EXTRACTION, BILATERAL     Lens implants   CENTRAL VENOUS CATHETER TUNNELED INSERTION SINGLE LUMEN     CHOLECYSTECTOMY     COLONOSCOPY  2012   EYE SURGERY  nov 2012   for diabetic retinopathy   LUMBAR DISC SURGERY     lumbosacral spine procedure x 2   MASTECTOMY     Right breast for carcinoma  HPI  from the history and physical done on the day of admission:    PCP: Redmond School, MD  Patient coming from: Nursing home  Chief Complaint: Altered mental status  HPI: [name] is a 85 y.o. female with medical history significant of chronic kidney disease with a baseline creatinine of 1.8, congestive heart failure, obesity, cirrhosis comes in with over a week of worsening swelling in her abdomen and lower extremities.  Her Lasix was recently switched to [facility] due to worsening renal failure.  Patient reports she is swollen much more since this change her medication.  She denies any shortness of breath or any fevers.  Patient be referred for admission for anasarca with volume overload.   Hospital Course:        Brief History: 85 year old female with a history of right-sided inflammatory breast cancer in remission, CKD stage IV, atrial fibrillation, stroke, diabetes mellitus with retinopathy, hypertension, hyperlipidemia, B12 deficiency, frequent falls presenting from [place] with reported fever and altered mental status. Unfortunately, the patient is a poor historian,and attempts to call the family were unsuccessfulinitally.  Nevertheless, the patient was sent to the emergency department for the above complaints. The patient herself stated that she was told that she had confusion and fevers. During evaluation, the patient was noted to be fluid overloaded with anasarca. She was afebrile hemodynamically stable saturating 92-95% room air. WBC was 7.7. She was noted to have serum creatinine 2.57.She was started on intravenous furosemide. Although the exact history is not completely clear, it is also been noted in the medical record that the patient has had worsening lower extremity edema and abdominal wall edema at least for the past week. The patient had a recentadmitto the hospital from 01/03/2019 through 01/08/2019 for increasing generalized weakness and frequent falls. She was diagnosed with a UTI. She was sent to a skilled nursing facility at that time. Since that discharge, the patient has been transition from furosemide to torsemide, but it is unclear exactly when this transition occurred. Family states that we will have everything ready to take patient home on 02/21/2019 with  [facility]    Assessment/Plan: AcutesystolicCHF exacerbation with Anasarca -Treated with IV furosemide, already Transitioned to>>po furosemide -Daily weights--NEG > 6 kg -Fluid balance--NEG > 8L -01/04/2019 echo EF 45-50%, no WMA, elevated RV pressure 58.0. Dilated IVC;moderate TR, mild decreased RV function -Patient had CHF exacerbation clinically and radiologically -transition to oral furosemide per nephrology on 02/18/19  Acute on chronic renal failure--CKD stage IV -Baseline creatinine 1.6-1.8 -Presented with serum creatinine 2.57 -No hydronephrosis noted on CT abdomen and pelvis -Urinalysis bland -renal consult appreciated>>>transition to oral furosemide 3/18 -will need to "tolerate"/accept worse renal function for improved pulm function  Chronic atrial fibrillation -CHADSVASc = 7 (CHF, HTN, Age, Female, DM,  CAD) -Not on anticoagulation secondary to history of frequent falls -Continue metoprolol -Personally reviewed EKG--atrial fibrillation, no ST-T wave changes -continue ASA 81 mg daily -increased metoprolol to 50 mg bid for better rate control  Acute metabolic encephalopathy -no fever noted since admission -B12--295 -TSH--3.249 -Free T4--0.94 -ammonia--14 -grandson (POA) states pt has had gradual cognitive decline over last 4-5 months -A&Ox2 during my exam--probably her baseline ---- Overall improved mentation, per family pretty close to baseline at this time  Diabetes mellitus type 2, uncontrolled with hyperglycemia -Holding glipizide -Hold  Lantus -change to moderate SSI -Modified NovoLog sliding scale -Hemoglobin A1c--8.9 -Holding Januvia Allow some permissive hyperglycemia and giving unreliable and erratic oral intake  Essential hypertension -c/nmetoprolol  tartrate  Hyperlipidemia -Consider stopping Crestor  Fibromyalgia -c/n Lyrica and Paxil  Recent right humerus fracture -Follow-up orthopedics, Dr. Aline Brochure  Goals of Care -DNR -anticipate continued gradualfunctional and cognitive -long discussion with grandson (HPOA)-->  treat the treatable for now -difficult family dynamics -palliative consult appreciated, hospice consult appreciated Patient and family declined transfer to SNF  discharge home with hospice services -    Disposition Plan:home with Hospice  Family Communication:updated grandson (POA) at bedside  Consultants:palliative medicine/hospice services  Code Status: DNR  DVT Prophylaxis: SCDs   Procedures: As Listed in Progress Note Above  Antibiotics: Cephalexin3/12>>>3/17  Discharge Condition: Overall prognosis is poor  Follow UP--- as per hospice   Consults obtained -palliative care and hospice services/nephrologist  Diet and Activity recommendation:  As advised  Discharge Instructions    Discharge  Instructions    Bed rest   Complete by:  As directed    Patient needs frequent turns [every 2 hours] to avoid bedsores   Call MD for:  difficulty breathing, headache or visual disturbances   Complete by:  As directed    Call MD for:  persistant dizziness or light-headedness   Complete by:  As directed    Call MD for:  persistant nausea and vomiting   Complete by:  As directed    Call MD for:  severe uncontrolled pain   Complete by:  As directed    Call MD for:  temperature >100.4   Complete by:  As directed    Diet - low sodium heart healthy   Complete by:  As directed    Discharge instructions   Complete by:  As directed    1)Novolog Insulin---- Per Sliding scale-- CBG < 70: implement hypoglycemia protocol  CBG 70 - 120:    0 units  CBG 121 - 150: 0 units  CBG 151 - 200: 0 units  CBG 201 - 250: 0 units  CBG 251 - 300: 2 units  CBG 301 - 350:           5 units  CBG 351 - 400: 7 units  CBG > 400 call MD   2)Call Hospice Team to help you manage pain/discomfort        Discharge Medications     Allergies as of 02/21/2019      Reactions   Mold Extract [trichophyton Mentagrophyte] Other (See Comments)   Reaction:  Itchy, watery eyes/sneezing    Morphine And Related Other (See Comments)   Reaction:  Drowsiness. "I didn't wake up for almost 1 week" after taking morphine.    Niaspan [niacin] Other (See Comments)   Reaction:  Flushing    Pollen Extract Other (See Comments)   Reaction:  Itchy, watery eyes/sneezing       Medication List    STOP taking these medications   aspirin EC 81 MG tablet   Basaglar KwikPen 100 UNIT/ML Sopn   cephALEXin 500 MG capsule Commonly known as:  KEFLEX   fenofibrate 145 MG tablet Commonly known as:  TRICOR   fluconazole 150 MG tablet Commonly known as:  DIFLUCAN   glipiZIDE 10 MG tablet Commonly known as:  GLUCOTROL   insulin aspart 100 UNIT/ML FlexPen Commonly known as:  NovoLOG FlexPen Replaced by:  insulin aspart 100 UNIT/ML  injection   pregabalin 50 MG capsule Commonly known as:  LYRICA   raloxifene 60 MG tablet Commonly known as:  EVISTA   rosuvastatin 40 MG tablet Commonly known as:  CRESTOR   sitaGLIPtin 25 MG  tablet Commonly known as:  JANUVIA   torsemide 20 MG tablet Commonly known as:  DEMADEX     TAKE these medications   acetaminophen 325 MG tablet Commonly known as:  TYLENOL Take 2 tablets (650 mg total) by mouth every 6 (six) hours as needed for mild pain, fever or headache.   albuterol 108 (90 Base) MCG/ACT inhaler Commonly known as:  ProAir HFA Inhale 2 puffs into the lungs every 6 (six) hours as needed for wheezing or shortness of breath.   bisacodyl 10 MG suppository Commonly known as:  Dulcolax Place 1 suppository (10 mg total) rectally as needed for moderate constipation.   clonazePAM 0.5 MG tablet Commonly known as:  KLONOPIN Take 1 tablet (0.5 mg total) by mouth 2 (two) times daily as needed for anxiety. What changed:  when to take this   esomeprazole 40 MG capsule Commonly known as:  NEXIUM Take 1 capsule (40 mg total) by mouth daily.   furosemide 40 MG tablet Commonly known as:  LASIX Take 1 tablet (40 mg total) by mouth 2 (two) times daily.   HYDROcodone-acetaminophen 10-325 MG tablet Commonly known as:  NORCO Take 1 tablet by mouth every 6 (six) hours as needed for moderate pain. What changed:  when to take this   insulin aspart 100 UNIT/ML injection Commonly known as:  novoLOG Inject 0-15 Units into the skin 3 (three) times daily with meals. Per Sliding scale-- CBG < 70: implement hypoglycemia protocol  CBG 70 - 120:    0 units  CBG 121 - 150: 0 units  CBG 151 - 200: 0 units  CBG 201 - 250: 0 units  CBG 251 - 300: 2 units  CBG 301 - 350:           5 units  CBG 351 - 400: 7 units  CBG > 400 call MD Replaces:  insulin aspart 100 UNIT/ML FlexPen   loperamide 2 MG capsule Commonly known as:  IMODIUM Take 1 capsule (2 mg total) by mouth daily as needed  for diarrhea or loose stools.   metoprolol tartrate 50 MG tablet Commonly known as:  LOPRESSOR Take 1 tablet (50 mg total) by mouth 2 (two) times daily. What changed:    medication strength  how much to take   nystatin powder Commonly known as:  MYCOSTATIN/NYSTOP Apply 1 g topically 3 (three) times daily. Apply to abdominal fold topically every day and evening shift for candidas   PARoxetine 40 MG tablet Commonly known as:  PAXIL Take 40 mg by mouth daily.   potassium chloride SA 20 MEQ tablet Commonly known as:  K-DUR,KLOR-CON Take 20 mEq by mouth daily.   senna-docusate 8.6-50 MG tablet Commonly known as:  Senokot-S Take 2 tablets by mouth 2 (two) times daily.       Major procedures and Radiology Reports - PLEASE review detailed and final reports for all details, in brief -    Ct Abdomen Pelvis Wo Contrast  Result Date: 02/12/2019 CLINICAL DATA:  85 year old female with history of abdominal distension, fever and altered mental status. Increasing falls. EXAM: CT ABDOMEN AND PELVIS WITHOUT CONTRAST TECHNIQUE: Multidetector CT imaging of the abdomen and pelvis was performed following the standard protocol without IV contrast. COMPARISON:  CT the abdomen and pelvis 11/10/2016. FINDINGS: Lower chest: Cardiomegaly. Atherosclerotic calcifications in the descending thoracic aorta as well as the left anterior descending, left circumflex and right coronary arteries. Calcifications of the aortic valve and mitral annulus. Moderate left pleural effusion. Areas of  passive atelectasis are noted in the left lung base. Edema throughout the lower left chest wall and visualized portions of the left breast. Hepatobiliary: No definite suspicious cystic or solid hepatic lesions are confidently identified on today's noncontrast CT examination. Status post cholecystectomy. Pancreas: Pancreatic atrophy. No definite pancreatic mass or peripancreatic fluid or inflammatory changes. Spleen: Unremarkable.  Adrenals/Urinary Tract: 2.3 cm low-attenuation lesion in the medial aspect of the upper pole of the left kidney, incompletely characterized on today's noncontrast CT examination, but statistically likely a cyst. Unenhanced appearance of the right kidney is normal. No hydroureteronephrosis. Urinary bladder is unremarkable. Stomach/Bowel: Normal appearance of the stomach. No pathologic dilatation of small bowel or colon. Numerous colonic diverticulae are noted. Due to surrounding ascites, accurate assessment for focal inflammation indicative of acute diverticulitis is not possible on today's examination, but no focal inflammatory changes are confidently identified given these limitations. Appendix is not confidently identified may be surgically absent. Vascular/Lymphatic: Aortic atherosclerosis. No lymphadenopathy noted in the abdomen or pelvis. Reproductive: Status post hysterectomy. Ovaries are not confidently identified may be surgically absent or atrophic. Other: Small volume of ascites. No pneumoperitoneum. Diffuse body wall edema. Musculoskeletal: Status post PLIF from L3-L5 with interbody grafts at L3-L4 and L4-L5. There are no aggressive appearing lytic or blastic lesions noted in the visualized portions of the skeleton. IMPRESSION: 1. Small volume of ascites, left pleural effusion and diffuse body wall edema; imaging findings suggestive of a state of anasarca. 2. No other acute findings are confidently identified in the abdomen or pelvis to account for the patient's symptoms. 3. Cardiomegaly. 4. Aortic atherosclerosis, in addition to least 3 vessel coronary artery disease. 5. There are calcifications of the aortic valve and mitral annulus. Echocardiographic correlation for evaluation of potential valvular dysfunction may be warranted if clinically indicated. 6. Colonic diverticulosis. Electronically Signed   By: Vinnie Langton M.D.   On: 02/12/2019 16:50   Dg Chest 1 View  Result Date:  02/12/2019 CLINICAL DATA:  Acute presentation with weakness. EXAM: CHEST  1 VIEW COMPARISON:  01/03/2019 FINDINGS: Artifact overlies the chest. Left Port-A-Cath tip in the SVC above the right atrium. Right lung remains clear. There is worsened infiltrate and volume loss in the left lower lobe. There is probably a left effusion as well. IMPRESSION: Worsening of disease on the left. Probable left lower lobe pneumonia and collapse. Suspected left effusion. Electronically Signed   By: Nelson Chimes M.D.   On: 02/12/2019 16:33   Dg Shoulder Right  Result Date: 02/11/2019 CLINICAL DATA:  Right shoulder pain after a fall tonight EXAM: RIGHT SHOULDER - 2+ VIEW COMPARISON:  None. FINDINGS: Mildly comminuted fracture of the humeral shaft with mild angulation. A sharp fracture fragment is present anteriorly and medially. Osteopenic appearance. Located glenohumeral and acromioclavicular joints. IMPRESSION: 1. Humeral shaft fracture as described on dedicated exam. 2. Located shoulder. Electronically Signed   By: Monte Fantasia M.D.   On: 02/11/2019 05:02   Dg Humerus Right  Result Date: 02/11/2019 CLINICAL DATA:  Fall with right arm pain EXAM: RIGHT HUMERUS - 2+ VIEW COMPARISON:  None. FINDINGS: Mildly comminuted fracture of the mid humeral shaft with apex ventral and medial angulation. A sharp bone fragment is present medially. Located glenohumeral and acromioclavicular joints. IMPRESSION: Comminuted fracture of the humeral shaft with fracture likely extending to the surgical neck. A sharp bone fragment is present medially. Electronically Signed   By: Monte Fantasia M.D.   On: 02/11/2019 05:03    Micro Results  Recent Results (from the past 240 hour(s))  MRSA PCR Screening     Status: Abnormal   Collection Time: 02/12/19  8:40 PM  Result Value Ref Range Status   MRSA by PCR POSITIVE (A) NEGATIVE Final    Comment:        The GeneXpert MRSA Assay (FDA approved for NASAL specimens only), is one component  of a comprehensive MRSA colonization surveillance program. It is not intended to diagnose MRSA infection nor to guide or monitor treatment for MRSA infections. RESULT CALLED TO, READ BACK BY AND VERIFIED WITH: TATE,R ON 02/12/19 AT 2300 BY LOY,C Performed at [facility], [address] [place], [place] [address]   Respiratory Panel by PCR     Status: None   Collection Time: 02/13/19  9:11 AM  Result Value Ref Range Status   Adenovirus NOT DETECTED NOT DETECTED Final   Coronavirus 229E NOT DETECTED NOT DETECTED Final    Comment: (NOTE) The Coronavirus on the Respiratory Panel, DOES NOT test for the novel  Coronavirus (2019 nCoV)    Coronavirus HKU1 NOT DETECTED NOT DETECTED Final   Coronavirus NL63 NOT DETECTED NOT DETECTED Final   Coronavirus OC43 NOT DETECTED NOT DETECTED Final   Metapneumovirus NOT DETECTED NOT DETECTED Final   Rhinovirus / Enterovirus NOT DETECTED NOT DETECTED Final   Influenza A NOT DETECTED NOT DETECTED Final   Influenza B NOT DETECTED NOT DETECTED Final   Parainfluenza Virus 1 NOT DETECTED NOT DETECTED Final   Parainfluenza Virus 2 NOT DETECTED NOT DETECTED Final   Parainfluenza Virus 3 NOT DETECTED NOT DETECTED Final   Parainfluenza Virus 4 NOT DETECTED NOT DETECTED Final   Respiratory Syncytial Virus NOT DETECTED NOT DETECTED Final   Bordetella pertussis NOT DETECTED NOT DETECTED Final   Chlamydophila pneumoniae NOT DETECTED NOT DETECTED Final   Mycoplasma pneumoniae NOT DETECTED NOT DETECTED Final    Comment: Performed at [facility], [address], [place], [place] [address]       Today   Subjective    Shruthi Northrup today has no new complaints, eager to go home family with hospice services          Patient has been seen and examined prior to discharge   Objective   Blood pressure (!) 141/76, pulse 91, temperature 98.5 F (36.9 C), temperature source Oral, resp. rate 20, height 5\' 11"  (1.803 m), weight 111.5 kg, SpO2 94  %.   Intake/Output Summary (Last 24 hours) at 02/21/2019 0931 Last data filed at 02/21/2019 0500 Gross per 24 hour  Intake --  Output 900 ml  Net -900 ml    Exam  Gen:- Awake Alert, resting comfortably HEENT:- [place].AT, No sclera icterus Neck-Supple Neck,No JVD,.  Lungs-   fair air movement, no adventitious sounds CV- S1, S2 normal Abd-  +ve B.Sounds, Abd Soft, No tenderness,    Extremity/Skin:- No  edema,    Psych-affect is appropriate, oriented x3 Neuro-no new focal deficits, no tremors    Data Review   CBC w Diff:  Lab Results  Component Value Date   WBC 8.8 02/16/2019   HGB 9.7 (L) 02/16/2019   HCT 35.0 (L) 02/16/2019   PLT 193 02/16/2019   LYMPHOPCT 17 02/12/2019   MONOPCT 11 02/12/2019   EOSPCT 1 02/12/2019   BASOPCT 0 02/12/2019    CMP:  Lab Results  Component Value Date   NA 141 02/19/2019   K 3.5 02/19/2019   CL 101 02/19/2019   CO2 31 02/19/2019  BUN 58 (H) 02/19/2019   CREATININE 2.12 (H) 02/19/2019   CREATININE 1.36 (H) 07/26/2011   PROT 5.8 (L) 02/12/2019   ALBUMIN 2.8 (L) 02/12/2019   BILITOT 0.3 02/12/2019   ALKPHOS 29 (L) 02/12/2019   AST 21 02/12/2019   ALT 12 02/12/2019  .   Total Discharge time is about 33 minutes  Roxan Hockey M.D on 02/21/2019 at 9:31 AM  Go to [email or web address] -  for contact info  [facility] - Office  [phone]

## 2019-02-23 ENCOUNTER — Telehealth: Payer: Self-pay | Admitting: Orthopedic Surgery

## 2019-02-23 NOTE — Telephone Encounter (Signed)
I can not give information to a patient we have never treated. You can advise her to ask hospice provider any questions she has

## 2019-02-23 NOTE — Telephone Encounter (Signed)
[name] was originally scheduled with this office for fx humerus on 02/16/19 but appointment was canceled due to lockdown at [facility] which was where the patient was staying.  Appointment was then rescheduled for 03/06/19. [name] was then admitted to [facility].  Per her daughter, [name], [name] is now home with hospice.   She just has a couple of questions that she would like help with.

## 2019-02-24 ENCOUNTER — Telehealth: Payer: Self-pay | Admitting: Orthopedic Surgery

## 2019-02-24 NOTE — Telephone Encounter (Signed)
I have advised pt's daughter of the message that was sent from the clinical staff.  She understood

## 2019-03-03 ENCOUNTER — Telehealth: Payer: Self-pay | Admitting: Orthopedic Surgery

## 2019-03-03 NOTE — Telephone Encounter (Signed)
Patient's daughter [name] came by the office today.  She said that she and the hospice nurse had to take the splint off because it was causing "grooves" into the skin and the stocking underneath had gotten a little bloody.  She wanted to know about getting some "supplies" for the splint to help make it more comfortable.   She said she would check with [place] for that.  She then wanted to know what to do to help [name] be more comfortable with the splint.    I asked her if there were anyway to get [name] to the office and she said there wasn't.  She said she is completely home bound now.  I told her that I would speak to the clinical staff to see if they had any further suggestions.

## 2019-03-03 NOTE — Telephone Encounter (Signed)
FYI only, you have not seen patient, unless you think you may be able to offer  Virtual visit

## 2019-03-04 NOTE — Telephone Encounter (Signed)
SHE HAS A HUMERAL SHAFT FRACTURE AND WOULD NEED A HUMERAL FRACTURE CUFF   CLINICAL DATA:  Fall with right arm pain   EXAM: RIGHT HUMERUS - 2+ VIEW   COMPARISON:  None.   FINDINGS: Mildly comminuted fracture of the mid humeral shaft with apex ventral and medial angulation. A sharp bone fragment is present medially. Located glenohumeral and acromioclavicular joints.   IMPRESSION: Comminuted fracture of the humeral shaft with fracture likely extending to the surgical neck. A sharp bone fragment is present medially.     Electronically Signed   By: Monte Fantasia M.D.   On: 02/11/2019 05:03

## 2019-03-04 NOTE — Telephone Encounter (Signed)
I have discussed with her daughter and she voiced understanding. Her daughter will discuss with hospice care and see what they can do about getting her a brace.

## 2019-03-04 NOTE — Telephone Encounter (Signed)
Left message for [name] to call me back Patient needs humeral fracture brace, but this is an item she must be fitted for   Options are for her to ask hospice doctor if they can fit her for the item, or make appointment to come here for visit with Dr Aline Brochure and fitting for humeral brace.

## 2019-03-06 ENCOUNTER — Ambulatory Visit: Payer: Self-pay | Admitting: Orthopedic Surgery

## 2019-03-24 ENCOUNTER — Ambulatory Visit (HOSPITAL_COMMUNITY): Payer: Self-pay | Admitting: Nurse Practitioner

## 2019-04-30 ENCOUNTER — Encounter (HOSPITAL_COMMUNITY): Payer: Self-pay | Admitting: Oncology

## 2019-05-04 DEATH — deceased

## 2020-04-12 NOTE — Telephone Encounter (Signed)
Error
